# Patient Record
Sex: Female | Born: 1935 | Race: Black or African American | Hispanic: No | Smoking: Former smoker
Health system: Southern US, Community
[De-identification: ages and names within clinical notes are randomized; demographics above are authoritative.]

## PROBLEM LIST (undated history)

## (undated) DIAGNOSIS — I509 Heart failure, unspecified: Secondary | ICD-10-CM

## (undated) DIAGNOSIS — Z7409 Other reduced mobility: Secondary | ICD-10-CM

## (undated) DIAGNOSIS — I4891 Unspecified atrial fibrillation: Secondary | ICD-10-CM

## (undated) DIAGNOSIS — K56609 Unspecified intestinal obstruction, unspecified as to partial versus complete obstruction: Secondary | ICD-10-CM

## (undated) DIAGNOSIS — I350 Nonrheumatic aortic (valve) stenosis: Secondary | ICD-10-CM

## (undated) DIAGNOSIS — D649 Anemia, unspecified: Secondary | ICD-10-CM

## (undated) DIAGNOSIS — I10 Essential (primary) hypertension: Secondary | ICD-10-CM

## (undated) DIAGNOSIS — Z973 Presence of spectacles and contact lenses: Secondary | ICD-10-CM

## (undated) DIAGNOSIS — I2510 Atherosclerotic heart disease of native coronary artery without angina pectoris: Secondary | ICD-10-CM

## (undated) DIAGNOSIS — E119 Type 2 diabetes mellitus without complications: Secondary | ICD-10-CM

## (undated) DIAGNOSIS — I209 Angina pectoris, unspecified: Secondary | ICD-10-CM

## (undated) DIAGNOSIS — Z8719 Personal history of other diseases of the digestive system: Secondary | ICD-10-CM

## (undated) DIAGNOSIS — I639 Cerebral infarction, unspecified: Secondary | ICD-10-CM

## (undated) DIAGNOSIS — Z972 Presence of dental prosthetic device (complete) (partial): Secondary | ICD-10-CM

## (undated) DIAGNOSIS — I6529 Occlusion and stenosis of unspecified carotid artery: Secondary | ICD-10-CM

## (undated) DIAGNOSIS — N189 Chronic kidney disease, unspecified: Secondary | ICD-10-CM

## (undated) DIAGNOSIS — K08109 Complete loss of teeth, unspecified cause, unspecified class: Secondary | ICD-10-CM

## (undated) HISTORY — PX: CHOLECYSTECTOMY: SHX55

## (undated) HISTORY — DX: Occlusion and stenosis of unspecified carotid artery: I65.29

## (undated) HISTORY — PX: TUBAL LIGATION: SHX77

## (undated) HISTORY — PX: BREAST EXCISIONAL BIOPSY: SUR124

## (undated) HISTORY — DX: Anemia, unspecified: D64.9

## (undated) HISTORY — DX: Essential (primary) hypertension: I10

## (undated) HISTORY — PX: ABDOMINAL HYSTERECTOMY: SHX81

## (undated) HISTORY — PX: EYE SURGERY: SHX253

## (undated) HISTORY — PX: CATARACT EXTRACTION: SUR2

## (undated) HISTORY — PX: DILATION AND CURETTAGE OF UTERUS: SHX78

## (undated) HISTORY — PX: APPENDECTOMY: SHX54

## (undated) HISTORY — PX: OTHER SURGICAL HISTORY: SHX169

## (undated) HISTORY — DX: Heart failure, unspecified: I50.9

## (undated) HISTORY — PX: BREAST SURGERY: SHX581

## (undated) HISTORY — DX: Atherosclerotic heart disease of native coronary artery without angina pectoris: I25.10

## (undated) HISTORY — DX: Unspecified atrial fibrillation: I48.91

## (undated) HISTORY — DX: Type 2 diabetes mellitus without complications: E11.9

## (undated) HISTORY — DX: Cerebral infarction, unspecified: I63.9

## (undated) NOTE — *Deleted (*Deleted)
Neurology Consultation Reason for Consult: Dizziness  Requesting Physician: Cherlynn Perches  CC: Dizziness   History is obtained from: Patient and chart review  HPI: Katherine [name] is a 4 y.o. female with a past medical history significant for atrial fibrillation not on anticoagulation, type 2 diabetes, hypertension, hyperlipidemia, obesity, prior stroke with residual right-sided weakness, coronary artery disease, congestive heart failure, aortic stenosis, chronic kidney disease, former tobacco use  She had a recent admission for biopsy of a bladder lesion seen on cystoscopy with fulguration of the abnormal bladder mucosa on 01/24/2020  LKW: *** tPA given?: No, or if yes, time given *** IA performed?: No, or if yes, groin puncture time: *** Premorbid modified rankin scale: ***     0 - No symptoms.     1 - No significant disability. Able to carry out all usual activities, despite some symptoms.     2 - Slight disability. Able to look after own affairs without assistance, but unable to carry out all previous activities.     3 - Moderate disability. Requires some help, but able to walk unassisted.     4 - Moderately severe disability. Unable to attend to own bodily needs without assistance, and unable to walk unassisted.     5 - Severe disability. Requires constant nursing care and attention, bedridden, incontinent.     6 - Dead. ICH Score: ***  Time performed: *** GCS: {Blank single:19197::"3-4 is 2 points","5-12 is 1 point","13-15 is 0 points"} Infratentorial: {yes no:314532}. If yes, 1 point Volume: {Blank single:19197::">30cc is 1 point","<30cc is 0 points"}  Age: 89 y.o.. >80 is 1 point Intraventricular extension is 1 point  Score:***  A Score of {Blank single:19197::"0 points has a 30 day mortality of 0%","1 points has a 30 day mortality of 13%","2 points has a 30 day mortality of 26%","3 points has a 30 day mortality of 72%","4 points has a 30 day mortality of 97%","5-6 points has a 30  day mortality of 100%"}. Stroke. 2001 Apr;32(4):891-7.    ROS: A 14 point ROS was performed and is negative except as noted in the HPI. *** Unable to obtain due to altered mental status.   Past Medical History:  Diagnosis Date  . Anemia    mild  . Anginal pain (HCC)   . Aortic stenosis   . Atrial fibrillation (HCC)   . Bowel obstruction (HCC) 2015   PSBO  . Bowel obstruction (HCC) 10/2017  . CAD (coronary artery disease)   . Carotid artery stenosis   . CHF (congestive heart failure) (HCC)   . Chronic kidney disease    renal insufficiency - DR Lowell Guitar yearly  . Diabetes mellitus without complication (HCC)    type 2  . Full dentures   . History of hiatal hernia   . Hypertension   . Mobility impaired    uses walker for ambulation  . Stroke [facility])    right sided weakness,walker  . Wears glasses    Past Surgical History:  Procedure Laterality Date  . ABDOMINAL HYSTERECTOMY    . angiodysplasia    . APPENDECTOMY    . BREAST DUCTAL SYSTEM EXCISION  03/02/2012   Procedure: EXCISION DUCTAL SYSTEM BREAST;  Surgeon: Ernestene Mention, MD;  Location: [facility];  Service: General;  Laterality: Left;  . BREAST EXCISIONAL BIOPSY    . BREAST SURGERY     bx rt and lt   . CATARACT EXTRACTION     left  . CHOLECYSTECTOMY    .  COLONOSCOPY N/A 10/15/2012   Procedure: COLONOSCOPY;  Surgeon: Theda Belfast, MD;  Location: [facility];  Service: Endoscopy;  Laterality: N/A;  . CYSTOSCOPY WITH BIOPSY N/A 01/24/2020   Procedure: CYSTOSCOPY WITH BLADDER BIOPSY AND FULGERATION;  Surgeon: Noel Christmas, MD;  Location: [facility];  Service: Urology;  Laterality: N/A;  . DILATION AND CURETTAGE OF UTERUS    . DVT    . ENTEROSCOPY N/A 11/17/2014   Procedure: ENTEROSCOPY;  Surgeon: Jeani Hawking, MD;  Location: [facility];  Service: Endoscopy;  Laterality: N/A;  . ESOPHAGOGASTRODUODENOSCOPY N/A 09/10/2012   Procedure: ESOPHAGOGASTRODUODENOSCOPY (EGD);  Surgeon: Theda Belfast, MD;   Location: [facility];  Service: Endoscopy;  Laterality: N/A;  . EYE SURGERY     right-glaucoma  . HOT HEMOSTASIS N/A 09/10/2012   Procedure: HOT HEMOSTASIS (ARGON PLASMA COAGULATION/BICAP);  Surgeon: Theda Belfast, MD;  Location: [facility];  Service: Endoscopy;  Laterality: N/A;  . HOT HEMOSTASIS N/A 11/17/2014   Procedure: HOT HEMOSTASIS (ARGON PLASMA COAGULATION/BICAP);  Surgeon: Jeani Hawking, MD;  Location: [facility];  Service: Endoscopy;  Laterality: N/A;  . TUBAL LIGATION     Current Outpatient Medications  Medication Instructions  . Accu-Chek FastClix Lancets MISC Daily  . ACCU-CHEK GUIDE test strip 1 strip, Daily  . amLODipine-valsartan (EXFORGE) 5-320 MG tablet 1 tablet, Oral, Daily  . atorvastatin (LIPITOR) 40 mg, Oral, Daily  . bimatoprost (LUMIGAN) 0.01 % SOLN 1 drop, Both Eyes, Daily at bedtime  . Blood Glucose Monitoring Suppl (ACCU-CHEK GUIDE) w/Device KIT Daily  . brimonidine (ALPHAGAN P) 0.1 % SOLN 1 drop, Both Eyes, 3 times daily  . cephALEXin (KEFLEX) 250 mg, Oral, Daily at bedtime  . clopidogrel (PLAVIX) 75 mg, Oral, Daily  . dicyclomine (BENTYL) 10 mg, Oral, 3 times daily  . dorzolamide (TRUSOPT) 2 % ophthalmic solution 1 drop, Both Eyes, 3 times daily  . ergocalciferol (VITAMIN D2) 50,000 Units, Oral, Every Sat  . fentaNYL (DURAGESIC) 12 MCG/HR 1 patch, Transdermal, every 72 hours  . ferrous gluconate (FERGON) 324 mg, Oral, Daily  . insulin aspart protamine- aspart (NOVOLOG MIX 70/30) (70-30) 100 UNIT/ML injection 20 Units, Subcutaneous, 2 times daily with meals, Inject 20 units subcutaneously every morning and 10 units at night  . nitroGLYCERIN (NITRODUR - DOSED IN MG/24 HR) 0.4 mg, Transdermal, Daily  . nitroGLYCERIN (NITROSTAT) 0.4 mg, Sublingual, Every 5 min PRN  . omeprazole (PRILOSEC) 40 mg, Oral, Daily  . potassium chloride SA (K-DUR,KLOR-CON) 20 MEQ tablet 10 mEq, Oral, Daily  . torsemide (DEMADEX) 20 mg, Oral, Daily     Family History  Problem  Relation Age of Onset  . Heart disease Sister   . Heart disease Brother   . Heart disease Mother   . Heart disease Father   . Heart disease Daughter    ***  Social History:  reports that she quit smoking about 17 years ago. She has never used smokeless tobacco. She reports that she does not drink alcohol and does not use drugs. ***  Exam: Current vital signs: BP (!) 153/56 (BP Location: Left Arm)   Pulse 62   Temp 98.4 F (36.9 C) (Oral)   Resp 14   SpO2 96%  Vital signs in last 24 hours: Temp:  [98.3 F (36.8 C)-98.4 F (36.9 C)] 98.4 F (36.9 C) (12/01 1903) Pulse Rate:  [57-63] 62 (12/01 2042) Resp:  [14-20] 14 (12/01 2042) BP: (124-161)/(55-79) 153/56 (12/01 2042) SpO2:  [96 %-100 %] 96 % (12/01 2042)   Physical Exam  Constitutional: Appears well-developed and well-nourished.  Psych: Affect appropriate to situation Eyes: No scleral injection HENT: No OP obstrucion MSK: no joint deformities.  Cardiovascular: Normal rate and regular rhythm.  Respiratory: Effort normal, non-labored breathing GI: Soft.  No distension. There is no tenderness.  Skin: WDI  Neuro: Mental Status: Patient is awake, alert, oriented to person, place, month, year, and situation.*** Patient is able to give a clear and coherent history.*** No signs of aphasia or neglect*** Cranial Nerves: II: Visual Fields are full. Pupils are equal, round, and reactive to light.  *** III,IV, VI: EOMI without ptosis or diploplia.  V: Facial sensation is symmetric to temperature VII: Facial movement is symmetric.  VIII: hearing is intact to voice X: Uvula elevates symmetrically XI: Shoulder shrug is symmetric. XII: tongue is midline without atrophy or fasciculations.  Motor: Tone is normal. Bulk is normal. 5/5 strength was present in all four extremities. *** Sensory: Sensation is symmetric to light touch and temperature in the arms and legs.*** Deep Tendon Reflexes: 2+ and symmetric in the biceps and  patellae. *** Plantars: Toes are downgoing bilaterally. *** Cerebellar: FNF and HKS are intact bilaterally***  NIHSS total *** Score breakdown: ***    I have reviewed labs in epic and the results pertinent to this consultation are: ***  I have reviewed the images obtained:***   Impression: ***  Recommendations: - ***   Brooke Dare MD-PhD [facility] [phone]

---

## 1997-06-21 ENCOUNTER — Other Ambulatory Visit: Admission: RE | Admit: 1997-06-21 | Discharge: 1997-06-21 | Payer: Self-pay | Admitting: Cardiology

## 1997-07-04 ENCOUNTER — Inpatient Hospital Stay (HOSPITAL_COMMUNITY): Admission: AD | Admit: 1997-07-04 | Discharge: 1997-07-07 | Payer: Self-pay | Admitting: Urology

## 1997-08-22 ENCOUNTER — Ambulatory Visit (HOSPITAL_COMMUNITY): Admission: RE | Admit: 1997-08-22 | Discharge: 1997-08-22 | Payer: Self-pay | Admitting: Gastroenterology

## 1997-08-24 ENCOUNTER — Other Ambulatory Visit: Admission: RE | Admit: 1997-08-24 | Discharge: 1997-08-24 | Payer: Self-pay | Admitting: Cardiology

## 1997-08-27 ENCOUNTER — Inpatient Hospital Stay (HOSPITAL_COMMUNITY): Admission: EM | Admit: 1997-08-27 | Discharge: 1997-08-29 | Payer: Self-pay | Admitting: Emergency Medicine

## 1997-09-04 ENCOUNTER — Other Ambulatory Visit: Admission: RE | Admit: 1997-09-04 | Discharge: 1997-09-04 | Payer: Self-pay | Admitting: Cardiology

## 1997-10-22 ENCOUNTER — Inpatient Hospital Stay (HOSPITAL_COMMUNITY): Admission: EM | Admit: 1997-10-22 | Discharge: 1997-10-26 | Payer: Self-pay | Admitting: Emergency Medicine

## 1997-10-22 ENCOUNTER — Encounter: Payer: Self-pay | Admitting: Emergency Medicine

## 1997-11-13 ENCOUNTER — Other Ambulatory Visit: Admission: RE | Admit: 1997-11-13 | Discharge: 1997-11-13 | Payer: Self-pay | Admitting: Obstetrics

## 1997-11-28 ENCOUNTER — Encounter: Payer: Self-pay | Admitting: Urology

## 1997-11-30 ENCOUNTER — Ambulatory Visit (HOSPITAL_COMMUNITY): Admission: RE | Admit: 1997-11-30 | Discharge: 1997-11-30 | Payer: Self-pay | Admitting: Urology

## 1998-02-15 ENCOUNTER — Ambulatory Visit (HOSPITAL_COMMUNITY): Admission: RE | Admit: 1998-02-15 | Discharge: 1998-02-15 | Payer: Self-pay | Admitting: Cardiology

## 1998-02-15 ENCOUNTER — Encounter: Payer: Self-pay | Admitting: Cardiology

## 1998-02-28 ENCOUNTER — Encounter: Payer: Self-pay | Admitting: Emergency Medicine

## 1998-03-01 ENCOUNTER — Encounter: Payer: Self-pay | Admitting: Emergency Medicine

## 1998-03-01 ENCOUNTER — Inpatient Hospital Stay (HOSPITAL_COMMUNITY): Admission: EM | Admit: 1998-03-01 | Discharge: 1998-03-09 | Payer: Self-pay | Admitting: Emergency Medicine

## 1998-03-02 ENCOUNTER — Encounter: Payer: Self-pay | Admitting: Cardiology

## 1998-03-02 ENCOUNTER — Encounter: Payer: Self-pay | Admitting: *Deleted

## 1998-03-07 ENCOUNTER — Encounter: Payer: Self-pay | Admitting: Emergency Medicine

## 1998-04-19 ENCOUNTER — Ambulatory Visit (HOSPITAL_COMMUNITY): Admission: RE | Admit: 1998-04-19 | Discharge: 1998-04-19 | Payer: Self-pay | Admitting: Urology

## 1998-06-02 ENCOUNTER — Encounter: Payer: Self-pay | Admitting: Emergency Medicine

## 1998-06-02 ENCOUNTER — Inpatient Hospital Stay (HOSPITAL_COMMUNITY): Admission: EM | Admit: 1998-06-02 | Discharge: 1998-06-06 | Payer: Self-pay | Admitting: Emergency Medicine

## 1998-06-03 ENCOUNTER — Encounter (HOSPITAL_BASED_OUTPATIENT_CLINIC_OR_DEPARTMENT_OTHER): Payer: Self-pay | Admitting: General Surgery

## 1998-06-06 ENCOUNTER — Encounter (HOSPITAL_BASED_OUTPATIENT_CLINIC_OR_DEPARTMENT_OTHER): Payer: Self-pay | Admitting: General Surgery

## 1998-07-11 ENCOUNTER — Ambulatory Visit (HOSPITAL_COMMUNITY): Admission: RE | Admit: 1998-07-11 | Discharge: 1998-07-11 | Payer: Self-pay | Admitting: *Deleted

## 1998-07-12 ENCOUNTER — Ambulatory Visit (HOSPITAL_COMMUNITY): Admission: RE | Admit: 1998-07-12 | Discharge: 1998-07-12 | Payer: Self-pay | Admitting: *Deleted

## 1998-08-02 ENCOUNTER — Ambulatory Visit (HOSPITAL_COMMUNITY): Admission: RE | Admit: 1998-08-02 | Discharge: 1998-08-02 | Payer: Self-pay | Admitting: Urology

## 1998-09-25 ENCOUNTER — Inpatient Hospital Stay (HOSPITAL_COMMUNITY): Admission: EM | Admit: 1998-09-25 | Discharge: 1998-09-30 | Payer: Self-pay | Admitting: Emergency Medicine

## 1998-09-25 ENCOUNTER — Encounter: Payer: Self-pay | Admitting: Emergency Medicine

## 1998-09-25 ENCOUNTER — Encounter (HOSPITAL_COMMUNITY): Admission: RE | Admit: 1998-09-25 | Discharge: 1998-10-01 | Payer: Self-pay | Admitting: Cardiology

## 1998-09-26 ENCOUNTER — Encounter: Payer: Self-pay | Admitting: Emergency Medicine

## 1998-09-26 ENCOUNTER — Encounter: Payer: Self-pay | Admitting: Cardiology

## 1998-10-15 ENCOUNTER — Encounter (HOSPITAL_COMMUNITY): Admission: RE | Admit: 1998-10-15 | Discharge: 1999-01-13 | Payer: Self-pay | Admitting: Cardiology

## 1998-12-07 ENCOUNTER — Encounter: Payer: Self-pay | Admitting: Emergency Medicine

## 1998-12-07 ENCOUNTER — Inpatient Hospital Stay (HOSPITAL_COMMUNITY): Admission: EM | Admit: 1998-12-07 | Discharge: 1998-12-10 | Payer: Self-pay | Admitting: Emergency Medicine

## 1998-12-09 ENCOUNTER — Encounter: Payer: Self-pay | Admitting: Cardiology

## 1999-01-16 ENCOUNTER — Encounter (HOSPITAL_COMMUNITY): Admission: RE | Admit: 1999-01-16 | Discharge: 1999-04-16 | Payer: Self-pay | Admitting: Cardiology

## 1999-01-16 ENCOUNTER — Other Ambulatory Visit: Admission: RE | Admit: 1999-01-16 | Discharge: 1999-01-16 | Payer: Self-pay | Admitting: Obstetrics

## 1999-01-18 ENCOUNTER — Encounter: Payer: Self-pay | Admitting: Obstetrics

## 1999-01-18 ENCOUNTER — Ambulatory Visit (HOSPITAL_COMMUNITY): Admission: RE | Admit: 1999-01-18 | Discharge: 1999-01-18 | Payer: Self-pay | Admitting: Obstetrics

## 1999-01-31 ENCOUNTER — Ambulatory Visit (HOSPITAL_COMMUNITY): Admission: RE | Admit: 1999-01-31 | Discharge: 1999-01-31 | Payer: Self-pay | Admitting: Urology

## 1999-02-12 ENCOUNTER — Ambulatory Visit (HOSPITAL_COMMUNITY): Admission: RE | Admit: 1999-02-12 | Discharge: 1999-02-12 | Payer: Self-pay | Admitting: Cardiology

## 1999-02-12 ENCOUNTER — Encounter: Payer: Self-pay | Admitting: Cardiology

## 1999-03-26 ENCOUNTER — Emergency Department (HOSPITAL_COMMUNITY): Admission: EM | Admit: 1999-03-26 | Discharge: 1999-03-26 | Payer: Self-pay | Admitting: Emergency Medicine

## 1999-05-23 ENCOUNTER — Encounter (HOSPITAL_COMMUNITY): Admission: RE | Admit: 1999-05-23 | Discharge: 1999-08-21 | Payer: Self-pay | Admitting: Cardiology

## 1999-05-29 ENCOUNTER — Ambulatory Visit (HOSPITAL_COMMUNITY): Admission: RE | Admit: 1999-05-29 | Discharge: 1999-05-29 | Payer: Self-pay | Admitting: Urology

## 1999-06-17 ENCOUNTER — Encounter: Payer: Self-pay | Admitting: Cardiology

## 1999-06-17 ENCOUNTER — Ambulatory Visit (HOSPITAL_COMMUNITY): Admission: RE | Admit: 1999-06-17 | Discharge: 1999-06-17 | Payer: Self-pay | Admitting: Cardiology

## 1999-07-13 ENCOUNTER — Ambulatory Visit (HOSPITAL_COMMUNITY): Admission: RE | Admit: 1999-07-13 | Discharge: 1999-07-13 | Payer: Self-pay | Admitting: Neurology

## 1999-07-13 ENCOUNTER — Encounter: Payer: Self-pay | Admitting: Neurology

## 1999-07-16 ENCOUNTER — Ambulatory Visit (HOSPITAL_COMMUNITY): Admission: RE | Admit: 1999-07-16 | Discharge: 1999-07-16 | Payer: Self-pay | Admitting: Neurology

## 1999-08-22 ENCOUNTER — Encounter (HOSPITAL_COMMUNITY): Admission: RE | Admit: 1999-08-22 | Discharge: 1999-11-20 | Payer: Self-pay | Admitting: Neurology

## 1999-10-11 ENCOUNTER — Encounter: Payer: Self-pay | Admitting: Urology

## 1999-10-16 ENCOUNTER — Ambulatory Visit (HOSPITAL_COMMUNITY): Admission: RE | Admit: 1999-10-16 | Discharge: 1999-10-16 | Payer: Self-pay | Admitting: Urology

## 1999-11-05 ENCOUNTER — Emergency Department (HOSPITAL_COMMUNITY): Admission: EM | Admit: 1999-11-05 | Discharge: 1999-11-05 | Payer: Self-pay | Admitting: *Deleted

## 1999-12-08 ENCOUNTER — Encounter: Payer: Self-pay | Admitting: Emergency Medicine

## 1999-12-08 ENCOUNTER — Emergency Department (HOSPITAL_COMMUNITY): Admission: EM | Admit: 1999-12-08 | Discharge: 1999-12-08 | Payer: Self-pay | Admitting: Emergency Medicine

## 2000-01-27 ENCOUNTER — Encounter (HOSPITAL_COMMUNITY): Admission: RE | Admit: 2000-01-27 | Discharge: 2000-04-26 | Payer: Self-pay | Admitting: Neurology

## 2000-02-17 ENCOUNTER — Ambulatory Visit (HOSPITAL_COMMUNITY): Admission: RE | Admit: 2000-02-17 | Discharge: 2000-02-17 | Payer: Self-pay | Admitting: Cardiology

## 2000-02-17 ENCOUNTER — Encounter: Payer: Self-pay | Admitting: Cardiology

## 2000-05-19 ENCOUNTER — Encounter (HOSPITAL_COMMUNITY): Admission: RE | Admit: 2000-05-19 | Discharge: 2000-08-17 | Payer: Self-pay | Admitting: Neurology

## 2000-06-02 ENCOUNTER — Ambulatory Visit (HOSPITAL_COMMUNITY): Admission: RE | Admit: 2000-06-02 | Discharge: 2000-06-02 | Payer: Self-pay | Admitting: Cardiology

## 2000-06-02 ENCOUNTER — Encounter: Payer: Self-pay | Admitting: Cardiology

## 2000-06-11 ENCOUNTER — Inpatient Hospital Stay (HOSPITAL_COMMUNITY): Admission: EM | Admit: 2000-06-11 | Discharge: 2000-06-13 | Payer: Self-pay | Admitting: Emergency Medicine

## 2000-06-11 ENCOUNTER — Encounter: Payer: Self-pay | Admitting: Emergency Medicine

## 2000-06-12 ENCOUNTER — Encounter: Payer: Self-pay | Admitting: Cardiology

## 2000-06-19 ENCOUNTER — Ambulatory Visit (HOSPITAL_COMMUNITY): Admission: RE | Admit: 2000-06-19 | Discharge: 2000-06-19 | Payer: Self-pay | Admitting: Urology

## 2000-07-27 ENCOUNTER — Encounter: Payer: Self-pay | Admitting: Emergency Medicine

## 2000-07-28 ENCOUNTER — Inpatient Hospital Stay (HOSPITAL_COMMUNITY): Admission: EM | Admit: 2000-07-28 | Discharge: 2000-08-01 | Payer: Self-pay | Admitting: Emergency Medicine

## 2000-08-28 ENCOUNTER — Encounter: Admission: RE | Admit: 2000-08-28 | Discharge: 2000-11-26 | Payer: Self-pay | Admitting: Neurology

## 2000-08-29 ENCOUNTER — Emergency Department (HOSPITAL_COMMUNITY): Admission: EM | Admit: 2000-08-29 | Discharge: 2000-08-29 | Payer: Self-pay | Admitting: Emergency Medicine

## 2000-08-29 ENCOUNTER — Encounter: Payer: Self-pay | Admitting: Emergency Medicine

## 2000-11-13 ENCOUNTER — Encounter: Payer: Self-pay | Admitting: Cardiology

## 2000-11-13 ENCOUNTER — Ambulatory Visit (HOSPITAL_COMMUNITY): Admission: RE | Admit: 2000-11-13 | Discharge: 2000-11-13 | Payer: Self-pay | Admitting: Cardiology

## 2000-12-22 ENCOUNTER — Encounter (HOSPITAL_COMMUNITY): Admission: RE | Admit: 2000-12-22 | Discharge: 2001-03-22 | Payer: Self-pay | Admitting: Cardiology

## 2001-02-19 ENCOUNTER — Encounter: Payer: Self-pay | Admitting: Cardiology

## 2001-02-19 ENCOUNTER — Ambulatory Visit (HOSPITAL_COMMUNITY): Admission: RE | Admit: 2001-02-19 | Discharge: 2001-02-19 | Payer: Self-pay | Admitting: Cardiology

## 2001-03-07 ENCOUNTER — Inpatient Hospital Stay (HOSPITAL_COMMUNITY): Admission: EM | Admit: 2001-03-07 | Discharge: 2001-03-10 | Payer: Self-pay | Admitting: Emergency Medicine

## 2001-03-07 ENCOUNTER — Encounter: Payer: Self-pay | Admitting: Emergency Medicine

## 2001-03-07 ENCOUNTER — Encounter: Payer: Self-pay | Admitting: Cardiovascular Disease

## 2001-03-09 ENCOUNTER — Encounter: Payer: Self-pay | Admitting: Cardiovascular Disease

## 2001-05-13 ENCOUNTER — Ambulatory Visit (HOSPITAL_COMMUNITY): Admission: RE | Admit: 2001-05-13 | Discharge: 2001-05-13 | Payer: Self-pay | Admitting: Urology

## 2001-09-15 ENCOUNTER — Emergency Department (HOSPITAL_COMMUNITY): Admission: EM | Admit: 2001-09-15 | Discharge: 2001-09-15 | Payer: Self-pay | Admitting: *Deleted

## 2001-09-15 ENCOUNTER — Encounter: Payer: Self-pay | Admitting: *Deleted

## 2001-10-11 ENCOUNTER — Emergency Department (HOSPITAL_COMMUNITY): Admission: EM | Admit: 2001-10-11 | Discharge: 2001-10-11 | Payer: Self-pay

## 2001-11-02 ENCOUNTER — Ambulatory Visit (HOSPITAL_BASED_OUTPATIENT_CLINIC_OR_DEPARTMENT_OTHER): Admission: RE | Admit: 2001-11-02 | Discharge: 2001-11-02 | Payer: Self-pay | Admitting: Urology

## 2001-12-04 ENCOUNTER — Encounter: Payer: Self-pay | Admitting: Emergency Medicine

## 2001-12-04 ENCOUNTER — Inpatient Hospital Stay (HOSPITAL_COMMUNITY): Admission: EM | Admit: 2001-12-04 | Discharge: 2001-12-14 | Payer: Self-pay | Admitting: Emergency Medicine

## 2001-12-04 ENCOUNTER — Encounter: Payer: Self-pay | Admitting: Cardiology

## 2001-12-05 ENCOUNTER — Encounter: Payer: Self-pay | Admitting: Cardiology

## 2001-12-06 ENCOUNTER — Encounter (INDEPENDENT_AMBULATORY_CARE_PROVIDER_SITE_OTHER): Payer: Self-pay | Admitting: Cardiology

## 2001-12-06 ENCOUNTER — Encounter: Payer: Self-pay | Admitting: Cardiology

## 2001-12-07 ENCOUNTER — Encounter: Payer: Self-pay | Admitting: Cardiology

## 2002-01-13 ENCOUNTER — Encounter: Payer: Self-pay | Admitting: Internal Medicine

## 2002-01-13 ENCOUNTER — Ambulatory Visit (HOSPITAL_COMMUNITY): Admission: RE | Admit: 2002-01-13 | Discharge: 2002-01-13 | Payer: Self-pay | Admitting: Internal Medicine

## 2002-02-03 ENCOUNTER — Encounter: Payer: Self-pay | Admitting: Vascular Surgery

## 2002-02-07 ENCOUNTER — Inpatient Hospital Stay (HOSPITAL_COMMUNITY): Admission: RE | Admit: 2002-02-07 | Discharge: 2002-02-11 | Payer: Self-pay | Admitting: Vascular Surgery

## 2002-02-07 ENCOUNTER — Encounter (INDEPENDENT_AMBULATORY_CARE_PROVIDER_SITE_OTHER): Payer: Self-pay | Admitting: Specialist

## 2002-02-07 ENCOUNTER — Encounter: Payer: Self-pay | Admitting: Vascular Surgery

## 2002-03-22 ENCOUNTER — Ambulatory Visit (HOSPITAL_COMMUNITY): Admission: RE | Admit: 2002-03-22 | Discharge: 2002-03-22 | Payer: Self-pay | Admitting: Cardiology

## 2002-03-22 ENCOUNTER — Encounter: Payer: Self-pay | Admitting: Cardiology

## 2002-03-28 ENCOUNTER — Emergency Department (HOSPITAL_COMMUNITY): Admission: EM | Admit: 2002-03-28 | Discharge: 2002-03-28 | Payer: Self-pay | Admitting: Emergency Medicine

## 2002-03-28 ENCOUNTER — Encounter: Payer: Self-pay | Admitting: Emergency Medicine

## 2002-05-17 ENCOUNTER — Ambulatory Visit (HOSPITAL_COMMUNITY): Admission: RE | Admit: 2002-05-17 | Discharge: 2002-05-17 | Payer: Self-pay | Admitting: Urology

## 2002-07-05 ENCOUNTER — Inpatient Hospital Stay (HOSPITAL_COMMUNITY): Admission: EM | Admit: 2002-07-05 | Discharge: 2002-07-12 | Payer: Self-pay | Admitting: Emergency Medicine

## 2002-07-05 ENCOUNTER — Encounter: Payer: Self-pay | Admitting: Emergency Medicine

## 2002-07-06 ENCOUNTER — Encounter: Payer: Self-pay | Admitting: Cardiology

## 2002-07-07 ENCOUNTER — Encounter: Payer: Self-pay | Admitting: Cardiology

## 2002-07-08 ENCOUNTER — Encounter: Payer: Self-pay | Admitting: Cardiology

## 2002-07-29 ENCOUNTER — Encounter: Payer: Self-pay | Admitting: *Deleted

## 2002-07-29 ENCOUNTER — Emergency Department (HOSPITAL_COMMUNITY): Admission: EM | Admit: 2002-07-29 | Discharge: 2002-07-29 | Payer: Self-pay

## 2002-08-07 ENCOUNTER — Encounter: Payer: Self-pay | Admitting: Emergency Medicine

## 2002-08-08 ENCOUNTER — Inpatient Hospital Stay (HOSPITAL_COMMUNITY): Admission: EM | Admit: 2002-08-08 | Discharge: 2002-08-12 | Payer: Self-pay | Admitting: Emergency Medicine

## 2002-08-08 ENCOUNTER — Encounter: Payer: Self-pay | Admitting: Cardiology

## 2002-10-26 ENCOUNTER — Ambulatory Visit (HOSPITAL_COMMUNITY): Admission: RE | Admit: 2002-10-26 | Discharge: 2002-10-26 | Payer: Self-pay | Admitting: Cardiology

## 2003-05-12 ENCOUNTER — Ambulatory Visit (HOSPITAL_COMMUNITY): Admission: RE | Admit: 2003-05-12 | Discharge: 2003-05-12 | Payer: Self-pay | Admitting: Urology

## 2003-11-03 ENCOUNTER — Emergency Department (HOSPITAL_COMMUNITY): Admission: EM | Admit: 2003-11-03 | Discharge: 2003-11-03 | Payer: Self-pay

## 2003-12-07 ENCOUNTER — Ambulatory Visit (HOSPITAL_COMMUNITY): Admission: RE | Admit: 2003-12-07 | Discharge: 2003-12-08 | Payer: Self-pay | Admitting: Ophthalmology

## 2004-02-05 ENCOUNTER — Ambulatory Visit (HOSPITAL_COMMUNITY): Admission: RE | Admit: 2004-02-05 | Discharge: 2004-02-05 | Payer: Self-pay | Admitting: Urology

## 2004-05-06 ENCOUNTER — Encounter: Admission: RE | Admit: 2004-05-06 | Discharge: 2004-05-06 | Payer: Self-pay | Admitting: Cardiology

## 2004-08-07 ENCOUNTER — Ambulatory Visit (HOSPITAL_COMMUNITY): Admission: RE | Admit: 2004-08-07 | Discharge: 2004-08-07 | Payer: Self-pay | Admitting: Cardiology

## 2004-09-19 ENCOUNTER — Ambulatory Visit (HOSPITAL_COMMUNITY): Admission: RE | Admit: 2004-09-19 | Discharge: 2004-09-19 | Payer: Self-pay | Admitting: Cardiology

## 2004-10-29 ENCOUNTER — Ambulatory Visit (HOSPITAL_COMMUNITY): Admission: RE | Admit: 2004-10-29 | Discharge: 2004-10-29 | Payer: Self-pay | Admitting: Cardiology

## 2004-11-23 ENCOUNTER — Emergency Department (HOSPITAL_COMMUNITY): Admission: EM | Admit: 2004-11-23 | Discharge: 2004-11-23 | Payer: Self-pay | Admitting: Emergency Medicine

## 2005-03-11 ENCOUNTER — Inpatient Hospital Stay (HOSPITAL_COMMUNITY): Admission: EM | Admit: 2005-03-11 | Discharge: 2005-03-30 | Payer: Self-pay | Admitting: Emergency Medicine

## 2005-03-12 ENCOUNTER — Ambulatory Visit: Payer: Self-pay | Admitting: Gastroenterology

## 2005-06-26 ENCOUNTER — Ambulatory Visit (HOSPITAL_COMMUNITY): Admission: RE | Admit: 2005-06-26 | Discharge: 2005-06-26 | Payer: Self-pay | Admitting: Cardiology

## 2005-07-14 ENCOUNTER — Encounter: Admission: RE | Admit: 2005-07-14 | Discharge: 2005-07-14 | Payer: Self-pay | Admitting: Cardiology

## 2005-10-09 ENCOUNTER — Emergency Department (HOSPITAL_COMMUNITY): Admission: EM | Admit: 2005-10-09 | Discharge: 2005-10-09 | Payer: Self-pay | Admitting: Emergency Medicine

## 2005-10-24 ENCOUNTER — Ambulatory Visit (HOSPITAL_COMMUNITY): Admission: RE | Admit: 2005-10-24 | Discharge: 2005-10-25 | Payer: Self-pay | Admitting: Urology

## 2006-03-06 ENCOUNTER — Ambulatory Visit: Payer: Self-pay | Admitting: Gastroenterology

## 2006-03-12 ENCOUNTER — Ambulatory Visit (HOSPITAL_COMMUNITY): Admission: RE | Admit: 2006-03-12 | Discharge: 2006-03-12 | Payer: Self-pay | Admitting: Gastroenterology

## 2006-03-18 ENCOUNTER — Ambulatory Visit: Payer: Self-pay | Admitting: Gastroenterology

## 2006-04-08 ENCOUNTER — Ambulatory Visit: Payer: Self-pay | Admitting: Gastroenterology

## 2006-04-08 LAB — CONVERTED CEMR LAB
ALT: 13 units/L (ref 0–40)
AST: 21 units/L (ref 0–37)
Basophils Absolute: 0.1 10*3/uL (ref 0.0–0.1)
Bilirubin, Direct: 0.1 mg/dL (ref 0.0–0.3)
Chloride: 106 meq/L (ref 96–112)
Hemoglobin: 10.2 g/dL — ABNORMAL LOW (ref 12.0–15.0)
Lymphocytes Relative: 45.5 % (ref 12.0–46.0)
MCHC: 33.9 g/dL (ref 30.0–36.0)
MCV: 84.2 fL (ref 78.0–100.0)
Monocytes Absolute: 0.4 10*3/uL (ref 0.2–0.7)
Monocytes Relative: 7.5 % (ref 3.0–11.0)
Neutro Abs: 2.2 10*3/uL (ref 1.4–7.7)
Neutrophils Relative %: 43.3 % (ref 43.0–77.0)
Total Bilirubin: 0.4 mg/dL (ref 0.3–1.2)

## 2006-05-11 ENCOUNTER — Encounter: Admission: RE | Admit: 2006-05-11 | Discharge: 2006-05-11 | Payer: Self-pay | Admitting: Cardiology

## 2006-06-29 ENCOUNTER — Encounter: Admission: RE | Admit: 2006-06-29 | Discharge: 2006-06-29 | Payer: Self-pay | Admitting: Cardiology

## 2006-10-01 ENCOUNTER — Ambulatory Visit (HOSPITAL_COMMUNITY): Admission: RE | Admit: 2006-10-01 | Discharge: 2006-10-01 | Payer: Self-pay | Admitting: Cardiology

## 2006-11-30 ENCOUNTER — Ambulatory Visit: Payer: Self-pay | Admitting: Gastroenterology

## 2007-06-21 ENCOUNTER — Emergency Department (HOSPITAL_COMMUNITY): Admission: EM | Admit: 2007-06-21 | Discharge: 2007-06-21 | Payer: Self-pay | Admitting: Emergency Medicine

## 2007-07-01 ENCOUNTER — Encounter: Admission: RE | Admit: 2007-07-01 | Discharge: 2007-07-01 | Payer: Self-pay | Admitting: Cardiology

## 2007-07-12 ENCOUNTER — Encounter: Admission: RE | Admit: 2007-07-12 | Discharge: 2007-07-12 | Payer: Self-pay | Admitting: Cardiology

## 2007-09-13 ENCOUNTER — Inpatient Hospital Stay (HOSPITAL_COMMUNITY): Admission: EM | Admit: 2007-09-13 | Discharge: 2007-09-24 | Payer: Self-pay | Admitting: Emergency Medicine

## 2007-09-15 ENCOUNTER — Encounter (INDEPENDENT_AMBULATORY_CARE_PROVIDER_SITE_OTHER): Payer: Self-pay | Admitting: Cardiology

## 2007-09-15 ENCOUNTER — Ambulatory Visit: Payer: Self-pay | Admitting: Vascular Surgery

## 2007-09-21 ENCOUNTER — Encounter (INDEPENDENT_AMBULATORY_CARE_PROVIDER_SITE_OTHER): Payer: Self-pay | Admitting: Cardiology

## 2007-09-22 ENCOUNTER — Encounter (INDEPENDENT_AMBULATORY_CARE_PROVIDER_SITE_OTHER): Payer: Self-pay | Admitting: Cardiology

## 2007-11-04 ENCOUNTER — Ambulatory Visit (HOSPITAL_COMMUNITY): Admission: RE | Admit: 2007-11-04 | Discharge: 2007-11-04 | Payer: Self-pay | Admitting: Cardiology

## 2007-12-16 ENCOUNTER — Ambulatory Visit (HOSPITAL_COMMUNITY): Admission: RE | Admit: 2007-12-16 | Discharge: 2007-12-16 | Payer: Self-pay | Admitting: Cardiology

## 2007-12-16 ENCOUNTER — Encounter (INDEPENDENT_AMBULATORY_CARE_PROVIDER_SITE_OTHER): Payer: Self-pay | Admitting: Cardiology

## 2007-12-16 ENCOUNTER — Ambulatory Visit: Payer: Self-pay | Admitting: Surgery

## 2008-05-15 ENCOUNTER — Emergency Department (HOSPITAL_COMMUNITY): Admission: EM | Admit: 2008-05-15 | Discharge: 2008-05-15 | Payer: Self-pay | Admitting: Emergency Medicine

## 2008-07-12 ENCOUNTER — Encounter: Admission: RE | Admit: 2008-07-12 | Discharge: 2008-07-12 | Payer: Self-pay | Admitting: Cardiology

## 2009-01-22 ENCOUNTER — Encounter: Admission: RE | Admit: 2009-01-22 | Discharge: 2009-01-22 | Payer: Self-pay | Admitting: Cardiology

## 2009-02-08 ENCOUNTER — Ambulatory Visit (HOSPITAL_COMMUNITY): Admission: RE | Admit: 2009-02-08 | Discharge: 2009-02-08 | Payer: Self-pay | Admitting: Cardiology

## 2009-06-22 ENCOUNTER — Encounter: Admission: RE | Admit: 2009-06-22 | Discharge: 2009-06-22 | Payer: Self-pay | Admitting: Cardiology

## 2009-07-13 ENCOUNTER — Encounter: Admission: RE | Admit: 2009-07-13 | Discharge: 2009-07-13 | Payer: Self-pay | Admitting: Cardiology

## 2009-11-23 ENCOUNTER — Emergency Department (HOSPITAL_COMMUNITY): Admission: EM | Admit: 2009-11-23 | Discharge: 2009-11-23 | Payer: Self-pay | Admitting: Emergency Medicine

## 2009-11-23 ENCOUNTER — Encounter (INDEPENDENT_AMBULATORY_CARE_PROVIDER_SITE_OTHER): Payer: Self-pay | Admitting: Emergency Medicine

## 2009-11-23 ENCOUNTER — Ambulatory Visit: Payer: Self-pay | Admitting: Vascular Surgery

## 2009-12-13 ENCOUNTER — Encounter: Admission: RE | Admit: 2009-12-13 | Discharge: 2009-12-13 | Payer: Self-pay | Admitting: Cardiology

## 2010-03-24 ENCOUNTER — Encounter: Payer: Self-pay | Admitting: Cardiology

## 2010-05-16 LAB — DIFFERENTIAL
Basophils Absolute: 0 10*3/uL (ref 0.0–0.1)
Eosinophils Absolute: 0.1 10*3/uL (ref 0.0–0.7)
Lymphocytes Relative: 30 % (ref 12–46)
Lymphs Abs: 1.7 10*3/uL (ref 0.7–4.0)
Monocytes Relative: 14 % — ABNORMAL HIGH (ref 3–12)
Neutro Abs: 2.9 10*3/uL (ref 1.7–7.7)

## 2010-05-16 LAB — POCT I-STAT, CHEM 8
Calcium, Ion: 1.26 mmol/L (ref 1.12–1.32)
Chloride: 107 mEq/L (ref 96–112)
Glucose, Bld: 90 mg/dL (ref 70–99)
HCT: 33 % — ABNORMAL LOW (ref 36.0–46.0)
Hemoglobin: 11.2 g/dL — ABNORMAL LOW (ref 12.0–15.0)

## 2010-05-16 LAB — CBC
MCHC: 32 g/dL (ref 30.0–36.0)
MCV: 87.8 fL (ref 78.0–100.0)
Platelets: 269 10*3/uL (ref 150–400)
RDW: 14.9 % (ref 11.5–15.5)
WBC: 5.5 10*3/uL (ref 4.0–10.5)

## 2010-05-28 ENCOUNTER — Inpatient Hospital Stay (HOSPITAL_COMMUNITY)
Admission: EM | Admit: 2010-05-28 | Discharge: 2010-06-05 | DRG: 195 | Disposition: A | Discharge status: Home / Self Care | Payer: Medicare Other | Attending: Cardiology | Admitting: Cardiology

## 2010-05-28 ENCOUNTER — Emergency Department (HOSPITAL_COMMUNITY): Payer: Medicare Other

## 2010-05-28 DIAGNOSIS — I10 Essential (primary) hypertension: Secondary | ICD-10-CM | POA: Diagnosis present

## 2010-05-28 DIAGNOSIS — J189 Pneumonia, unspecified organism: Principal | ICD-10-CM | POA: Diagnosis present

## 2010-05-28 DIAGNOSIS — M109 Gout, unspecified: Secondary | ICD-10-CM | POA: Diagnosis not present

## 2010-05-28 DIAGNOSIS — I2510 Atherosclerotic heart disease of native coronary artery without angina pectoris: Secondary | ICD-10-CM | POA: Diagnosis present

## 2010-05-28 DIAGNOSIS — Z880 Allergy status to penicillin: Secondary | ICD-10-CM

## 2010-05-28 DIAGNOSIS — IMO0001 Reserved for inherently not codable concepts without codable children: Secondary | ICD-10-CM | POA: Diagnosis present

## 2010-05-28 DIAGNOSIS — R195 Other fecal abnormalities: Secondary | ICD-10-CM | POA: Diagnosis not present

## 2010-05-28 DIAGNOSIS — Z86718 Personal history of other venous thrombosis and embolism: Secondary | ICD-10-CM

## 2010-05-28 DIAGNOSIS — D649 Anemia, unspecified: Secondary | ICD-10-CM | POA: Diagnosis present

## 2010-05-28 DIAGNOSIS — M7989 Other specified soft tissue disorders: Secondary | ICD-10-CM

## 2010-05-28 DIAGNOSIS — K5520 Angiodysplasia of colon without hemorrhage: Secondary | ICD-10-CM | POA: Diagnosis present

## 2010-05-28 LAB — CK TOTAL AND CKMB (NOT AT ARMC)
CK, MB: 2.1 ng/mL (ref 0.3–4.0)
Relative Index: 1.8 (ref 0.0–2.5)
Total CK: 119 U/L (ref 7–177)

## 2010-05-28 LAB — DIFFERENTIAL
Basophils Absolute: 0 10*3/uL (ref 0.0–0.1)
Basophils Relative: 1 % (ref 0–1)
Eosinophils Absolute: 0.2 10*3/uL (ref 0.0–0.7)
Eosinophils Relative: 4 % (ref 0–5)
Monocytes Absolute: 0.4 10*3/uL (ref 0.1–1.0)

## 2010-05-28 LAB — POCT CARDIAC MARKERS
CKMB, poc: 1.8 ng/mL (ref 1.0–8.0)
Myoglobin, poc: 114 ng/mL (ref 12–200)
Troponin i, poc: <0.05 ng/mL (ref 0.00–0.09)

## 2010-05-28 LAB — POCT I-STAT, CHEM 8
BUN: 39 mg/dL — ABNORMAL HIGH (ref 6–23)
Calcium, Ion: 1.1 mmol/L — ABNORMAL LOW (ref 1.12–1.32)
HCT: 31 % — ABNORMAL LOW (ref 36.0–46.0)
Hemoglobin: 10.5 g/dL — ABNORMAL LOW (ref 12.0–15.0)
Sodium: 138 mEq/L (ref 135–145)
TCO2: 19 mmol/L (ref 0–100)

## 2010-05-28 LAB — CBC
HCT: 29.7 % — ABNORMAL LOW (ref 36.0–46.0)
MCHC: 32 g/dL (ref 30.0–36.0)
Platelets: 291 10*3/uL (ref 150–400)
RDW: 14.1 % (ref 11.5–15.5)
WBC: 4.7 10*3/uL (ref 4.0–10.5)

## 2010-05-29 ENCOUNTER — Other Ambulatory Visit (HOSPITAL_COMMUNITY): Payer: Medicare Other

## 2010-05-29 ENCOUNTER — Inpatient Hospital Stay (HOSPITAL_COMMUNITY): Payer: Medicare Other

## 2010-05-29 DIAGNOSIS — M79609 Pain in unspecified limb: Secondary | ICD-10-CM

## 2010-05-29 LAB — BRAIN NATRIURETIC PEPTIDE: Pro B Natriuretic peptide (BNP): <30 pg/mL (ref 0.0–100.0)

## 2010-05-29 LAB — HEMOGLOBIN A1C
Hgb A1c MFr Bld: 6.4 % — ABNORMAL HIGH (ref <5.7)
Mean Plasma Glucose: 137 mg/dL — ABNORMAL HIGH (ref <117)

## 2010-05-29 LAB — LIPID PANEL
LDL Cholesterol: 210 mg/dL — ABNORMAL HIGH (ref 0–99)
VLDL: 20 mg/dL (ref 0–40)

## 2010-05-29 LAB — IRON AND TIBC
Saturation Ratios: 21 % (ref 20–55)
TIBC: 277 ug/dL (ref 250–470)
UIBC: 219 ug/dL

## 2010-05-29 LAB — CARDIAC PANEL(CRET KIN+CKTOT+MB+TROPI)
CK, MB: 2.5 ng/mL (ref 0.3–4.0)
Relative Index: 1.9 (ref 0.0–2.5)
Relative Index: 1.9 (ref 0.0–2.5)
Total CK: 131 U/L (ref 7–177)

## 2010-05-29 LAB — GLUCOSE, CAPILLARY
Glucose-Capillary: 111 mg/dL — ABNORMAL HIGH (ref 70–99)
Glucose-Capillary: 147 mg/dL — ABNORMAL HIGH (ref 70–99)

## 2010-05-29 MED ORDER — XENON XE 133 GAS
10.0000 | GAS_FOR_INHALATION | Freq: Once | RESPIRATORY_TRACT | Status: AC | PRN
  Administered 2010-05-29: 10 via RESPIRATORY_TRACT

## 2010-05-29 MED ORDER — TECHNETIUM TO 99M ALBUMIN AGGREGATED
6.0000 | Freq: Once | INTRAVENOUS | Status: AC | PRN
  Administered 2010-05-29: 6 via INTRAVENOUS

## 2010-05-30 ENCOUNTER — Inpatient Hospital Stay (HOSPITAL_COMMUNITY): Payer: Medicare Other

## 2010-05-30 LAB — CBC
MCH: 28.3 pg (ref 26.0–34.0)
MCHC: 32.3 g/dL (ref 30.0–36.0)
Platelets: 283 10*3/uL (ref 150–400)
RBC: 3.07 MIL/uL — ABNORMAL LOW (ref 3.87–5.11)

## 2010-05-30 LAB — URIC ACID: Uric Acid, Serum: 11.7 mg/dL — ABNORMAL HIGH (ref 2.4–7.0)

## 2010-05-30 LAB — GLUCOSE, CAPILLARY: Glucose-Capillary: 112 mg/dL — ABNORMAL HIGH (ref 70–99)

## 2010-05-30 LAB — SEDIMENTATION RATE: Sed Rate: 54 mm/hr — ABNORMAL HIGH (ref 0–22)

## 2010-05-31 ENCOUNTER — Inpatient Hospital Stay (HOSPITAL_COMMUNITY): Payer: Medicare Other

## 2010-05-31 LAB — CBC
HCT: 28.3 % — ABNORMAL LOW (ref 36.0–46.0)
MCH: 28 pg (ref 26.0–34.0)
MCHC: 32.2 g/dL (ref 30.0–36.0)
MCV: 87.1 fL (ref 78.0–100.0)
RDW: 13.9 % (ref 11.5–15.5)
WBC: 4.6 10*3/uL (ref 4.0–10.5)

## 2010-05-31 LAB — GLUCOSE, CAPILLARY: Glucose-Capillary: 183 mg/dL — ABNORMAL HIGH (ref 70–99)

## 2010-06-01 LAB — CBC
Hemoglobin: 9.1 g/dL — ABNORMAL LOW (ref 12.0–15.0)
MCH: 28 pg (ref 26.0–34.0)
RBC: 3.25 MIL/uL — ABNORMAL LOW (ref 3.87–5.11)

## 2010-06-01 LAB — GLUCOSE, CAPILLARY
Glucose-Capillary: 158 mg/dL — ABNORMAL HIGH (ref 70–99)
Glucose-Capillary: 245 mg/dL — ABNORMAL HIGH (ref 70–99)

## 2010-06-01 LAB — BASIC METABOLIC PANEL
CO2: 26 mEq/L (ref 19–32)
Chloride: 109 mEq/L (ref 96–112)
Creatinine, Ser: 0.95 mg/dL (ref 0.4–1.2)
GFR calc Af Amer: >60 mL/min (ref >60)

## 2010-06-02 LAB — COMPREHENSIVE METABOLIC PANEL
AST: 31 U/L (ref 0–37)
Albumin: 2.9 g/dL — ABNORMAL LOW (ref 3.5–5.2)
Alkaline Phosphatase: 40 U/L (ref 39–117)
Chloride: 107 mEq/L (ref 96–112)
GFR calc Af Amer: >60 mL/min (ref >60)
Potassium: 3.6 mEq/L (ref 3.5–5.1)
Sodium: 140 mEq/L (ref 135–145)
Total Bilirubin: <0.1 mg/dL — ABNORMAL LOW (ref 0.3–1.2)

## 2010-06-02 LAB — CBC
Platelets: 262 10*3/uL (ref 150–400)
RBC: 3.04 MIL/uL — ABNORMAL LOW (ref 3.87–5.11)
WBC: 5.8 10*3/uL (ref 4.0–10.5)

## 2010-06-02 LAB — GLUCOSE, CAPILLARY: Glucose-Capillary: 145 mg/dL — ABNORMAL HIGH (ref 70–99)

## 2010-06-03 LAB — HEMOCCULT GUIAC POC 1CARD (OFFICE): Fecal Occult Bld: POSITIVE

## 2010-06-03 LAB — GLUCOSE, CAPILLARY
Glucose-Capillary: 98 mg/dL (ref 70–99)
Glucose-Capillary: 162 mg/dL — ABNORMAL HIGH (ref 70–99)
Glucose-Capillary: 145 mg/dL — ABNORMAL HIGH (ref 70–99)

## 2010-06-04 LAB — GLUCOSE, CAPILLARY
Glucose-Capillary: 109 mg/dL — ABNORMAL HIGH (ref 70–99)
Glucose-Capillary: 129 mg/dL — ABNORMAL HIGH (ref 70–99)
Glucose-Capillary: 108 mg/dL — ABNORMAL HIGH (ref 70–99)
Glucose-Capillary: 196 mg/dL — ABNORMAL HIGH (ref 70–99)

## 2010-06-04 LAB — BASIC METABOLIC PANEL
CO2: 27 mEq/L (ref 19–32)
Chloride: 106 mEq/L (ref 96–112)
Creatinine, Ser: 1.03 mg/dL (ref 0.4–1.2)
GFR calc Af Amer: >60 mL/min (ref >60)
Potassium: 3.7 mEq/L (ref 3.5–5.1)

## 2010-06-04 LAB — CBC
Hemoglobin: 9.3 g/dL — ABNORMAL LOW (ref 12.0–15.0)
MCH: 27.5 pg (ref 26.0–34.0)
RBC: 3.38 MIL/uL — ABNORMAL LOW (ref 3.87–5.11)
WBC: 7.4 10*3/uL (ref 4.0–10.5)

## 2010-06-05 LAB — GLUCOSE, CAPILLARY: Glucose-Capillary: 103 mg/dL — ABNORMAL HIGH (ref 70–99)

## 2010-06-17 NOTE — H&P (Signed)
NAMEASMI, FUGERE                 ACCOUNT NO.:  [id number]  MEDICAL RECORD NO.:  [id number]           PATIENT TYPE:  I  LOCATION:  3733                         FACILITY:  [facility]  PHYSICIAN:  Osvaldo Shipper. Betti Goodenow, M.D.DATE OF BIRTH:  09-03-1930  DATE OF ADMISSION:  05/28/2010 DATE OF DISCHARGE:                             HISTORY & PHYSICAL   CHIEF COMPLAINT:  Chest pain.  HISTORY OF PRESENT ILLNESS:  This 77 year old white woman with a history of hypertension, gastroesophageal reflux disease, dyslipidemia, stroke, insulin-dependent diabetes mellitus, insomnia, previous deep venous thrombosis, as well as carotid artery disease presented to the hospital emergency room with complaints of chest pain.  The patient reports that the chest pain began approximately 3 o'clock this afternoon.  It was located in the substernal area.  The discomfort radiated somewhat to her left jaw.  The patient also complained of some nausea, no vomiting, a sensation of feeling hot.  The pain lasted quite some time and was located substernally, but did not radiate to her arms.  The discomfort lasted until [facility] picked the patient up and the patient reports at the time of exam now that the pain has begun to decrease somewhat.  The patient has had a previous stroke and is a very poor historian, but at the present time denies any severe chest discomfort.  PAST MEDICAL HISTORY:  Significant for; 1. Hypertension. 2. GERD. 3. Dyslipidemia. 4. Stroke. 5. Diabetes. 6. Insomnia. 7. Deep venous thrombosis. 8. Nonocclusive coronary artery disease.  FAMILY HISTORY:  Unremarkable.  SURGICAL HISTORY:  The patient has an adenoidectomy, cholecystectomy, hysterectomy, and multiple abdominal surgeries for obstruction.  SOCIAL HISTORY:  The patient does not smoke, does not consume alcohol and no drug abuse.  ALLERGIES: 1. TYLENOL. 2. ASPIRIN. 3. CODEINE. 4. PENICILLIN. 5. IVP DYE. 6.  IBUPROFEN.  MEDICATIONS: 1. Actos 50 mg daily,. 2. Exforge 5/320 mg p.o. daily. 3. Lasix 40 mg p.o. daily. 4. Plavix 75 mg p.o. daily. 5. Ranitidine 150 mg p.o. b.i.d. 6. Ultram 50 mg p.o. every 4-6 hours for pain. 7. Ambien 5 mg p.o. at bedtime p.r.n. for insomnia. 8. Tekturna HCT 300/12.5 mg p.o. daily. 9. Levemir FlexPen; the current dose of insulin is not clear.  REVIEW OF SYSTEMS:  CONSTITUTIONAL:  The patient denies any current fever or chills, but she has complained of feeling hot.  CARDIOVASCULAR: Significant for chest pain.  RESPIRATORY:  Has complaints of shortness of breath.  Denies any coughing or wheezing.  GASTROINTESTINAL: Positive for nausea.  No vomiting, no diarrhea, no abdominal pain. GENITOURINARY:  No dysuria, but does complain of frequency. MUSCULOSKELETAL:  Swelling in the right lower extremity.  No complaints of back pain.  HEMATOLOGIC:  No evidence of any anemia.  PSYCHOLOGICAL: History of mild anxiety with depression per patient.  PHYSICAL EXAMINATION:  VITAL SIGNS:  Temperature 98.7, heart rate 74, blood pressure 123/56, respirations 18. GENERAL:  Well-developed, obese white woman examined in mild-to-moderate distress. CRANIUM AND SCALP:  Normocephalic.  No evidence of any trauma. EYES, EARS, NOSE, AND THROAT:  Unremarkable. NECK:  The patient does have a left neck scar.  THORAX:  No chest wall tenderness elicited posteriorly.  She does have some anterior chest wall tenderness that is minimal. LUNGS:  Clear to auscultation and percussion. CARDIOVASCULAR:  S1, S2 normal.  No S3.  No rubs, lifts, or thrills. ABDOMEN:  Soft with surgical scar present in the midline.  No tenderness elicited.  No hepatomegaly. EXTERNAL GENITALIA:  Appeared normal. EXTREMITIES:  No cyanosis or clubbing.  The patient does have edema of the right lower extremity at about 1+.  The left lower extremity has trace edema.  Pulses are unremarkable.  There are no  splinter hemorrhages. NEUROLOGIC:  Cranial nerves II-XII grossly intact.  The patient does have dysarthria when she speaks.  LABORATORY STUDIES:  The patient had chest x-ray which revealed evidence of a possible pneumonia.  This was seen by the radiologist and reports that an opacity was seen in the left lung base.  The patient's sodium was 138, potassium 3.9, chloride 110, BUN 39, creatinine 1.4, glucose 126, hemoglobin 10.5, hematocrit 31.0, myoglobin point-of-care marker 114, CK-MB 1.8, troponin less than 0.05.  IMPRESSION: 1. Left lower lobe pneumonia. 2. Chest pain, rule out myocardial infarction. 3. Previous history of deep venous thrombosis with chest pain.  We     will exclude possible pulmonary emboli. 4. Uncontrolled type 2 diabetes mellitus. 5. History of hypertension. 6. History of carotid artery disease status post left carotid     endarterectomy.  PLAN:  We will admit the patient to the hospital.  Begin IV antibiotic therapy.  We will also exclude the possibility of acute pulmonary emboli, and we will fully anticoagulate the patient at this time for that possibility.     Osvaldo Shipper. Neftaly Inzunza, M.D.     JOS/MEDQ  D:  05/28/2010  T:  05/29/2010  Job:  [id number]  Electronically Signed by Donia Guiles M.D. on 06/17/2010 08:44:59 PM

## 2010-06-23 NOTE — Consult Note (Signed)
Katherine, [name]                 ACCOUNT NO.:  [id number]  MEDICAL RECORD NO.:  [id number]           PATIENT TYPE:  I  LOCATION:  3733                         FACILITY:  [facility]  PHYSICIAN:  Katherine Hawks. Katherine Howard, MD    DATE OF BIRTH:  June 20, 1930  DATE OF CONSULTATION:  06/04/2010 DATE OF DISCHARGE:                                CONSULTATION   PRIMARY CARE PROVIDER:  Osvaldo Shipper. Spruill, MD  REASON FOR CONSULTATION:  Heme-positive stool and anemia.  HISTORY OF PRESENT ILLNESS:  This is a 77 year old black female with a past medical history of hypertension, gastroesophageal reflux disease, hyperlipidemia, stroke, insulin dependent diabetes, carotid artery disease, and history of DVT status post [place] filter placement, and history of iron deficiency anemia with findings of small bowel AVMs who was admitted to the hospital with complaints of chest pain.  During the course of her hospitalization, she was noted to have a mild decrease in her hemoglobin, she dropped down to about 8.7 and  Hemoccult of her stool revealed that was positive.  Subsequently, GI consultation was requested.  In 2009, Dr. Elnoria [name] performed EGD colonoscopy and capsule endoscopy to workup for iron deficiency anemia.  At that time, her hemoglobin was approximately 10.  She was also on Coumadin.  The patient was noted to have some small bowel AVMs, but none were actively bleeding at that time and some of them were felt to be little bit more distal than can be reached with enteroscope.  The recommendation was for her to undergo monitoring of her CBC as long she has been on the Coumadin issue.  During this hospitalization, she has not been on any type of Coumadin.  She has not been on any medication for some time.  However, she was started on Lovenox and as a result of findings of the anemia and heme-positive stools, this medication was stopped.  There is no evidence of any melena or hematochezia.  PAST MEDICAL HISTORY  AND PAST SURGICAL HISTORY:  As stated above.  FAMILY HISTORY:  Noncontributory.  SOCIAL HISTORY:  Negative for alcohol, tobacco, or illicit drug use.  ALLERGIES:  TYLENOL, ASPIRIN, CODEINE, PENICILLIN, IV DYE, and IBUPROFEN.  MEDICATIONS:  __________ Lumigan, Alphagan, Plavix, colchicine, famotidine, Uloric, Lasix, Benicar, Actos, prednisone, alprazolam, sliding scale insulin, loperamide, milk of magnesium, Zofran, tramadol, and Ambien.  PHYSICAL EXAMINATION:  VITAL SIGNS:  Blood pressure is 100/55, heart rate 74, respirations 18, temperature is 98.2. GENERAL:  The patient is in no acute distress, alert and oriented. HEENT:  Normocephalic, atraumatic.  Extraocular muscles intact. NECK:  Supple.  No lymphadenopathy. LUNGS:  Clear to auscultation bilaterally. CARDIOVASCULAR:  Regular rate and rhythm. ABDOMEN:  Obese, soft, nontender, nondistended. EXTREMITIES:  No clubbing, cyanosis, or edema.  LABORATORY VALUES:  White blood cell count is 7.4, hemoglobin is 9.3, MCV is 85.8, platelets at 280.  Sodium 140, potassium 3.7, chloride 106,CO2 of 27, glucose 122, BUN 21, creatinine 1.0.  IMPRESSION: 1. Heme-positive stool. 2. History of small bowel arteriovenous malformations. 3. Other multiple medical problems.  The patient is currently hemodynamically stable and during the 2009, evaluation did  not identify any actively bleeding AVMs.  I think the best way to follow the patient is to follow her serial hemoglobins and if there is a dramatic change then repeat endoscopy and/or enteroscopy can be performed.  Currently, her hemoglobin is similar to her prior baseline around in 2009.  PLAN:  I will see the patient back in the office in approximately 2 weeks and follow her with serial CBCs.     Katherine Hawks Katherine Howard, MD     PDH/MEDQ  D:  06/04/2010  T:  06/05/2010  Job:  [id number]  Electronically Signed by Jeani Hawking MD on 06/23/2010 08:53:46 PM

## 2010-07-03 NOTE — Discharge Summary (Signed)
Katherine [name], Katherine [name]                 ACCOUNT NO.:  [id number]  MEDICAL RECORD NO.:  [id number]           PATIENT TYPE:  I  LOCATION:  3733                         FACILITY:  [facility]  PHYSICIAN:  Osvaldo Shipper. Kinleigh Nault, M.D.DATE OF BIRTH:  [birth year]  DATE OF ADMISSION:  05/28/2010 DATE OF DISCHARGE:  06/05/2010                              DISCHARGE SUMMARY   DISCHARGE DIAGNOSES: 1. Pneumonia. 2. Diabetes mellitus. 3. Acute gout. 4. Hypertension. 5. Arteriovenous malformation. 6. Anemia. 7. Old stroke.  Katherine [name] is a 77 year old patient who has a history of hypertension, gastroesophageal reflux, previous stroke, insulin-dependent diabetes mellitus, insomnia and a previous deep vein thrombosis as well as carotid artery disease who presented to the [facility] with complaint of chest pain.  The patient stated that the pain started on the day of admission.  It was located primarily in the substernal area. The patient complained of some nausea but no actual vomiting.  On examination, the vital signs were mostly stable.  There was no chest wall tenderness posteriorly, but there was some anteriorly.  The lungs were clear to auscultation.  The left lower extremity had some trace edema but was otherwise essentially within normal limits.  Chest x-ray revealed evidence of a possible pneumonia at the left lung base.  The electrolytes were within normal limits.  Sodium was elevated at 39.  The cardiac point of care marker was negative for acute problem.  The patient was subsequently admitted for left lower lobe pneumonia.  She will be evaluated to rule out myocardial infarction or cardiac event and will be evaluated for pulmonary emboli since she had a previous history of deep vein thrombus.  The patient was admitted to the medical service, was placed on IV antibiotic therapy, IV fluids and good pulmonary toilet.  On March 28, the chest pain had significantly improved.  The patient  had a lower extremity vascular examination and no evidence of DVT was noted.  Serial cardiac markers were negative for acute cardiac event.  The patient was seen by pharmacy for Lovenox prophylaxis, also seen by occupational therapy team.  The patient continued to have some pain in the chest and also in the knee.  It was found that the patient had an acute episode of gout.  This was addressed.  On April 2, the patient unfortunately had problem with right great toe pain and a gout treatment was continued.  Evaluation revealed pneumonia resolving.  There was noted some anemia still present and some evidence of GI bleeding.  GI evaluation was considered.  The GI team reviewed the patient's previous records and examined the patient.  The patient has a history of small bowel AVMs.  It was their opinion that at this time the patient and the previous bleeding was stable and that the workup could be completed as an outpatient.  On June 05, 2010, it was the opinion of the attending physician and the consulting physicians that the patient had received maximum benefit from this hospitalization and could be discharged home with remainder of her workup and treatment as an outpatient.  MEDICATIONS AT DISCHARGE: 1.  Colchicine 0.6 mg twice daily. 2. Uloric 40 mg daily. 3. Imodium 2 mg as needed for diarrhea. 4. Actos 15 mg daily with a meal. 5. Prednisone 10 mg twice daily with meals. 6. Ambien 10 mg at bedtime as needed for rest. 7. Betaxolol ophthalmic suspension 0.25% 1 drop both eyes twice daily. 8. Alphagan 0.1% 1 drop both eyes 3 times a day. 9. Exforge 5/160 one daily. 10.Furosemide 40 mg daily. 11.Levemir insulin 20 units subcu twice daily as needed.  The patient is to be seen in the office in 2 weeks sooner if any changes, problems or concerns.  The patient will also be followed by GI for additional evaluation of the small intestinal AVMs and possible GI- related  issue.     Katherine [name], P.A.   ______________________________ Osvaldo Shipper. Leeandra Ellerson, M.D.    HB/MEDQ  D:  06/27/2010  T:  06/28/2010  Job:  [id number]  Electronically Signed by Katherine [name] P.A. on 07/03/2010 09:42:36 AM Electronically Signed by Donia Guiles M.D. on 07/03/2010 12:12:01 PM

## 2010-07-16 NOTE — Discharge Summary (Signed)
Katherine [name], Katherine [name]               ACCOUNT NO.:  [id number]   MEDICAL RECORD NO.:  [id number]          PATIENT TYPE:  INP   LOCATION:  2031                         FACILITY:  [facility]   PHYSICIAN:  Osvaldo Shipper. Spruill, M.D.DATE OF BIRTH:  Mar 02, 1931   DATE OF ADMISSION:  09/13/2007  DATE OF DISCHARGE:  09/24/2007                               DISCHARGE SUMMARY   DISCHARGE DIAGNOSES:  1. Pneumonia.  2. Deep vein thrombus of the lower extremity.  3. Pulmonary embolism.  4. Esophageal reflux.  5. Hyperlipidemia.  6. Non-insulin dependent diabetes mellitus.   The admission history and physical as well as the daily visits were  completed by Dr. Cherlynn Kaiser.  Katherine [name] is a 77 year old patient  who has a history of hypertension, diabetes mellitus, coronary artery  disease, has had a cerebrovascular accident, and carotid artery disease  in the past who presented to the [facility] with complaint of  chest and back pain.  She complains of productive cough, but no  hemoptysis and no syncope.  She has not had vomiting, and she has not  had nausea.  She complains of dyspnea even at rest.  After evaluation in  the emergency department, she was found to have infiltrates on the chest  x-ray and is admitted for her pneumonia at this time.   HOSPITAL COURSE:  The patient was started on IV antibiotics in the  emergency department.  She was admitted to the medical service and was  placed on Coumadin protocol due to history of a cerebrovascular accident  and previous DVTs and coronary artery disease.  The patient was placed  on IV Avelox.  On September 15, 2007, the patient underwent lower extremity  Doppler study.  The right study revealed no obvious evidence of a DVT.  There was superficial thrombosis or a Baker cyst present.  The left,  however, was positive for an acute DVT in the popliteal vein as well as  sluggish flow in the common femoral and greater saphenous vein.  The  patient was  then started on heparin according to the pharmacy protocol.   There was some complications in getting the right leg pain under some  control, but this was gradually accomplished.  Serial evaluations were  also done concerning the pneumonia.   On September 21, 2007, a right lower extremity venous Doppler was again  performed and no obvious evidence of DVT was noted.  A right arterial  duplex was also done, and there was triphasic wave form throughout with  no areas of significant stenosis being present.   The IV antibiotics were gradually discontinued.  After the second set of  studies, the IV heparin was able to be discontinued and on September 24, 2007, it was the opinion that the patient had received maximum benefit  from this hospitalization.  Her INR on her PT was in good therapeutic  range and it was the opinion that she could be discharged home.  The  patient is to be seen in the office in 1 week, sooner if any changes,  problems, or concerns.  MEDICATIONS AT DISCHARGE:  1. Ambien 12.5 as needed.  2. Nexium 40 mg daily.  3. Lipitor 20 mg daily.  4. Coumadin 4 mg daily.  5. Diazepam 5 mg t.i.d.  6. Avandia 4 mg daily.  7. Plavix 75 mg daily.  8. Humulin 70/30 daily.  9. Xalatan And Lumigan as previously ordered.  10.Furosemide 40 mg daily.  11.Tramadol and acetaminophen every 6 hours as needed.      Katherine [name], P.A.      Osvaldo Shipper. Spruill, M.D.  Electronically Signed    HB/MEDQ  D:  10/26/2007  T:  10/27/2007  Job:  [id number]

## 2010-07-16 NOTE — Assessment & Plan Note (Signed)
[facility]                         GASTROENTEROLOGY OFFICE NOTE   NAME:[name], [name]                      MRN:          [id number]  DATE:11/30/2006                            DOB:          1930/04/27    PRIMARY CARE PHYSICIAN:  Osvaldo Shipper. Spruill, M.D.   GASTROINTESTINAL PROBLEM LIST:  1. Heme positive stool, mild anemia with a hemoglobin of 11 on      Coumadin. Status post colonoscopy by Dr. Barnet Pall January 2007,      was normal.  Status post EGD January 2008 by me, was normal.   INTERVAL HISTORY:  I last saw Katherine [name] 8 months ago.  Since then she has  continued to have intermittent abdominal discomfort.  She also tells me  she had some drainage from a lower midline abdominal incision.  The pain  she feels around this incision at times as well as in her right lower  quadrant.  She says they are definitely worse with certain positions  especially standing up and when she does her dishes.  No nausea,  vomiting, no obstructive-sounding incidents.  She moves her bowels 2-3  times a day.   CURRENT MEDICATIONS:  Humulin, Tekturna, Xalatan drops, Nexium,  diazepam, Coumadin, Plavix, Avandia, Lasix, __________.   PHYSICAL EXAMINATION:  VITAL SIGNS:  Weight 219 pounds which is 7 pounds  lower than what she was 8 months ago.  Blood pressure 138/58, pulse 76.  CONSTITUTIONAL:  Obese, otherwise well-appearing.  ABDOMEN:  Soft, nontender, nondistended.  Normal bowel sounds.  Multiple  old abdominal incisions, open cholecystectomy incision from her  umbilicus to her pelvis.  She has a large amount of scar tissue with  obvious old surgeries and dehiscence.   ASSESSMENT:  A 77 year old woman with intermittent abdominal pain.   PLAN:  She has had multiple abdominal surgeries including  cholecystectomy, hysterectomy, lysis of adhesions.  She has had a  dehiscence in the past and has a large amount of scar tissue externally  on her abdomen.  I suspect she  has also a large amount of scar tissue  internally in her abdomen.  I think that is probably what is causing  intermittent abdominal discomfort.  She had recent lab tests showing  only mild anemia but her liver test and comprehensive metabolic profile  were normal.  She has had recent upper and lower endoscopies.  I do not  think either of those need to be repeated at this point.  I think simply  observing her clinically.  She is obviously  at risk for future bowel obstructions but there would be nothing that we  could do to prevent that besides perhaps keeping her bowels moving  fairly well.     Rachael Fee, MD  Electronically Signed    DPJ/MedQ  DD: 11/30/2006  DT: 11/30/2006  Job #: [id number]   cc:   Osvaldo Shipper. Spruill, M.D.

## 2010-07-19 ENCOUNTER — Other Ambulatory Visit: Payer: Self-pay | Admitting: Cardiology

## 2010-07-19 DIAGNOSIS — Z1231 Encounter for screening mammogram for malignant neoplasm of breast: Secondary | ICD-10-CM

## 2010-07-19 NOTE — H&P (Signed)
NAME:  Katherine [name], Katherine [name]                           ACCOUNT NO.:  [id number]   MEDICAL RECORD NO.:  [id number]                   PATIENT TYPE:  INP   LOCATION:  NA                                   FACILITY:  [facility]   PHYSICIAN:  Coral Ceo, P.A.                  DATE OF BIRTH:  [birth year]   DATE OF ADMISSION:  DATE OF DISCHARGE:                                HISTORY & PHYSICAL   DICTATION RESUMED AT THIS POINT   REVIEW OF SYSTEMS:  In addition to what has previously been dictated in the  History of Present Illness, she has diabetes which has been difficult to  control.  She states that her sugars run anywhere from 200 to 400 generally.  She also states that she has a history of an irregular heart beat although I  can find no documentation to confirm this.  She has some chest pain  periodically and this was evaluated during her hospitalization, but no  evidence of acute myocardial infarction was found.  She is also easily  fatigued and becomes short of breath with a lot of exertion.  She has some  lower extremity edema when she is on her feet quite a bit.  She has chronic  abdominal pain which has also been previously noted.  For the past three to  four days, she has had some nasal congestion and mild sore throat.  She  denies weight loss, fever, chills, any recent acute TIA symptoms, heart  palpitations, cough, wheezing, nausea, vomiting, diarrhea, constipation,  heart burn, melena, hematochezia, hematuria, dysuria, nocturia, lower  extremity claudication symptoms or rest pain, anxiety or depression,  intolerance to heat or cold.   FAMILY HISTORY:  Her mother died at the age of 75 and had a host of medical  problems which include diabetes mellitus, renal failure, coronary artery  disease and hypertension.  Her father died at age 73 of aneurysm of the  brain.  It is also unclear whether he had a pulmonary embolus at this time  as well.  He also had diabetes mellitus and coronary  artery disease.  She  has one sister who died at the age of 48 of complications of diabetes.   SOCIAL HISTORY:  She is widowed and has two children.  She currently resides  at the [facility] where she is undergoing post CVA  rehabilitation.  She previously smoked about a half pack of cigarettes per  day and smoked from 1954 to 2003.  She denies alcohol use.   PHYSICAL EXAMINATION:  VITAL SIGNS:  Blood pressure is 134/84, pulse 72 and  regular, respirations 18 and unlabored.  GENERAL:  This is an elderly, black female in no acute distress.  HEENT:  Normocephalic and atraumatic.  Pupils are equally round and reactive  to light and accommodation.  Extraocular movements intact.  She has  cataracts  bilaterally as well as changes related to glaucoma.  The left TM  and canal are clear.  The right canal is occluded with cerumen and the TM is  not visualized.  Nares patent bilaterally.  Oropharynx is clear with moist  mucous membranes.  NECK:  Supple, without lymphadenopathy or thyromegaly.  There is a soft left  carotid bruit.  LUNGS:  Clear to auscultation.  HEART:  Regular rate and rhythm, without murmurs, rubs or gallops.  ABDOMEN:  Soft, obese, nontender, not distended with active bowel sounds in  all quadrants.  There are multiple surgical scars which are well healed.  EXTREMITIES:  No clubbing, cyanosis or edema.  Her feet are slightly cool to  touch with somewhat diminished pulses bilaterally.  NEUROLOGICAL:  She still has a significant aphasia from her CVA.  Cranial  nerves II through XII otherwise grossly intact.  Her gait is somewhat  unsteady and she does use a rolling walker.  She has generalized weakness  but particularly right upper and right lower extremity strength is  diminished compared to the left.  Her grips are somewhat diminished on the  right as well.  She is alert and oriented times three.   ASSESSMENT/PLAN:  This is a 77 year old black female,  status post a recent  acute CVA with severe left internal carotid artery stenosis.  She will be  admitted to [facility] on 02/07/[facility] and undergo left carotid  endarterectomy by Dr. Gretta Began.                                               Coral Ceo, P.A.    GC/MEDQ  D:  02/03/2002  T:  02/03/2002  Job:  [id number]   cc:   Osvaldo Shipper. Spruill, M.D.  [address] [id number]  [place]  [place] [address]  Fax: [phone]

## 2010-07-19 NOTE — Discharge Summary (Signed)
NAMEANICKA, STUCKERT                 ACCOUNT NO.:  [id number]   MEDICAL RECORD NO.:  [id number]          PATIENT TYPE:  INP   LOCATION:  4742                         FACILITY:  [facility]   PHYSICIAN:  Osvaldo Shipper. Spruill, M.D.DATE OF BIRTH:  August 17, 1930   DATE OF ADMISSION:  03/10/2005  DATE OF DISCHARGE:  03/30/2005                                 DISCHARGE SUMMARY   Discharge   Please cancel dictation.      Ivery Quale, P.A.      Osvaldo Shipper. Spruill, M.D.  Electronically Signed    HB/MEDQ  D:  05/13/2005  T:  05/14/2005  Job:  [id number]

## 2010-07-19 NOTE — Discharge Summary (Signed)
[place]  Patient:    Katherine [name], Katherine [name]                        MRN: [id number] Adm. Date:  [id number] Disc. Date: 06/13/00 Attending:  Robynn Pane                           Discharge Summary  ADMISSION DIAGNOSES: 1. Atypical chest pain, rule out myocardial infarction. 2. Hypertension. 3. Non-insulin-dependent diabetes mellitus. 4. History of cerebrovascular accident. 5. Tobacco abuse. 6. Positive family history of coronary artery disease. 7. Cystitis.  FINAL DIAGNOSES: 1. Atypical chest pain, myocardial infarction ruled out.  Negative Persantine    Cardiolite with ejection fraction of 63%. 2. Hypertension. 3. Non-insulin-dependent diabetes mellitus. 4. History of cerebrovascular accident. 5. Tobacco abuse. 6. Cystitis. 7. Family history of coronary artery disease.  DISCHARGE MEDICATIONS: 1. Toprol XL 25 mg 1 tablet daily. 2. Coumadin 5 mg 1 tablet daily. 3. Protonix 40 mg 1 tablet daily 1/2 hour before breakfast. 4. Glucotrol XL 5 mg 1 tablet daily in the morning. 5. Xanax 0.25 mg 1 tablet twice daily. 6. Macrobid 100 mg 1 tablet twice daily for three more days. 7. The patient has been advised to continue her eyedrops as before.  ACTIVITY:  As tolerated.  DIET:  Low-salt/low-cholesterol 1800 calorie ADA diet.  The patient has been advised to monitor her blood sugar daily.  DISCHARGE INSTRUCTIONS:  CBC and pro time in one week.  FOLLOW-UP:  Follow up with Dr. ______ in one week.  CONDITION ON DISCHARGE:  Stable.  HISTORY OF PRESENT ILLNESS:  Ms. Katherine [name] is a 77 year old black female with past medical history significant for multiple medical problems and multiple surgeries, hypertension, non-insulin-dependent diabetes mellitus, mild coronary artery disease, questionable history of congestive heart failure, history of CVA, tobacco abuse, complaints of retrosternal and precordial chest pain off and on for the last four days  associated with nausea and diaphoresis.  The pain radiated to the left shoulder and relieved in a few minutes on its own.  Also complains of exertional dyspnea but activity is very limited.  She states she started to feel dizzy, weak, and had near-syncopal episodes.  Denies any palpitation, lightheadedness, or actual syncopal episodes.  Denies any PND, orthopnea, leg swelling.  Also complains of lower abdominal pain associated with dysuria.  She saw Dr. Brunilda Payor a few days ago and was treated with Macrobid with improvement in her symptoms.  Denies any fever or chills or any urinary complaints at this time.  She denies any cough or sore throat.  PAST MEDICAL HISTORY:  As above.  PAST SURGICAL HISTORY:  She had a hysterectomy 30+ years ago.  She had ovarian cyst resection many years ago.  Status post hemorrhoidectomy many years ago. She had multiple urethral dilatations for urethral stricture.  Status post incisional hernia repair.  Status post cholecystectomy 4-5 years ago.  SOCIAL HISTORY:  She is divorced and lives alone.  Smoked 1/2 pack per day for 30+ years.  No history of alcohol abuse.  She worked as a Agricultural engineer, on disability.  FAMILY HISTORY:  Father died of pulmonary embolism at the age of 75.  He was diabetic.  He had DVT prior to PE.  Mother died of questionable heart problems, she had kidney failure, she was diabetic, at the age of 16.  One brother died of MI in his 30s.  One sister died of end-stage renal disease at the age of 65.  HOME MEDICATIONS:  1. Macrobid 100 mg p.o. b.i.d.  2. Tranxene 50 mg p.o. t.i.d.  3. Glucotrol XL 5 mg p.o. q.d.  4. All heart medications.  5. Coumadin 5 mg p.o. q.d.  6. Premarin 0.625 mg p.o. q.d.  7. Lasix 20 mg p.o. q.d.  8. Colchicine 0.6 mg p.o. q.d.  9. Xanax 0.5 mg p.o. t.i.d. 10. Talwin NX 5/50 p.o. q.4-6h. p.r.n.  PHYSICAL EXAMINATION:  GENERAL:  She was alert, awake, oriented in no acute distress.  Drowsy.  VITAL  SIGNS:  Blood pressure 114/64, pulse 56, sinus bradycardia on monitor.  HEENT:  Conjunctiva was pink.  NECK:  Supple.  No JVD, no bruits.  LUNGS:  Clear to auscultation without rhonchi or rales.  CARDIOVASCULAR:  S1, S2 normal.  There was no S3 gallop.  There was a soft systolic murmur at the apex.  ABDOMEN:  Soft.  Bowel sounds were present.  There was mild suprapubic tenderness.  There was no guarding.  EXTREMITIES:  There was no clubbing, cyanosis, or edema.  LABORATORY DATA:  EKG showed sinus bradycardia with nonspecific T wave changes.  Her cholesterol was 204, triglycerides 205, HDL 44, LDL 119.  Two sets of CPK-MB:  CPK was 85 with MB of 1.2 and second set CPK at 71 and MB 0.8.  Two sets of troponin-I were 0.01.  Her sodium was 139, potassium 3.1, chloride 99. Blood sugar was 206.  BUN 9, creatinine 0.6.  Today, her potassium is 3.6, BUN 13, creatinine 0.6.  Pro time is 18.4, INR of 1.9.  Hemoglobin 10.7, hematocrit 31.5, white count 4.1.  HOSPITAL COURSE:  The patient was admitted to the telemetry unit.  MI was ruled out by serial enzymes and EKG.  The patient underwent Persantine Cardiolite on April 12 that showed no acute ischemic changes with an ejection fraction of 63%.  The patient did not have any anginal chest pain during the hospital stay.  Hemoglobin yesterday was 11.8.  This morning, hemoglobin is 10.7 due to hydration.  There is no evidence of active bleeding.  The patient will be discharged home on the above medications and will be followed up closely in one week.  We will recheck her hemoglobin in one week.  The patient has been advised if she notices any blood in the stool or black tarry stool or feels weak she should report to Dr. ______. DD:  06/13/00 TD:  06/13/00 Job: [id number]

## 2010-07-19 NOTE — Discharge Summary (Signed)
Katherine [name], Katherine [name]                 ACCOUNT NO.:  [id number]   MEDICAL RECORD NO.:  [id number]          PATIENT TYPE:  INP   LOCATION:  4742                         FACILITY:  [facility]   PHYSICIAN:  Osvaldo Shipper. Spruill, M.D.DATE OF BIRTH:  05/22/1930   DATE OF ADMISSION:  03/10/2005  DATE OF DISCHARGE:                                 DISCHARGE SUMMARY   AGE:  77.   ADMITTING PHYSICIAN:  Osvaldo Shipper. Spruill, M.D.   ADMITTING AND PROVISIONAL DIAGNOSES:  1.  Possible gastroenteritis.  2.  Possible diabetic gastroparesis.  3.  Rule out small bowel obstruction.  4.  Diabetes.  5.  Hypertension.  6.  Carotid stenosis.  7.  Anxiety.   DISCHARGE DIAGNOSES:  1.  Gastroenteritis.  2.  Anemia secondary to gastrointestinal blood loss.  3.  Coronary artery disease.  4.  Hypertension.  5.  Type 2 diabetes mellitus.  6.  Anemia.  7.  Anxiety with depression.  8.  Deconditioning.  9.  Obesity.   BRIEF HISTORY AND REASON FOR ADMISSION:  This is one of multiple admissions  for this 77 year old black woman with a history of hypertension and  diabetes, coronary disease, previous stroke, carotid artery stenosis who was  admitted to the hospital complaining of nausea and vomiting.  The patient  also complained of frequent bouts of diarrhea with 4-5 bowel movements  daily.  She denied any obvious or frank blood in her stools nor denied any  presence of any black stools.  She states that the diarrhea was watery at  the time and had very frequent movements.  The patient has no previous  history of antibiotic usage prior to the onset of this diarrhea.  She also  states the pain medications and other medications prescribed at home were  ineffective at controlling her problem.  She subsequently came in to the  hospital for further evaluation.   LABORATORY STUDIES:  After the patient was admitted to the hospital, her  troponin and myoglobin were obtained.  Myoglobin was 114, CK was 1.4,  troponin less  than 0.5.  The patient had a stool for ova and parasites.  No  ova and parasites seen.  She also had a stool for Giardia and no evidence of  Giardia was seen.  She had a stool for C. difficile toxin which was also  negative and further revealed no evidence of Salmonella, Shigella,  Campylobacter or Yersinia noted.   Stool for white blood cells was obtained and none were seen.  A urine  culture was also obtained, revealed no growth.  The patient had a urinalysis  on March 13, 2005, which revealed specific gravity of 1.005, pH 5.5,  glucose negative, hemoglobin was a large amount, bilirubin was negative,  ketones also negative.  The patient had glycosylated hemoglobin results of  which are still currently pending, but on March 11, 2005, the hemoglobin  A1c was 8.3.   Lipid profile revealed a total cholesterol of 182, an LDL cholesterol of  127, HDL cholesterol of 41.  The patient's serum sodium was 140, potassium  3.3  on admission, glucose 166, BUN 5, AST was 40, ALT was 16, ALP was 661,  total bilirubin was 1.3 and direct bilirubin 0.5.  The PT was 17.4 on  admission and had increased to 22.9 __________ the time of transfer.   Stool for occult blood on January 14, March 17, 2005, was negative.  On  January 21 and March 24, 2005, stool for occult  blood was positive.  White blood cell count on initial presentation 6.7, hemoglobin 14.0,  hematocrit 42.3.  On March 27, 2005, the hemoglobin was 11, hematocrit  33.0.  At the time of discharge, hemoglobin 11, hematocrit 33.   RADIOLOGIC STUDIES:  The patient had abdominal x-ray on March 12, 2005.  No obstruction of free air.  There was evidence of atelectasis or pneumonia  at the left base.  Abdomen CT without contrast revealed no acute  abnormality.  On CT scan of the pelvis, urinary bladder was depressed with a  Foley catheter.  The patient had CT scan of the abdomen which was  unremarkable.  Another x-ray on March 15, 2005,  revealed normal bowel gas  pattern.  Upper GI revealed small sliding hiatal hernia, otherwise  unremarkable.   EKG revealed normal sinus rhythm, right atrial enlargement.   Consultations with endoscopy and colonoscopy revealed a normal exam from  cecum and rectum on the patient's colon; this was performed on March 18, 2005.  The patient also had capsule endoscopy which was also unremarkable.  Upper gastroendoscopy revealed evidence of gastritis.   HOSPITAL COURSE:  The patient was admitted to the hospital for evaluation of  severe abdominal pain, nausea, vomiting and diarrhea.  She has had multiple  admissions in the past and she has had previous surgeries on her abdomen for  adhesions.  It is thought that the patient has problems with functional  bowel problems and possibly irritable bowel as well.  She continued to  complain of severe abdominal pain, nausea, vomiting and diarrhea.  A  consultation was sent to Gastroenterology to evaluate the patient with both  upper and lower endoscopy as well as capsule endoscopy.   Other than the gastroenteritis, no other severe disease was found.  She  continued to complain of vomiting and she was treated symptomatically for  this particular problem.   Patient gradually began to eat very slowly but complained that food did,  indeed, hurt her stomach and so this delayed her nutritional status for  quite some time.   She eventually developed blood in her stool.  She was re-evaluated by  Gastroenterology to determine a source, but the likely source was probably  the gastritis.  She responded very poorly to the proton pump inhibitor  therapy but eventually began to improve.   After she was initially admitted to the hospital she was continued on  Coumadin for a history of chronic atrial fibrillation, was given Plavix, Avapro, Avandamet, Nexium, Zofran and also given Dilaudid on initial  presentation as well as Ambien.   Her Dilaudid was  eventually discontinued.  She was offered p.o. pain  medication but refused.   Additional diagnostic studies included hemoglobin A1c, lipid profile and  acute hepatitis profile.   Stool for C. difficile was obtained and was unremarkable.  This was done  twice and both times results were unremarkable.   She was eventually placed on a full liquid diet which she tolerated fairly  well and began to eat after 1 or 2 weeks of being hospitalized.  The  patient's overall status continued to be fair.  She complained of moderate  to severe abdominal pain and continued bouts of diarrhea; some of this could  be documented, some could not.  Eventually on March 24, 2005, her Flagyl  was discontinued and Cipro to make sure again that we did not induce C.  difficile; again, this was not found.   The patient's overall status remained quite stable.  She continued to have  abdominal pain.  Her blood sugars and other objective measures were  unremarkable and it was felt that the patient had reached maximum hospital  benefit.  She was referred to [facility] for continued  followup for functional improvement.   At discharge, patient will be released on warfarin 5 mg p.o. daily, Plavix  75 mg p.o. daily, Avapro 300 mg p.o. daily, Avandia 4 mg p.o. daily,  Protonix 80 mg p.o. daily, sliding scale insulin coverage.  She will also be  given __________ opthalmic drops 0.005%, one drop in both eyes q.h.s.,  Lyrica 75 mg p.o. b.i.d., Norvasc 10 mg p.o. daily, atorvastatin 20 mg p.o.  daily, glycosamine sulfate 0.375 mg p.o. every 12 hours, fentanyl Duragesic  patch 50 mcg/hr every 72 hours, Glucophage 500 mg p.o. b.i.d., Coumadin 5 mg  p.o. daily, Anusol HC 25 mg suppository b.i.d. and her diet will be  moderately reduced carbohydrate diet with a sodium restriction as well.  She  will be transferred to the skilled nursing facility for continued followup  and nutritional assessment as well as  physical therapy.  She will be  followed by the physician at the skilled nursing facility.      Osvaldo Shipper. Spruill, M.D.  Electronically Signed     JOS/MEDQ  D:  03/28/2005  T:  03/28/2005  Job:  [id number]

## 2010-07-19 NOTE — Discharge Summary (Signed)
Katherine [name], Katherine [name]                 ACCOUNT NO.:  [id number]   MEDICAL RECORD NO.:  [id number]          PATIENT TYPE:  OIB   LOCATION:  5731                         FACILITY:  [facility]   PHYSICIAN:  Salley Scarlet., M.D.DATE OF BIRTH:  [birth year]   DATE OF ADMISSION:  12/07/2003  DATE OF DISCHARGE:                                 DISCHARGE SUMMARY   HISTORY OF PRESENT ILLNESS:  This is a 77 year old diabetic lady who was  admitted for trabeculectomy, right eye, with cataract extraction,  intraocular lens implantation.  She has a history of glaucoma which has been  poorly controlled on maximum medically tolerated medical therapy.  Glaucoma  surgery was recommended, and she was admitted for that purpose.  She also  has an immature cataract, and we recommended that the cataract be removed to  avoid additional surgery.  Therefore, she was brought to the operating room  where trabeculectomy with mitomycin C and immature cataract extraction with  intraocular lens implantation was done without difficulty.  She was admitted  for overnight observation to make sure the pain was adequately controlled  and to make sure that her myriad of other medical problems were adequately  dealt with.  During the night, she had several complaints of pain which were  controlled with Demerol.  This morning, she also complained of the pain, but  the eye status is good.  The cornea was cleared, anterior chamber was deep.  Intraocular lens centered, and the wound is secure.  She is going to be  discharged with instructions to use Forte four times a day along with  Vigamox and Acular four times a day.  She is to continue her glaucoma  medications to the left eye.  She will be given a prescription for Talwin,  which she takes on a routine basis.  She was instructed to see me in the  office on Monday for further evaluation.   DISCHARGE DIAGNOSES:  1.  Glaucoma, poorly controlled, right eye.  2.  Immature cataract,  right eye.       TB/MEDQ  D:  12/08/2003  T:  12/08/2003  Job:  [id number]

## 2010-07-19 NOTE — H&P (Signed)
[name], Katherine [name]                             ACCOUNT NO.:  [id number]   MEDICAL RECORD NO.:  [id number]                   PATIENT TYPE:   LOCATION:                                       FACILITY:   PHYSICIAN:  Larina Earthly, M.D.                 DATE OF BIRTH:  1930-11-12   DATE OF ADMISSION:  02/07/2002  DATE OF DISCHARGE:                                HISTORY & PHYSICAL   CHIEF COMPLAINT:  Carotid artery blockage.   HISTORY OF PRESENT ILLNESS:  The patient  is a 77 year old black female who  in 10/03 was hospitalized with acute stroke.  While undergoing evaluation,  she had Doppler studies performed which showed severe left internal carotid  artery stenosis with no significant stenosis on the right.  She was  stabilized and later transferred to  [facility] where she  currently resides for further rehabilitation. She is making progress  although she continues to have some date difficulties and significant  aphasia.  She has been seen in consultation by Dr. Tawanna Cooler Early with regards  to her left carotid stenosis and it is felt to be in her best interest to  proceed with carotid endarterectomy at this time.  Neurologically, otherwise  she has some memory loss as well as some left eye floaters and generalized  muscle weakness.  She has had no numbness, tingling, syncope or presyncope,  dysphagia or other visual changes such as amaurosis fugax.   PAST MEDICAL HISTORY:  1. Cerebrovascular accident, 10/03.  2. History of prior cerebrovascular accident several years ago.  3. Hypertension.  4. Type 2 noninsulin dependent diabetes mellitus.  5. Coronary artery disease.  6. Glaucoma.  7. Cataracts.  8. Chronic abdominal  pain secondary to adhesions.   PAST SURGICAL HISTORY:  1. Appendectomy.  2. Hysterectomy.  3. Multiple abdominal  surgeries for adhesions and cyst removals.  4. Hemorrhoidectomy.  5. Bilateral breast cysts removed.  6. Cholecystectomy.   CURRENT  MEDICATIONS:  1. Protonix 40 mg q.d.  2. Enteric coated aspirin 325 mg q.d.  3. Vitamin B1 100 mg q.d.  4. Glucotrol XL 5 mg q.d.  5. Alphagan drops one drop O.S. t.i.d.  6. Lotemax 0.5% one drop O.U. q.i.d.  7. Timolol 0.5% one drop O.S. b.i.d.  8. Trusopt 2% one drop O.U. b.i.d.  9. Coumadin 7 mg q.d.  Last taken 02/03/02.  10.      Lantus 10 units q.d.  11.      MiraLax 17 g q.d.  12.      Sliding scale insulin p.r.n.   ALLERGIES:  1. IODINE CAUSES RASH AND HIVES.  2. TYLENOL CAUSES ABDOMINAL  PAIN.  3. PENICILLIN AND SULFA BOTH CAUSE HIVES.   REVIEW OF SYSTEMS:  See history of present illness for pertinent positives.  She is diabetic and her blood sugars   Dictation ended  at this point.      Coral Ceo, P.A.                        Larina Earthly, M.D.    GC/MEDQ  D:  02/03/2002  T:  02/03/2002  Job:  [id number]

## 2010-07-19 NOTE — Discharge Summary (Signed)
[place]  Patient:    Katherine [name], Katherine [name]                        MRN: [id number] Adm. Date:  [id number] Disc. Date: [id number] Attending:  Pola Corn CC:         Osvaldo Shipper. Spruill, M.D.   Discharge Summary  ADMITTING DIAGNOSES:  1. Atypical chest pain, rule out myocardial infarction.  2. Abdominal pain with tractile bleeding, rule out polyp.  3. _________ mass.  4. Hypertension.  5. Insulin dependent diabetes mellitus.  6. Cerebrovascular accident.  7. Tobacco abuse.  8. Multiple surgeries in the past.  DISCHARGE DIAGNOSES:  1. Atypical chest pain, myocardial infarction ruled out.  2. Status post rectal bleeding, probably due to minor colitis, status post     upper endoscopy and colonoscopy.  3. Hypertension.  4. Insulin-requiring diabetes mellitus.  5. Cerebrovascular accident.  6. Tobacco abuse.  7. Multiple surgeries in the past.  8. Hypercholesterolemia.  DISCHARGE MEDICATIONS:  Patient is advised to take her home medications except Coumadin.  Her home medications are:  1. Cardizem one capsule daily.  She does not remember the dose.  2. Glucotrol XL 5 mg daily.  3. Talwin NX one tablet q.8h. as needed.  4. Nitrostat sublingual as needed.  5. Premarin 0.625 mg one tablet daily.  6. Tranxene one tablet twice daily as needed.  7. Patient has been advised to stop Coumadin.  8. Plavix 75 mg one tablet daily with food.  9. Protonix 40 mg one tablet half hour before breakfast daily. 10. Lipitor 10 mg one tablet daily.  ACTIVITY:  As tolerated.  DIET:  Low salt, low cholesterol.  FOLLOW-UP:  Follow up with Dr. Shana Chute and Dr. Tad Moore in two weeks.  CONDITION ON DISCHARGE:  Stable.  BRIEF HISTORY/HOSPITAL COURSE:  Ms. Katherine [name] is a 77 year old black female with a past medical history significant for hypertension and insulin-requiring diabetes mellitus, coronary artery disease, CVA, tobacco abuse, who came to the ER via EMS  complaining of vague, generalized abdominal pain associated with nausea and bleeding per rectum, bright red, very small amount off and on since Sunday, and while on the way the ER, developed left-sided chest pain radiating to the left shoulder without associated symptoms, relieved with sublingual nitroglycerin.  The patient denies palpitation, light-headedness, and syncope, nausea, PND, orthopnea, _________ Persantine Cardiolite in April of 2002 which was negative for ischemia.  The patient denies any urinary complaints, no fever, chills, no vomiting, diarrhea, or constipation.  PAST MEDICAL HISTORY:  As above.  PAST SURGICAL HISTORY:  The patient has had multiple surgeries in the past, i.e., hysterectomy 30+ years ago.  She has ovarian cyst resection many years ago.  She had multiple laparotomies for resection of adhesions, status post hemorrhoidectomy.  She had multiple ureter dilatations.  She has had incisional hernia repairs many years ago.  She had cholecystectomy approximately four years ago.  SOCIAL HISTORY:  She is divorced, on disability, worked as Agricultural engineer at [employer].  She smoked half a pack a day for 30 years.  No alcohol abuse.  FAMILY HISTORY:  Father died of BE here.  He was diabetic.  Mother died of kidney failure.  She was diabetic.  She had an MI at the age of 65.  One sister died of end-stage renal disease.  One brother died of MI at the age of 73.  PHYSICAL EXAMINATION:  General:  She was alert, awake, oriented x 3.  Vital Signs:  Blood pressure was 120/78, pulse was 76.  HEENT:  _________ was pink. Neck supple, no JVD, no bruit.  Lungs clear to auscultation without rhonchi or rales.  Cardiovascular Exam:  S1, S2 was normal.  There was a soft systolic murmur.  Abdomen:  Soft, bowel sounds were present.  Mild generalized tenderness was present.  There was no guarding.  Extremities:  There was no clubbing, cyanosis, or edema.  LABORATORY:   EKG showed normal sinus rhythm.  No ischemic changes were noted. Nonspecific T-wave abnormalities were noted.  Cholesterol was 252, triglycerides 69, LDL 170.  Sodium 139, potassium 4.0, chloride 108, bicarb 29, glucose 138, BUN 9, creatinine 0.6.  Three sets of cardiac enzymes were negative.  Two sets of troponin I were negative. Hemoglobin was 12.7, hematocrit 37.2, white count 4.7.  Repeat hemoglobin on May 28 was 12.3, hematocrit 35.9, white count 6.1, which remained stable.  Her amylase was normal, lipase was high normal range, 52.  BRIEF HOSPITAL COURSE:  The patient was admitted to telemetry unit.  MI was ruled out by serial enzymes and EKG.  The patient did not have any episodes of further GI bleeding.  The patient underwent upper endoscopy and colonoscopy on May 30.  The colonoscopy showed miscellaneous colitis and multiple irritations around the anus.  There were no polyps, masses, or AVMs.  Her endoscopy was also essentially negative.  The patient had not had any episodes of rectal bleeding or chest pain during the hospital stay.  The patient continued to have vague, mild abdominal discomfort without any associated symptoms.  The patient had episodes of bradycardia.  Her beta blocker was slowly tapered off. Her heart rate today has stayed in the 60s.  The patient will be discharge home on the above medications and will be followed up by Dr. Shana Chute and Dr. Tad Moore in two weeks. DD:  08/01/00 TD:  08/01/00 Job: [id number]

## 2010-07-19 NOTE — Discharge Summary (Signed)
NAME:  Katherine [name], Katherine [name]                           ACCOUNT NO.:  [id number]   MEDICAL RECORD NO.:  [id number]                   PATIENT TYPE:  INP   LOCATION:  4741                                 FACILITY:  [facility]   PHYSICIAN:  Osvaldo Shipper. Spruill, M.D.             DATE OF BIRTH:  Apr 27, 1930   DATE OF ADMISSION:  08/08/2002  DATE OF DISCHARGE:                                 DISCHARGE SUMMARY   ADMISSION DIAGNOSES:  1. Recurrent cerebrovascular accident.  2. Coronary artery disease.  3. Type 2 diabetes mellitus.  4. Hypertension.  5. Peripheral vascular disease.  6. Obesity.  7. Anxiety with mild depression.   DISCHARGE DIAGNOSES:  1. Recurrent cerebrovascular accident affecting the right lower extremity.  2. Diabetes.  3. Hypertension.  4. Peripheral vascular disease.  5. Status post left carotid endarterectomy.  6. Obesity.  7. Anxiety with mild depression.   BRIEF HISTORY:  This is one of several admissions for this 77 year old black  woman who had undergone a left carotid endarterectomy in May 2003.  Was  doing fairly well at home until she developed some weakness and difficulty  with speech.  The patient states she was unable to ambulate and reports that  she has been taking her medicines fairly consistently.  The patient was  brought to the emergency room and subsequently was admitted to the hospital  for further evaluation and treatment.  She denies any syncopal episodes, but  does give a history of a prior stroke in October 2003.  The patient has  spent some time at [facility] prior to and shortly after her stroke.  She  had a carotid endarterectomy as a salvage procedure.   MEDICATIONS ON ADMISSION:  1. Benicar 40 mg daily.  2. Potassium chloride 10 mg daily.  3. Tranxene 15 mg t.i.d.  4. Glucotrol 5 mg daily.  5. Coumadin 5 one-half mg tablets daily.  6. Flurazepam 30 mg p.o. h.s. p.r.n. for insomnia.  7. Lasix 20 mg p.o. b.i.d.  8. Colchicine 0.6 mg daily.  9. Nexium 40 mg p.o. daily.  10.      Reglan 10 mg one hour before meals and at bedtime.   LABORATORIES:  The patient was admitted on June 7 and has the following  laboratory data.  White blood cell count on August 06 5298, hemoglobin 12.2,  hematocrit 36.5.  PT 20.5, INR 2.0.  Serum sodium 142, potassium 3.9,  chloride 109, CO2 content 26, glucose 149, BUN 12, creatinine 0.9.  AST,  ALT, ALP all unremarkable.  The patient's urinalysis failed to reveal any  evidence of sugar in her urine.  She had a moderate amount of leukocyte  esterase, few bacteria also noted.  Cardiac enzymes were normal.  The  comprehensive metabolic panel was given previously.  The patient also had  repeat INR on June 9, revealed INR 1.4.  Radiologic studies:  The patient  had MRI of the brain with and without contrast revealing an old left middle  cerebral artery territory stroke without evidence of acute or subacute  progression.  CT scan of the head:  Old left inferior infarct without  evidence of acute intracranial abnormality.  The patient further had a  repeat of her carotid Doppler study which revealed mild heterogeneous plaque  at the bifurcation extending into the posterior wall of the RCA and ECA  origins, a left tortuous ICA.  There appears to be a small area of  homogeneous plaque in the mid ICA.  No evidence of any significant ICA  stenosis.  Vertebral artery flow is antegrade.   HOSPITAL COURSE:  The patient was admitted to the hospital in my absence by  Minerva Areola L. August Saucer, M.D.  She gave signs and symptoms consistent with a stroke,  possibly an embolic type.  The patient was unable to ambulate and was not  able to move her right leg throughout entire admission.   This did not progress as she stayed on anticoagulation.   On June 7 physical therapy and speech therapy were both obtained from stroke  consult and the evaluations were ongoing and included plan to mobilize the  patient at some point.   Carotid  duplex scan and MRI and other studies performed failed to reveal any  evidence of new lesion.  However, the patient does indeed complain of right  leg paralysis.   There is no evidence of coronary event.  No evidence of progression of her  carotid disease.  It is felt the patient has reached maximal hospital  benefit.  The patient will be released to the skilled nursing facility for  additional follow-up and physical therapy at this time.   DISCHARGE MEDICATIONS:  Will be essentially unchanged, but will include:  1. Coumadin 2.5 mg daily.  2. Lasix 20 mg daily.  3. Protonix 80 mg daily.  4. Reglan 5 mg one-half hour a.c. and h.s.  5. Avapro 300 mg p.o. daily.  6. The patient is on currently a sliding scale insulin coverage, but will     probably be placed back on her regular dose upon discharge.  7. She also will be supplied Restoril and Talwin that she takes for chronic     pain.   SPECIAL INSTRUCTIONS AT DISCHARGE:  The patient will be transferred to a  skilled nursing facility and will need intensive occupational and physical  therapy as well as speech therapy due to her problem with continued  dysphagia.   The patient is stabilized and will be seen by the resident physician at the  skilled nursing facility that she is discharged to.                                               Osvaldo Shipper. Spruill, M.D.    JOS/MEDQ  D:  08/10/2002  T:  08/11/2002  Job:  [id number]

## 2010-07-19 NOTE — Op Note (Signed)
NAME:  Katherine [name], Katherine [name]                           ACCOUNT NO.:  [id number]   MEDICAL RECORD NO.:  [id number]                   PATIENT TYPE:  INP   LOCATION:  2899                                 FACILITY:  [facility]   PHYSICIAN:  Larina Earthly, M.D.                 DATE OF BIRTH:  10/08/1930   DATE OF PROCEDURE:  02/07/2002  DATE OF DISCHARGE:                                 OPERATIVE REPORT   PREOPERATIVE DIAGNOSIS:  Severe symptomatic left internal carotid artery  stenosis.   POSTOPERATIVE DIAGNOSIS:  Severe symptomatic left internal carotid artery  stenosis.   OPERATION:  Left carotid endarterectomy, Dacron patch angioplasty.   SURGEON:  Larina Earthly, M.D.   ASSISTANT:  Quita Skye. Hart Rochester, M.D. and Lissa Merlin, PA-C.   ANESTHESIA:  General endotracheal.   COMPLICATIONS:  None.   DISPOSITION:  To recovery room when stable.   DESCRIPTION OF PROCEDURE:  The patient was taken to the operating room and  placed in the supine position where the area of the left neck was prepped  and draped in the usual sterile fashion.  An incision was made in the  anterior sternocleidomastoid and carried down through the platysma with  electrocautery.  The sternocleidomastoid was reflected posteriorly and the  carotid sheath was opened.  The facial vein was ligated with 2-0 silk ties  and divided.  The vagus nerve was identified and preserved.  The common  carotid artery was encircled with an umbilical tape and Rumel tourniquet.  Dissection was carried down to the bifurcation, and the superior thyroid  artery was encircled with a 2-0 silk Potts tie.  The external carotid was  encircled with a blue vessel loop and the internal carotid with encircled  with an umbilical tape and Rumel tourniquet.  The hypoglossal nerve was  identified and preserved.  The patient was given 8000 units of intravenous  heparin.  After adequate circulation time, the internal and external common  carotid arteries were  occluded.  The common carotid artery was opened with  an 11 blade and extended longitudinally with Potts scissors through the  plaque onto the internal carotid artery with Potts scissors.  Endarterectomy was done on the common carotid artery and plaque was divided  proximally with Potts scissors.  The endarterectomy was extended onto the  bifurcation.  The external carotid was endarterectomized with the eversion  technique and the internal carotid was endarterectomized in an open fashion.  The remaining atheromatous debris was removed from the endarterectomy plain.  A Finesse Hemashield Dacron patch was brought onto the field and was sewn as  patch angioplasty with a running 6-0 Prolene suture.  Prior to completion of  the anastomosis, the usual flushing maneuvers were undertaken, the shunt was  removed.  The anastomosis was completed.  The external followed by the  common and finally the internal carotid artery occlusion clamp was removed.  Excellent flow characteristics were noted with hand held Doppler in the  internal and external carotid arteries.  The patient was given 50 mg of  Protamine to reverse the heparin.  The wounds were irrigated with saline.  Hemostasis was obtained with electrocautery.  The wounds were closed with  several 3-0 Vicryl sutures,  reapproximated sternocleidomastoid with the carotid sheath.  Next, the  platysma was closed with running 3-0 Vicryl suture, and finally the skin was  closed with 4-0 subcuticular Vicryl stitch.  Benzoin and Steri-Strips were  applied and the patient was taken to the recovery room neurologically  intact.                                               Larina Earthly, M.D.    TFE/MEDQ  D:  02/07/2002  T:  02/07/2002  Job:  [id number]   cc:   Osvaldo Shipper. Spruill, M.D.  [address] [id number]  [place]  [place] [address]  Fax: [phone]

## 2010-07-19 NOTE — Assessment & Plan Note (Signed)
[facility]                         GASTROENTEROLOGY OFFICE NOTE   NAME:Katherine [name], Katherine [name]                      MRN:          [id number]  DATE:03/06/2006                            DOB:          1930-11-28    REASON FOR REFERRAL:  Dr. Shana Chute asked me to evaluate Katherine [name] in  consultation regarding heme-positive stool.   HISTORY OF PRESENT ILLNESS:  Katherine [name] is a pleasant 77 year old woman  who does not seem to be a very good historian since she suffered a  stroke in 2003.  She tells me she is quite forgetful.  She does,  however, say that she has had relatively black-colored stools for the  past month or so.  She has seen no overt bright red blood per rectum.  Her primary care physician hemocculted her stool and it was positive on  2/3 of the tests.  She had a full colonoscopy with Dr. Melvia Heaps  January 2007 for abdominal pain and bloating and this was completely  normal.  She does not take ibuprofen, Advil, Motrin or Aleve, but she is  on Coumadin and Plavix.   REVIEW OF SYSTEMS:  Notable for 30-pound weight gain in the past year.  The rest of her review of systems is essentially normal and is available  on her nursing intake sheet.   PAST MEDICAL HISTORY:  1. Hypertension.  2. Diabetes.  3. Elevated cholesterol.  4. Stroke.  5. Depression.  6. Sleep apnea.  7. Hysterectomy.  8. Breast surgery.   CURRENT MEDICINES:  Humulin, Tekturna, Timolol, Xalatan, Nexium,  Lipitor, Diovan, diazepam, Coumadin, Plavix.  She has a prescription in  her medicine bag for ciprofloxacin for 10 pills one pill b.i.d. dated  October 2007 which she did not fill.  She is not clear why she was put  on that.  She actually seems quite forgetful about it.   ALLERGIES:  1. MOTRIN.  2. SULFA.  3. TYLENOL.  4. ASPIRIN.  5. IV CONTRAST.  6. SHELLFISH.   SOCIAL HISTORY:  Widow, lives by herself.  Nonsmoker, nondrinker.   FAMILY HISTORY:  No colon cancer  but colon polyps do run in her family.   PHYSICAL EXAMINATION:  VITAL SIGNS:  5 feet 7 inches, 223 pounds, blood  pressure 110/68, pulse 80.  CONSTITUTIONAL:  Seems mildly confused.  She admits that she gets quite  forgetful since she had her stroke.  NEUROLOGIC:  Alert and oriented x3.  EYES:  Extraocular movements intact.  MOUTH:  Oropharynx moist, no lesions.  NECK:  Supple, no lymphadenopathy.  CARDIOVASCULAR:  Heart regular rate and rhythm.  LUNGS:  Clear to auscultation bilaterally.  ABDOMEN:  Soft, nontender, nondistended, normal bowel sounds.  EXTREMITIES:  No lower extremity edema.   ASSESSMENT AND PLAN:  A 77 year old woman with recent black stools, heme  positive, colonoscopy in 2007 by Dr. Melvia Heaps was normal.   She seems somewhat confused about her history.  She does, however,  believe she has had some dark black-like stools in the past month or so.  She did have a CBC within this time showing  a hemoglobin of 11.  Her INR  was 2.1, normal platelets.  Complete metabolic profile was essentially  normal.  She does not take NSAIDs but she is on Coumadin and Plavix.  It  is not clear why she is on the Coumadin or the Plavix but I suspect that  it is because of the stroke that she suffered in 2003.  She has had a  recent colonoscopy just about a year ago now and I do not think that  needs to be repeated.  In terms of colorectal cancer screening, she  probably does not need any screening of any type for at least until  2017.  Since she has had some black stool and is mildly anemic, I will  proceed with upper endoscopy at her soonest convenience.   What concerns me most is her forgetfulness.  She had an unfilled  prescription in her bag.  She lives alone and I am not sure that that is  really safe for her at this point.  She is not clear of much of her  medical history.  I will defer this to her primary care physician, Dr.  Shana Chute.  Perhaps she may need to be evaluated  for assisted living  conditions.     Rachael Fee, MD  Electronically Signed    DPJ/MedQ  DD: 03/06/2006  DT: 03/06/2006  Job #: [id number]   cc:   Katherine [name], M.D.

## 2010-07-19 NOTE — Discharge Summary (Signed)
NAME:  Katherine [name], Katherine [name]                           ACCOUNT NO.:  [id number]   MEDICAL RECORD NO.:  [id number]                   PATIENT TYPE:  INP   LOCATION:  3022                                 FACILITY:  [facility]   PHYSICIAN:  Osvaldo Shipper. Spruill, M.D.             DATE OF BIRTH:  01/28/1932   DATE OF ADMISSION:  12/04/2001  DATE OF DISCHARGE:  12/13/2001                                 DISCHARGE SUMMARY   ADMISSION/PROVISIONAL DIAGNOSES:  1. Acute cerebrovascular accident.  2. Hypertension.  3. Diabetes, non-insulin dependent.  4. Previous cerebrovascular accident.  5. Altered mental status.  6. Tobacco abuse.  7. Multiple abdominal surgeries.  8. Meatal stenosis.  9. Chronic pain in the abdomen, secondary to adhesions.  10.      Nonocclusive coronary artery disease.   DISCHARGE DIAGNOSES:  1. Acute cerebrovascular accident involving the frontal parietal area on the     left.  2. Uncontrolled diabetes mellitus.  3. Uncontrolled type 2 hypertension.  4. Previous cerebrovascular accident.  5. Tobacco abuse.  6. Anxiety.  7. Abdominal pain secondary to adhesions.  8. Left internal carotid artery stenosis.   BRIEF HISTORY/REASON FOR ADMISSION:  This is a 76 year old black female with  a history of diabetes, hypertension, previous stroke, nonocclusive coronary  artery disease, presented to the hospital in a semi obtunded state.  The  patient was admitted to the hospital in my absence by Dr. Rinaldo Cloud,  when it was noted that the patient had abnormal speech and was found at home  on the floor. The patient apparently was getting more and more confused at  home, and she was brought to the hospital for further evaluation as this was  quite new for her. The patient had a previous left parietal stroke in the  past and it was suspected by her family members that she may have had  another stroke.  She was reportedly not responding very much to verbal  command, and was brought for  additional evaluation. The patient currently  lives at home.  She is disabled and divorced.  She also agrees with history  of smoking 1/2 to one pack of cigarettes a day for more than 4 years.   ADMISSION MEDICATIONS:  1. Cardizem CD 120 mg by mouth daily.  2. Lasix 20 mg by mouth twice a day.  3. Potassium 10 mEq by mouth daily.  4. Talwin NX 0.5 mg q.4-6h.  5. Coumadin 5 mg p.o. daily.  6. Benecol 40 mg p.o. daily.  7. Colchicine 0.6 mg p.o. daily.  8. Transene 50 mg p.o. t.i.d.  9. Glucotrol XL 5 mg p.o. daily.   HOSPITAL COURSE:  After the patient was admitted to the hospital, it was  suspected that she had sustained an acute cerebrovascular accident.  She had  additional diagnostic testing which included a CAT scan of the brain, which  did indeed reveal evidence  of possible stroke in the left frontal parietal  area.   Other baseline laboratory studies were obtained on the patient's admission  and included a urine drug study that was unremarkable.  The patient  apparently appeared quite sluggish and there was a concern for this problem  but did not pan out.   The patient's cardiac enzymes were unremarkable as the patient's  electrocardiogram did not reveal any evidence of acute myocardial  infarction.   The patient was placed on a sliding-scale insulin coverage on initial  presentation, and appeared quite lethargic and had difficulty swallowing as  well.  An MRI of the patient's brain did indeed conclude that she had a  stroke. A neurologic consultation was obtained and the suggestion was made  for the patient to go on heparin as well as Coumadin to prevent additional  stroke. After discussion with the patient's family, it was decided that the  patient requested No Code Blue status early on in her presentation. Over the  next few days, however, the patient awakened, began to talk, and became  quite alert.  Her speech was somewhat difficult to understand but the  patient had  regained some function of her voice and was able to carry out a  conversation.   A carotid Doppler scan was ordered and revealed evidence of 80% area  stenosis in the right internal carotid artery. After this was obtained, it  was felt best that the patient undergo possible surgery in the near future  for this carotid artery stenosis in order to avoid additional stroke.  The  patient had been on Coumadin at home.  However, she was not therapeutic and  apparently had a stroke despite being on Coumadin.  On December 08, 2001, a  consult was sent for rehabilitation and the patient was evaluated and it was  felt that the patient would be probably a good candidate for rehabilitation  services.   After cardiovascular thoracic surgery team evaluated the patient for  possible right carotid endarterectomy, it was recommended that the patient  probably undergo this procedure after a four week waiting period.   Just prior to possible release, the patient developed some substernal chest  discomfort and cardiac enzymes and other studies were obtained, did not  reveal any evidence of acute myocardial infarction.  The patient's overall  status appeared to be slowly improving. Her Code Blue status was rescendant  as she had improved.  Overall, the patient appears stable and will likely be  transferred to [facility] while awaiting carotid  artery bypass surgery.   DISCHARGE MEDICATIONS:  1. Aspirin 325 mg.  2. Vitamin B1 100 mg daily.  3. Protonix 40 mg daily.  4. Lotemax 1 drop in the patient's eyes four times a day as requested by her     ophthalmologist.  5. Trusopt 1 drop 2% solution b.i.d. in the eyes.  6. Timoptic 0.5% 1 drop b.i.d. in the left eye.  7. Alphagan 1 drop three times a day in the left eye.  8. She also has a varying dose of Coumadin, 10 mg is the current dose at     this time.  9. The patient will also be on Talwin NX q.4-6h. as needed for pain. 10.       Thicket for her swallowing difficulties.  11.      Bisacodyl 10 mg as needed as a laxative.   SPECIAL INSTRUCTIONS:  The patient may require additional medications for  her diabetes after her appetite and eating habits improve.   The patient will need intensive followup with physical therapy and in  approximately a 3- to 4-week period will likely be readmitted to the  hospital for a possible right carotid endarterectomy surgery.                                                 Osvaldo Shipper. Spruill, M.D.    JOS/MEDQ  D:  12/10/2001  T:  12/13/2001  Job:  [id number]   cc:   Deanna Artis. Sharene Skeans, M.D.  [address]  [place]  [place] [address]  Fax: [phone]   CVTS Office

## 2010-07-19 NOTE — Op Note (Signed)
[facility]  Patient:    Katherine [name], Katherine [name] Visit Number: [id number] MRN: [id number]          Service Type: DSU Location: DAY Attending Physician:  Lindaann Slough Dictated by:   Lindaann Slough, M.D. Proc. Date: 05/13/01 Admit Date:  05/13/2001   CC:         Osvaldo Shipper. Spruill, M.D.   Operative Report  PREOPERATIVE DIAGNOSIS:  Meatal stenosis.  POSTOPERATIVE DIAGNOSIS:  Meatal stenosis.  PROCEDURE:  Cystoscopy and urethral dilation.  SURGEON:  Lindaann Slough, M.D.  ANESTHESIA:  Monitored anesthesia care.  INDICATION:  The patient is a 77 year old female who has been complaining of frequency, hesitancy, and decreased force of the urinary stream. She had had several urethral dilations in the past. She is scheduled today for cystoscopy and ureteral dilation.  Under monitored anesthesia care, the patient was prepped and draped and placed in the dorsal lithotomy position. A #21 Wappler cystoscope was inserted in the bladder. The bladder mucosa is normal. There is no stone or tumor in the bladder. The ureteral orifices are in normal position and shape with clear efflux. There is no evidence of submucosal hemorrhage.  The cystoscope was removed. The urethra was then dilated with #32 [place].  The patient tolerated the procedure well and left the OR in satisfactory condition to postanesthesia care unit. Dictated by:   Lindaann Slough, M.D. Attending Physician:  Lindaann Slough DD:  05/13/01 TD:  05/13/01 Job: [id number]

## 2010-07-19 NOTE — Op Note (Signed)
   Katherine [name], Katherine [name]                          ACCOUNT NO.:  [id number]   MEDICAL RECORD NO.:  [id number]                   PATIENT TYPE:  AMB   LOCATION:  [facility]                                 FACILITY:  [facility]   PHYSICIAN:  Lindaann Slough, M.D.               DATE OF BIRTH:  [birth year]   DATE OF PROCEDURE:  11/02/2001  DATE OF DISCHARGE:                                 OPERATIVE REPORT   PREOPERATIVE DIAGNOSES:  Meatal stenosis.   POSTOPERATIVE DIAGNOSES:  Meatal stenosis.   PROCEDURE:  Cystoscopy and urethral dilation.   SURGEON:  Lindaann Slough, M.D.   ANESTHESIA:  General.   INDICATIONS FOR PROCEDURE:  The patient is a 77 year old female who has had  multiple ureteral dilations in the past. She has been complaining of  frequency, hesitancy and decreased force of the urinary stream for the past  several weeks. She is scheduled today for cystoscopy and ureteral dilation.   DESCRIPTION OF PROCEDURE:  Under general anesthesia, the patient was prepped  and draped and placed in the dorsal lithotomy position. A #22 Wappler  cystoscope was inserted in the bladder. The bladder mucosa is normal. There  is no stone or tumor in the bladder. The ureteral orifices are in normal  position and shape with clear efflux. There is no evidence of submucosal  hemorrhoids.   The cystoscope was removed. The urethra was then dilated with a #32 [place].   The patient tolerated procedure well and left the OR in satisfactory  condition to the post anesthesia care unit.                                               Lindaann Slough, M.D.    MN/MEDQ  D:  11/02/2001  T:  11/02/2001  Job:  [id number]

## 2010-07-19 NOTE — Discharge Summary (Signed)
[place]  Patient:    Katherine [name], Katherine [name] Visit Number: [id number] MRN: [id number]          Service Type: REC Location: [facility] Attending Physician:  Pola Corn Dictated by:   Ricki Rodriguez, M.D. Admit Date:  12/22/2000 Discharge Date: 03/22/2001   CC:         Osvaldo Shipper. Spruill, M.D.   Discharge Summary  REFERRING PHYSICIAN:  Osvaldo Shipper. Spruill, M.D.  PRINCIPAL DIAGNOSES: 1. Angina. 2. Musculoskeletal chest pain. 3. Reflex esophagitis. 4. Hypertension.  DISCHARGE MEDICATIONS:  1. Coumadin 5 mg 1/2 tablet daily.  2. Glucotrol XL 5 mg q.d.  3. Lorazepam 1 mg q.d.  4. Allegra D b.i.d.  5. Cosopt ophthalmic solution 1 drop right eye b.i.d.  6. Cardizem CD 120 mg q.d.  7. ProctoFoam Dalton as needed.  8. Premarin 0.625 mg one q.d.  9. Calcium 600 mg. 10. Vitamin D 1-2 q.d. 11. Lasix 20 mg q.d. 12. Talwin NX 0.5/50 mg q.i.d. p.r.n. 13. ALLHAT (study drugs) one q.d. 14. Colchicine 0.6 mg q.d. 15. Alphagan 0.15% one drop each eye t.i.d. 16. Protonix 40 mg one q.d.  ACTIVITY:  As tolerated.  DIET:  Low-fat, low-salt.  FOLLOW-UP:  Dr. Shana Chute in one month.  HISTORY:  This 77 year old black female complained of left-sided chest pain radiating to the left arm, described as "heaviness off and on" (mostly on the day of admission) along with some shortness of breath and nausea.  She has a past medical history of diabetes, smoking, elevated cholesterol levels, myocardial infarction, premature coronary artery disease in family.  The patient also had multiple operations on her stomach, right breast surgery, left breast surgery, surgery for hemorrhoids, hysterectomy, gallbladder surgery and hernia operations.  PHYSICAL EXAMINATION:  VITAL SIGNS:  Temperature 98, pulse 55, respirations 16, blood pressure 140/80, height 5 feet 7 inches, weight 178 pounds.  GENERAL:  The patient is alert and oriented x2.  HEENT:  Head normocephalic,  atraumatic.  Eyes brown with pupils equal to light and extraocular movement intact.  Ears, nose and throat are pink and moist.  NECK:  No JVD.  LUNGS:  Clear with chest wall tenderness on the left subclavian.  HEART:  Normal S1, S2.  ABDOMEN:  Soft and nontender.  EXTREMITIES:  Left ring finger distal phalanx swelling and tenderness.  This is due to 44-month-old injury from fishing.  Mild clubbing, cyanosis and no edema of lower extremity and without pitting of lower extremity.  CNS:  Unequal grips due to left ring finger swelling and tenderness.  LABORATORY DATA:  Normal hemoglobin, hematocrit, electrolytes, BUN and creatinine.  CK-MB.  Normal urinalysis, except for small protein.  Adenosine Cardiolite stress test showed 63% ejection fraction with no significant abnormality.  CHEST X-RAY:  Questionable left pneumothorax.  Repeat chest x-rays did not show any evidence of pneumothorax.  EKG:  Sinus bradycardia.  HOSPITAL COURSE:  Patient was admitted to telemetry unit.  She had tight chest pain as well as chest wall tenderness.  Was originally admitted to observation unit with left-sided chest pain continuing.  The patient refused to walk on the treadmill, and underwent a nuclear stress test.  This did not show any significant ischemia. The patient was treated medically with the addition of Protonix and continuation of her multiple medications.  She is followed by her primary care physician in one month.  CONDITION ON DISCHARGE:  Stable to improved. Dictated by:   Ricki Rodriguez, M.D. Attending Physician:  Pola Corn DD:  04/16/01 TD:  04/17/01 Job: [id number]

## 2010-07-19 NOTE — Consult Note (Signed)
NAME:  Katherine [name], Katherine [name]                           ACCOUNT NO.:  [id number]   MEDICAL RECORD NO.:  [id number]                   PATIENT TYPE:  INP   LOCATION:  3022                                 FACILITY:  [facility]   PHYSICIAN:  Casimiro Needle L. Thad Ranger, M.D.           DATE OF BIRTH:  01/28/1932   DATE OF CONSULTATION:  DATE OF DISCHARGE:                                   CONSULTATION   REASON FOR EVALUATION:  Suspected stroke and altered mental status.   HISTORY OF PRESENT ILLNESS:  This is the initial inpatient consultation and  evaluation of this 77 year old woman with multiple medical problems  including hypertension, diabetes, and reported history of previous stroke.  On the morning prior to admission that evening, she seemed a  little bit not  herself.  She seemed to be having trouble following conversation and  slurring her speech mildly.  The next morning she was found by a neighbor  confused with very slurred speech.  She was brought to [facility] Emergency  Room for further evaluation.  Her family members deny any previous history  of similar symptoms.  She has continued to wax and wane in the hospital with  episodes of decreased responsiveness.  She had a CT on admission on  12/04/2001, which was said to reveal a old left frontal parietal infarct,  which when the CT was repeated this afternoon appeared to have extended and  we are called for further consultation.  The patient said that she is  feeling fine right now, but it is very difficult to get any sort of history  out of her, thus she provides rambling answers to questions in a very  slurred voice.   PAST MEDICAL HISTORY:  Remarkable for hypertension and diabetes, both under  unknown control.  She does not seem to have any heart problems.  She has  undergone multiple surgical procedures and her family members say that she  has frequent doctor appointments for various problems.   SOCIAL HISTORY:  She lives alone, but has a  nurse's aide which help her with  her medications and with her daily activities and family members also check  on her.  She does continue to smoke.   FAMILY HISTORY:  Remarkable for coronary artery disease.   ALLERGIES:  SHE REPORTS ALLERGIES TO TYLENOL, PENICILLIN, SULFA, IBUPROFEN,  AND CONTRAST MEDIA.   MEDICATIONS:  According to the records, her admission medications include  Cardizem, Lasix, potassium, Talwin NX, Coumadin, Benicar, Caldesene,  Transene 50 mg t.i.d., and Glucotrol.  She also takes Insulin according to  her family.   REVIEW OF SYSTEMS:  Cannot be obtained due to the patient's altered state.  Family members do report that she has been coughing a lot recently and they  were concerned that she might have a pneumonia.   PHYSICAL EXAMINATION:  VITAL SIGNS:  Temperature 99.8, T-MAX 100.7 at  midnight, blood pressure  146/75, pulse 61, respirations 20.  GENERAL:  This is an obese, lethargic appearing female lying in bed in no  evident distress.  HEAD AND NECK:  Cranium normocephalic and atraumatic.  Oropharynx is benign.  Neck is a little stiff, but without meningismus.  There are carotid bruits.  HEART:  Regular rate and rhythm without murmurs.  EXTREMITIES:  No edema, 2+ pulses.  NEUROLOGICAL:  Mental status:  The patient is a little bit lethargic.  She  alerts to voice.  She does not really answer orientation questions and gives  rambling answer to questions.  Her speech is quite slurred.  It is a little  bit dysarthric, but seems to be slurred over and above that.  She is able to  follow one, two, and three step commands.  Cranial nerves:  Pupils are equal  and briskly reactive.  She does not follow a finger well, but does  spontaneously look to either side, but does not even look up and down very  well.  She blinks to threats.  Face, tongue, and palate all seem to move  normally and symmetrically.  Motor:  Difficult to assess, but she does seem  to have a mild  right hemiparesis.  Tone is normal.  Sensory:  She seems to  withdraw to pain on the left better than on the right.  Reflexes are  generally diminished throughout.  Toes are downgoing.  She cannot perform  finger-to-nose adequately.   LABORATORY REVIEW:  INR was 1.0 on admission, she since has been placed on  Heparin, which is therapeutic with a PTT of 94.  CBC and BMET from this  morning are unremarkable.  Cardiac enzymes have been negative.  Urine drug  screen was positive for Benzodiazepines as expected.  Serum ammonia was  negative.  CT's of the head from yesterday and today are personally  reviewed.  The scan from yesterday demonstrates a left cortical  frontoparietal relatively small infarct, which is read out as old, although,  I think that it is at a minimum subacute and maybe as recent as a few days  old.  Today's scan shows the same finding, with some new areas of edema and  apparent infarction a little more anteriorly.   IMPRESSION:  1. Left frontoparietal CT hypodensity.  This is probably an evolving stroke     in the left main coronary artery branch, much less likely an infectious     finding.  2. Mild aphasias and right hemiparesis likely due to #1.  3. Lethargy.  The etiology of this is unclear, but I doubt it is stroke.  It     could represent seizure or possibly hypoglycemia, although, it has not     been happening in the hospital, possibly reaction to medications,     possibly an infectious process.  4. Fever.   RECOMMENDATIONS:  1. Proceed with MRI/MRA.  2. Check EEG.  3. 2-D echo.  4. Carotid Doppler.  5. Hold all sedatives and pain medicines.  6. Would suggest infectious workup.  I have ordered a sedimentation rate and     blood cultures.  7. Change IV fluid to normal Saline.  8. Stroke service will follow with you.  Thank you for the consultation.  Michael L. Thad Ranger, M.D.    MLR/MEDQ  D:  12/05/2001   T:  12/08/2001  Job:  [id number]   cc:   Osvaldo Shipper. Spruill, M.D.  [address] [address]  [place]  [place] [address]  Fax: [phone]

## 2010-07-19 NOTE — Op Note (Signed)
   NAME:  Katherine [name], Katherine [name]                           ACCOUNT NO.:  [id number]   MEDICAL RECORD NO.:  [id number]                   PATIENT TYPE:  AMB   LOCATION:  DAY                                  FACILITY:  [facility]   PHYSICIAN:  Lindaann Slough, M.D.               DATE OF BIRTH:  [birth year]   DATE OF PROCEDURE:  05/17/2002  DATE OF DISCHARGE:                                 OPERATIVE REPORT   PREOPERATIVE DIAGNOSIS:  Meatal stenosis.   POSTOPERATIVE DIAGNOSIS:  Meatal stenosis.   PROCEDURE:  Cystoscopy and urethral dilation.   SURGEON:  Lindaann Slough, M.D.   ANESTHESIA:  Monitored anesthesia care.   INDICATIONS:  The patient is a 77 year old female who has been complaining  of frequency, hesitancy, decreased force of the urinary stream.  She has had  several urethral dilations in the past.  She is scheduled today for  cystoscopy and urethral dilation.   DESCRIPTION OF PROCEDURE:  Under monitored anesthesia care, the patient was  prepped and draped and placed in the dorsal lithotomy position.  A #22  Wappler cystoscope was inserted in the bladder.  The bladder mucosa is  normal.  There is no stone or tumor in the bladder.  The ureteral orifices  are in normal position and shape with clear efflux.   The cystoscope was then removed.  The urethra was dilated with #36 [place].   She tolerated the procedure well.                                               Lindaann Slough, M.D.    MN/MEDQ  D:  05/17/2002  T:  05/17/2002  Job:  [id number]   cc:   Osvaldo Shipper. Spruill, M.D.  [address] [address]  [place]  [place] [address]  Fax: [phone]

## 2010-07-19 NOTE — Op Note (Signed)
[place]. [facility]  Patient:    Katherine [name], Katherine [name]                        MRN: [id number] Proc. Date: 10/16/99 Adm. Date:  [id number] Disc. Date: [id number] Attending:  Lindaann Slough CC:         Osvaldo Shipper. Spruill, M.D.   Operative Report  PREOPERATIVE DIAGNOSIS:  Meatal stenosis.  POSTOPERATIVE DIAGNOSIS:  Meatus stenosis.  OPERATION PERFORMED:  Cystoscopy, urethral dilation.  SURGEON:  Lindaann Slough, M.D.  ANESTHESIA:  Monitored anesthesia care.  INDICATIONS FOR PROCEDURE:  Patient is a 77 year old female who has been complaining of frequency, hesitancy, straining on urination.  She has had several urethral dilations in the past and after each dilation, her voiding symptoms inproved.  She was scheduled today for cystoscopy and ureteral dilation.  DESCRIPTION OF PROCEDURE:  Under monitored anesthesia care patient was prepped and draped and placed in dorsal lithotomy position.  A 23 Wappler cystoscope was inserted in the bladder after instillation of 2% Xylocaine jelly in the urethra.  There was no stone or tumor in the bladder.  The ureteral orifices in normal position and shape with clear efflux.  The cystoscope was then removed.  The urethra was dilated with #32 [place].  The bladder was then emptied.  The patient tolerated the procedure well and left the operating room in satisfactory condition to post anesthesia care unit. DD:  10/16/99 TD:  10/16/99 Job: [id number]

## 2010-07-19 NOTE — Op Note (Signed)
[place]. [facility]  Patient:    Katherine [name]                         MRN: [id number] Proc. Date: 01/31/99 Adm. Date:  [id number] Attending:  Pola Corn                           Operative Report  PREOPERATIVE DIAGNOSIS:  Meatal stenosis.  POSTOPERATIVE DIAGNOSIS:  Meatal stenosis.  OPERATION PERFORMED:  Cystoscopy and urethral dilation.  SURGEON:  Lindaann Slough, M.D.  ANESTHESIA:  Monitored anesthesia care.  INDICATIONS FOR PROCEDURE:  Patient is a 77 year old female who has been complaining of frequency, hesitancy, decreased force of the urinary stream and suprapubic discomfort.  She has been complaining of these symptoms on and off for several years and she has had several urethral dilations in the past.  She is scheduled today for cystoscopy and urethral dilation.  DESCRIPTION OF PROCEDURE:  Under monitored anesthesia care the patient was prepped and draped and placed in dorsal lithotomy position.  A #21 Wappler cystoscope was inserted in the bladder.  The bladder mucosa is normal.  There is no stone or tumor in the bladder.  The ureteral orifices are normal in position and shape.  Clear  efflux.  There is no evidence of submucosal hemorrhage.  Cystoscope was then removed.  The urethra was dilated to #32 [place].  The patient tolerated the procedure well and left the operating room in satisfactory condition to post anesthesia care unit. DD:  01/31/99 TD:  01/31/99 Job: [id number]

## 2010-07-19 NOTE — Consult Note (Signed)
[place]. [facility]  Patient:    Katherine [name], Katherine [name]                        MRN: [id number] Proc. Date: 07/28/00 Adm. Date:  [id number] Attending:  Robynn Pane CC:         Eduardo Osier. Sharyn Lull, M.D.   Consultation Report  HISTORY OF PRESENT ILLNESS:  This pleasant 77 year old black female who has been known to me with a history of abdominal pain for a long period of time. The patient has recently come to see me in the office approximately one week ago with moderate abdominal pain and discomfort that was present.  The pain appeared to be generalized, but more emphasis was on the right side of the abdomen at this time.  The pain did not appear to radiate in any position based on the generalizations that was noted.  She states that she has taken various medications and has been on analgesic medications by her primary physician for this problem at this time.  She states that the symptoms have not improved overall at this time.  The patient is known to be a diabetic presently on Insulin therapy and has subsequently not had any symptoms of hypoglycemia or gastroparesis related to this process that is ongoing.  She does admit to having some diarrhea that has been present at this time, but no history of any constipation that was ongoing or any history of any hematemesis, melena, or hematochezia as noted.  The patient was given Levbid and Levsin in the office and was advised that if the medicines did not help over the next week, that a GI workup would be entertained.  During this process, the patient went home and continued with the medications, but stated that she did not get much of a response.  Because her symptoms progressively worsened, she presented to the emergency room where she was seen and evaluated by the physicians and the EDP physicians at that time.  She was also noted at that time to have positive blood in her stools with bright blood that was noted at  the same time.  Because of this and the history of chest pain that had occurred, the patient was admitted into the hospital at this point.  MEDICATIONS:  1. Insulin both N and R.  2. Premarin.  3. Potassium chloride.  4. Colchicine.  5. Cardizem.  6. Talwin.  7. Coumadin.  8. Glucotrol.  9. Nitrostat. 10. Tranxene.  REVIEW OF SYSTEMS:  Positive for hypertension, phlebitis, peripheral vascular disease, and congestive heart failure that is present.  The patient also has a history of urinary stricture as well as arthritis and diabetes for which she is taking her present medications.  PHYSICAL EXAMINATION:  GENERAL:  She is a pleasant female resting in the bed, but still having abdominal discomfort.  VITAL SIGNS:  Stable.  Blood pressure 140/100, pulse rate 65, respiratory rate 20, and temperature 97.  HEENT:  Positive for arcus senilis.  NECK:  Supple.  LUNGS:  Clear to auscultation and percussion.  HEART:  Regular rate and rhythm without heaves, thrills, murmurs, or gallops noted at this time.  ABDOMEN:  Soft.  There was positive tenderness to light palpation that was generalized.  No hepatosplenomegaly noted.  Bowel sounds were decreased.  RECTAL:  Not done.  EXTREMITIES:  No clubbing, cyanosis, or edema.  LABORATORY DATA:  Hemoglobin 14, hematocrit 40, glucose was 226, BUN 10, creatinine  1, potassium 4.  Cardiac enzymes were negative.  Troponin I level was 0.04 and cholesterol was 176.  EKG showed normal sinus rhythm that was present at this time.  IMPRESSION:  Persistent abdominal pain with associated lower GI bleeding that was present.  Etiology presently unknown at the moment.  Possible differential to consider is diverticulosis that is present.  Peptic ulcer disease (association with her vomiting that is ongoing at this time).  Diabetic gastroparesis can be a contributing factor for her discomfor and pain at this time.  Irritable bowel syndrome which may be  most likely with rectal bleeding as an incidental finding possibly secondary to the bowel movements or anal fissures that could occur.  RECOMMENDATIONS:  Based on her nonresponsiveness and based on her overall condition, I am presently going to start off with a complete GI workup and evaluation of this process to ensure there is no other pathology that could be ongoing and be a contributing factor to her problem.  Depending upon these results will determine the course of therapy. DD:  07/28/00 TD:  07/28/00 Job: [id number]

## 2010-07-19 NOTE — Op Note (Signed)
NAMEDENEICE, WACK                 ACCOUNT NO.:  [id number]   MEDICAL RECORD NO.:  [id number]          PATIENT TYPE:  OIB   LOCATION:  5731                         FACILITY:  [facility]   PHYSICIAN:  Salley Scarlet., M.D.DATE OF BIRTH:  [birth year]   DATE OF PROCEDURE:  12/07/2003  DATE OF DISCHARGE:                                 OPERATIVE REPORT   PREOPERATIVE DIAGNOSES:  1.  Glaucoma, poor control, right eye.  2.  Immature cataract, right eye.   POSTOPERATIVE DIAGNOSES:  1.  Glaucoma, poor control, right eye.  2.  Immature cataract, right eye.   OPERATION:  Trabeculectomy, right eye, using Mitomycin C, with extracapsular  cataract extraction and intraocular lens implantation.   SURGEON:  Nadyne Coombes, M.D.   ANESTHESIA:  Local using Xylocaine 2%, Marcaine 0.75% and Wydase.   JUSTIFICATION FOR PROCEDURE:  This is a 77 year old diabetic lady who is on  maximally tolerated medical therapy for glaucoma.  Her pressure has been  difficult to control for the past several months because of various  illnesses and hospitalizations including a stay at the nursing home.  She  has been un-amenable to glaucoma surgery.  There has been a deterioration in  her visual acuity and an increase in her visual field defect.  She presently  has a visual acuity best corrected to 20/70 on the right and 20/60 on the  left with marked pallor of the optic nerve of the right eye.  She complains  of blurring of vision with difficulty seeing to read.  She was counseled  that because her pressures are difficult to control, even on maximal medical  therapy, that glaucoma surgery is indicated.  We also advised her that since  she has a cataract, in attempts to try to make her vision better, cataract  surgery should be done simultaneously.  She is therefore admitted at this  time for that purpose.   PROCEDURE:  Under influence of IV sedation, a Van Lint akinesia and  retrobulbar anesthesia were given.   The patient was prepped and draped in  the usual manner.  A lid speculum was inserted under the upper and lower  lids of the right eye and a 4-0 silk traction suture was passed through the  belly of the superior rectus muscle for traction.  A limbal-based  conjunctival flap was turned and hemostasis was achieved using cautery.  A  triangular scleral flap was fashioned using the __________.  At this point,  a Mitomycin-soaked Weck-cel pledget was allowed to sit on the sclera beneath  the conjunctiva for approximately 2 minutes.  At the end of this 2-minute  period of time, the pledget was removed and the eye was thoroughly irrigated  with saline.  The triangular scleral flap was then dissected underneath the  cornea.  The Superblade was used to make an incision into the anterior  chamber at approximately the 10:30 o'clock position.  Occucoat was injected  into the eye through this incision.  An anterior capsulotomy was done using  a bent 25-gauge needle.  The eyes was superinflated with Occucoat.  Beneath  the triangular scleral flap, a 3 x 3 section of tissue was removed which  contained the trabeculum.  A peripheral iridectomy was made in the iris.  The corneoscleral wound was then extended first over to the left and to the  right using the corneal scissors.  An 8-0 Vicryl suture was placed across  each arm of the incision as it was made respectively toward the left and  toward the right.  A third one was placed at the apex of the triangular  flap.  The nucleus was then gently, but manually expressed from the eye.  The 2 previously placed sutures were closed and the third one was closed at  the apex of the triangle.  The I/A handpiece was passed into the eye and the  residual cortical material was aspirated.  The posterior capsule was  polished using the olive-tip polisher.  Then a PMMA lens of the EZE-60 type  was passed into the posterior chamber behind the iris without difficulty.  The  anterior chamber was reformed and the pupil was constricted using  Miochol.  The corneoscleral wound was closed by using interrupted sutures of  10-0 nylon.  After ascertaining that the wound was adequately closed, the  conjunctiva was closed over the sclera by using a running suture of 8-0  Vicryl.  The suture was placed in such a fashion as to close the Tenon's  capsule and the conjunctiva in the deep bites.  Celestone 1 mL and 100 mL of  gentamicin were injected subconjunctivally.  Maxitrol ophthalmic ointment  was applied along with a patch and Fox shield.  The patient tolerated the  procedure well and discharged to post-anesthesia recovery room in  satisfactory condition.  Because she has a variety of medical issues  including hypertension and diabetes, and she is post-CVA, she will be  admitted for overnight observation and discharged tomorrow if her condition  is satisfactory.   DISCHARGE DIAGNOSES:  1.  Glaucoma, poor control, right eye.  2.  Immature cataract, right eye.       TB/MEDQ  D:  12/07/2003  T:  12/08/2003  Job:  [id number]

## 2010-07-19 NOTE — Op Note (Signed)
[place]. [facility]  Patient:    [name], [name]                        MRN: [id number] Proc. Date: 06/19/00 Adm. Date:  [id number] Disc. Date: [id number] Attending:  Lindaann Slough CC:         Osvaldo Shipper. Spruill, M.D.   Operative Report  PREOPERATIVE DIAGNOSIS:  Meatal stenosis.  POSTOPERATIVE DIAGNOSIS:  Meatal stenosis.  PROCEDURES:  Cystoscopy and urethral dilation.  SURGEON:  Lindaann Slough, M.D.  ANESTHESIA:  Monitored anesthesia care.  INDICATION:  The patient is a 77 year old female who has had several urethral dilations in the past.  She has recently been again complaining of frequency, hesitancy, straining on urination, and voiding small amount of urine at a time.  She is scheduled today for cystoscopy and urethral dilation.  DESCRIPTION OF PROCEDURE:  Under monitored anesthesia care, the patient was prepped and draped and placed in the dorsal lithotomy position.  A #22 Wappler cystoscope was inserted in the bladder.  The bladder mucosa is normal, and there is no stone or tumor in the bladder.  The ureteral orifices are in normal position and shape with clear efflux.  There was no evidence of submucosal hemorrhage.  The cystoscope was then removed.  The urethra was then dilated with #34 [place].  The patient tolerated the procedure well and left the OR in satisfactory condition to postanesthesia care unit. DD:  06/19/00 TD:  06/20/00 Job: [id number]

## 2010-07-19 NOTE — Op Note (Signed)
[name], Katherine [name]               ACCOUNT NO.:  [id number]   MEDICAL RECORD NO.:  [id number]          PATIENT TYPE:  OIB   LOCATION:  1478                         FACILITY:  [facility]   PHYSICIAN:  Lindaann Slough, M.D.  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   OPERATION PERFORMED:  Cystoscopy and urethral dilation.   SURGEON:  Danae Chen, M.D.   ANESTHESIA:  Monitored anesthesia care.   INDICATIONS:  The patient is a 77 year old female who has been complaining  of frequency, hesitancy, decrease in force of the urinary stream.  She has  history of meatal stenosis and has had several urethral dilations in the  past.  She is scheduled today for cystoscopy and urethral dilation.   DESCRIPTION OF PROCEDURE:  Under general anesthesia, the patient was prepped  and draped and placed in the dorsal lithotomy position.  A #22 Wappler  cystoscope was inserted in the bladder.  The bladder mucosa is normal.  There is no stone or tumor in the bladder.  The ureteral orifices are in  normal position and shape with clear efflux.  The cystoscope was then  removed.  The urethra was dilated up to #32 [place].   The patient tolerated the procedure well and left the operating room in  satisfactory condition to post anesthesia care unit.      Lindaann Slough, M.D.  Electronically Signed     MN/MEDQ  D:  10/24/2005  T:  10/24/2005  Job:  [id number]   cc:   Osvaldo Shipper. Spruill, M.D.  Fax: [phone]

## 2010-07-19 NOTE — Op Note (Signed)
NAMEJANELLI, Katherine [name]                 ACCOUNT NO.:  [id number]   MEDICAL RECORD NO.:  [id number]          PATIENT TYPE:  AMB   LOCATION:  DAY                          FACILITY:  [facility]   PHYSICIAN:  Lindaann Slough, M.D.  DATE OF BIRTH:  12/04/1930   DATE OF PROCEDURE:  02/05/2004  DATE OF DISCHARGE:                                 OPERATIVE REPORT   PREOPERATIVE DIAGNOSIS:  Meatal stenosis.   POSTOPERATIVE DIAGNOSIS:  Meatal stenosis.   PROCEDURE:  Cystoscopy, ureteral dilation.   SURGEON:  Lindaann Slough, M.D.   ANESTHESIA:  General.   INDICATIONS:  Patient is a 77 year old female who has been complaining of  frequency, hesitancy, straining on urination for the past several weeks.  She has had several urethral dilations in the past.  The last one was in  March, 2005.  She is scheduled today for cystoscopy and urethral dilation.   Under general anesthesia, the patient was prepped and draped and placed in  the dorsal lithotomy position.  A #22 Wappler cystoscope was inserted in the  bladder.  The bladder mucosa is normal.  There is no stone or tumor in the  bladder.  The ureteral orifices are in normal position and shape with clear  efflux.  There is no evidence of submucosal hemorrhage.  The cystoscope was  then removed.  The ureter was dilated with a #36 [place].   She tolerated the procedure well and left the OR in satisfactory condition  to the post anesthesia care unit.      MN/MEDQ  D:  02/05/2004  T:  02/05/2004  Job:  [id number]   cc:   Osvaldo Shipper. Spruill, M.D.  [address] [address]  [place]  [place] [address]  Fax: [phone]

## 2010-07-19 NOTE — Assessment & Plan Note (Signed)
HEALTHCARE                         GASTROENTEROLOGY OFFICE NOTE   NAME:Katherine [name], Hechavarria                        MRN:          [id number]  DATE:04/08/2006                            DOB:          [birth year]    PRIMARY CARE PHYSICIAN:  Dr. Shana Chute.   GASTROINTESTINAL PROBLEM LIST:  1. Heme positive stool, mild anemia with a hemoglobin of 11 on      Coumadin. Status post colonoscopy by Dr. Barnet Pall January 2007,      was normal.  Status post EGD January 2008 by me, was normal.   INTERVAL HISTORY:  I last saw Katherine [name] at the time of her upper endoscopy.  That was normal.  I thought that since her hemoglobin was 11 and she was  on Coumadin when she did have some heme positive stool that I thought it  was reasonable for her to just be observed clinically.  For colorectal  cancer screening she does not need another colonoscopy until 2017.  She  had a very small amount of bright red blood per rectum yesterday, and  none since, and none prior to then.  She is here mainly because she has  some swelling in her lower extremities and she has had some discomforts  in her right upper quadrant.  She is a pretty poor historian.  The  discomforts in her abdomen seem to be somewhat related to position, it  is not clear that they are necessary abdominal or biliary in origin.   CURRENT MEDICATIONS:  Humulin, Tekturna, timolol, __________, Nexium,  Lipitor, Diovan, diazepam, Coumadin, Plavix, Avandia, Lasix, Protonix.   PHYSICAL EXAMINATION:  Weight 226 pounds, which is 3 pounds higher than  when I saw her a month ago.  Blood pressure 112/72, pulse 80.  CONSTITUTIONAL:  Obese, otherwise well appearing.  NEUROLOGIC:  Somewhat confused, poor historian, but overall alert and  oriented x3.  LUNGS:  Clear to auscultation bilaterally.  HEART:  Regular rate and rhythm.  ABDOMEN:  Soft, nontender, nondistended, normal bowel sounds, large  midline scar I believe from previous  complicated hysterectomy.   ASSESSMENT/PLAN:  A 77 year old woman with limited rectal bleeding in  the setting of Coumadin, right upper quadrant discomforts.   First, her limited rectal bleeding is unlikely anything serious since  she had a negative colonoscopy about a year ago and she is on Coumadin.  I think she could just simply be observed for that.  Her right upper  quadrant discomforts are vague and do not seem particularly abdominal  from her history, but she is a pretty poor historian.  I think to be  safe we should get a complete metabolic profile.  Check her liver tests  as well as a right upper quadrant ultrasound.  I will contact her with  those results.  As far as her swelling, she has maybe 1+ pitting edema  now.  She is only up about 3 pounds since her last visit a month ago, but I  recommended she get in touch with Dr. Shana Chute about that.     Rachael Fee, MD  Electronically Signed  DPJ/MedQ  DD: 04/08/2006  DT: 04/08/2006  Job #: [id number]   cc:   Osvaldo Shipper. Spruill, M.D.

## 2010-07-19 NOTE — Discharge Summary (Signed)
NAME:  Katherine [name], Katherine [name]                           ACCOUNT NO.:  [id number]   MEDICAL RECORD NO.:  [id number]                   PATIENT TYPE:  INP   LOCATION:  2035                                 FACILITY:  [facility]   PHYSICIAN:  Osvaldo Shipper. Spruill, M.D.             DATE OF BIRTH:  05-22-1930   DATE OF ADMISSION:  07/05/2002  DATE OF DISCHARGE:  07/12/2002                                 DISCHARGE SUMMARY   DISCHARGE DIAGNOSES:  1. Chest pain.  2. Hypertension.  3. Non-insulin dependent diabetes mellitus.  4. Cerebrovascular disease.   HISTORY:  This is a 77 year old patient who presented initially to the  emergency department with complaint of generally not feeling well,  indigestion like chest pain and inability to swallow water normally. She  stated that this problem gets worse when she attempts to take deep breaths.  She has a decrease in her appetite. It is of note that the patient has had  cerebrovascular accidents in the past with chronic speech changes. There was  also a complaint of left-sided neck pain at the site of previous  endarterectomy. The patient on examination was noted to have epigastric area  tenderness. The pulse oximetry was 98% on room air. The electrocardiogram  revealed some nonspecific ST-T wave changes at 65 beats per minute. The  patient was subsequently admitted for evaluation and to rule out the  possibility of other cardiac related problems.   HOSPITAL COURSE:  The patient was seen by pharmacy for Lovenox, Coumadin  protocol. The patient was scheduled for a Persantine Cardiolite study. There  was no evidence of left ventricular myocardial infarction or ischemia and  normal left ventricular ejection fraction was calculated. The patient had  serial cardiac enzymes. The initial troponin was 0.03. Follow-up was 0.01.  The CK and CK-MB remained well within normal limits throughout the  hospitalization. It is of note that the glycosylated hemoglobin was  elevated  at 11 and the cholesterol was elevated at 265. Triglycerides were elevated  at 196.   The patient was seen by the diabetes coordinator for assistance with  diabetic training.   The patient complained of some abdominal pain and received a KUB and labs.  The KUB was normal. The lipase and amylase was also normal.   Plans were then made for [facility] RN visit and will proceed  into assisted living plans with the family.   DISCHARGE MEDICATIONS:  The patient is to resume her home medications which  include:  1. Colchicine 0.6 mg daily.  2. Metoprolol 10 mg two times daily.  3. Benicar 40 mg daily.  4. Coumadin 5 mg one-half tablet daily.  5. Lasix 20 mg b.i.d.  6. Potassium 10 mEq daily.  7. Nexium 40 mg daily.  8. Glipizide 5 mg each morning.  9. Talwin 1 every 4 hours as needed.  10.      Coumadin  5 mg every other day.  11.      Lorazepam at bedtime.  12.      70/30 insulin 45 units in the morning and 25 units in the evening.   DISCHARGE INSTRUCTIONS:  The patient is to notify the physician immediately  if any changes, problems, or concerns.     Ivery Quale, P.A.                       Osvaldo Shipper. Spruill, M.D.    HB/MEDQ  D:  09/14/2002  T:  09/14/2002  Job:  [id number]

## 2010-07-19 NOTE — Discharge Summary (Signed)
NAME:  Katherine [name], Katherine [name]                           ACCOUNT NO.:  [id number]   MEDICAL RECORD NO.:  [id number]                   PATIENT TYPE:  INP   LOCATION:  2040                                 FACILITY:  [facility]   PHYSICIAN:  Larina Earthly, M.D.                 DATE OF BIRTH:  1930/07/12   DATE OF ADMISSION:  02/07/2002  DATE OF DISCHARGE:  02/11/2002                                 DISCHARGE SUMMARY   PHYSICIANS:  1. Dr. Trina Ao, primary physician.  2. Osvaldo Shipper. Spruill, M.D., cardiology.   ADMISSION DIAGNOSES:  1. Severe left internal carotid artery stenosis with previous left brain     cerebrovascular accident.  2. Hypertension.  3. Noninsulin-dependent diabetes mellitus.  4. Coronary artery disease.  5. Glaucoma.  6. Cataracts.  7. Chronic abdominal pain secondary to adhesions.  8. Chronic Coumadin therapy prior to admission.   PROCEDURE:  Left carotid endarterectomy with Dacron patch angioplasty on  February 07, 2002 by Dr. Kristen Loader. Early.   BRIEF HISTORY:  The patient was a 77 year old female who was hospitalized  October 2003 with a stroke.  While undergoing evaluation, doppler showed a  severe left internal carotid artery stenosis.  There was no significant  stenosis on the right.  She was taken to the extended care center where she  underwent rehabilitation.  She made improvement with regard to her  neurologic deficit.  Dr. Kristen Loader. Early was consulted and carotid  endarterectomy was recommended.   HOSPITAL COURSE:  She came in to the hospital for the elective procedure on  February 07, 2002.  There were no complications.  She was taken to the PACU  in stable condition.  She was somewhat slow to mobilize postoperatively  requiring a few days to get her strength back.  Physical therapy worked with  her using the rolling walker and she improved.   By February 11, 2002, she was improved.  Vital signs were stable.  She was  afebrile.  Neck wound was fine.  There were no new  neurological deficits.  She is discharged in stable condition.   DISCHARGE MEDICATIONS:  1. She was told to resume her home medications.  2. She was given Ultram 50 mg 1-2 q.4-6h. p.r.n. pain.   SPECIAL INSTRUCTIONS:  She was told to avoid driving, working, heavy  lifting, strenuous activity.  She was told she could shower and to clean her  wound gently daily with soap and water.  Call the office if there were any  concerns.   DISPOSITION:  Home.   CONDITION ON DISCHARGE:  Stable.   FOLLOW UP:  Dr. Kristen Loader. Early Wednesday, March 02, 2002 at 10:30 a.m.      Lissa Merlin, P.A.                          Larina Earthly,  M.D.    JL/MEDQ  D:  05/16/2002  T:  05/16/2002  Job:  [id number]

## 2010-07-19 NOTE — Op Note (Signed)
NAME:  Katherine [name], Katherine [name]                           ACCOUNT NO.:  [id number]   MEDICAL RECORD NO.:  [id number]                   PATIENT TYPE:  AMB   LOCATION:  DAY                                  FACILITY:  [facility]   PHYSICIAN:  Lindaann Slough, M.D.               DATE OF BIRTH:  03/03/1931   DATE OF PROCEDURE:  05/12/2003  DATE OF DISCHARGE:                                 OPERATIVE REPORT   PREOPERATIVE DIAGNOSES:  Meatal stenosis.   POSTOPERATIVE DIAGNOSES:  Meatal stenosis.   PROCEDURE:  Cystoscopy and urethral dilation.   SURGEON:  Lindaann Slough, M.D.   ANESTHESIA:  Monitored anesthesia care.   INDICATIONS FOR PROCEDURE:  The patient is a 77 year old female who has been  complaining of frequency, hesitancy, decreased force of the urinary stream  and straining with urination.  She has had multiple urethral dilations in  the past.  She is scheduled for cystoscopy and urethral dilation under  anesthesia.   Under monitored anesthesia care, the patient was prepped and draped and  placed in the dorsal lithotomy position.  A #22 Wappler cystoscope was  inserted in the bladder.  The bladder mucosa is normal. There is no stone or  tumor in the bladder.  The ureteral orifices are in normal position and  shape.  There is no evidence of submucosal hemorrhage.   The cystoscope was then removed. The urethra was then dilated with a #34  [place].   The patient tolerated the procedure well and left the OR in satisfactory  condition to post anesthesia care unit.                                               Lindaann Slough, M.D.    MN/MEDQ  D:  05/12/2003  T:  05/12/2003  Job:  [id number]   cc:   Osvaldo Shipper. Spruill, M.D.  [address] [address]  [place]  [place] [address]  Fax: [phone]

## 2010-07-19 NOTE — Op Note (Signed)
[place]  Patient:    Katherine [name], Katherine [name]                        MRN: [id number] Proc. Date: 05/29/99 Adm. Date:  [id number] Disc. 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  PROCEDURE:  Cystoscopy, urethral dilation.  SURGEON:  Lindaann Slough, M.D.  ANESTHESIA:  Monitored anesthesia care.  INDICATIONS:  The patient is a 77 year old female who has been complaining of frequency, hesitancy, decreased force of the urinary stream, and voiding small amount of urine at a time.  She has had several dilations in the past.  She is scheduled today for cystoscopy and urethral dilation.  DESCRIPTION OF PROCEDURE:  Under monitored anesthesia care, the patient was prepped and draped and placed in the dorsal lithotomy position.  A #17 Wappler cystoscope was inserted in the bladder.  The bladder mucosa is normal.  There is no stone r tumor in the bladder.  The ureteral orifices are in normal position and shape with clear efflux.  The cystoscope was then removed.  The urethra was then dilated with #32 [place].  The patient tolerated the procedure well and left the OR in satisfactory condition to postanesthesia care unit. DD:  05/29/99 TD:  05/30/99 Job: [id number]

## 2010-08-06 ENCOUNTER — Emergency Department (HOSPITAL_COMMUNITY): Payer: Medicare Other

## 2010-08-06 ENCOUNTER — Emergency Department (HOSPITAL_COMMUNITY)
Admission: EM | Admit: 2010-08-06 | Discharge: 2010-08-06 | Disposition: A | Discharge status: Home / Self Care | Payer: Medicare Other | Attending: Emergency Medicine | Admitting: Emergency Medicine

## 2010-08-06 DIAGNOSIS — Z79899 Other long term (current) drug therapy: Secondary | ICD-10-CM | POA: Insufficient documentation

## 2010-08-06 DIAGNOSIS — M79609 Pain in unspecified limb: Secondary | ICD-10-CM

## 2010-08-06 DIAGNOSIS — R609 Edema, unspecified: Secondary | ICD-10-CM | POA: Insufficient documentation

## 2010-08-06 DIAGNOSIS — R072 Precordial pain: Secondary | ICD-10-CM | POA: Insufficient documentation

## 2010-08-06 DIAGNOSIS — K219 Gastro-esophageal reflux disease without esophagitis: Secondary | ICD-10-CM | POA: Insufficient documentation

## 2010-08-06 DIAGNOSIS — I10 Essential (primary) hypertension: Secondary | ICD-10-CM | POA: Insufficient documentation

## 2010-08-06 DIAGNOSIS — M7989 Other specified soft tissue disorders: Secondary | ICD-10-CM | POA: Insufficient documentation

## 2010-08-06 DIAGNOSIS — R0602 Shortness of breath: Secondary | ICD-10-CM | POA: Insufficient documentation

## 2010-08-06 DIAGNOSIS — E119 Type 2 diabetes mellitus without complications: Secondary | ICD-10-CM | POA: Insufficient documentation

## 2010-08-06 DIAGNOSIS — Z8673 Personal history of transient ischemic attack (TIA), and cerebral infarction without residual deficits: Secondary | ICD-10-CM | POA: Insufficient documentation

## 2010-08-06 DIAGNOSIS — R1031 Right lower quadrant pain: Secondary | ICD-10-CM | POA: Insufficient documentation

## 2010-08-06 DIAGNOSIS — E785 Hyperlipidemia, unspecified: Secondary | ICD-10-CM | POA: Insufficient documentation

## 2010-08-06 DIAGNOSIS — R0601 Orthopnea: Secondary | ICD-10-CM | POA: Insufficient documentation

## 2010-08-06 DIAGNOSIS — R1110 Vomiting, unspecified: Secondary | ICD-10-CM | POA: Insufficient documentation

## 2010-08-06 DIAGNOSIS — R197 Diarrhea, unspecified: Secondary | ICD-10-CM | POA: Insufficient documentation

## 2010-08-06 DIAGNOSIS — H409 Unspecified glaucoma: Secondary | ICD-10-CM | POA: Insufficient documentation

## 2010-08-06 LAB — URINE MICROSCOPIC-ADD ON

## 2010-08-06 LAB — CBC
HCT: 35.3 % — ABNORMAL LOW (ref 36.0–46.0)
MCV: 86.5 fL (ref 78.0–100.0)
Platelets: 367 10*3/uL (ref 150–400)
RBC: 4.08 MIL/uL (ref 3.87–5.11)
RDW: 14.7 % (ref 11.5–15.5)
WBC: 6.1 10*3/uL (ref 4.0–10.5)

## 2010-08-06 LAB — DIFFERENTIAL
Basophils Absolute: 0 10*3/uL (ref 0.0–0.1)
Eosinophils Relative: 1 % (ref 0–5)
Lymphocytes Relative: 47 % — ABNORMAL HIGH (ref 12–46)
Lymphs Abs: 2.9 10*3/uL (ref 0.7–4.0)
Neutrophils Relative %: 42 % — ABNORMAL LOW (ref 43–77)

## 2010-08-06 LAB — URINALYSIS, ROUTINE W REFLEX MICROSCOPIC
Bilirubin Urine: NEGATIVE
Glucose, UA: NEGATIVE mg/dL
Hgb urine dipstick: NEGATIVE
Ketones, ur: NEGATIVE mg/dL
Protein, ur: NEGATIVE mg/dL
Urobilinogen, UA: 0.2 mg/dL (ref 0.0–1.0)

## 2010-08-06 LAB — BASIC METABOLIC PANEL
CO2: 30 mEq/L (ref 19–32)
Glucose, Bld: 111 mg/dL — ABNORMAL HIGH (ref 70–99)
Potassium: 3.8 mEq/L (ref 3.5–5.1)
Sodium: 140 mEq/L (ref 135–145)

## 2010-08-07 ENCOUNTER — Ambulatory Visit: Payer: Medicare Other

## 2010-08-07 LAB — URINE CULTURE
Colony Count: 3000
Culture  Setup Time: 201206051949

## 2010-08-12 ENCOUNTER — Ambulatory Visit
Admission: RE | Admit: 2010-08-12 | Discharge: 2010-08-12 | Disposition: A | Discharge status: Home / Self Care | Payer: Medicare Other | Source: Ambulatory Visit | Attending: Cardiology | Admitting: Cardiology

## 2010-08-12 DIAGNOSIS — Z1231 Encounter for screening mammogram for malignant neoplasm of breast: Secondary | ICD-10-CM

## 2010-11-18 ENCOUNTER — Other Ambulatory Visit (HOSPITAL_COMMUNITY): Payer: Self-pay | Admitting: Cardiology

## 2010-11-25 ENCOUNTER — Ambulatory Visit (HOSPITAL_COMMUNITY)
Admission: RE | Admit: 2010-11-25 | Discharge: 2010-11-25 | Payer: Medicare Other | Source: Ambulatory Visit | Attending: Cardiology | Admitting: Cardiology

## 2010-11-25 ENCOUNTER — Encounter (HOSPITAL_COMMUNITY)
Admission: RE | Admit: 2010-11-25 | Discharge: 2010-11-25 | Disposition: A | Discharge status: Home / Self Care | Payer: Medicare Other | Source: Ambulatory Visit | Attending: Cardiology | Admitting: Cardiology

## 2010-11-25 ENCOUNTER — Ambulatory Visit (HOSPITAL_COMMUNITY): Admission: RE | Admit: 2010-11-25 | Payer: Medicare Other | Source: Ambulatory Visit

## 2010-11-25 DIAGNOSIS — R079 Chest pain, unspecified: Secondary | ICD-10-CM | POA: Insufficient documentation

## 2010-11-25 MED ORDER — TECHNETIUM TC 99M TETROFOSMIN IV KIT
10.0000 | PACK | Freq: Once | INTRAVENOUS | Status: AC | PRN
  Administered 2010-11-25: 10 via INTRAVENOUS

## 2010-11-25 MED ORDER — TECHNETIUM TC 99M TETROFOSMIN IV KIT
30.0000 | PACK | Freq: Once | INTRAVENOUS | Status: AC | PRN
  Administered 2010-11-25: 30 via INTRAVENOUS

## 2010-11-28 LAB — HEMOGLOBIN A1C: Hgb A1c MFr Bld: 7.4 — ABNORMAL HIGH

## 2010-11-28 LAB — PROTIME-INR
INR: 1.6 — ABNORMAL HIGH
Prothrombin Time: 20.2 — ABNORMAL HIGH

## 2010-11-28 LAB — DIFFERENTIAL
Basophils Relative: 1
Eosinophils Absolute: 0.1
Eosinophils Relative: 3
Monocytes Relative: 8
Neutrophils Relative %: 47

## 2010-11-28 LAB — BASIC METABOLIC PANEL
BUN: 18
CO2: 24
Chloride: 102
Creatinine, Ser: 0.78

## 2010-11-28 LAB — CBC
MCHC: 33.3
MCV: 86.7
Platelets: 328
RBC: 4.13

## 2010-11-28 LAB — POCT CARDIAC MARKERS: Operator id: 151321

## 2010-11-29 LAB — PROTIME-INR
INR: 1.7 — ABNORMAL HIGH
INR: 1.7 — ABNORMAL HIGH
INR: 1.9 — ABNORMAL HIGH
INR: 1.9 — ABNORMAL HIGH
INR: 2.1 — ABNORMAL HIGH
INR: 2.3 — ABNORMAL HIGH
INR: 2.5 — ABNORMAL HIGH
INR: 2.6 — ABNORMAL HIGH
Prothrombin Time: 20.8 — ABNORMAL HIGH
Prothrombin Time: 22.5 — ABNORMAL HIGH
Prothrombin Time: 22.8 — ABNORMAL HIGH
Prothrombin Time: 24.7 — ABNORMAL HIGH
Prothrombin Time: 26.9 — ABNORMAL HIGH
Prothrombin Time: 29 — ABNORMAL HIGH
Prothrombin Time: 29.7 — ABNORMAL HIGH

## 2010-11-29 LAB — CBC
HCT: 29.5 — ABNORMAL LOW
HCT: 30.4 — ABNORMAL LOW
HCT: 30.8 — ABNORMAL LOW
HCT: 31.1 — ABNORMAL LOW
HCT: 31.5 — ABNORMAL LOW
HCT: 32.1 — ABNORMAL LOW
HCT: 32.5 — ABNORMAL LOW
HCT: 33.9 — ABNORMAL LOW
Hemoglobin: 10.2 — ABNORMAL LOW
Hemoglobin: 10.3 — ABNORMAL LOW
Hemoglobin: 10.9 — ABNORMAL LOW
Hemoglobin: 9.8 — ABNORMAL LOW
MCHC: 32.3
MCHC: 32.8
MCHC: 33.1
MCHC: 33.2
MCHC: 33.7
MCV: 86.2
MCV: 86.4
MCV: 86.5
MCV: 86.6
MCV: 87.4
MCV: 87.5
Platelets: 277
Platelets: 277
Platelets: 278
Platelets: 291
Platelets: 301
Platelets: 301
Platelets: 308
Platelets: 312
Platelets: 320
RBC: 3.41 — ABNORMAL LOW
RBC: 3.56 — ABNORMAL LOW
RBC: 3.87
RDW: 14.2
RDW: 14.5
RDW: 14.6
RDW: 14.6
RDW: 14.7
RDW: 14.8
RDW: 14.9
RDW: 14.9
RDW: 15
WBC: 4.2
WBC: 4.5
WBC: 5
WBC: 5.4
WBC: 5.5
WBC: 5.6
WBC: 6

## 2010-11-29 LAB — BASIC METABOLIC PANEL
BUN: 11
BUN: 13
CO2: 25
Calcium: 9.1
Calcium: 9.3
Chloride: 107
Creatinine, Ser: 0.8
Creatinine, Ser: 0.95
GFR calc non Af Amer: 58 — ABNORMAL LOW
Glucose, Bld: 117 — ABNORMAL HIGH
Potassium: 4.2

## 2010-11-29 LAB — HEPARIN LEVEL (UNFRACTIONATED)
Heparin Unfractionated: 0.41
Heparin Unfractionated: 0.46
Heparin Unfractionated: 0.54
Heparin Unfractionated: 0.57
Heparin Unfractionated: 0.66
Heparin Unfractionated: 0.81 — ABNORMAL HIGH
Heparin Unfractionated: 0.81 — ABNORMAL HIGH

## 2010-12-04 LAB — GLUCOSE, CAPILLARY
Glucose-Capillary: 152 — ABNORMAL HIGH
Glucose-Capillary: 227 — ABNORMAL HIGH

## 2010-12-04 LAB — PROTIME-INR
INR: 1
Prothrombin Time: 13.2

## 2010-12-04 LAB — CBC
MCHC: 32.9
MCV: 86.5
Platelets: 377
WBC: 4.9

## 2010-12-04 LAB — BASIC METABOLIC PANEL
BUN: 18
Chloride: 102
Creatinine, Ser: 0.71

## 2011-06-15 ENCOUNTER — Emergency Department (HOSPITAL_COMMUNITY)
Admission: EM | Admit: 2011-06-15 | Discharge: 2011-06-15 | Disposition: A | Discharge status: Home Health | Payer: Medicare Other | Attending: Emergency Medicine | Admitting: Emergency Medicine

## 2011-06-15 DIAGNOSIS — L0390 Cellulitis, unspecified: Secondary | ICD-10-CM

## 2011-06-15 DIAGNOSIS — M7989 Other specified soft tissue disorders: Secondary | ICD-10-CM | POA: Insufficient documentation

## 2011-06-15 DIAGNOSIS — M79609 Pain in unspecified limb: Secondary | ICD-10-CM

## 2011-06-15 DIAGNOSIS — Z79899 Other long term (current) drug therapy: Secondary | ICD-10-CM | POA: Insufficient documentation

## 2011-06-15 DIAGNOSIS — L02419 Cutaneous abscess of limb, unspecified: Secondary | ICD-10-CM | POA: Insufficient documentation

## 2011-06-15 DIAGNOSIS — L03119 Cellulitis of unspecified part of limb: Secondary | ICD-10-CM | POA: Insufficient documentation

## 2011-06-15 MED ORDER — TRAMADOL HCL 50 MG PO TABS: 50.0000 mg | ORAL_TABLET | Freq: Four times a day (QID) | ORAL | Status: AC | PRN

## 2011-06-15 MED ORDER — CEPHALEXIN 500 MG PO CAPS: 500.0000 mg | ORAL_CAPSULE | Freq: Four times a day (QID) | ORAL | Status: AC

## 2011-06-15 MED ORDER — CEPHALEXIN 250 MG PO CAPS
1000.0000 mg | ORAL_CAPSULE | Freq: Once | ORAL | Status: AC
  Administered 2011-06-15: 1000 mg via ORAL
  Filled 2011-06-15: qty 4

## 2011-06-15 MED ORDER — TRAMADOL HCL 50 MG PO TABS
50.0000 mg | ORAL_TABLET | Freq: Once | ORAL | Status: AC
  Administered 2011-06-15: 50 mg via ORAL
  Filled 2011-06-15: qty 1

## 2011-06-15 MED ORDER — HYDROCODONE-ACETAMINOPHEN 5-325 MG PO TABS
2.0000 | ORAL_TABLET | Freq: Once | ORAL | Status: DC
  Filled 2011-06-15: qty 2

## 2011-06-15 NOTE — ED Provider Notes (Signed)
History     CSN: [id number]  Arrival date & time 06/15/11  1630   First MD Initiated Contact with Patient 06/15/11 1646      Chief Complaint  Patient presents with  . Leg Swelling    (Consider location/radiation/quality/duration/timing/severity/associated sxs/prior treatment) The history is provided by the patient.  pt c/o right lower leg pain and swelling for past 3 months. States bilateral legs had been very swollen for months/years. States the swelling went down as of January, but that then she developed painful, dry, cracked, erythematous skin to right lower leg anteriorly in January. States since then the redness, scaliness, pain and swelling has never gone away, and that it past week it is worse. States pcp gave rx for abx but she didn't fill. No fever or chills. Does not feel ill. No nv. No weakness/faintness. Denies injury to leg. No numbness/weakness. States today there was small amount bleeding for this area, so she came to get checked. Notes remote hx dvt left leg approximately 3-4 years ago. No coumadin use. Denies any cp or sob.   No past medical history on file.  No past surgical history on file.  No family history on file.  History  Substance Use Topics  . Smoking status: Not on file  . Smokeless tobacco: Not on file  . Alcohol Use: Not on file    OB History    No data available      Review of Systems  Constitutional: Negative for fever and chills.  HENT: Negative for neck pain.   Eyes: Negative for redness.  Respiratory: Negative for shortness of breath.   Cardiovascular: Negative for chest pain.  Gastrointestinal: Negative for abdominal pain.  Genitourinary: Negative for flank pain.  Musculoskeletal: Negative for back pain.  Skin: Negative for rash.  Neurological: Negative for headaches.  Hematological: Does not bruise/bleed easily.  Psychiatric/Behavioral: Negative for confusion.    Allergies  Iohexol  Home Medications   Current Outpatient Rx    Name Route Sig Dispense Refill  . AMLODIPINE BESYLATE-VALSARTAN 5-160 MG PO TABS Oral Take 1 tablet by mouth daily.    . ATORVASTATIN CALCIUM 40 MG PO TABS Oral Take 40 mg by mouth daily.    Marland Kitchen BIMATOPROST 0.01 % OP SOLN Both Eyes Place 1 drop into both eyes at bedtime.    Marland Kitchen BRIMONIDINE TARTRATE 0.1 % OP SOLN Both Eyes Place 1 drop into both eyes every 8 (eight) hours.     . COLCHICINE 0.6 MG PO TABS Oral Take 0.6 mg by mouth daily.    . DORZOLAMIDE HCL 2 % OP SOLN Both Eyes Place 1 drop into both eyes 2 (two) times daily.    . FEBUXOSTAT 40 MG PO TABS Oral Take 40 mg by mouth 2 (two) times daily.    . INSULIN DETEMIR 100 UNIT/ML [place] SOLN Subcutaneous Inject 100 Units into the skin at bedtime.    Marland Kitchen LOPERAMIDE HCL 2 MG PO CAPS Oral Take 2 mg by mouth 4 (four) times daily as needed. For diarrhea    . LORAZEPAM 0.5 MG PO TABS Oral Take 0.5 mg by mouth every 8 (eight) hours.    . OMEPRAZOLE 40 MG PO CPDR Oral Take 40 mg by mouth 2 (two) times daily.    Marland Kitchen PILOCARPINE HCL 4 % OP SOLN Both Eyes Place 1 drop into both eyes 2 (two) times daily.    Marland Kitchen PIOGLITAZONE HCL 15 MG PO TABS Oral Take 15 mg by mouth daily.    Marland Kitchen  POTASSIUM CHLORIDE CRYS ER 20 MEQ PO TBCR Oral Take 20 mEq by mouth 2 (two) times daily.    Marland Kitchen PREDNISONE 5 MG PO TABS Oral Take 5 mg by mouth daily.    . TORSEMIDE 20 MG PO TABS Oral Take 20 mg by mouth daily.    . TRAMADOL HCL 50 MG PO TABS Oral Take 50 mg by mouth every 6 (six) hours as needed. For pain    . TRIAMCINOLONE ACETONIDE 0.1 % EX CREA Topical Apply 1 application topically 2 (two) times daily.    Marland Kitchen ZOLPIDEM TARTRATE 5 MG PO TABS Oral Take 5 mg by mouth at bedtime as needed.      BP 126/53  Pulse 72  Temp(Src) 99.3 F (37.4 C) (Oral)  Resp 16  SpO2 100%  Physical Exam  Nursing note and vitals reviewed. Constitutional: She is oriented to person, place, and time. She appears well-developed and well-nourished. No distress.  Eyes: Conjunctivae are normal. No scleral icterus.   Neck: Neck supple. No tracheal deviation present.  Cardiovascular: Normal rate, regular rhythm, normal heart sounds and intact distal pulses.   Pulmonary/Chest: Effort normal and breath sounds normal. No respiratory distress.  Abdominal: Soft. Normal appearance and bowel sounds are normal. She exhibits no distension. There is no tenderness.  Musculoskeletal:       Pt with mild swelling right lower leg. Skin of anterior right lower leg is erythematous, tender, indurated w several dry/cracked areas. No active bleeding. Distal pulses dopplerable, strong signal. No calf tenderness.  Neurological: She is alert and oriented to person, place, and time.  Skin: Skin is warm and dry. No rash noted.  Psychiatric: She has a normal mood and affect.    ED Course  Procedures (including critical care time)    MDM  Vascular doppler.   Vascular tech reports that u/s negative for dvt.   Keflex po. Ultram po.   Discussed vascular doppler neg w pt. Discussed plan for abx and close pcp follow up.   Recheck pt comfortable. No distress.       Suzi Roots, MD 06/15/11 2008

## 2011-06-15 NOTE — ED Notes (Signed)
Discharge instructions reviewed with pt; verbalizes understanding.  No questions asked; no further c/o's voiced.  Pt to lobby to await ride home via wheelchair.

## 2011-06-15 NOTE — Discharge Instructions (Signed)
Elevate leg. Keep skin very clean. May use non scented, mild moisturizer such as eucerin or dove. Take antibiotic (keflex) as prescribed. Take ultram as need for pain -  No driving when taking.  Follow up with primary care doctor in the next 1-2 days for recheck.  Return to ER if worse, spreading redness, fevers, other concern.     Cellulitis Cellulitis is an infection of the skin and the tissue beneath it. The area is typically red and tender. It is caused by germs (bacteria) (usually staph or strep) that enter the body through cuts or sores. Cellulitis most commonly occurs in the arms or lower legs.  HOME CARE INSTRUCTIONS   If you are given a prescription for medications which kill germs (antibiotics), take as directed until finished.   If the infection is on the arm or leg, keep the limb elevated as able.   Use a warm cloth several times per day to relieve pain and encourage healing.   See your caregiver for recheck of the infected site as directed if problems arise.   Only take over-the-counter or prescription medicines for pain, discomfort, or fever as directed by your caregiver.  SEEK MEDICAL CARE IF:   The area of redness (inflammation) is spreading, there are red streaks coming from the infected site, or if a part of the infection begins to turn dark in color.   The joint or bone underneath the infected skin becomes painful after the skin has healed.   The infection returns in the same or another area after it seems to have gone away.   A boil or bump swells up. This may be an abscess.   New, unexplained problems such as pain or fever develop.  SEEK IMMEDIATE MEDICAL CARE IF:   You have a fever.   You or your child feels drowsy or lethargic.   There is vomiting, diarrhea, or lasting discomfort or feeling ill (malaise) with muscle aches and pains.  MAKE SURE YOU:   Understand these instructions.   Will watch your condition.   Will get help right away if you are not  doing well or get worse.  Document Released: 11/27/2004 Document Revised: 02/06/2011 Document Reviewed: 10/06/2007 [facility] Patient Information 2012 [place], [place].

## 2011-06-15 NOTE — ED Notes (Signed)
EMS-pt with right lower leg swelling x several days, pt was seen by PCP and given PCP for antibiotics, pt has not started taking yet. Pt states that today while drying off skin cracked and she noted bleeding to right lower extremity.

## 2011-06-16 ENCOUNTER — Other Ambulatory Visit: Payer: Self-pay | Admitting: Family Medicine

## 2011-06-16 DIAGNOSIS — R609 Edema, unspecified: Secondary | ICD-10-CM

## 2011-06-17 ENCOUNTER — Inpatient Hospital Stay: Admission: RE | Admit: 2011-06-17 | Payer: Medicare Other | Source: Ambulatory Visit

## 2011-07-08 ENCOUNTER — Ambulatory Visit (HOSPITAL_COMMUNITY)
Admission: RE | Admit: 2011-07-08 | Discharge: 2011-07-08 | Disposition: A | Discharge status: Home / Self Care | Payer: Medicare Other | Source: Ambulatory Visit | Attending: Cardiology | Admitting: Cardiology

## 2011-07-08 DIAGNOSIS — M7989 Other specified soft tissue disorders: Secondary | ICD-10-CM

## 2011-07-08 DIAGNOSIS — R21 Rash and other nonspecific skin eruption: Secondary | ICD-10-CM | POA: Insufficient documentation

## 2011-07-08 NOTE — Progress Notes (Addendum)
*  PRELIMINARY RESULTS* Vascular Ultrasound Right lower extremity venous duplex has been completed.  Preliminary findings: Right= No evidence of DVT or baker's cyst.  Left results with Dr. Sharyn Lull as Dr. Shana Chute is out of the office.  Farrel Demark, RDMS 07/08/2011, 11:48 AM

## 2011-07-10 ENCOUNTER — Ambulatory Visit (HOSPITAL_COMMUNITY)
Admission: RE | Admit: 2011-07-10 | Discharge: 2011-07-10 | Disposition: A | Discharge status: Home / Self Care | Payer: Medicare Other | Source: Ambulatory Visit | Attending: Cardiology | Admitting: Cardiology

## 2011-07-10 DIAGNOSIS — M79609 Pain in unspecified limb: Secondary | ICD-10-CM | POA: Insufficient documentation

## 2011-07-10 DIAGNOSIS — I739 Peripheral vascular disease, unspecified: Secondary | ICD-10-CM

## 2011-07-10 NOTE — Progress Notes (Signed)
VASCULAR LAB PRELIMINARY  PRELIMINARY  PRELIMINARY  PRELIMINARY  Bilateral lower extremity arterial duplex completed.    Preliminary report:  VASCULAR LAB PRELIMINARY  ARTERIAL  ABI completed:    RIGHT    LEFT    PRESSURE WAVEFORM  PRESSURE WAVEFORM  BRACHIAL 131 Triphasic BRACHIAL 119 Triphasic  DP 137 Biphasic DP 131 Triphasic  PT 104 Biphasic PT 149 Biphasic                         RIGHT LEFT  ABI 1.05 1.14   Duplex scan of the right lower extremity revealed normal triphasic wave forms throughout the thigh and  in the popliteal. Pedal Doppler waveforms appeared biphasic. Pressures were difficult to obtain in the right posterior tibial due to swelling. Results not consistent with claudication,  Mili Piltz D, 07/10/2011, 3:05 PM   Lunette Tapp D, 07/10/2011, 3:04 PM

## 2011-07-16 ENCOUNTER — Other Ambulatory Visit: Payer: Self-pay | Admitting: Cardiology

## 2011-07-16 DIAGNOSIS — Z1231 Encounter for screening mammogram for malignant neoplasm of breast: Secondary | ICD-10-CM

## 2011-08-15 ENCOUNTER — Ambulatory Visit
Admission: RE | Admit: 2011-08-15 | Discharge: 2011-08-15 | Disposition: A | Discharge status: Home / Self Care | Payer: Medicare Other | Source: Ambulatory Visit | Attending: Cardiology | Admitting: Cardiology

## 2011-08-15 DIAGNOSIS — Z1231 Encounter for screening mammogram for malignant neoplasm of breast: Secondary | ICD-10-CM

## 2011-08-26 ENCOUNTER — Other Ambulatory Visit: Payer: Self-pay | Admitting: Dermatology

## 2011-09-26 ENCOUNTER — Other Ambulatory Visit (HOSPITAL_COMMUNITY): Payer: Self-pay | Admitting: Cardiology

## 2011-09-26 DIAGNOSIS — R079 Chest pain, unspecified: Secondary | ICD-10-CM

## 2011-10-01 ENCOUNTER — Encounter (HOSPITAL_COMMUNITY)
Admission: RE | Admit: 2011-10-01 | Discharge: 2011-10-01 | Disposition: A | Discharge status: Home / Self Care | Payer: Medicare Other | Source: Ambulatory Visit | Attending: Cardiology | Admitting: Cardiology

## 2011-10-01 ENCOUNTER — Other Ambulatory Visit: Payer: Self-pay

## 2011-10-01 DIAGNOSIS — R079 Chest pain, unspecified: Secondary | ICD-10-CM

## 2011-10-01 MED ORDER — TECHNETIUM TC 99M TETROFOSMIN IV KIT
30.0000 | PACK | Freq: Once | INTRAVENOUS | Status: AC | PRN
  Administered 2011-10-01: 30 via INTRAVENOUS

## 2011-10-01 MED ORDER — TECHNETIUM TC 99M TETROFOSMIN IV KIT
10.0000 | PACK | Freq: Once | INTRAVENOUS | Status: AC | PRN
  Administered 2011-10-01: 10 via INTRAVENOUS

## 2011-10-01 MED ORDER — REGADENOSON 0.4 MG/5ML IV SOLN
0.4000 mg | Freq: Once | INTRAVENOUS | Status: AC
  Administered 2011-10-01: 0.4 mg via INTRAVENOUS

## 2011-10-01 MED ORDER — REGADENOSON 0.4 MG/5ML IV SOLN
INTRAVENOUS | Status: AC
  Administered 2011-10-01: 0.4 mg via INTRAVENOUS
  Filled 2011-10-01: qty 5

## 2012-01-06 ENCOUNTER — Encounter (INDEPENDENT_AMBULATORY_CARE_PROVIDER_SITE_OTHER): Payer: Self-pay | Admitting: General Surgery

## 2012-01-07 ENCOUNTER — Ambulatory Visit (INDEPENDENT_AMBULATORY_CARE_PROVIDER_SITE_OTHER): Payer: Medicare Other | Admitting: General Surgery

## 2012-01-07 ENCOUNTER — Other Ambulatory Visit (INDEPENDENT_AMBULATORY_CARE_PROVIDER_SITE_OTHER): Payer: Self-pay | Admitting: General Surgery

## 2012-01-07 ENCOUNTER — Encounter (INDEPENDENT_AMBULATORY_CARE_PROVIDER_SITE_OTHER): Payer: Self-pay | Admitting: General Surgery

## 2012-01-07 VITALS — BP 158/80 | HR 74 | Temp 98.1°F | Resp 18 | Ht 66.0 in | Wt 190.2 lb

## 2012-01-07 DIAGNOSIS — N6459 Other signs and symptoms in breast: Secondary | ICD-10-CM

## 2012-01-07 DIAGNOSIS — N6452 Nipple discharge: Secondary | ICD-10-CM

## 2012-01-07 NOTE — Patient Instructions (Signed)
The left nipple discharge that you have is abnormal. It probably is not cancer, but we need to do further studies and test to be sure because there is a small chance it could be a small cancer.  You will be scheduled for an ultrasound of the left breast, and if they see something that might biopsy.  Return to see Dr. Derrell Lolling in 2 weeks for further discussion about how this will be taking care of.

## 2012-01-07 NOTE — Progress Notes (Signed)
Patient ID: Katherine [name], female   DOB: 09-30-1930, 78 y.o.   MRN: [id number]  Chief Complaint  Patient presents with  . New Evaluation    Blood nipple discharge    HPI Katherine [name] is a 78 y.o. female.  She is referred by Dr. Donia Guiles for evaluation of a left breast nipple discharge.  This patient states that she has a 10-12 day history of left nipple discharge. She says it was bloody at first but now is more clear of previous continued on a daily basis and is spontaneous. She had some transient drainage of a lot right side, she says, but that has not persisted.  She had mammograms at the [facility] in June of this year which were routine screening mammograms and there was no focal abnormality, category 1.  She states she has had 2 breast biopsies of the right breast and 2 breast biopsies the left breast in the past, all of which were benign.  She has significant comorbidities including 2 strokes,  left carotid endarterectomy, diabetes mellitus, hypertension, anemia, chronic kidney disease, numerous abdominal operations. She is on antihypertensives, medications for glaucoma, insulin, proton pump inhibitors, and prednisone. HPI  Past Medical History  Diagnosis Date  . Bloody discharge from nipple   . Hypertension   . Stroke   . Anemia     mild  . Diabetes mellitus without complication     type 2  . Carotid artery stenosis     Past Surgical History  Procedure Date  . Eye surgery     right  . Cholecystectomy     History reviewed. No pertinent family history.  Social History History  Substance Use Topics  . Smoking status: Never Smoker   . Smokeless tobacco: Never Used  . Alcohol Use: No    Allergies  Allergen Reactions  . Aspirin Hives  . Blue Dyes (Parenteral) Hives  . Ibuprofen Hives  . Iohexol   . Shellfish-Derived Products Hives    Current Outpatient Prescriptions  Medication Sig Dispense Refill  . amLODipine-valsartan (EXFORGE) 5-160 MG per tablet Take 1  tablet by mouth daily.      Marland Kitchen atorvastatin (LIPITOR) 40 MG tablet Take 40 mg by mouth daily.      . bimatoprost (LUMIGAN) 0.01 % SOLN Place 1 drop into both eyes at bedtime.      . brimonidine (ALPHAGAN P) 0.1 % SOLN Place 1 drop into both eyes every 8 (eight) hours.       . clopidogrel (PLAVIX) 75 MG tablet Take 75 mg by mouth daily.      . colchicine 0.6 MG tablet Take 0.6 mg by mouth daily.      . dorzolamide (TRUSOPT) 2 % ophthalmic solution Place 1 drop into both eyes 2 (two) times daily.      . febuxostat (ULORIC) 40 MG tablet Take 40 mg by mouth 2 (two) times daily.      . insulin detemir (LEVEMIR) 100 UNIT/ML injection Inject 30 Units into the skin 3 (three) times daily.       Marland Kitchen loperamide (IMODIUM) 2 MG capsule Take 2 mg by mouth 4 (four) times daily as needed. For diarrhea      . LORazepam (ATIVAN) 0.5 MG tablet Take 0.5 mg by mouth every 8 (eight) hours.      Marland Kitchen omeprazole (PRILOSEC) 40 MG capsule Take 40 mg by mouth 2 (two) times daily.      . pilocarpine (PILOCAR) 4 % ophthalmic solution Place 1  drop into both eyes 2 (two) times daily.      . pioglitazone (ACTOS) 15 MG tablet Take 15 mg by mouth daily.      . potassium chloride SA (KLOR-CON M20) 20 MEQ tablet Take 20 mEq by mouth 2 (two) times daily.      . predniSONE (DELTASONE) 5 MG tablet Take 5 mg by mouth daily.      . ranitidine (ZANTAC) 150 MG tablet Take 150 mg by mouth 2 (two) times daily.      Marland Kitchen torsemide (DEMADEX) 20 MG tablet Take 20 mg by mouth daily.      . traMADol (ULTRAM) 50 MG tablet Take 50 mg by mouth every 6 (six) hours as needed. For pain      . triamcinolone cream (KENALOG) 0.1 % Apply 1 application topically 2 (two) times daily.      Marland Kitchen zolpidem (AMBIEN) 5 MG tablet Take 5 mg by mouth at bedtime as needed.        Review of Systems Review of Systems  Constitutional: Negative for fever, chills and unexpected weight change.  HENT: Negative for hearing loss, congestion, sore throat, trouble swallowing and  voice change.   Eyes: Positive for visual disturbance.  Respiratory: Negative for cough and wheezing.   Cardiovascular: Negative for chest pain, palpitations and leg swelling.  Gastrointestinal: Negative for nausea, vomiting, abdominal pain, diarrhea, constipation, blood in stool, abdominal distention and anal bleeding.  Genitourinary: Negative for hematuria, vaginal bleeding and difficulty urinating.  Musculoskeletal: Negative for arthralgias.  Skin: Negative for rash and wound.  Neurological: Positive for weakness. Negative for seizures, syncope and headaches.  Hematological: Negative for adenopathy. Does not bruise/bleed easily.  Psychiatric/Behavioral: Positive for decreased concentration. Negative for confusion.    Blood pressure 158/80, pulse 74, temperature 98.1 F (36.7 C), temperature source Temporal, resp. rate 18, height 5\' 6"  (1.676 m), weight 190 lb 4 oz (86.297 kg).  Physical Exam Physical Exam  Constitutional: She is oriented to person, place, and time. She appears well-developed and well-nourished. No distress.  HENT:  Head: Normocephalic and atraumatic.  Nose: Nose normal.  Mouth/Throat: No oropharyngeal exudate.       dentures  Eyes: Conjunctivae normal and EOM are normal. Pupils are equal, round, and reactive to light. Left eye exhibits no discharge. No scleral icterus.  Neck: Neck supple. No JVD present. No tracheal deviation present. No thyromegaly present.       Scar left neck, from carotid endarterectomy.  Cardiovascular: Normal rate, regular rhythm, normal heart sounds and intact distal pulses.   No murmur heard. Pulmonary/Chest: Effort normal and breath sounds normal. No respiratory distress. She has no wheezes. She has no rales. She exhibits no tenderness.         1 scar in the right breast, upper outer quadrant. 2 scars in the left breast laterally. No palpable mass in either breast. No axillary adenopathy on either side. There is a clear discharge from the  left nipple, centrally located within the nipple. This is reproducible. It is not appear bloody. There is no discharge from the right nipple.  Abdominal: Soft. Bowel sounds are normal. She exhibits no distension and no mass. There is no tenderness. There is no rebound and no guarding.       Multiple scars  Musculoskeletal: She exhibits edema. She exhibits no tenderness.  Lymphadenopathy:    She has no cervical adenopathy.  Neurological: She is alert and oriented to person, place, and time. She exhibits normal muscle tone. Coordination  normal.  Skin: Skin is warm. No rash noted. She is not diaphoretic. No erythema. No pallor.  Psychiatric: She has a normal mood and affect. Her behavior is normal. Judgment and thought content normal.    Data Reviewed Dr. Magda Kiel handwritten office notes. Recent mammograms.  Assessment    Spontaneous discharge left nipple, and initially reported to be bloody, now clear. Further evaluation required to rule out occult malignancy  History of multiple breast biopsies  History cerebrovascular accident   History left carotid endarterectomy  Insulin dependent diabetes mellitus  Hypertension  Chronic kidney disease  Anemia  History multiple Abdominal operations    Plan    Begin evaluation with left breast ultrasound. She will be referred.  image guided biopsy as indicated  Return to see me in 2 weeks for further decision making. She is aware that this may require an open biopsy.       Angelia Mould. Derrell Lolling, M.D., [facility] General and Minimally invasive Surgery Breast and Colorectal Surgery Office:   [phone] Pager:   [phone]  01/07/2012, 3:19 PM

## 2012-01-08 ENCOUNTER — Other Ambulatory Visit (INDEPENDENT_AMBULATORY_CARE_PROVIDER_SITE_OTHER): Payer: Self-pay | Admitting: General Surgery

## 2012-01-08 ENCOUNTER — Ambulatory Visit
Admission: RE | Admit: 2012-01-08 | Discharge: 2012-01-08 | Disposition: A | Discharge status: Home / Self Care | Payer: Medicare Other | Source: Ambulatory Visit | Attending: General Surgery | Admitting: General Surgery

## 2012-01-08 DIAGNOSIS — N6459 Other signs and symptoms in breast: Secondary | ICD-10-CM

## 2012-01-15 ENCOUNTER — Ambulatory Visit (INDEPENDENT_AMBULATORY_CARE_PROVIDER_SITE_OTHER): Payer: Medicare Other | Admitting: General Surgery

## 2012-01-15 ENCOUNTER — Encounter (INDEPENDENT_AMBULATORY_CARE_PROVIDER_SITE_OTHER): Payer: Self-pay | Admitting: General Surgery

## 2012-01-15 VITALS — BP 142/86 | HR 72 | Temp 97.4°F | Resp 16 | Ht 68.0 in | Wt 187.4 lb

## 2012-01-15 DIAGNOSIS — N6452 Nipple discharge: Secondary | ICD-10-CM

## 2012-01-15 DIAGNOSIS — N6459 Other signs and symptoms in breast: Secondary | ICD-10-CM

## 2012-01-15 NOTE — Progress Notes (Signed)
Patient ID: Katherine [name], female   DOB: [birth year], 78 y.o.   MRN: [id number]  Chief Complaint  Patient presents with  . Follow-up    breat    HPI Katherine [name] is a 78 y.o. female.  She returns for further evaluation and management plans of her bloody left nipple discharge.  Imaging studies including mammogram and ultrasound show a masslike filling defect focally in the left breast at the 2:00 position, 2 cm from the nipple measuring 1.3 cm in greatest dimension. Radiology suggested the differential diagnosis includes papilloma, papillary cancer, DCIS, or debris. It was too superficial for percutaneous biopsy.  The patient returns for further decisions and scheduling of left breast biopsy.  Comorbidities include severe vascular accident x 2, on Plavix, left carotid endarterectomy, insulin-dependent diabetes mellitus, hypertension, chronic anemia, CK D., multiple double operations. HPI  Past Medical History  Diagnosis Date  . Bloody discharge from nipple   . Hypertension   . Stroke   . Anemia     mild  . Diabetes mellitus without complication     type 2  . Carotid artery stenosis     Past Surgical History  Procedure Date  . Eye surgery     right  . Cholecystectomy     No family history on file.  Social History History  Substance Use Topics  . Smoking status: Never Smoker   . Smokeless tobacco: Never Used  . Alcohol Use: No    Allergies  Allergen Reactions  . Aspirin Hives  . Blue Dyes (Parenteral) Hives  . Ibuprofen Hives  . Iohexol   . Shellfish-Derived Products Hives    Current Outpatient Prescriptions  Medication Sig Dispense Refill  . amLODipine-valsartan (EXFORGE) 5-160 MG per tablet Take 1 tablet by mouth daily.      Marland Kitchen atorvastatin (LIPITOR) 40 MG tablet Take 40 mg by mouth daily.      . bimatoprost (LUMIGAN) 0.01 % SOLN Place 1 drop into both eyes at bedtime.      . brimonidine (ALPHAGAN P) 0.1 % SOLN Place 1 drop into both eyes every 8 (eight)  hours.       . clopidogrel (PLAVIX) 75 MG tablet Take 75 mg by mouth daily.      . colchicine 0.6 MG tablet Take 0.6 mg by mouth daily.      . dorzolamide (TRUSOPT) 2 % ophthalmic solution Place 1 drop into both eyes 2 (two) times daily.      . febuxostat (ULORIC) 40 MG tablet Take 40 mg by mouth 2 (two) times daily.      . insulin detemir (LEVEMIR) 100 UNIT/ML injection Inject 30 Units into the skin 3 (three) times daily.       Marland Kitchen loperamide (IMODIUM) 2 MG capsule Take 2 mg by mouth 4 (four) times daily as needed. For diarrhea      . LORazepam (ATIVAN) 0.5 MG tablet Take 0.5 mg by mouth every 8 (eight) hours.      Marland Kitchen omeprazole (PRILOSEC) 40 MG capsule Take 40 mg by mouth 2 (two) times daily.      . pilocarpine (PILOCAR) 4 % ophthalmic solution Place 1 drop into both eyes 2 (two) times daily.      . pioglitazone (ACTOS) 15 MG tablet Take 15 mg by mouth daily.      . potassium chloride SA (KLOR-CON M20) 20 MEQ tablet Take 20 mEq by mouth 2 (two) times daily.      . predniSONE (DELTASONE) 5 MG tablet  Take 5 mg by mouth daily.      . ranitidine (ZANTAC) 150 MG tablet Take 150 mg by mouth 2 (two) times daily.      Marland Kitchen torsemide (DEMADEX) 20 MG tablet Take 20 mg by mouth daily.      . traMADol (ULTRAM) 50 MG tablet Take 50 mg by mouth every 6 (six) hours as needed. For pain      . triamcinolone cream (KENALOG) 0.1 % Apply 1 application topically 2 (two) times daily.      Marland Kitchen zolpidem (AMBIEN) 5 MG tablet Take 5 mg by mouth at bedtime as needed.        Review of Systems Review of Systems  Constitutional: Negative for fever, chills and unexpected weight change.  HENT: Negative for hearing loss, congestion, sore throat, trouble swallowing and voice change.   Eyes: Negative for visual disturbance.  Respiratory: Negative for cough and wheezing.   Cardiovascular: Negative for chest pain, palpitations and leg swelling.  Gastrointestinal: Negative for nausea, vomiting, abdominal pain, diarrhea,  constipation, blood in stool, abdominal distention and anal bleeding.  Genitourinary: Negative for hematuria, vaginal bleeding and difficulty urinating.  Musculoskeletal: Negative for arthralgias.  Skin: Negative for rash and wound.  Neurological: Negative for seizures, syncope and headaches.  Hematological: Negative for adenopathy. Does not bruise/bleed easily.  Psychiatric/Behavioral: Negative for confusion.    Blood pressure 142/86, pulse 72, temperature 97.4 F (36.3 C), resp. rate 16, height 5\' 8"  (1.727 m), weight 187 lb 6.4 oz (85.004 kg).  Physical Exam Physical Exam  Constitutional: She is oriented to person, place, and time. She appears well-developed and well-nourished. No distress.  HENT:  Head: Normocephalic and atraumatic.  Nose: Nose normal.  Mouth/Throat: No oropharyngeal exudate.       dentures  Eyes: Conjunctivae normal and EOM are normal. Pupils are equal, round, and reactive to light. Left eye exhibits no discharge. No scleral icterus.  Neck: Neck supple. No JVD present. No tracheal deviation present. No thyromegaly present.       Scar left neck from carotid endarterectomy  Cardiovascular: Normal rate, regular rhythm, normal heart sounds and intact distal pulses.   No murmur heard. Pulmonary/Chest: Effort normal and breath sounds normal. No respiratory distress. She has no wheezes. She has no rales. She exhibits no tenderness.       Bilateral breast scars from prior biopsy. No adenopathy. No mass. Clear discharge from left nipple, central duct.  Abdominal: Soft. Bowel sounds are normal. She exhibits no distension and no mass. There is no tenderness. There is no rebound and no guarding.       Multiple scars  Musculoskeletal: She exhibits no edema and no tenderness.  Lymphadenopathy:    She has no cervical adenopathy.  Neurological: She is alert and oriented to person, place, and time. She exhibits normal muscle tone. Coordination normal.  Skin: Skin is warm. No  rash noted. She is not diaphoretic. No erythema. No pallor.  Psychiatric: She has a normal mood and affect. Her behavior is normal. Judgment and thought content normal.    Data Reviewed Mammogram and ultrasound  Assessment    Spontaneous discharge left nipple, initially reported to be bloody, now clear. Persistent. Imaging studies suggest ductal filling defect. Differential diagnosis includes papilloma, papillary cancer, DCIS, or debris within the duct  History of multiple breast biopsies  History cerebrovascular accident  History left carotid endarterectomy on Plavix  Insulin-dependent diabetes mellitus  Hypertension  Chronic kidney disease  Anemia  History multiple bowel operations  Plan    Scheduled for excision left breast ductal system, upper outer quadrant,Circumareolar incision  I discussed the indications, details, techniques and numerous risks of the surgery. She understands that if this is cancer she may need a second operation, but hopefully not. Her questions were answered. She understands these issues. She agrees with this plan.       Angelia Mould. Derrell Lolling, M.D., [facility] General and Minimally invasive Surgery Breast and Colorectal Surgery Office:   [phone] Pager:   [phone]  01/15/2012, 11:52 AM

## 2012-01-15 NOTE — Patient Instructions (Signed)
Your x-rays suggest a mass in the left breast in the upper outer quadrant near the nipple. The radiology doctors state that this is too close to the skin for them to do a biopsy.  There is a small chance that this is a cancer, but most likely it is benign.  You will be scheduled for surgery to remove this area of your breast to stop the drainage and to clarify the diagnosis.  You will need to stop the Plavix 5 days prior to the surgery. We will call Dr. Shana Chute to be sure that this is okay.    Lumpectomy, Breast Conserving Surgery A lumpectomy is breast surgery that removes only part of the breast. Another name used may be partial mastectomy. The amount removed varies. Make sure you understand how much of your breast will be removed. Reasons for a lumpectomy:  Any solid breast mass.  Grouped significant nodularity that may be confused with a solitary breast mass. Lumpectomy is the most common form of breast cancer surgery today. The surgeon removes the portion of your breast which contains the tumor (cancer). This is the lump. Some normal tissue around the lump is also removed to be sure that all the tumor has been removed.  If cancer cells are found in the margins where the breast tissue was removed, your surgeon will do more surgery to remove the remaining cancer tissue. This is called re-excision surgery. Radiation and/or chemotherapy treatments are often given following a lumpectomy to kill any cancer cells that could possibly remain.  REASONS YOU MAY NOT BE ABLE TO HAVE BREAST CONSERVING SURGERY:  The tumor is located in more than one place.  Your breast is small and the tumor is large so the breast would be disfigured.  The entire tumor removal is not successful with a lumpectomy.  You cannot commit to a full course of chemotherapy, radiation therapy or are pregnant and cannot have radiation.  You have previously had radiation to the breast to treat cancer. HOW A LUMPECTOMY IS  PERFORMED If overnight nursing is not required following a biopsy, a lumpectomy can be performed as a same-day surgery. This can be done in a hospital, clinic, or surgical center. The anesthesia used will depend on your surgeon. They will discuss this with you. A general anesthetic keeps you sleeping through the procedure. LET YOUR CAREGIVERS KNOW ABOUT THE FOLLOWING:  Allergies  Medications taken including herbs, eye drops, over the counter medications, and creams.  Use of steroids (by mouth or creams)  Previous problems with anesthetics or Novocaine.  Possibility of pregnancy, if this applies  History of blood clots (thrombophlebitis)  History of bleeding or blood problems.  Previous surgery  Other health problems BEFORE THE PROCEDURE You should be present one hour prior to your procedure unless directed otherwise.  AFTER THE PROCEDURE  After surgery, you will be taken to the recovery area where a nurse will watch and check your progress. Once you're awake, stable, and taking fluids well, barring other problems you will be allowed to go home.  Ice packs applied to your operative site may help with discomfort and keep the swelling down.  A small rubber drain may be placed in the breast for a couple of days to prevent a hematoma from developing in the breast.  A pressure dressing may be applied for 24 to 48 hours to prevent bleeding.  Keep the wound dry.  You may resume a normal diet and activities as directed. Avoid strenuous activities affecting  the arm on the side of the biopsy site such as tennis, swimming, heavy lifting (more than 10 pounds) or pulling.  Bruising in the breast is normal following this procedure.  Wearing a bra - even to bed - may be more comfortable and also help keep the dressing on.  Change dressings as directed.  Only take over-the-counter or prescription medicines for pain, discomfort, or fever as directed by your caregiver. Call for your results  as instructed by your surgeon. Remember it is your responsibility to get the results of your lumpectomy if your surgeon asked you to follow-up. Do not assume everything is fine if you have not heard from your caregiver. SEEK MEDICAL CARE IF:   There is increased bleeding (more than a small spot) from the wound.  You notice redness, swelling, or increasing pain in the wound.  Pus is coming from wound.  An unexplained oral temperature above 102 F (38.9 C) develops.  You notice a foul smell coming from the wound or dressing. SEEK IMMEDIATE MEDICAL CARE IF:   You develop a rash.  You have difficulty breathing.  You have any allergic problems. Document Released: 03/31/2006 Document Revised: 05/12/2011 Document Reviewed: 07/02/2006 [facility] Patient Information 2013 [place], [place].

## 2012-01-19 ENCOUNTER — Telehealth (INDEPENDENT_AMBULATORY_CARE_PROVIDER_SITE_OTHER): Payer: Self-pay | Admitting: General Surgery

## 2012-01-19 NOTE — Telephone Encounter (Signed)
Called Dr. Magda Kiel office and requested he contact us regarding approval for this patient stopping Plavix 5 days prior to surgery that has been scheduled for 03/02/12. Advised of contact number and fax for response. Advised Dr. Shana Chute can staff message reply if he wishes.

## 2012-02-16 ENCOUNTER — Encounter (INDEPENDENT_AMBULATORY_CARE_PROVIDER_SITE_OTHER): Payer: Self-pay

## 2012-02-26 ENCOUNTER — Encounter (HOSPITAL_BASED_OUTPATIENT_CLINIC_OR_DEPARTMENT_OTHER): Payer: Self-pay | Admitting: *Deleted

## 2012-02-26 NOTE — H&P (Signed)
Katherine [name]     MRN: [id number]   Description: 78 year old female  Provider: Ernestene Mention, MD  Department: [facility]       Diagnoses     Nipple discharge, bloody   - Primary    611.79        Vitals   BP Pulse Temp Resp Ht Wt    142/86 72 97.4 F (36.3 C) 16 5\' 8"  (1.727 m) 187 lb 6.4 oz (85.004 kg)   BMI - 28.49 kg/m2                 History and Phjysical     Ernestene Mention, MD   Patient ID: Katherine [name], female   DOB: 18-Jun-1930, 78 y.o.   MRN: [id number]             HPI Katherine [name] is a 78 y.o. female.  She returns for further evaluation and management plans of her bloody left nipple discharge.   Imaging studies including mammogram and ultrasound show a masslike filling defect focally in the left breast at the 2:00 position, 2 cm from the nipple measuring 1.3 cm in greatest dimension. Radiology suggested the differential diagnosis includes papilloma, papillary cancer, DCIS, or debris. It was too superficial for percutaneous biopsy.   The patient returns for further decisions and scheduling of left breast biopsy.   Comorbidities include severe vascular accident x 2, on Plavix, left carotid endarterectomy, insulin-dependent diabetes mellitus, hypertension, chronic anemia, CK D., multiple double operations.      Past Medical History   Diagnosis  Date   .  Bloody discharge from nipple     .  Hypertension     .  Stroke     .  Anemia         mild   .  Diabetes mellitus without complication         type 2   .  Carotid artery stenosis         Past Surgical History   Procedure  Date   .  Eye surgery         right   .  Cholecystectomy        No family history on file.   Social History History   Substance Use Topics   .  Smoking status:  Never Smoker    .  Smokeless tobacco:  Never Used   .  Alcohol Use:  No       Allergies   Allergen  Reactions   .  Aspirin  Hives   .  Blue Dyes (Parenteral)  Hives   .  Ibuprofen  Hives    .  Iohexol     .  Shellfish-Derived Products  Hives       Current Outpatient Prescriptions   Medication  Sig  Dispense  Refill   .  amLODipine-valsartan (EXFORGE) 5-160 MG per tablet  Take 1 tablet by mouth daily.         Marland Kitchen  atorvastatin (LIPITOR) 40 MG tablet  Take 40 mg by mouth daily.         .  bimatoprost (LUMIGAN) 0.01 % SOLN  Place 1 drop into both eyes at bedtime.         .  brimonidine (ALPHAGAN P) 0.1 % SOLN  Place 1 drop into both eyes every 8 (eight) hours.          .  clopidogrel (PLAVIX) 75 MG tablet  Take 75 mg by mouth daily.         .  colchicine 0.6 MG tablet  Take 0.6 mg by mouth daily.         .  dorzolamide (TRUSOPT) 2 % ophthalmic solution  Place 1 drop into both eyes 2 (two) times daily.         .  febuxostat (ULORIC) 40 MG tablet  Take 40 mg by mouth 2 (two) times daily.         .  insulin detemir (LEVEMIR) 100 UNIT/ML injection  Inject 30 Units into the skin 3 (three) times daily.          Marland Kitchen  loperamide (IMODIUM) 2 MG capsule  Take 2 mg by mouth 4 (four) times daily as needed. For diarrhea         .  LORazepam (ATIVAN) 0.5 MG tablet  Take 0.5 mg by mouth every 8 (eight) hours.         Marland Kitchen  omeprazole (PRILOSEC) 40 MG capsule  Take 40 mg by mouth 2 (two) times daily.         .  pilocarpine (PILOCAR) 4 % ophthalmic solution  Place 1 drop into both eyes 2 (two) times daily.         .  pioglitazone (ACTOS) 15 MG tablet  Take 15 mg by mouth daily.         .  potassium chloride SA (KLOR-CON M20) 20 MEQ tablet  Take 20 mEq by mouth 2 (two) times daily.         .  predniSONE (DELTASONE) 5 MG tablet  Take 5 mg by mouth daily.         .  ranitidine (ZANTAC) 150 MG tablet  Take 150 mg by mouth 2 (two) times daily.         Marland Kitchen  torsemide (DEMADEX) 20 MG tablet  Take 20 mg by mouth daily.         .  traMADol (ULTRAM) 50 MG tablet  Take 50 mg by mouth every 6 (six) hours as needed. For pain         .  triamcinolone cream (KENALOG) 0.1 %  Apply 1 application topically 2 (two) times  daily.         Marland Kitchen  zolpidem (AMBIEN) 5 MG tablet  Take 5 mg by mouth at bedtime as needed.            Review of Systems   Constitutional: Negative for fever, chills and unexpected weight change.  HENT: Negative for hearing loss, congestion, sore throat, trouble swallowing and voice change.   Eyes: Negative for visual disturbance.  Respiratory: Negative for cough and wheezing.   Cardiovascular: Negative for chest pain, palpitations and leg swelling.  Gastrointestinal: Negative for nausea, vomiting, abdominal pain, diarrhea, constipation, blood in stool, abdominal distention and anal bleeding.  Genitourinary: Negative for hematuria, vaginal bleeding and difficulty urinating.  Musculoskeletal: Negative for arthralgias.  Skin: Negative for rash and wound.  Neurological: Negative for seizures, syncope and headaches.  Hematological: Negative for adenopathy. Does not bruise/bleed easily.  Psychiatric/Behavioral: Negative for confusion.    Blood pressure 142/86, pulse 72, temperature 97.4 F (36.3 C), resp. rate 16, height 5\' 8"  (1.727 m), weight 187 lb 6.4 oz (85.004 kg).   Physical Exam   Constitutional: She is oriented to person, place, and time. She appears well-developed and well-nourished. No distress.  HENT:   Head: Normocephalic and atraumatic.   Nose: Nose normal.  Mouth/Throat: No oropharyngeal exudate.       dentures  Eyes: Conjunctivae normal and EOM are normal. Pupils are equal, round, and reactive to light. Left eye exhibits no discharge. No scleral icterus.  Neck: Neck supple. No JVD present. No tracheal deviation present. No thyromegaly present.       Scar left neck from carotid endarterectomy  Cardiovascular: Normal rate, regular rhythm, normal heart sounds and intact distal pulses.    No murmur heard. Pulmonary/Chest: Effort normal and breath sounds normal. No respiratory distress. She has no wheezes. She has no rales. She exhibits no tenderness.       Bilateral  breast scars from prior biopsy. No adenopathy. No mass. Clear discharge from left nipple, central duct.  Abdominal: Soft. Bowel sounds are normal. She exhibits no distension and no mass. There is no tenderness. There is no rebound and no guarding.       Multiple scars  Musculoskeletal: She exhibits no edema and no tenderness.  Lymphadenopathy:    She has no cervical adenopathy.  Neurological: She is alert and oriented to person, place, and time. She exhibits normal muscle tone. Coordination normal.  Skin: Skin is warm. No rash noted. She is not diaphoretic. No erythema. No pallor.  Psychiatric: She has a normal mood and affect. Her behavior is normal. Judgment and thought content normal.    Data Reviewed Mammogram and ultrasound   Assessment Spontaneous discharge left nipple, initially reported to be bloody, now clear. Persistent. Imaging studies suggest ductal filling defect. Differential diagnosis includes papilloma, papillary cancer, DCIS, or debris within the duct   History of multiple breast biopsies   History cerebrovascular accident   History left carotid endarterectomy on Plavix   Insulin-dependent diabetes mellitus   Hypertension   Chronic kidney disease   Anemia   History multiple bowel operations   Plan Scheduled for excision left breast ductal system, upper outer quadrant,Circumareolar incision   I discussed the indications, details, techniques and numerous risks of the surgery. She understands that if this is cancer she may need a second operation, but hopefully not. Her questions were answered. She understands these issues. She agrees with this plan.       Angelia Mould. Derrell Lolling, M.D., [facility] General and Minimally invasive Surgery Breast and Colorectal Surgery Office:   [phone] Pager:   [phone]

## 2012-02-26 NOTE — Progress Notes (Signed)
Talked with dr Sharyn Lull office to see if we can do her labwork 12/30-she is seeing him for surgery clearance. Office to let me know asap

## 2012-02-26 NOTE — Progress Notes (Signed)
Pt to see dr Sharyn Lull 12/30-will ck on labwork Had stress test 7/13-nl-ekg done-has stopped plavix per dr Derrell Lolling

## 2012-03-01 NOTE — Anesthesia Preprocedure Evaluation (Addendum)
Anesthesia Evaluation  Patient identified by MRN, date of birth, ID band Patient awake    Reviewed: Allergy & Precautions, H&P , NPO status , Patient's Chart, lab work & pertinent test results  Airway Mallampati: I TM Distance: >3 FB Neck ROM: Full    Dental   Pulmonary  breath sounds clear to auscultation        Cardiovascular hypertension, + Peripheral Vascular Disease Rhythm:Regular Rate:Normal     Neuro/Psych CVA    GI/Hepatic   Endo/Other  diabetes  Renal/GU      Musculoskeletal   Abdominal   Peds  Hematology   Anesthesia Other Findings   Reproductive/Obstetrics                          Anesthesia Physical Anesthesia Plan  ASA: III  Anesthesia Plan: General   Post-op Pain Management:    Induction: Intravenous  Airway Management Planned: LMA  Additional Equipment:   Intra-op Plan:   Post-operative Plan: Extubation in OR  Informed Consent: I have reviewed the patients History and Physical, chart, labs and discussed the procedure including the risks, benefits and alternatives for the proposed anesthesia with the patient or authorized representative who has indicated his/her understanding and acceptance.     Plan Discussed with: CRNA and Surgeon  Anesthesia Plan Comments:         Anesthesia Quick Evaluation

## 2012-03-02 ENCOUNTER — Ambulatory Visit (HOSPITAL_BASED_OUTPATIENT_CLINIC_OR_DEPARTMENT_OTHER)
Admission: RE | Admit: 2012-03-02 | Discharge: 2012-03-02 | Disposition: A | Discharge status: Home / Self Care | Payer: Medicare Other | Source: Ambulatory Visit | Attending: General Surgery | Admitting: General Surgery

## 2012-03-02 ENCOUNTER — Encounter (HOSPITAL_BASED_OUTPATIENT_CLINIC_OR_DEPARTMENT_OTHER): Payer: Self-pay

## 2012-03-02 ENCOUNTER — Encounter (HOSPITAL_BASED_OUTPATIENT_CLINIC_OR_DEPARTMENT_OTHER)
Admission: RE | Disposition: A | Discharge status: Home / Self Care | Payer: Self-pay | Source: Ambulatory Visit | Attending: General Surgery

## 2012-03-02 ENCOUNTER — Ambulatory Visit (HOSPITAL_BASED_OUTPATIENT_CLINIC_OR_DEPARTMENT_OTHER): Payer: Medicare Other | Admitting: Anesthesiology

## 2012-03-02 ENCOUNTER — Encounter (HOSPITAL_BASED_OUTPATIENT_CLINIC_OR_DEPARTMENT_OTHER): Payer: Self-pay | Admitting: Anesthesiology

## 2012-03-02 DIAGNOSIS — Z888 Allergy status to other drugs, medicaments and biological substances status: Secondary | ICD-10-CM | POA: Insufficient documentation

## 2012-03-02 DIAGNOSIS — D649 Anemia, unspecified: Secondary | ICD-10-CM | POA: Insufficient documentation

## 2012-03-02 DIAGNOSIS — Z8673 Personal history of transient ischemic attack (TIA), and cerebral infarction without residual deficits: Secondary | ICD-10-CM | POA: Insufficient documentation

## 2012-03-02 DIAGNOSIS — Z794 Long term (current) use of insulin: Secondary | ICD-10-CM | POA: Insufficient documentation

## 2012-03-02 DIAGNOSIS — Z7902 Long term (current) use of antithrombotics/antiplatelets: Secondary | ICD-10-CM | POA: Insufficient documentation

## 2012-03-02 DIAGNOSIS — N6452 Nipple discharge: Secondary | ICD-10-CM

## 2012-03-02 DIAGNOSIS — N189 Chronic kidney disease, unspecified: Secondary | ICD-10-CM | POA: Insufficient documentation

## 2012-03-02 DIAGNOSIS — I129 Hypertensive chronic kidney disease with stage 1 through stage 4 chronic kidney disease, or unspecified chronic kidney disease: Secondary | ICD-10-CM | POA: Insufficient documentation

## 2012-03-02 DIAGNOSIS — D249 Benign neoplasm of unspecified breast: Secondary | ICD-10-CM

## 2012-03-02 DIAGNOSIS — E119 Type 2 diabetes mellitus without complications: Secondary | ICD-10-CM | POA: Insufficient documentation

## 2012-03-02 DIAGNOSIS — Z886 Allergy status to analgesic agent status: Secondary | ICD-10-CM | POA: Insufficient documentation

## 2012-03-02 DIAGNOSIS — Z9089 Acquired absence of other organs: Secondary | ICD-10-CM | POA: Insufficient documentation

## 2012-03-02 DIAGNOSIS — Z91013 Allergy to seafood: Secondary | ICD-10-CM | POA: Insufficient documentation

## 2012-03-02 HISTORY — DX: Presence of spectacles and contact lenses: Z97.3

## 2012-03-02 HISTORY — DX: Complete loss of teeth, unspecified cause, unspecified class: Z97.2

## 2012-03-02 HISTORY — PX: BREAST DUCTAL SYSTEM EXCISION: SHX5242

## 2012-03-02 HISTORY — DX: Presence of dental prosthetic device (complete) (partial): K08.109

## 2012-03-02 LAB — POCT I-STAT, CHEM 8
BUN: 24 mg/dL — ABNORMAL HIGH (ref 6–23)
Calcium, Ion: 1.29 mmol/L (ref 1.13–1.30)
Chloride: 108 mEq/L (ref 96–112)
Glucose, Bld: 199 mg/dL — ABNORMAL HIGH (ref 70–99)
HCT: 35 % — ABNORMAL LOW (ref 36.0–46.0)
Potassium: 3.7 mEq/L (ref 3.5–5.1)

## 2012-03-02 SURGERY — EXCISION DUCTAL SYSTEM BREAST
Anesthesia: General | Site: Breast | Laterality: Left | Wound class: Clean

## 2012-03-02 MED ORDER — OXYCODONE HCL 5 MG/5ML PO SOLN: 5.0000 mg | Freq: Once | ORAL | Status: DC | PRN

## 2012-03-02 MED ORDER — FENTANYL CITRATE 0.05 MG/ML IJ SOLN
INTRAMUSCULAR | Status: DC | PRN
  Administered 2012-03-02: 50 ug via INTRAVENOUS

## 2012-03-02 MED ORDER — BUPIVACAINE-EPINEPHRINE 0.5% -1:200000 IJ SOLN
INTRAMUSCULAR | Status: DC | PRN
  Administered 2012-03-02: 20 mL

## 2012-03-02 MED ORDER — MIDAZOLAM HCL 2 MG/2ML IJ SOLN: 1.0000 mg | INTRAMUSCULAR | Status: DC | PRN

## 2012-03-02 MED ORDER — SODIUM CHLORIDE 0.9 % IV SOLN: INTRAVENOUS | Status: DC

## 2012-03-02 MED ORDER — SODIUM CHLORIDE 0.9 % IJ SOLN: 3.0000 mL | INTRAMUSCULAR | Status: DC | PRN

## 2012-03-02 MED ORDER — FENTANYL CITRATE 0.05 MG/ML IJ SOLN: 25.0000 ug | INTRAMUSCULAR | Status: DC | PRN

## 2012-03-02 MED ORDER — EPHEDRINE SULFATE 50 MG/ML IJ SOLN
INTRAMUSCULAR | Status: DC | PRN
  Administered 2012-03-02: 10 mg via INTRAVENOUS

## 2012-03-02 MED ORDER — LACTATED RINGERS IV SOLN
INTRAVENOUS | Status: DC
  Administered 2012-03-02 (×2): via INTRAVENOUS

## 2012-03-02 MED ORDER — ACETAMINOPHEN 650 MG RE SUPP: 650.0000 mg | RECTAL | Status: DC | PRN

## 2012-03-02 MED ORDER — PROPOFOL 10 MG/ML IV BOLUS
INTRAVENOUS | Status: DC | PRN
  Administered 2012-03-02: 130 mg via INTRAVENOUS

## 2012-03-02 MED ORDER — CEFAZOLIN SODIUM-DEXTROSE 2-3 GM-% IV SOLR
2.0000 g | INTRAVENOUS | Status: AC
  Administered 2012-03-02: 2 g via INTRAVENOUS

## 2012-03-02 MED ORDER — OXYCODONE HCL 5 MG PO TABS: 5.0000 mg | ORAL_TABLET | ORAL | Status: DC | PRN

## 2012-03-02 MED ORDER — ONDANSETRON HCL 4 MG/2ML IJ SOLN: 4.0000 mg | Freq: Four times a day (QID) | INTRAMUSCULAR | Status: DC | PRN

## 2012-03-02 MED ORDER — ONDANSETRON HCL 4 MG/2ML IJ SOLN
INTRAMUSCULAR | Status: DC | PRN
  Administered 2012-03-02: 4 mg via INTRAVENOUS

## 2012-03-02 MED ORDER — ACETAMINOPHEN 325 MG PO TABS: 650.0000 mg | ORAL_TABLET | ORAL | Status: DC | PRN

## 2012-03-02 MED ORDER — LIDOCAINE HCL (CARDIAC) 20 MG/ML IV SOLN
INTRAVENOUS | Status: DC | PRN
  Administered 2012-03-02: 75 mg via INTRAVENOUS

## 2012-03-02 MED ORDER — HYDROCODONE-ACETAMINOPHEN 5-325 MG PO TABS: 1.0000 | ORAL_TABLET | ORAL | Status: DC | PRN

## 2012-03-02 MED ORDER — CHLORHEXIDINE GLUCONATE 4 % EX LIQD: 1.0000 "application " | Freq: Once | CUTANEOUS | Status: DC

## 2012-03-02 MED ORDER — OXYCODONE HCL 5 MG PO TABS: 5.0000 mg | ORAL_TABLET | Freq: Once | ORAL | Status: DC | PRN

## 2012-03-02 MED ORDER — HEPARIN SODIUM (PORCINE) 5000 UNIT/ML IJ SOLN
5000.0000 [IU] | Freq: Once | INTRAMUSCULAR | Status: AC
  Administered 2012-03-02: 5000 [IU] via SUBCUTANEOUS

## 2012-03-02 MED ORDER — MORPHINE SULFATE 2 MG/ML IJ SOLN: 2.0000 mg | INTRAMUSCULAR | Status: DC | PRN

## 2012-03-02 MED ORDER — SODIUM CHLORIDE 0.9 % IV SOLN: 250.0000 mL | INTRAVENOUS | Status: DC | PRN

## 2012-03-02 MED ORDER — SODIUM CHLORIDE 0.9 % IJ SOLN: 3.0000 mL | Freq: Two times a day (BID) | INTRAMUSCULAR | Status: DC

## 2012-03-02 MED ORDER — FENTANYL CITRATE 0.05 MG/ML IJ SOLN: 50.0000 ug | Freq: Once | INTRAMUSCULAR | Status: DC

## 2012-03-02 MED ORDER — PROMETHAZINE HCL 25 MG/ML IJ SOLN: 6.2500 mg | INTRAMUSCULAR | Status: DC | PRN

## 2012-03-02 SURGICAL SUPPLY — 61 items
ADH SKN CLS APL DERMABOND .7 (GAUZE/BANDAGES/DRESSINGS) ×1
APL SKNCLS STERI-STRIP NONHPOA (GAUZE/BANDAGES/DRESSINGS)
APPLIER CLIP 9.375 MED OPEN (MISCELLANEOUS) ×2
APR CLP MED 9.3 20 MLT OPN (MISCELLANEOUS) ×1
BANDAGE ELASTIC 6 VELCRO ST LF (GAUZE/BANDAGES/DRESSINGS) IMPLANT
BENZOIN TINCTURE PRP APPL 2/3 (GAUZE/BANDAGES/DRESSINGS) IMPLANT
BLADE HEX COATED 2.75 (ELECTRODE) ×2 IMPLANT
BLADE SURG 15 STRL LF DISP TIS (BLADE) ×2 IMPLANT
BLADE SURG 15 STRL SS (BLADE) ×4
CANISTER SUCTION 1200CC (MISCELLANEOUS) ×2 IMPLANT
CHLORAPREP W/TINT 26ML (MISCELLANEOUS) ×2 IMPLANT
CLIP APPLIE 9.375 MED OPEN (MISCELLANEOUS) ×1 IMPLANT
CLOTH BEACON ORANGE TIMEOUT ST (SAFETY) ×2 IMPLANT
COVER MAYO STAND STRL (DRAPES) ×2 IMPLANT
COVER TABLE BACK 60X90 (DRAPES) ×2 IMPLANT
DECANTER SPIKE VIAL GLASS SM (MISCELLANEOUS) IMPLANT
DERMABOND ADVANCED (GAUZE/BANDAGES/DRESSINGS) ×1
DERMABOND ADVANCED .7 DNX12 (GAUZE/BANDAGES/DRESSINGS) IMPLANT
DEVICE DUBIN W/COMP PLATE 8390 (MISCELLANEOUS) IMPLANT
DRAPE LAPAROSCOPIC ABDOMINAL (DRAPES) IMPLANT
DRAPE LAPAROTOMY TRNSV 102X78 (DRAPE) IMPLANT
DRAPE PED LAPAROTOMY (DRAPES) ×2 IMPLANT
DRAPE UTILITY XL STRL (DRAPES) ×2 IMPLANT
ELECT REM PT RETURN 9FT ADLT (ELECTROSURGICAL) ×2
ELECTRODE REM PT RTRN 9FT ADLT (ELECTROSURGICAL) ×1 IMPLANT
GAUZE SPONGE 4X4 12PLY STRL LF (GAUZE/BANDAGES/DRESSINGS) IMPLANT
GAUZE SPONGE 4X4 16PLY XRAY LF (GAUZE/BANDAGES/DRESSINGS) IMPLANT
GLOVE BIO SURGEON STRL SZ7 (GLOVE) ×2 IMPLANT
GLOVE BIOGEL PI IND STRL 7.5 (GLOVE) IMPLANT
GLOVE BIOGEL PI INDICATOR 7.5 (GLOVE) ×2
GLOVE EUDERMIC 7 POWDERFREE (GLOVE) ×2 IMPLANT
GOWN PREVENTION PLUS XLARGE (GOWN DISPOSABLE) ×2 IMPLANT
GOWN PREVENTION PLUS XXLARGE (GOWN DISPOSABLE) ×3 IMPLANT
KIT MARKER MARGIN INK (KITS) IMPLANT
NDL HYPO 25X1 1.5 SAFETY (NEEDLE) ×1 IMPLANT
NEEDLE HYPO 22GX1.5 SAFETY (NEEDLE) IMPLANT
NEEDLE HYPO 25X1 1.5 SAFETY (NEEDLE) ×2 IMPLANT
NS IRRIG 1000ML POUR BTL (IV SOLUTION) ×2 IMPLANT
PACK BASIN DAY SURGERY FS (CUSTOM PROCEDURE TRAY) ×2 IMPLANT
PENCIL BUTTON HOLSTER BLD 10FT (ELECTRODE) ×2 IMPLANT
SLEEVE SCD COMPRESS KNEE MED (MISCELLANEOUS) ×1 IMPLANT
SPONGE LAP 4X18 X RAY DECT (DISPOSABLE) ×3 IMPLANT
STAPLER VISISTAT 35W (STAPLE) IMPLANT
STRIP CLOSURE SKIN 1/2X4 (GAUZE/BANDAGES/DRESSINGS) IMPLANT
SUT ETHILON 4 0 PS 2 18 (SUTURE) IMPLANT
SUT MNCRL AB 4-0 PS2 18 (SUTURE) ×1 IMPLANT
SUT SILK 2 0 SH (SUTURE) ×2 IMPLANT
SUT VIC AB 2-0 SH 27 (SUTURE)
SUT VIC AB 2-0 SH 27XBRD (SUTURE) IMPLANT
SUT VIC AB 3-0 FS2 27 (SUTURE) IMPLANT
SUT VIC AB 4-0 P-3 18XBRD (SUTURE) IMPLANT
SUT VIC AB 4-0 P3 18 (SUTURE)
SUT VICRYL 3-0 CR8 SH (SUTURE) ×2 IMPLANT
SUT VICRYL 4-0 PS2 18IN ABS (SUTURE) IMPLANT
SYR BULB 3OZ (MISCELLANEOUS) ×1 IMPLANT
SYR CONTROL 10ML LL (SYRINGE) ×2 IMPLANT
TAPE HYPAFIX 4 X10 (GAUZE/BANDAGES/DRESSINGS) IMPLANT
TOWEL OR NON WOVEN STRL DISP B (DISPOSABLE) ×2 IMPLANT
TUBE CONNECTING 20X1/4 (TUBING) ×2 IMPLANT
WATER STERILE IRR 1000ML POUR (IV SOLUTION) ×2 IMPLANT
YANKAUER SUCT BULB TIP NO VENT (SUCTIONS) ×2 IMPLANT

## 2012-03-02 NOTE — Addendum Note (Signed)
Addendum  created 03/02/12 1142 by Djuna Frechette D Ezekiah Massie, CRNA   Modules edited:Anesthesia Responsible Staff    

## 2012-03-02 NOTE — Anesthesia Postprocedure Evaluation (Signed)
  Anesthesia Post-op Note  Patient: Katherine [name]  Procedure(s) Performed: Procedure(s) (LRB) with comments: EXCISION DUCTAL SYSTEM BREAST (Left)  Patient Location: PACU  Anesthesia Type:General  Level of Consciousness: awake and alert   Airway and Oxygen Therapy: Patient Spontanous Breathing  Post-op Pain: mild  Post-op Assessment: Post-op Vital signs reviewed, Patient's Cardiovascular Status Stable, Respiratory Function Stable, Patent Airway, No signs of Nausea or vomiting and Pain level controlled  Post-op Vital Signs: stable  Complications: No apparent anesthesia complications

## 2012-03-02 NOTE — Interval H&P Note (Signed)
History and Physical Interval Note:  03/02/2012 7:15 AM  Katherine [name]  has presented today for surgery, with the diagnosis of Papilloma Left breast  The goals and the  various methods of treatment have been discussed with the patient and family. After consideration of risks, benefits and other options for treatment, the patient has consented to  Procedure(s) (LRB) with comments: EXCISION DUCTAL SYSTEM BREAST (Left) as a surgical intervention .  The patient's history has been reviewed, patient examined today, no change in status, stable for surgery.  I have reviewed the patient's chart and labs.  Questions were answered to the patient's satisfaction.     Ernestene Mention

## 2012-03-02 NOTE — Addendum Note (Signed)
Addendum  created 03/02/12 1142 by Jewel Baize Deone Leifheit, CRNA   Modules edited:Anesthesia Responsible Staff

## 2012-03-02 NOTE — Transfer of Care (Signed)
Immediate Anesthesia Transfer of Care Note  Patient: Katherine [name]  Procedure(s) Performed: Procedure(s) (LRB) with comments: EXCISION DUCTAL SYSTEM BREAST (Left)  Patient Location: PACU  Anesthesia Type:General  Level of Consciousness: awake and patient cooperative  Airway & Oxygen Therapy: Patient Spontanous Breathing and Patient connected to face mask oxygen  Post-op Assessment: Report given to PACU RN and Post -op Vital signs reviewed and stable  Post vital signs: Reviewed and stable  Complications: No apparent anesthesia complications

## 2012-03-02 NOTE — Anesthesia Procedure Notes (Signed)
Procedure Name: LMA Insertion Date/Time: 03/02/2012 7:39 AM Performed by: Gar Gibbon Pre-anesthesia Checklist: Patient identified, Emergency Drugs available, Suction available and Patient being monitored Patient Re-evaluated:Patient Re-evaluated prior to inductionOxygen Delivery Method: Circle System Utilized Preoxygenation: Pre-oxygenation with 100% oxygen Intubation Type: IV induction Ventilation: Mask ventilation without difficulty LMA: LMA inserted LMA Size: 4.0 Number of attempts: 1 Airway Equipment and Method: bite block Placement Confirmation: positive ETCO2 Tube secured with: Tape Dental Injury: Teeth and Oropharynx as per pre-operative assessment

## 2012-03-02 NOTE — Op Note (Signed)
Patient Name:           Katherine [name]   Date of Surgery:        03/02/2012  Pre op Diagnosis:      Spontaneous discharge left nipple, suspect intraductal papilloma, rule out cancer  Post op Diagnosis:    Same  Procedure:                 Left partial mastectomy with excision ductal system, upper outer quadrant  Surgeon:                     Angelia Mould. Derrell Lolling, M.D., FACS  Assistant:                      None  Operative Indications:   This is a 78 year old African American female with history of cerebrovascular accident on Plavix, left carotid endarterectomy, insulin-dependent diabetes, hypertension, chronic anemia, chronic kidney disease these she has had multiple breast biopsies in the past but does not have a history of breast cancer. She has recently developed a spontaneous discharge from her left nipple which is intermittently bloody and intermittently clear. This is reproducible in the office. Imaging studies show a masslike filling defect focally in the left breast at the 2:00 position, 2 cm from the  nipple measuring 1.3 cm in greatest dimension. Radiologic differential diagnoses include papilloma, papillary cancer, DCIS, or debris. It was too superficial for percutaneous biopsy. She is brought to the operating room for excision of this area and excision of the ductal system.  Operative Findings:       The nipple duct that was draining fluid was centrally located. I placed a small lacrimal duct probe in this and it was directed posteriorly and toward the 2:30 position. There was a papillomatous tumor that was found in the very superficial subareolar area around the involved duct, consistent with the diagnosis of papilloma.  Procedure in Detail:          Following the induction of general endotracheal anesthesia the patient's left breast was prepped and draped in a sterile fashion. Intravenous antibiotics were given. Surgical time out was performed. 0.5% Marcaine with epinephrine was used as a  local infiltration anesthetic. A curved incision was made at the areolar margin of the left breast from the 12:00 to the 3:00 position. Dissection was carried down into the breast tissue, initially dissecting under the nipple until I encountered the wire and also very superficially encountered a small 1 cm papillomatosis type tumor. Once I identified the involve nipple duct I removed the probe and then dissected around this area removing about a 2 cm or greater in diameter area of tissue in the subareolar and periareolar area. Suture was placed to mark the suspected papilloma and the tissue was sent to the lab with the history attached. Hemostasis was excellent and achieved with  electrocautery. The wound was irrigated with saline. The breast tissue was closed in several layers with interrupted sutures of 3-0 Vicryl and the skin closed with a running subcuticular suture of 4-0 Monocryl and Dermabond. The patient tolerated the procedure well and was taken to recovery room in stable condition. There were no complications. EBL 10 cc or less. Counts correct.     Angelia Mould. Derrell Lolling, M.D., FACS General and Minimally Invasive Surgery Breast and Colorectal Surgery  03/02/2012 8:19 AM

## 2012-03-02 NOTE — Progress Notes (Signed)
Called Dr. Jacinto Halim office and spoke with Britta Mccreedy --- explained to her that pt stated she did not have money for script for pain med ( hydrocodone) until Friday and that she wanted me to call script in to [facility]. Told her I explained to pt and her sister that I did not think they would fill her script and if they did it would be awhile before she got it. Asked pt if someone in her family or a friend could pay for script and she could pay them back on Friday. Britta Mccreedy and I discussed the above and told her pt is on Tramadol at home every 6 hours for pain - did she think Dr. Derrell Lolling would be OK with her taking the Tramadol until she can get the money of Friday? Britta Mccreedy said she would discuss this with Dr. Derrell Lolling. Told pt to use the Tramadol for pain.

## 2012-03-04 ENCOUNTER — Encounter (HOSPITAL_BASED_OUTPATIENT_CLINIC_OR_DEPARTMENT_OTHER): Payer: Self-pay | Admitting: General Surgery

## 2012-03-04 ENCOUNTER — Telehealth (INDEPENDENT_AMBULATORY_CARE_PROVIDER_SITE_OTHER): Payer: Self-pay | Admitting: General Surgery

## 2012-03-04 NOTE — Telephone Encounter (Signed)
Called patient per Dr. Derrell Lolling to advise of the pathology report per his request. "Tell her the intraductal papilloma is not a cancer and that no further surgery needs to be done. I will discuss this further with her in the office." Confirmed with the patient that she will be seen on 03/16/12. Patient pleased with pathology result.

## 2012-03-04 NOTE — Progress Notes (Signed)
Quick Note:  Inform patient of Pathology report,.Tell her that the intraductal papilloma is not a cancer and that no further surgery needs to be done. I will discuss this further with her in the office. ______

## 2012-03-16 ENCOUNTER — Ambulatory Visit (INDEPENDENT_AMBULATORY_CARE_PROVIDER_SITE_OTHER): Payer: Medicare Other | Admitting: General Surgery

## 2012-03-16 ENCOUNTER — Encounter (INDEPENDENT_AMBULATORY_CARE_PROVIDER_SITE_OTHER): Payer: Self-pay | Admitting: General Surgery

## 2012-03-16 VITALS — BP 150/74 | HR 80 | Temp 97.6°F | Resp 12 | Ht 67.5 in | Wt 185.2 lb

## 2012-03-16 DIAGNOSIS — D249 Benign neoplasm of unspecified breast: Secondary | ICD-10-CM

## 2012-03-16 DIAGNOSIS — N6459 Other signs and symptoms in breast: Secondary | ICD-10-CM

## 2012-03-16 DIAGNOSIS — N6452 Nipple discharge: Secondary | ICD-10-CM

## 2012-03-16 NOTE — Progress Notes (Signed)
Patient ID: Katherine [name], female   DOB: March 15, 1930, 79 y.o.   MRN: [id number] History: This patient underwent left partial mastectomy and excision of ductal system on 03/02/2012. She has no complaints about her breast healing. Final pathology shows a benign intraductal papilloma with negative margins.  Exam: Patient looks well. Breast shows that the incision at the areolar margin is healing without complication. No infection. No serosal. No hematoma. 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 Derrell Lolling, M.D., [facility] General and Minimally invasive Surgery Breast and Colorectal Surgery Office:   [phone] Pager:   [phone]

## 2012-03-16 NOTE — Patient Instructions (Signed)
Your left breast is healing without any obvious complication following the recent breast surgery.  The final pathology report shows a benign, noncancerous growth called intraductal papilloma. No further treatment should be necessary.  The drainage from your nipple should not recur.  You are advised to get a mammogram in one year and have your primary care physician perform a breast exam every year.  Return to see Dr. Derrell Lolling if further problems arise.

## 2012-07-05 ENCOUNTER — Other Ambulatory Visit: Payer: Self-pay

## 2012-07-05 DIAGNOSIS — Z1231 Encounter for screening mammogram for malignant neoplasm of breast: Secondary | ICD-10-CM

## 2012-08-16 ENCOUNTER — Other Ambulatory Visit: Payer: Self-pay | Admitting: Gastroenterology

## 2012-08-16 ENCOUNTER — Ambulatory Visit: Payer: Medicare Other

## 2012-08-27 ENCOUNTER — Other Ambulatory Visit (HOSPITAL_COMMUNITY): Payer: Self-pay | Admitting: Cardiology

## 2012-08-27 DIAGNOSIS — R079 Chest pain, unspecified: Secondary | ICD-10-CM

## 2012-09-06 ENCOUNTER — Encounter (HOSPITAL_COMMUNITY)
Admission: RE | Admit: 2012-09-06 | Discharge: 2012-09-06 | Disposition: A | Discharge status: Home / Self Care | Payer: Medicare Other | Source: Ambulatory Visit | Attending: Cardiology | Admitting: Cardiology

## 2012-09-06 ENCOUNTER — Other Ambulatory Visit: Payer: Self-pay

## 2012-09-06 ENCOUNTER — Ambulatory Visit (HOSPITAL_COMMUNITY)
Admission: RE | Admit: 2012-09-06 | Discharge: 2012-09-06 | Disposition: A | Discharge status: Home / Self Care | Payer: Medicare Other | Source: Ambulatory Visit | Attending: Cardiology | Admitting: Cardiology

## 2012-09-06 DIAGNOSIS — R079 Chest pain, unspecified: Secondary | ICD-10-CM

## 2012-09-06 MED ORDER — REGADENOSON 0.4 MG/5ML IV SOLN
INTRAVENOUS | Status: AC
  Filled 2012-09-06: qty 5

## 2012-09-06 MED ORDER — REGADENOSON 0.4 MG/5ML IV SOLN
0.4000 mg | Freq: Once | INTRAVENOUS | Status: AC
  Administered 2012-09-06: 0.4 mg via INTRAVENOUS

## 2012-09-06 MED ORDER — TECHNETIUM TC 99M SESTAMIBI GENERIC - CARDIOLITE
10.0000 | Freq: Once | INTRAVENOUS | Status: AC | PRN
  Administered 2012-09-06: 10 via INTRAVENOUS

## 2012-09-06 MED ORDER — TECHNETIUM TC 99M SESTAMIBI GENERIC - CARDIOLITE
30.0000 | Freq: Once | INTRAVENOUS | Status: AC | PRN
  Administered 2012-09-06: 30 via INTRAVENOUS

## 2012-09-10 ENCOUNTER — Ambulatory Visit (HOSPITAL_COMMUNITY)
Admission: RE | Admit: 2012-09-10 | Discharge: 2012-09-10 | Disposition: A | Discharge status: Home / Self Care | Payer: Medicare Other | Source: Ambulatory Visit | Attending: Gastroenterology | Admitting: Gastroenterology

## 2012-09-10 ENCOUNTER — Encounter (HOSPITAL_COMMUNITY): Payer: Self-pay | Admitting: *Deleted

## 2012-09-10 ENCOUNTER — Encounter (HOSPITAL_COMMUNITY)
Admission: RE | Disposition: A | Discharge status: Home / Self Care | Payer: Self-pay | Source: Ambulatory Visit | Attending: Gastroenterology

## 2012-09-10 DIAGNOSIS — K449 Diaphragmatic hernia without obstruction or gangrene: Secondary | ICD-10-CM | POA: Insufficient documentation

## 2012-09-10 DIAGNOSIS — D509 Iron deficiency anemia, unspecified: Secondary | ICD-10-CM | POA: Insufficient documentation

## 2012-09-10 DIAGNOSIS — K5520 Angiodysplasia of colon without hemorrhage: Secondary | ICD-10-CM | POA: Insufficient documentation

## 2012-09-10 HISTORY — PX: HOT HEMOSTASIS: SHX5433

## 2012-09-10 HISTORY — PX: ESOPHAGOGASTRODUODENOSCOPY: SHX5428

## 2012-09-10 SURGERY — EGD (ESOPHAGOGASTRODUODENOSCOPY)
Anesthesia: Moderate Sedation

## 2012-09-10 MED ORDER — DIPHENHYDRAMINE HCL 50 MG/ML IJ SOLN
INTRAMUSCULAR | Status: AC
  Filled 2012-09-10: qty 1

## 2012-09-10 MED ORDER — FENTANYL CITRATE 0.05 MG/ML IJ SOLN
INTRAMUSCULAR | Status: AC
  Filled 2012-09-10: qty 2

## 2012-09-10 MED ORDER — MIDAZOLAM HCL 10 MG/2ML IJ SOLN
INTRAMUSCULAR | Status: DC | PRN
  Administered 2012-09-10 (×3): 2 mg via INTRAVENOUS
  Administered 2012-09-10: 1 mg via INTRAVENOUS

## 2012-09-10 MED ORDER — SODIUM CHLORIDE 0.9 % IV SOLN
INTRAVENOUS | Status: DC
  Administered 2012-09-10: 500 mL via INTRAVENOUS

## 2012-09-10 MED ORDER — MIDAZOLAM HCL 10 MG/2ML IJ SOLN
INTRAMUSCULAR | Status: AC
  Filled 2012-09-10: qty 2

## 2012-09-10 MED ORDER — FENTANYL CITRATE 0.05 MG/ML IJ SOLN
INTRAMUSCULAR | Status: DC | PRN
  Administered 2012-09-10 (×3): 25 ug via INTRAVENOUS

## 2012-09-10 NOTE — H&P (Signed)
   Felecia Shelling HPI: The patient continues to have a persistent anemia with heme positive stool.  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 Princess Bruins, and [place].  Since she has a persistent anemia, heme positive stool, and a history of small bowel AVMs a repeat EGD with APC was scheduled.  Past Medical History  Diagnosis Date  . Bloody discharge from nipple   . Hypertension   . Stroke   . Anemia     mild  . Diabetes mellitus without complication     type 2  . Carotid artery stenosis   . Full dentures   . Wears glasses     Past Surgical History  Procedure Laterality Date  . Cholecystectomy    . Appendectomy    . Eye surgery      right-glaucoma  . Abdominal hysterectomy    . Cataract extraction      left  . Dilation and curettage of uterus    . Tubal ligation    . Breast surgery      bx rt and lt   . Breast ductal system excision  03/02/2012    Procedure: EXCISION DUCTAL SYSTEM BREAST;  Surgeon: Ernestene Mention, MD;  Location: [facility];  Service: General;  Laterality: Left;  . Angiodysplasia    . Dvt    . Nausea & vomiting    . Rectal bleeding    . Abdominal pain, rlq    . Constipation      History reviewed. No pertinent family history.  Social History:  reports that she quit smoking about 10 years ago. She has never used smokeless tobacco. She reports that she does not drink alcohol or use illicit drugs.  Allergies:  Allergies  Allergen Reactions  . Aspirin Hives  . Blue Dyes (Parenteral) Hives  . Contrast Media (Iodinated Diagnostic Agents)   . Ibuprofen Hives  . Iohexol   . Shellfish-Derived Products Hives    Medications: Scheduled: Continuous:  No results found for this or any previous visit (from the past 24 hour(s)).   No results found.  ROS:  As stated above in the HPI otherwise negative.  Blood pressure 126/72, temperature 98.2 F (36.8 C), temperature source Oral, resp. rate 13, height 5'  6" (1.676 m), weight 184 lb (83.462 kg), SpO2 100.00%.    PE: Gen: NAD, Alert and Oriented HEENT:  [place]/AT, EOMI Neck: Supple, no LAD Lungs: CTA Bilaterally CV: RRR without M/G/R ABM: Soft, NTND, +BS Ext: No C/C/E  Assessment/Plan: 1) Anemia. 2) Heme positive stool. 3) History of AVMs.  Plan: 1) EGD with APC.  Juriel Cid D 09/10/2012, 11:31 AM

## 2012-09-10 NOTE — Op Note (Signed)
[facility] [address] [place], [address]   OPERATIVE PROCEDURE REPORT  PATIENT: Katherine [name], Katherine [name]  MR#: [id number] BIRTHDATE: 17-Jun-1930  GENDER: Female ENDOSCOPIST: Jeani Hawking, MD ASSISTANT:   Ennis Forts, RN and Windell Hummingbird, technician PROCEDURE DATE: 09/10/2012 PROCEDURE:   EGD w/ ablation ASA CLASS:   Class III INDICATIONS:Iron deficiency anemia. MEDICATIONS: Versed 7 mg IV and Fentanyl 75 mcg IV TOPICAL ANESTHETIC:   none  DESCRIPTION OF PROCEDURE:   After the risks benefits and alternatives of the procedure were thoroughly explained, informed consent was obtained.  The endoscope [id number]  endoscope was introduced through the mouth  and advanced to the second portion of the duodenum Without limitations.      The instrument was slowly withdrawn as the mucosa was fully examined.      FINDINGS: A small hiatal hernia was noted in the distal esohpagus. the gastric lumen was normal.  In the proximal and distal portion of D2 two nonbleeding AVMs were identified.  Both were ablated with APC.  Some oozing was noted, but the bleeding ultimately stopped without any further intervention.   Retroflexed views revealed no abnormalities.     The scope was then withdrawn from the patient and the procedure terminated.  COMPLICATIONS: There were no complications. IMPRESSION: 1) Two nonbleeding AVMs in D2 s/p APC.  RECOMMENDATIONS: 1) Follow [facility] in one month.  _______________________________ eSignedJeani Hawking, MD 09/10/2012 12:13 PM

## 2012-09-13 ENCOUNTER — Encounter (HOSPITAL_COMMUNITY): Payer: Self-pay | Admitting: Gastroenterology

## 2012-10-06 ENCOUNTER — Other Ambulatory Visit: Payer: Self-pay | Admitting: Gastroenterology

## 2012-10-15 ENCOUNTER — Encounter (HOSPITAL_COMMUNITY)
Admission: RE | Disposition: A | Discharge status: Home / Self Care | Payer: Self-pay | Source: Ambulatory Visit | Attending: Gastroenterology

## 2012-10-15 ENCOUNTER — Encounter (HOSPITAL_COMMUNITY): Payer: Self-pay | Admitting: *Deleted

## 2012-10-15 ENCOUNTER — Ambulatory Visit (HOSPITAL_COMMUNITY)
Admission: RE | Admit: 2012-10-15 | Discharge: 2012-10-15 | Disposition: A | Discharge status: Home / Self Care | Payer: Medicare Other | Source: Ambulatory Visit | Attending: Gastroenterology | Admitting: Gastroenterology

## 2012-10-15 DIAGNOSIS — I10 Essential (primary) hypertension: Secondary | ICD-10-CM | POA: Insufficient documentation

## 2012-10-15 DIAGNOSIS — K644 Residual hemorrhoidal skin tags: Secondary | ICD-10-CM | POA: Insufficient documentation

## 2012-10-15 DIAGNOSIS — E119 Type 2 diabetes mellitus without complications: Secondary | ICD-10-CM | POA: Insufficient documentation

## 2012-10-15 DIAGNOSIS — Z8673 Personal history of transient ischemic attack (TIA), and cerebral infarction without residual deficits: Secondary | ICD-10-CM | POA: Insufficient documentation

## 2012-10-15 DIAGNOSIS — D126 Benign neoplasm of colon, unspecified: Secondary | ICD-10-CM | POA: Insufficient documentation

## 2012-10-15 DIAGNOSIS — K648 Other hemorrhoids: Secondary | ICD-10-CM | POA: Insufficient documentation

## 2012-10-15 HISTORY — PX: COLONOSCOPY: SHX5424

## 2012-10-15 LAB — GLUCOSE, CAPILLARY: Glucose-Capillary: 85 mg/dL (ref 70–99)

## 2012-10-15 SURGERY — COLONOSCOPY
Anesthesia: Moderate Sedation

## 2012-10-15 MED ORDER — MIDAZOLAM HCL 10 MG/2ML IJ SOLN
INTRAMUSCULAR | Status: AC
  Filled 2012-10-15: qty 2

## 2012-10-15 MED ORDER — MIDAZOLAM HCL 5 MG/5ML IJ SOLN
INTRAMUSCULAR | Status: DC | PRN
  Administered 2012-10-15: 1 mg via INTRAVENOUS
  Administered 2012-10-15: 2 mg via INTRAVENOUS
  Administered 2012-10-15: 1 mg via INTRAVENOUS
  Administered 2012-10-15: 2 mg via INTRAVENOUS
  Administered 2012-10-15: 1 mg via INTRAVENOUS

## 2012-10-15 MED ORDER — FENTANYL CITRATE 0.05 MG/ML IJ SOLN
INTRAMUSCULAR | Status: DC | PRN
  Administered 2012-10-15 (×3): 25 ug via INTRAVENOUS

## 2012-10-15 MED ORDER — SODIUM CHLORIDE 0.9 % IV SOLN
INTRAVENOUS | Status: DC
  Administered 2012-10-15: 10:00:00 via INTRAVENOUS

## 2012-10-15 MED ORDER — FENTANYL CITRATE 0.05 MG/ML IJ SOLN
INTRAMUSCULAR | Status: AC
  Filled 2012-10-15: qty 2

## 2012-10-15 NOTE — Op Note (Signed)
[facility] [address] [place], [address]   OPERATIVE PROCEDURE REPORT  PATIENT: Katherine [name], Katherine [name]  MR#: [id number] BIRTHDATE: [birth year]  GENDER: Female ENDOSCOPIST: Jeani Hawking, MD ASSISTANT:   Percival Spanish, Technician Dwain Sarna, RN CGRN PROCEDURE DATE: 10/15/2012 PROCEDURE:   Colonoscopy with snare polypectomy ASA CLASS:   Class III INDICATIONS:Patient's personal history of colon polyps. MEDICATIONS: Versed 5 mg IV and Fentanyl 75 mcg IV  DESCRIPTION OF PROCEDURE:   After the risks benefits and alternatives of the procedure were thoroughly explained, informed consent was obtained.  A digital rectal exam revealed no abnormalities of the rectum.    The Pentax Colonoscope [id number] endoscope was introduced through the anus  and advanced to the cecum, which was identified by both the appendix and ileocecal valve , No adverse events experienced.    The quality of the prep was good. .  The instrument was then slowly withdrawn as the colon was fully examined.     FINDINGS: A 1 mm sessiel ascending colon polyp was removed with a cold biopsy forcep.  A 3 mm sessile ascending colon polypwas removed with a cold snare.   A significant amount of liquid stool was suctioned during the procedure, but good to excellent views of the mucosa were obtained.  No other abnormalities noted. Retroflexed views revealed internal/external hemorrhoids.     The scope was then withdrawn from the patient and the procedure terminated.  COMPLICATIONS: There were no complications.  IMPRESSION: 1) Polyps. 2) Int/Ext Hemorrhoids.  RECOMMENDATIONS: 1) Await biopsy results.  _______________________________ eSignedJeani Hawking, MD 10/15/2012 10:43 AM

## 2012-10-15 NOTE — H&P (Signed)
Reason for Consult: Personal history of polyps Referring Physician: Donia Guiles, M.D.  Felecia Shelling HPI: the patient was identified to have a descending colon polyp on 10/2007.  The polyp was not able to be recovered during the colonoscopy, but the gross appearance was consistent with an adenoma.  Past Medical History  Diagnosis Date  . Bloody discharge from nipple   . Hypertension   . Stroke   . Anemia     mild  . Diabetes mellitus without complication     type 2  . Carotid artery stenosis   . Full dentures   . Wears glasses     Past Surgical History  Procedure Laterality Date  . Cholecystectomy    . Appendectomy    . Eye surgery      right-glaucoma  . Abdominal hysterectomy    . Cataract extraction      left  . Dilation and curettage of uterus    . Tubal ligation    . Breast surgery      bx rt and lt   . Breast ductal system excision  03/02/2012    Procedure: EXCISION DUCTAL SYSTEM BREAST;  Surgeon: Ernestene Mention, MD;  Location: [facility];  Service: General;  Laterality: Left;  . Angiodysplasia    . Dvt    . Nausea & vomiting    . Rectal bleeding    . Abdominal pain, rlq    . Constipation    . Esophagogastroduodenoscopy N/A 09/10/2012    Procedure: ESOPHAGOGASTRODUODENOSCOPY (EGD);  Surgeon: Theda Belfast, MD;  Location: [facility];  Service: Endoscopy;  Laterality: N/A;  . Hot hemostasis N/A 09/10/2012    Procedure: HOT HEMOSTASIS (ARGON PLASMA COAGULATION/BICAP);  Surgeon: Theda Belfast, MD;  Location: [facility];  Service: Endoscopy;  Laterality: N/A;    History reviewed. No pertinent family history.  Social History:  reports that she quit smoking about 10 years ago. She has never used smokeless tobacco. She reports that she does not drink alcohol or use illicit drugs.  Allergies:  Allergies  Allergen Reactions  . Aspirin Hives  . Blue Dyes (Parenteral) Hives  . Contrast Media [Iodinated Diagnostic Agents]   . Ibuprofen Hives  .  Iohexol   . Shellfish-Derived Products Hives    Medications:  Scheduled:  Continuous: . sodium chloride 20 mL/hr at 10/15/12 1610    Results for orders placed during the hospital encounter of 10/15/12 (from the past 24 hour(s))  GLUCOSE, CAPILLARY     Status: None   Collection Time    10/15/12  9:25 AM      Result Value Range   Glucose-Capillary 85  70 - 99 mg/dL     No results found.  ROS:  As stated above in the HPI otherwise negative.  Blood pressure 134/75, temperature 98.4 F (36.9 C), temperature source Oral, resp. rate 13, height 5\' 7"  (1.702 m), weight 184 lb (83.462 kg), SpO2 99.00%.    PE: Gen: NAD, Alert and Oriented HEENT:  [place]/AT, EOMI Neck: Supple, no LAD Lungs: CTA Bilaterally CV: RRR without M/G/R ABM: Soft, NTND, +BS Ext: No C/C/E  Assessment/Plan: 1) Personal history of polyps.  Plan: 1) Colonoscopy today.  Zhara Gieske D 10/15/2012, 9:50 AM

## 2012-10-18 ENCOUNTER — Encounter (HOSPITAL_COMMUNITY): Payer: Self-pay | Admitting: Gastroenterology

## 2012-11-17 ENCOUNTER — Ambulatory Visit (HOSPITAL_COMMUNITY)
Admission: RE | Admit: 2012-11-17 | Discharge: 2012-11-17 | Disposition: A | Discharge status: Home / Self Care | Payer: Medicare Other | Source: Ambulatory Visit | Attending: Cardiology | Admitting: Cardiology

## 2012-11-17 ENCOUNTER — Other Ambulatory Visit (HOSPITAL_COMMUNITY): Payer: Self-pay | Admitting: Cardiology

## 2012-11-17 DIAGNOSIS — R52 Pain, unspecified: Secondary | ICD-10-CM

## 2012-11-17 DIAGNOSIS — I998 Other disorder of circulatory system: Secondary | ICD-10-CM | POA: Insufficient documentation

## 2012-11-17 DIAGNOSIS — Z9089 Acquired absence of other organs: Secondary | ICD-10-CM | POA: Insufficient documentation

## 2012-11-17 DIAGNOSIS — R109 Unspecified abdominal pain: Secondary | ICD-10-CM | POA: Insufficient documentation

## 2012-11-17 LAB — LIPASE, BLOOD: Lipase: 41 U/L (ref 11–59)

## 2012-11-17 LAB — AMYLASE: Amylase: 98 U/L (ref 0–105)

## 2012-11-23 ENCOUNTER — Encounter (HOSPITAL_COMMUNITY): Payer: Self-pay

## 2012-11-23 ENCOUNTER — Emergency Department (HOSPITAL_COMMUNITY)
Admission: EM | Admit: 2012-11-23 | Discharge: 2012-11-23 | Disposition: A | Discharge status: Home / Self Care | Payer: Medicare Other | Attending: Emergency Medicine | Admitting: Emergency Medicine

## 2012-11-23 ENCOUNTER — Emergency Department (HOSPITAL_COMMUNITY): Payer: Medicare Other

## 2012-11-23 DIAGNOSIS — Y929 Unspecified place or not applicable: Secondary | ICD-10-CM | POA: Insufficient documentation

## 2012-11-23 DIAGNOSIS — D649 Anemia, unspecified: Secondary | ICD-10-CM | POA: Insufficient documentation

## 2012-11-23 DIAGNOSIS — W268XXA Contact with other sharp object(s), not elsewhere classified, initial encounter: Secondary | ICD-10-CM | POA: Insufficient documentation

## 2012-11-23 DIAGNOSIS — Z23 Encounter for immunization: Secondary | ICD-10-CM | POA: Insufficient documentation

## 2012-11-23 DIAGNOSIS — E119 Type 2 diabetes mellitus without complications: Secondary | ICD-10-CM | POA: Insufficient documentation

## 2012-11-23 DIAGNOSIS — Z87891 Personal history of nicotine dependence: Secondary | ICD-10-CM | POA: Insufficient documentation

## 2012-11-23 DIAGNOSIS — Z794 Long term (current) use of insulin: Secondary | ICD-10-CM | POA: Insufficient documentation

## 2012-11-23 DIAGNOSIS — S61219A Laceration without foreign body of unspecified finger without damage to nail, initial encounter: Secondary | ICD-10-CM

## 2012-11-23 DIAGNOSIS — Y9389 Activity, other specified: Secondary | ICD-10-CM | POA: Insufficient documentation

## 2012-11-23 DIAGNOSIS — S61209A Unspecified open wound of unspecified finger without damage to nail, initial encounter: Secondary | ICD-10-CM | POA: Insufficient documentation

## 2012-11-23 DIAGNOSIS — I10 Essential (primary) hypertension: Secondary | ICD-10-CM | POA: Insufficient documentation

## 2012-11-23 DIAGNOSIS — Z8673 Personal history of transient ischemic attack (TIA), and cerebral infarction without residual deficits: Secondary | ICD-10-CM | POA: Insufficient documentation

## 2012-11-23 DIAGNOSIS — Z79899 Other long term (current) drug therapy: Secondary | ICD-10-CM | POA: Insufficient documentation

## 2012-11-23 LAB — CBC
MCHC: 31.1 g/dL (ref 30.0–36.0)
Platelets: 307 10*3/uL (ref 150–400)
RDW: 17.8 % — ABNORMAL HIGH (ref 11.5–15.5)
WBC: 4.7 10*3/uL (ref 4.0–10.5)

## 2012-11-23 MED ORDER — TETANUS-DIPHTH-ACELL PERTUSSIS 5-2.5-18.5 LF-MCG/0.5 IM SUSP
0.5000 mL | Freq: Once | INTRAMUSCULAR | Status: AC
  Administered 2012-11-23: 0.5 mL via INTRAMUSCULAR
  Filled 2012-11-23: qty 0.5

## 2012-11-23 NOTE — ED Provider Notes (Signed)
CSN: [id number]     Arrival date & time 11/23/12  1032 History  This chart was scribed for Antony Madura, PA, working with Toy Baker, MD by Blanchard Kelch, ED Scribe. This patient was seen in room TR10C/TR10C and the patient's care was started at 10:52 AM.    Chief Complaint  Patient presents with  . Extremity Laceration    Patient is a 79 y.o. female presenting with skin laceration. The history is provided by the patient. No language interpreter was used.  Laceration Location:  Finger Finger laceration location:  R index finger Length (cm):  1.5 Time since incident:  1 day Laceration mechanism:  Metal edge Foreign body present:  No foreign bodies Tetanus status:  Unknown   HPI Comments: Katherine [name] is a 79 y.o. female who presents to the Emergency Department due to a right finger laceration that occurred last night when she cut it opening a can of pineapple. Patient applied pressure dressing at home with temporary relief of bleeding which recurred this AM after removing the tape. She complains of constant, nonradiating, throbbing pain with associated numbness to the affected finger. She denies current use of anticoagulants, though admits to past use secondary to stroke hx. Patient endorses weakness to R side at baseline 2/2 to stroke and denies any worsening weakness. She is unable to recall the date of her last tetanus.   Past Medical History  Diagnosis Date  . Bloody discharge from nipple   . Hypertension   . Stroke   . Anemia     mild  . Diabetes mellitus without complication     type 2  . Carotid artery stenosis   . Full dentures   . Wears glasses    Past Surgical History  Procedure Laterality Date  . Cholecystectomy    . Appendectomy    . Eye surgery      right-glaucoma  . Abdominal hysterectomy    . Cataract extraction      left  . Dilation and curettage of uterus    . Tubal ligation    . Breast surgery      bx rt and lt   . Breast ductal system excision   03/02/2012    Procedure: EXCISION DUCTAL SYSTEM BREAST;  Surgeon: Ernestene Mention, MD;  Location: [facility];  Service: General;  Laterality: Left;  . Angiodysplasia    . Dvt    . Nausea & vomiting    . Rectal bleeding    . Abdominal pain, rlq    . Constipation    . Esophagogastroduodenoscopy N/A 09/10/2012    Procedure: ESOPHAGOGASTRODUODENOSCOPY (EGD);  Surgeon: Theda Belfast, MD;  Location: [facility];  Service: Endoscopy;  Laterality: N/A;  . Hot hemostasis N/A 09/10/2012    Procedure: HOT HEMOSTASIS (ARGON PLASMA COAGULATION/BICAP);  Surgeon: Theda Belfast, MD;  Location: [facility];  Service: Endoscopy;  Laterality: N/A;  . Colonoscopy N/A 10/15/2012    Procedure: COLONOSCOPY;  Surgeon: Theda Belfast, MD;  Location: [facility];  Service: Endoscopy;  Laterality: N/A;   No family history on file. History  Substance Use Topics  . Smoking status: Former Smoker    Quit date: 02/25/2002  . Smokeless tobacco: Never Used  . Alcohol Use: No   OB History   Grav Para Term Preterm Abortions TAB SAB Ect Mult Living                 Review of Systems  Musculoskeletal:       +  pain to R index finger  Skin: Positive for wound.  Neurological: Positive for numbness. Negative for weakness.  All other systems reviewed and are negative.    Allergies  Aspirin; Blue dyes (parenteral); Contrast media; Ibuprofen; Iohexol; and Shellfish-derived products  Home Medications   Current Outpatient Rx  Name  Route  Sig  Dispense  Refill  . amLODipine-valsartan (EXFORGE) 5-160 MG per tablet   Oral   Take 1 tablet by mouth daily.         Marland Kitchen atorvastatin (LIPITOR) 40 MG tablet   Oral   Take 40 mg by mouth daily.         . bimatoprost (LUMIGAN) 0.01 % SOLN   Both Eyes   Place 1 drop into both eyes at bedtime.         . brimonidine (ALPHAGAN P) 0.1 % SOLN   Both Eyes   Place 1 drop into both eyes every 8 (eight) hours.          . colchicine 0.6 MG tablet    Oral   Take 0.6 mg by mouth daily.         . dorzolamide (TRUSOPT) 2 % ophthalmic solution   Both Eyes   Place 1 drop into both eyes 2 (two) times daily.         . febuxostat (ULORIC) 40 MG tablet   Oral   Take 40 mg by mouth 2 (two) times daily.         . insulin detemir (LEVEMIR) 100 UNIT/ML injection   Subcutaneous   Inject 30 Units into the skin 3 (three) times daily.          Marland Kitchen loperamide (IMODIUM) 2 MG capsule   Oral   Take 2 mg by mouth 4 (four) times daily as needed. For diarrhea         . LORazepam (ATIVAN) 0.5 MG tablet   Oral   Take 0.5 mg by mouth every 8 (eight) hours.         Marland Kitchen omeprazole (PRILOSEC) 40 MG capsule   Oral   Take 40 mg by mouth 2 (two) times daily.         . pilocarpine (PILOCAR) 4 % ophthalmic solution   Both Eyes   Place 1 drop into both eyes 2 (two) times daily.         . pioglitazone (ACTOS) 15 MG tablet   Oral   Take 15 mg by mouth daily.         Marland Kitchen torsemide (DEMADEX) 20 MG tablet   Oral   Take 20 mg by mouth daily.         . traMADol (ULTRAM) 50 MG tablet   Oral   Take 50 mg by mouth every 6 (six) hours as needed. For pain         . triamcinolone cream (KENALOG) 0.1 %   Topical   Apply 1 application topically 2 (two) times daily.         Marland Kitchen zolpidem (AMBIEN) 5 MG tablet   Oral   Take 5 mg by mouth at bedtime as needed for sleep.           Triage Vitals: BP 130/73  Pulse 62  Temp(Src) 98.5 F (36.9 C)  Resp 16  SpO2 100%  Physical Exam  Nursing note and vitals reviewed. Constitutional: She is oriented to person, place, and time. She appears well-developed and well-nourished. No distress.  HENT:  Head: Normocephalic and atraumatic.  Eyes:  Conjunctivae and EOM are normal. No scleral icterus.  Neck: Normal range of motion.  Cardiovascular: Normal rate, regular rhythm and intact distal pulses.   Capillary refill normal in R index finger and hand. Distal radial pulses 2+ b/l.  Pulmonary/Chest:  Effort normal. No respiratory distress.  Musculoskeletal: Normal range of motion.  5/5 strength against resistance of FDP, FDS and extensors of R index finger. Finger to thumb opposition intact on R hand.  Neurological: She is alert and oriented to person, place, and time.  No gross sensory or motor deficits of R hand.  Skin: Skin is warm and dry. No rash noted. She is not diaphoretic. No erythema.  Laceration on palmar aspect or right index finger approximately 1.5 cm in length.  Psychiatric: She has a normal mood and affect. Her behavior is normal.    ED Course  Procedures (including critical care time)  DIAGNOSTIC STUDIES: Oxygen Saturation is 100% on room air, normal by my interpretation.    COORDINATION OF CARE:  10:58 AM -Will order Boostrix injection, Right Index Finger x-ray and CBC. Patient verbalizes understanding and agrees with treatment plan.   Labs Review Labs Reviewed  CBC - Abnormal; Notable for the following:    RBC 3.83 (*)    Hemoglobin 9.0 (*)    HCT 28.9 (*)    MCV 75.5 (*)    MCH 23.5 (*)    RDW 17.8 (*)    All other components within normal limits   Imaging Review Dg Finger Index Right  11/23/2012   CLINICAL DATA:  Laceration.  EXAM: RIGHT INDEX FINGER 2+V  COMPARISON:  None.  FINDINGS: No fracture or dislocation. No radiopaque foreign body. Soft tissue swelling. Degenerative changes.  IMPRESSION: No fracture or dislocation. No radiopaque foreign body.   Electronically Signed   By: Bridgett Larsson   On: 11/23/2012 11:21   LACERATION REPAIR Performed by: Antony Madura Authorized by: Antony Madura Consent: Verbal consent obtained. Risks and benefits: risks, benefits and alternatives were discussed Consent given by: patient Patient identity confirmed: provided demographic data Prepped and Draped in normal sterile fashion Wound explored  Laceration Location: R index  Laceration Length: 1.5cm  No Foreign Bodies seen or palpated  Anesthesia: local  infiltration and digital block  Local anesthetic: lidocaine 2% without epinephrine  Anesthetic total: 3 ml  Irrigation method: syringe Amount of cleaning: standard  Skin closure: 5-0 prolene  Number of sutures: 2  Technique: simple interrupted  Patient tolerance: Patient tolerated the procedure well with no immediate complications.   MDM   1. Laceration of finger, initial encounter   2. Anemia    11:30 - Uncomplicated laceration to right index finger. No fractures or foreign bodies identified on x-ray. Strength against resistance of FDP, FDS, and extensors intact. No sensory or motor deficits appreciated and patient neurovascularly intact. Bleeding controlled in ED with pressure and sutures applied. Patient tolerated procedure well. CBC pending.  1:15 - Hemoglobin today 9.0. Have spoken with Dr. Shana Chute, the patient's PCP, regarding Hbg levels. He states he has been looking into the patient's anemia recently. Dr. Magda Kiel office researched the patient's file and verbally communicated to be a Hbg level of 9.1 on 11/02/2012. As H/H stable compared to most recent labs, believe patient is appropriate for d/c with hand specialist follow up regarding laceration. Patient advised to return to her primary care provider in 10-12 days for suture removal as well as for followup regarding her anemia. Patient advised to change dressing daily to keep the  area clean and dry. Return precautions discussed and patient agreeable to plan with no unaddressed concerns   I personally performed the services described in this documentation, which was scribed in my presence. The recorded information has been reviewed and is accurate.     Antony Madura, PA-C 11/23/12 1635

## 2012-11-23 NOTE — ED Provider Notes (Signed)
Medical screening examination/treatment/procedure(s) were conducted as a shared visit with non-physician practitioner(s) and myself.  I personally evaluated the patient during the encounter  Patient's tetanus status updated and laceration repaired.  Toy Baker, MD 11/23/12 1137

## 2012-11-23 NOTE — ED Notes (Addendum)
Pt states she cut her finger right index finger on a can of fruit last night.  Pt states it was bleeding and she wrapped her finger.  Pt currently has finger wrapped with tape.  Tape removed by this RN and laceration continues to ooze blood.

## 2012-11-26 NOTE — ED Provider Notes (Signed)
Medical screening examination/treatment/procedure(s) were performed by non-physician practitioner and as supervising physician I was immediately available for consultation/collaboration.  Naela Nodal T Montrae Braithwaite, MD 11/26/12 1756 

## 2012-11-29 ENCOUNTER — Other Ambulatory Visit (HOSPITAL_COMMUNITY): Payer: Self-pay | Admitting: Cardiology

## 2012-11-29 ENCOUNTER — Ambulatory Visit (HOSPITAL_COMMUNITY)
Admission: RE | Admit: 2012-11-29 | Discharge: 2012-11-29 | Disposition: A | Discharge status: Home / Self Care | Payer: Medicare Other | Source: Ambulatory Visit | Attending: Cardiology | Admitting: Cardiology

## 2012-11-29 DIAGNOSIS — M1990 Unspecified osteoarthritis, unspecified site: Secondary | ICD-10-CM

## 2012-11-29 DIAGNOSIS — M1710 Unilateral primary osteoarthritis, unspecified knee: Secondary | ICD-10-CM | POA: Insufficient documentation

## 2012-11-29 DIAGNOSIS — IMO0002 Reserved for concepts with insufficient information to code with codable children: Secondary | ICD-10-CM | POA: Insufficient documentation

## 2012-11-29 LAB — CBC
Hemoglobin: 9 g/dL — ABNORMAL LOW (ref 12.0–15.0)
MCH: 23.5 pg — ABNORMAL LOW (ref 26.0–34.0)
MCHC: 31 g/dL (ref 30.0–36.0)
Platelets: 322 10*3/uL (ref 150–400)
RBC: 3.83 MIL/uL — ABNORMAL LOW (ref 3.87–5.11)

## 2012-11-29 LAB — IRON AND TIBC
Iron: 20 ug/dL — ABNORMAL LOW (ref 42–135)
TIBC: 393 ug/dL (ref 250–470)
UIBC: 373 ug/dL (ref 125–400)

## 2012-11-29 LAB — RETICULOCYTES: Retic Ct Pct: 2.3 % (ref 0.4–3.1)

## 2012-12-15 ENCOUNTER — Other Ambulatory Visit (HOSPITAL_COMMUNITY): Payer: Self-pay | Admitting: Cardiology

## 2012-12-15 DIAGNOSIS — M25561 Pain in right knee: Secondary | ICD-10-CM

## 2012-12-15 DIAGNOSIS — M7989 Other specified soft tissue disorders: Secondary | ICD-10-CM

## 2012-12-17 ENCOUNTER — Ambulatory Visit (HOSPITAL_COMMUNITY)
Admission: RE | Admit: 2012-12-17 | Discharge: 2012-12-17 | Disposition: A | Discharge status: Home / Self Care | Payer: Medicare Other | Source: Ambulatory Visit | Attending: Cardiology | Admitting: Cardiology

## 2012-12-17 DIAGNOSIS — M79609 Pain in unspecified limb: Secondary | ICD-10-CM

## 2012-12-17 DIAGNOSIS — M25561 Pain in right knee: Secondary | ICD-10-CM

## 2012-12-17 DIAGNOSIS — M7989 Other specified soft tissue disorders: Secondary | ICD-10-CM | POA: Insufficient documentation

## 2012-12-17 NOTE — Progress Notes (Signed)
VASCULAR LAB PRELIMINARY  PRELIMINARY  PRELIMINARY  PRELIMINARY  Right lower extremity venous duplex completed.    Preliminary report:  Right:  No evidence of DVT, superficial thrombosis, or Baker's cyst.  Irais Mottram, RVS 12/17/2012, 3:36 PM

## 2013-01-10 ENCOUNTER — Other Ambulatory Visit (HOSPITAL_COMMUNITY): Payer: Self-pay | Admitting: Cardiology

## 2013-01-10 DIAGNOSIS — R109 Unspecified abdominal pain: Secondary | ICD-10-CM

## 2013-01-10 DIAGNOSIS — R1110 Vomiting, unspecified: Secondary | ICD-10-CM

## 2013-01-11 ENCOUNTER — Other Ambulatory Visit (HOSPITAL_COMMUNITY): Payer: Self-pay | Admitting: Cardiology

## 2013-01-11 ENCOUNTER — Ambulatory Visit (HOSPITAL_COMMUNITY)
Admission: RE | Admit: 2013-01-11 | Discharge: 2013-01-11 | Disposition: A | Discharge status: Home / Self Care | Payer: Medicare Other | Source: Ambulatory Visit | Attending: Cardiology | Admitting: Cardiology

## 2013-01-11 DIAGNOSIS — R109 Unspecified abdominal pain: Secondary | ICD-10-CM

## 2013-01-11 DIAGNOSIS — K449 Diaphragmatic hernia without obstruction or gangrene: Secondary | ICD-10-CM | POA: Insufficient documentation

## 2013-01-11 DIAGNOSIS — K5900 Constipation, unspecified: Secondary | ICD-10-CM | POA: Insufficient documentation

## 2013-01-11 DIAGNOSIS — K225 Diverticulum of esophagus, acquired: Secondary | ICD-10-CM | POA: Insufficient documentation

## 2013-01-11 DIAGNOSIS — R1110 Vomiting, unspecified: Secondary | ICD-10-CM

## 2013-01-11 DIAGNOSIS — K224 Dyskinesia of esophagus: Secondary | ICD-10-CM | POA: Insufficient documentation

## 2013-01-11 DIAGNOSIS — R1310 Dysphagia, unspecified: Secondary | ICD-10-CM | POA: Insufficient documentation

## 2013-01-11 LAB — BASIC METABOLIC PANEL
CO2: 23 mEq/L (ref 19–32)
Calcium: 9.3 mg/dL (ref 8.4–10.5)
Chloride: 107 mEq/L (ref 96–112)
Glucose, Bld: 120 mg/dL — ABNORMAL HIGH (ref 70–99)
Sodium: 140 mEq/L (ref 135–145)

## 2013-01-11 LAB — CBC
HCT: 28.3 % — ABNORMAL LOW (ref 36.0–46.0)
MCH: 22 pg — ABNORMAL LOW (ref 26.0–34.0)
MCV: 73.1 fL — ABNORMAL LOW (ref 78.0–100.0)
RBC: 3.87 MIL/uL (ref 3.87–5.11)
WBC: 5.6 10*3/uL (ref 4.0–10.5)

## 2013-01-11 LAB — AMYLASE: Amylase: 109 U/L — ABNORMAL HIGH (ref 0–105)

## 2013-01-12 ENCOUNTER — Other Ambulatory Visit (HOSPITAL_COMMUNITY): Payer: Medicare Other

## 2013-02-25 ENCOUNTER — Other Ambulatory Visit (HOSPITAL_COMMUNITY): Payer: Self-pay | Admitting: Cardiology

## 2013-02-25 DIAGNOSIS — R079 Chest pain, unspecified: Secondary | ICD-10-CM

## 2013-03-03 DIAGNOSIS — K56609 Unspecified intestinal obstruction, unspecified as to partial versus complete obstruction: Secondary | ICD-10-CM

## 2013-03-03 HISTORY — DX: Unspecified intestinal obstruction, unspecified as to partial versus complete obstruction: K56.609

## 2013-03-04 ENCOUNTER — Ambulatory Visit (HOSPITAL_COMMUNITY)
Admission: RE | Admit: 2013-03-04 | Discharge: 2013-03-04 | Disposition: A | Discharge status: Home / Self Care | Payer: Medicare Other | Source: Ambulatory Visit | Attending: Cardiology | Admitting: Cardiology

## 2013-03-04 ENCOUNTER — Encounter (HOSPITAL_COMMUNITY)
Admission: RE | Admit: 2013-03-04 | Discharge: 2013-03-04 | Disposition: A | Discharge status: Home / Self Care | Payer: Medicare Other | Source: Ambulatory Visit | Attending: Cardiology | Admitting: Cardiology

## 2013-03-04 ENCOUNTER — Other Ambulatory Visit: Payer: Self-pay

## 2013-03-04 DIAGNOSIS — R079 Chest pain, unspecified: Secondary | ICD-10-CM

## 2013-03-04 MED ORDER — REGADENOSON 0.4 MG/5ML IV SOLN: 0.4000 mg | Freq: Once | INTRAVENOUS | Status: DC

## 2013-03-04 MED ORDER — TECHNETIUM TC 99M SESTAMIBI - CARDIOLITE
30.0000 | Freq: Once | INTRAVENOUS | Status: AC | PRN
  Administered 2013-03-04: 30 via INTRAVENOUS

## 2013-03-04 MED ORDER — REGADENOSON 0.4 MG/5ML IV SOLN
INTRAVENOUS | Status: AC
  Administered 2013-03-04: 0.4 mg
  Filled 2013-03-04: qty 5

## 2013-03-04 MED ORDER — TECHNETIUM TC 99M SESTAMIBI GENERIC - CARDIOLITE
10.0000 | Freq: Once | INTRAVENOUS | Status: AC | PRN
  Administered 2013-03-04: 10 via INTRAVENOUS

## 2013-03-10 ENCOUNTER — Other Ambulatory Visit (HOSPITAL_COMMUNITY): Payer: Self-pay | Admitting: Pain Medicine

## 2013-03-10 DIAGNOSIS — R109 Unspecified abdominal pain: Secondary | ICD-10-CM

## 2013-03-14 ENCOUNTER — Ambulatory Visit (HOSPITAL_COMMUNITY)
Admission: RE | Admit: 2013-03-14 | Discharge: 2013-03-14 | Disposition: A | Discharge status: Home / Self Care | Payer: Medicare Other | Source: Ambulatory Visit | Attending: Pain Medicine | Admitting: Pain Medicine

## 2013-03-14 ENCOUNTER — Encounter (HOSPITAL_COMMUNITY): Payer: Self-pay

## 2013-03-14 DIAGNOSIS — K449 Diaphragmatic hernia without obstruction or gangrene: Secondary | ICD-10-CM | POA: Insufficient documentation

## 2013-03-14 DIAGNOSIS — R109 Unspecified abdominal pain: Secondary | ICD-10-CM | POA: Insufficient documentation

## 2013-08-31 ENCOUNTER — Other Ambulatory Visit (HOSPITAL_COMMUNITY): Payer: Self-pay | Admitting: Pain Medicine

## 2013-08-31 ENCOUNTER — Ambulatory Visit (HOSPITAL_COMMUNITY)
Admission: RE | Admit: 2013-08-31 | Discharge: 2013-08-31 | Disposition: A | Discharge status: Home / Self Care | Payer: Medicare Other | Source: Ambulatory Visit | Attending: Cardiology | Admitting: Cardiology

## 2013-08-31 ENCOUNTER — Other Ambulatory Visit (HOSPITAL_COMMUNITY): Payer: Self-pay | Admitting: Cardiology

## 2013-08-31 ENCOUNTER — Ambulatory Visit (HOSPITAL_COMMUNITY)
Admission: RE | Admit: 2013-08-31 | Discharge: 2013-08-31 | Disposition: A | Discharge status: Home / Self Care | Payer: Medicare Other | Source: Ambulatory Visit | Attending: Pain Medicine | Admitting: Pain Medicine

## 2013-08-31 DIAGNOSIS — M79609 Pain in unspecified limb: Secondary | ICD-10-CM | POA: Insufficient documentation

## 2013-08-31 DIAGNOSIS — M549 Dorsalgia, unspecified: Secondary | ICD-10-CM

## 2013-08-31 DIAGNOSIS — M545 Low back pain, unspecified: Secondary | ICD-10-CM | POA: Diagnosis present

## 2013-08-31 DIAGNOSIS — M7989 Other specified soft tissue disorders: Secondary | ICD-10-CM | POA: Insufficient documentation

## 2013-08-31 DIAGNOSIS — M25559 Pain in unspecified hip: Secondary | ICD-10-CM | POA: Diagnosis not present

## 2013-08-31 NOTE — Progress Notes (Signed)
VASCULAR LAB PRELIMINARY  PRELIMINARY  PRELIMINARY  PRELIMINARY  Bilateral lower extremity venous duplex completed.    Preliminary report:  Bilateral:  No evidence of DVT, superficial thrombosis, or Baker's Cyst.   Arie Powell, RVS 08/31/2013, 6:01 PM

## 2013-09-10 ENCOUNTER — Observation Stay (HOSPITAL_COMMUNITY)
Admission: EM | Admit: 2013-09-10 | Discharge: 2013-09-12 | Disposition: A | Discharge status: Home / Self Care | Payer: Medicare Other | Attending: Cardiovascular Disease | Admitting: Cardiovascular Disease

## 2013-09-10 ENCOUNTER — Encounter (HOSPITAL_COMMUNITY): Payer: Self-pay | Admitting: Emergency Medicine

## 2013-09-10 DIAGNOSIS — R1310 Dysphagia, unspecified: Secondary | ICD-10-CM | POA: Insufficient documentation

## 2013-09-10 DIAGNOSIS — Z794 Long term (current) use of insulin: Secondary | ICD-10-CM | POA: Insufficient documentation

## 2013-09-10 DIAGNOSIS — E119 Type 2 diabetes mellitus without complications: Secondary | ICD-10-CM | POA: Diagnosis not present

## 2013-09-10 DIAGNOSIS — T781XXA Other adverse food reactions, not elsewhere classified, initial encounter: Principal | ICD-10-CM | POA: Insufficient documentation

## 2013-09-10 DIAGNOSIS — I872 Venous insufficiency (chronic) (peripheral): Secondary | ICD-10-CM | POA: Insufficient documentation

## 2013-09-10 DIAGNOSIS — Z7982 Long term (current) use of aspirin: Secondary | ICD-10-CM | POA: Insufficient documentation

## 2013-09-10 DIAGNOSIS — R221 Localized swelling, mass and lump, neck: Secondary | ICD-10-CM

## 2013-09-10 DIAGNOSIS — E7800 Pure hypercholesterolemia, unspecified: Secondary | ICD-10-CM | POA: Insufficient documentation

## 2013-09-10 DIAGNOSIS — E669 Obesity, unspecified: Secondary | ICD-10-CM

## 2013-09-10 DIAGNOSIS — R4789 Other speech disturbances: Secondary | ICD-10-CM | POA: Diagnosis not present

## 2013-09-10 DIAGNOSIS — Z91013 Allergy to seafood: Secondary | ICD-10-CM | POA: Diagnosis not present

## 2013-09-10 DIAGNOSIS — D649 Anemia, unspecified: Secondary | ICD-10-CM | POA: Diagnosis not present

## 2013-09-10 DIAGNOSIS — T783XXA Angioneurotic edema, initial encounter: Secondary | ICD-10-CM

## 2013-09-10 DIAGNOSIS — Z79899 Other long term (current) drug therapy: Secondary | ICD-10-CM | POA: Diagnosis not present

## 2013-09-10 DIAGNOSIS — I6529 Occlusion and stenosis of unspecified carotid artery: Secondary | ICD-10-CM | POA: Diagnosis not present

## 2013-09-10 DIAGNOSIS — Z87891 Personal history of nicotine dependence: Secondary | ICD-10-CM | POA: Diagnosis not present

## 2013-09-10 DIAGNOSIS — R220 Localized swelling, mass and lump, head: Secondary | ICD-10-CM | POA: Insufficient documentation

## 2013-09-10 DIAGNOSIS — I10 Essential (primary) hypertension: Secondary | ICD-10-CM | POA: Insufficient documentation

## 2013-09-10 DIAGNOSIS — M1990 Unspecified osteoarthritis, unspecified site: Secondary | ICD-10-CM | POA: Insufficient documentation

## 2013-09-10 DIAGNOSIS — Z8673 Personal history of transient ischemic attack (TIA), and cerebral infarction without residual deficits: Secondary | ICD-10-CM | POA: Insufficient documentation

## 2013-09-10 NOTE — ED Provider Notes (Addendum)
CSN: [id number]     Arrival date & time 09/10/13  2312 History   First MD Initiated Contact with Patient 09/10/13 2321     Chief Complaint  Patient presents with  . 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 She ate three shrimp around 4pm and then noticed diffuse itching and urticaria. She took 50mg  PO Benadryl. This improved her itching. But, she then developed a sensation of tightening in her throat and tongue swelling. She says this is still present and has neither improved nor worsened. The patient received 50 mg of IV Benadryl on route EMS.  Patient says she feels a tightness in her throat when she attempts to swallow. She is not having any shortness of breath. The patient notes that she is dysarthric at baseline from a remote stroke. However, she states that she is more dysarthric than her baseline. Unfortunately, she has not accompanied by family or friends who can help me interpret acute from chronic.  Past Medical History  Diagnosis Date  . Bloody discharge from nipple   . Hypertension   . Stroke   . Anemia     mild  . Diabetes mellitus without complication     type 2  . Carotid artery stenosis   . Full dentures   . Wears glasses    Past Surgical History  Procedure Laterality Date  . Cholecystectomy    . Appendectomy    . Eye surgery      right-glaucoma  . Abdominal hysterectomy    . Cataract extraction      left  . Dilation and curettage of uterus    . Tubal ligation    . Breast surgery      bx rt and lt   . Breast ductal system excision  03/02/2012    Procedure: EXCISION DUCTAL SYSTEM BREAST;  Surgeon: Adin Hector, MD;  Location: [place];  Service: General;  Laterality: Left;  . Angiodysplasia    . Dvt    . Nausea & vomiting    . Rectal bleeding    . Abdominal pain, rlq    . Constipation    .  Esophagogastroduodenoscopy N/A 09/10/2012    Procedure: ESOPHAGOGASTRODUODENOSCOPY (EGD);  Surgeon: Beryle Beams, MD;  Location: [facility];  Service: Endoscopy;  Laterality: N/A;  . Hot hemostasis N/A 09/10/2012    Procedure: HOT HEMOSTASIS (ARGON PLASMA COAGULATION/BICAP);  Surgeon: Beryle Beams, MD;  Location: [facility];  Service: Endoscopy;  Laterality: N/A;  . Colonoscopy N/A 10/15/2012    Procedure: COLONOSCOPY;  Surgeon: Beryle Beams, MD;  Location: [facility];  Service: Endoscopy;  Laterality: N/A;   No family history on file. History  Substance Use Topics  . Smoking status: Former Smoker    Quit date: 02/25/2002  . Smokeless tobacco: Never Used  . Alcohol Use: No   OB History   Grav Para Term Preterm Abortions TAB SAB Ect Mult Living                 Review of Systems  Ten point review of symptoms performed and is negative with the exception of symptoms noted above.   Allergies  Aspirin; Blue dyes (parenteral); Contrast media; Ibuprofen; Iohexol; and Shellfish-derived products  Home Medications   Prior to Admission medications   Medication Sig Start Date End Date Taking? Authorizing Provider  amLODipine-valsartan (EXFORGE) 5-160 MG per tablet Take 1 tablet by  mouth daily.    Historical Provider, MD  atorvastatin (LIPITOR) 40 MG tablet Take 40 mg by mouth daily.    Historical Provider, MD  bimatoprost (LUMIGAN) 0.01 % SOLN Place 1 drop into both eyes at bedtime.    Historical Provider, MD  brimonidine (ALPHAGAN P) 0.1 % SOLN Place 1 drop into both eyes every 8 (eight) hours.     Historical Provider, MD  colchicine 0.6 MG tablet Take 0.6 mg by mouth daily.    Historical Provider, MD  dorzolamide (TRUSOPT) 2 % ophthalmic solution Place 1 drop into both eyes 2 (two) times daily.    Historical Provider, MD  febuxostat (ULORIC) 40 MG tablet Take 40 mg by mouth 2 (two) times daily.    Historical Provider, MD  insulin detemir (LEVEMIR) 100 UNIT/ML injection Inject 30  Units into the skin 3 (three) times daily.     Historical Provider, MD  loperamide (IMODIUM) 2 MG capsule Take 2 mg by mouth 4 (four) times daily as needed. For diarrhea    Historical Provider, MD  LORazepam (ATIVAN) 0.5 MG tablet Take 0.5 mg by mouth every 8 (eight) hours.    Historical Provider, MD  omeprazole (PRILOSEC) 40 MG capsule Take 40 mg by mouth 2 (two) times daily.    Historical Provider, MD  pilocarpine (PILOCAR) 4 % ophthalmic solution Place 1 drop into both eyes 2 (two) times daily.    Historical Provider, MD  pioglitazone (ACTOS) 15 MG tablet Take 15 mg by mouth daily.    Historical Provider, MD  torsemide (DEMADEX) 20 MG tablet Take 20 mg by mouth daily.    Historical Provider, MD  traMADol (ULTRAM) 50 MG tablet Take 50 mg by mouth every 6 (six) hours as needed. For pain    Historical Provider, MD  triamcinolone cream (KENALOG) 0.1 % Apply 1 application topically 2 (two) times daily.    Historical Provider, MD  zolpidem (AMBIEN) 5 MG tablet Take 5 mg by mouth at bedtime as needed for sleep.     Historical Provider, MD   There were no vitals taken for this visit. Physical Exam Gen: well developed and well nourished appearing Head: NCAT Eyes: PERL, EOMI Nose: no epistaixis or rhinorrhea Mouth/throat: mucosa is moist and pink, tongue is mildly and symmetrically edematous, however, i am unable to visualize the soft palate or uvula Neck: supple, no stridor Lungs: CTA B, no wheezing, rhonchi or rales CV: RRR, no murmur, extremities appear well perfused.  Abd: soft, notender, nondistended Back: no ttp, no cva ttp Skin: warm and dry Ext: normal to inspection, no dependent edema Neuro: CN ii-xii grossly intact, no focal deficits Psyche; normal affect,  calm and cooperative.  ED Course  Procedures (including critical care time) Labs Review   MDM   Patient with allergic andioedema. No acute airway compromise or concern for imminent compromise. We will ask the [facility]  to observe.     Elyn Peers, MD 09/11/13 5638  0147: Case discussed with Dr. Doylene Canard who will see and evaluate the patient for observation admission.   Elyn Peers, MD 09/11/13 [phone]

## 2013-09-10 NOTE — ED Notes (Signed)
Patient transported by [facility] from [facility] (assisted living).  Patient states she is allergic to shellfish and ate 3 shrimp around 1600 today.  Patient started to having rash around chest, neck and mouth.  Patient took 50 mg benadryl PO.  Patient called EMS due to not having desired improvement and was given 50 mg Benadryl IV.

## 2013-09-11 DIAGNOSIS — T783XXA Angioneurotic edema, initial encounter: Secondary | ICD-10-CM

## 2013-09-11 DIAGNOSIS — T781XXA Other adverse food reactions, not elsewhere classified, initial encounter: Secondary | ICD-10-CM | POA: Diagnosis not present

## 2013-09-11 LAB — GLUCOSE, CAPILLARY
GLUCOSE-CAPILLARY: 230 mg/dL — AB (ref 70–99)
Glucose-Capillary: 366 mg/dL — ABNORMAL HIGH (ref 70–99)
Glucose-Capillary: 258 mg/dL — ABNORMAL HIGH (ref 70–99)
Glucose-Capillary: 260 mg/dL — ABNORMAL HIGH (ref 70–99)
Glucose-Capillary: 230 mg/dL — ABNORMAL HIGH (ref 70–99)

## 2013-09-11 LAB — BASIC METABOLIC PANEL
ANION GAP: 16 — AB (ref 5–15)
BUN: 18 mg/dL (ref 6–23)
CALCIUM: 9.3 mg/dL (ref 8.4–10.5)
CHLORIDE: 101 meq/L (ref 96–112)
CO2: 20 mEq/L (ref 19–32)
Creatinine, Ser: 0.71 mg/dL (ref 0.50–1.10)
GFR calc non Af Amer: 79 mL/min — ABNORMAL LOW (ref >90)
Glucose, Bld: 270 mg/dL — ABNORMAL HIGH (ref 70–99)
Potassium: 4.6 mEq/L (ref 3.7–5.3)
Sodium: 137 mEq/L (ref 137–147)

## 2013-09-11 LAB — CBC
HCT: 33 % — ABNORMAL LOW (ref 36.0–46.0)
HEMOGLOBIN: 10.2 g/dL — AB (ref 12.0–15.0)
MCH: 23.4 pg — ABNORMAL LOW (ref 26.0–34.0)
MCHC: 30.9 g/dL (ref 30.0–36.0)
MCV: 75.9 fL — ABNORMAL LOW (ref 78.0–100.0)
PLATELETS: 291 10*3/uL (ref 150–400)
RBC: 4.35 MIL/uL (ref 3.87–5.11)
RDW: 18.9 % — ABNORMAL HIGH (ref 11.5–15.5)
WBC: 7.9 10*3/uL (ref 4.0–10.5)

## 2013-09-11 LAB — MRSA PCR SCREENING: MRSA BY PCR: NEGATIVE

## 2013-09-11 LAB — HEMOGLOBIN A1C
HEMOGLOBIN A1C: 11 % — AB (ref <5.7)
MEAN PLASMA GLUCOSE: 269 mg/dL — AB (ref <117)

## 2013-09-11 MED ORDER — PILOCARPINE HCL 4 % OP SOLN
1.0000 [drp] | Freq: Two times a day (BID) | OPHTHALMIC | Status: DC
  Administered 2013-09-11 – 2013-09-12 (×3): 1 [drp] via OPHTHALMIC
  Filled 2013-09-11: qty 15

## 2013-09-11 MED ORDER — BRIMONIDINE TARTRATE 0.2 % OP SOLN
1.0000 [drp] | Freq: Three times a day (TID) | OPHTHALMIC | Status: DC
  Administered 2013-09-11 – 2013-09-12 (×4): 1 [drp] via OPHTHALMIC
  Filled 2013-09-11: qty 5

## 2013-09-11 MED ORDER — INSULIN ASPART 100 UNIT/ML ~~LOC~~ SOLN
0.0000 [IU] | Freq: Three times a day (TID) | SUBCUTANEOUS | Status: DC
  Administered 2013-09-11: 15 [IU] via SUBCUTANEOUS
  Administered 2013-09-11 (×2): 8 [IU] via SUBCUTANEOUS
  Administered 2013-09-12 (×2): 5 [IU] via SUBCUTANEOUS

## 2013-09-11 MED ORDER — DORZOLAMIDE HCL 2 % OP SOLN
1.0000 [drp] | Freq: Two times a day (BID) | OPHTHALMIC | Status: DC
  Administered 2013-09-11 – 2013-09-12 (×3): 1 [drp] via OPHTHALMIC
  Filled 2013-09-11: qty 10

## 2013-09-11 MED ORDER — FENTANYL 12 MCG/HR TD PT72
12.5000 ug | MEDICATED_PATCH | TRANSDERMAL | Status: DC
  Administered 2013-09-11: 12.5 ug via TRANSDERMAL
  Filled 2013-09-11: qty 1

## 2013-09-11 MED ORDER — METHYLPREDNISOLONE SODIUM SUCC 125 MG IJ SOLR
125.0000 mg | Freq: Every day | INTRAMUSCULAR | Status: AC
  Administered 2013-09-11 – 2013-09-12 (×2): 125 mg via INTRAVENOUS
  Filled 2013-09-11 (×2): qty 2

## 2013-09-11 MED ORDER — HEPARIN SODIUM (PORCINE) 5000 UNIT/ML IJ SOLN
5000.0000 [IU] | Freq: Three times a day (TID) | INTRAMUSCULAR | Status: DC
  Administered 2013-09-11 – 2013-09-12 (×4): 5000 [IU] via SUBCUTANEOUS
  Filled 2013-09-11 (×8): qty 1

## 2013-09-11 MED ORDER — SODIUM CHLORIDE 0.9 % IJ SOLN
3.0000 mL | Freq: Two times a day (BID) | INTRAMUSCULAR | Status: DC
  Administered 2013-09-11 – 2013-09-12 (×4): 3 mL via INTRAVENOUS

## 2013-09-11 MED ORDER — METHYLPREDNISOLONE SODIUM SUCC 125 MG IJ SOLR
125.0000 mg | Freq: Once | INTRAMUSCULAR | Status: AC
  Administered 2013-09-11: 125 mg via INTRAVENOUS
  Filled 2013-09-11: qty 2

## 2013-09-11 MED ORDER — ACETAMINOPHEN 325 MG PO TABS
650.0000 mg | ORAL_TABLET | Freq: Four times a day (QID) | ORAL | Status: DC | PRN
  Filled 2013-09-11: qty 2

## 2013-09-11 MED ORDER — FAMOTIDINE IN NACL 20-0.9 MG/50ML-% IV SOLN
20.0000 mg | Freq: Two times a day (BID) | INTRAVENOUS | Status: DC
  Administered 2013-09-11 – 2013-09-12 (×3): 20 mg via INTRAVENOUS
  Filled 2013-09-11 (×4): qty 50

## 2013-09-11 MED ORDER — TRAMADOL HCL 50 MG PO TABS
50.0000 mg | ORAL_TABLET | Freq: Two times a day (BID) | ORAL | Status: DC | PRN
  Administered 2013-09-11 – 2013-09-12 (×2): 50 mg via ORAL
  Filled 2013-09-11 (×2): qty 1

## 2013-09-11 MED ORDER — LATANOPROST 0.005 % OP SOLN
1.0000 [drp] | Freq: Every day | OPHTHALMIC | Status: DC
  Administered 2013-09-11: 1 [drp] via OPHTHALMIC
  Filled 2013-09-11: qty 2.5

## 2013-09-11 MED ORDER — FAMOTIDINE IN NACL 20-0.9 MG/50ML-% IV SOLN
20.0000 mg | Freq: Once | INTRAVENOUS | Status: AC
  Administered 2013-09-11: 20 mg via INTRAVENOUS
  Filled 2013-09-11: qty 50

## 2013-09-11 MED ORDER — ALBUTEROL SULFATE (2.5 MG/3ML) 0.083% IN NEBU: 2.5000 mg | INHALATION_SOLUTION | Freq: Four times a day (QID) | RESPIRATORY_TRACT | Status: DC | PRN

## 2013-09-11 MED ORDER — ALBUTEROL SULFATE (2.5 MG/3ML) 0.083% IN NEBU
2.5000 mg | INHALATION_SOLUTION | Freq: Four times a day (QID) | RESPIRATORY_TRACT | Status: DC
  Administered 2013-09-11: 2.5 mg via RESPIRATORY_TRACT
  Filled 2013-09-11: qty 3

## 2013-09-11 MED ORDER — HYDROCODONE-ACETAMINOPHEN 5-325 MG PO TABS
1.0000 | ORAL_TABLET | Freq: Four times a day (QID) | ORAL | Status: DC | PRN
  Administered 2013-09-11: 1 via ORAL
  Filled 2013-09-11: qty 1

## 2013-09-11 NOTE — Progress Notes (Signed)
On call physician notified that pt is wearing fentanyl patch from home - no order for fentanyl patch.

## 2013-09-11 NOTE — H&P (Signed)
Referring Physician: Wallene Huh  Katherine [name] is an 80 y.o. female.                       Chief Complaint: Difficulty swallowing post shrimp intake, a known allergic food  HPI: This patient is an elderly woman with a shrimp allergy. She ate three shrimps around 4pm and then noticed diffuse itching and urticaria. She took 50mg  PO Benadryl. This improved her itching. But, she then developed a sensation of tightening in her throat and tongue swelling. She says this is still present and has neither improved nor worsened. The patient received 50 mg of IV Benadryl on route by EMS.   Past Medical History  Diagnosis Date  . Bloody discharge from nipple   . Hypertension   . Stroke   . Anemia     mild  . Diabetes mellitus without complication     type 2  . Carotid artery stenosis   . Full dentures   . Wears glasses       Past Surgical History  Procedure Laterality Date  . Cholecystectomy    . Appendectomy    . Eye surgery      right-glaucoma  . Abdominal hysterectomy    . Cataract extraction      left  . Dilation and curettage of uterus    . Tubal ligation    . Breast surgery      bx rt and lt   . Breast ductal system excision  03/02/2012    Procedure: EXCISION DUCTAL SYSTEM BREAST;  Surgeon: Adin Hector, MD;  Location: [facility];  Service: General;  Laterality: Left;  . Angiodysplasia    . Dvt    . Nausea & vomiting    . Rectal bleeding    . Abdominal pain, rlq    . Constipation    . Esophagogastroduodenoscopy N/A 09/10/2012    Procedure: ESOPHAGOGASTRODUODENOSCOPY (EGD);  Surgeon: Beryle Beams, MD;  Location: [facility];  Service: Endoscopy;  Laterality: N/A;  . Hot hemostasis N/A 09/10/2012    Procedure: HOT HEMOSTASIS (ARGON PLASMA COAGULATION/BICAP);  Surgeon: Beryle Beams, MD;  Location: [facility];  Service: Endoscopy;  Laterality: N/A;  . Colonoscopy N/A 10/15/2012    Procedure: COLONOSCOPY;  Surgeon: Beryle Beams, MD;  Location: [facility];  Service: Endoscopy;  Laterality: N/A;    No family history on file. Social History:  reports that she quit smoking about 11 years ago. She has never used smokeless tobacco. She reports that she does not drink alcohol or use illicit drugs.  Allergies:  Allergies  Allergen Reactions  . Aspirin Hives  . Blue Dyes (Parenteral) Hives  . Contrast Media [Iodinated Diagnostic Agents] Other (See Comments)    unknown  . Ibuprofen Hives  . Iohexol Other (See Comments)    unknown  . Shellfish-Derived Products Hives     (Not in a hospital admission)  No results found for this or any previous visit (from the past 48 hour(s)). No results found.  Review Of Systems Constitutional: Negative for fever, chills and unexpected weight change.  HENT: Negative for hearing loss, now has congestion, sore throat, trouble swallowing and voice change.  Eyes: Negative for visual disturbance.  Respiratory: Negative for cough and wheezing.  Cardiovascular: Negative for chest pain, palpitations and leg swelling.  Gastrointestinal: Negative for nausea, vomiting, abdominal pain, diarrhea, constipation, blood in stool, abdominal distention and anal bleeding.  Genitourinary: Negative for hematuria, vaginal bleeding  and difficulty urinating.  Musculoskeletal: Negative for arthralgias.  Skin: Negative for rash and wound.  Neurological: Negative for seizures, syncope and headaches. +ve stroke Hematological: Negative for adenopathy. Does not bruise/bleed easily.  Psychiatric/Behavioral: Negative for confusion.    Blood pressure 113/83, pulse 60, temperature 98.9 F (37.2 C), temperature source Oral, resp. rate 18, height 5\' 7"  (1.702 m), weight 83.915 kg (185 lb), SpO2 95.00%.  HENT: Head: Normocephalic and atraumatic.  Nose: Nose normal.  Mouth/Throat: No oropharyngeal exudate. Edentulous. Tongue enlarged.  Eyes: Conjunctivae normal and EOM are normal. Pupils are equal, round, and reactive to light.  No scleral icterus.  Neck: Neck supple. No JVD present. No tracheal deviation present. No thyromegaly present.  Scar left neck from carotid endarterectomy  Cardiovascular: Normal rate, regular rhythm, normal heart sounds and intact distal pulses. II/VI systolic murmur heard.  Pulmonary/Chest: Effort normal and breath sounds normal. No respiratory distress. She has no wheezes. She has no rales. She exhibits no tenderness.  Abdominal: Soft. Bowel sounds are normal. She exhibits no distension and no mass. There is no tenderness. Multiple scars.  Musculoskeletal: She exhibits no edema and no tenderness.  Lymphadenopathy: She has no cervical adenopathy.  Neurological: She is alert and oriented to person, place, and time. She exhibits normal muscle tone. Coordination normal. Thick speech. Skin: Skin is warm. No rash noted. She is not diaphoretic. No erythema. No pallor.  Psychiatric: She has a normal mood and affect. Her behavior is normal.   Assessment/Plan Allergic angioedema.  Breathing treatments. Clear liquids.  PO home medications post ease of swallowing  Pal Shell S, MD  09/11/2013, 2:25 AM

## 2013-09-11 NOTE — Progress Notes (Addendum)
Notified Dr. Doylene Canard that pt states she has a terrible headache. Pt will not take the tylenol ordered because she says she has stomach issues and unable to take tylenol. MD ordered Ultram 50mg  po bidprn for pain. Will continue to monitor pt. Hassan Buckler

## 2013-09-11 NOTE — Progress Notes (Signed)
Pt arrive to unit in no s/s of distress. Pt A&Ox4. Pt oriented to unit and room. Whiteboard updated. Callbell within reach. VS stable. Tele applied. Skin c/d/i - bruising BLE. Report received from ED nurse prior to pt's arrival. Will continue to monitor.

## 2013-09-11 NOTE — Progress Notes (Signed)
Utilization Review Completed.Katherine [name] T7/01/2014  

## 2013-09-12 DIAGNOSIS — T781XXA Other adverse food reactions, not elsewhere classified, initial encounter: Secondary | ICD-10-CM | POA: Diagnosis not present

## 2013-09-12 LAB — GLUCOSE, CAPILLARY
GLUCOSE-CAPILLARY: 217 mg/dL — AB (ref 70–99)
Glucose-Capillary: 237 mg/dL — ABNORMAL HIGH (ref 70–99)

## 2013-09-12 NOTE — Progress Notes (Signed)
Patient was discharged home by MD order; discharged instructions  review and give to patient with care notes ; IV DIC; skin intact; patient will be transported to her house via EMS.

## 2013-09-12 NOTE — Discharge Summary (Signed)
Katherine [name], Katherine [name]                 ACCOUNT NO.:  [id number]  MEDICAL RECORD NO.:  [id number]  LOCATION:  5W35C                        FACILITY:  [facility]  PHYSICIAN:  Leobardo Granlund N. Terrence Dupont, M.D. DATE OF BIRTH:  28-Apr-1930  DATE OF ADMISSION:  09/10/2013 DATE OF DISCHARGE:  09/12/2013                              DISCHARGE SUMMARY   ADMITTING DIAGNOSES:  Angioedema secondary to shrimps, hypertension, coronary artery disease, hypercholesteremia, diabetes mellitus, history of cerebrovascular accident.  FINAL DIAGNOSES: 1. Status post angioedema secondary to allergy to shrimps, shellfish,     seafood, etc. 2. Coronary artery disease, stable. 3. Hypertension. 4. Non-insulin-dependent diabetes mellitus. 5. Hypercholesterolemia. 6. History of cerebrovascular accident. 7. Chronic venous insufficiency. 8. Anemia. 9. Osteoarthritis.  DISCHARGE HOME MEDICATIONS:  Atorvastatin 40 mg daily; ferrous sulfate 325 mg 1 tablet 3 times daily; Humalog insulin 75/25, 15 units in the morning and 30 units at night as before; lorazepam 0.5 mg every 8 hours as needed; omeprazole 40 mg twice daily as before; furosemide 20 mg daily; KCl 20 mEq daily; Uloric 80 mg daily; Diovan 320 mg daily; Ambien 5 mg daily as needed; fentanyl patch 12.5 mcg 1 patch every 72 hours; eyedrops as before.  DIET:  Low salt, low cholesterol, 1800 calories ADA diet.  The patient has been advised to monitor blood pressure and blood sugar daily.  The patient has been advised to stay away from seafoods, fish, shrimps as before.  FOLLOWUP:  Follow up with me and Dr. Montez Morita as scheduled.  CONDITION AT DISCHARGE:  Stable.  BRIEF HISTORY AND HOSPITAL COURSE:  Katherine [name] is an 80 year old female with past medical history significant for multiple medical problems, i.e., coronary artery disease, hypertension, diabetes mellitus, hypercholesteremia, history of CVA, chronic venous insufficiency, history of anemia, osteoarthritis, chronic  leg swelling, secondary to calcium channel blockers and Actos in the past, she was admitted yesterday by Dr. Doylene Canard because of diffuse itching, urticaria followed by tightness in the throat and tongue swelling after eating shrimp.  The patient states she has been eating shrimp but used to take Benadryl before eating shrimp, yesterday did not take any Benadryl, ate shrimp and had developed tongue swelling and itching.  The patient came to the ER by EMS, received Benadryl with improvement in her symptoms, but continued to have tongue swelling and some slurred speech.  The patient denies any weakness in the arms or legs.  Denies any shortness of breath.  Denies any chest pain, tightness, heaviness.  The patient was admitted over 24 hours, has remained hemodynamically stable.  Her tongue swelling has gradually improved.  PHYSICAL EXAMINATION:  GENERAL:  She was alert, awake, oriented x3, in no acute distress. VITAL SIGNS:  Blood pressure was 113/83, pulse was 60, she was afebrile. HEENT:  Mouth, no oropharyngeal exudate. Tongue was enlarged. Conjunctivae was pink. NECK:  Supple.  No JVD.  No bruit. LUNGS:  Clear to auscultation without rhonchi or rales. CARDIOVASCULAR:  S1, S2 was normal.  There was 2/6 systolic murmur. ABDOMEN:  Soft.  Bowel sounds were present.  Nontender. EXTREMITIES:  There is no clubbing, cyanosis.  There was no pitting edema.  LABORATORY DATA:  Sodium was 137, potassium  was 4.6, BUN was 18, creatinine 0.71, glucose was 269.  Hemoglobin was 10.2, hematocrit 33, white count of 7.9.  Hemoglobin A1c was 11.  BRIEF HOSPITAL COURSE:  The patient was admitted to telemetry unit and was monitored for 24 hours.  The patient did not had any problem breathing, her tongue swelling has gradually improved.  The patient's itching and rash has been completely resolved.  The patient will be discharged home on above medications and will be followed up in my office in 1 week and  Dr. Montez Morita as scheduled.  The patient has been advised to monitor blood sugar twice daily and chart.  We will adjust the dose of insulin as outpatient.  The patient also is advised to stay away from seafoods, not to take any seafoods despite using it in the past taking Benadryl.  The patient has been advised to refrain from eating seafood completely.  To which she agrees.  The patient was discharged home in stable condition.     Allegra Lai. Terrence Dupont, M.D.     MNH/MEDQ  D:  09/12/2013  T:  09/12/2013  Job:  [id number]

## 2013-09-12 NOTE — Discharge Instructions (Signed)
Angioedema  Angioedema is a sudden swelling of tissues, often of the skin. It can occur on the face or genitals or in the abdomen or other body parts. The swelling usually develops over a short period and gets better in 24 to 48 hours. It often begins during the night and is found when the person wakes up. The person may also get red, itchy patches of skin (hives). Angioedema can be dangerous if it involves swelling of the air passages.   Depending on the cause, episodes of angioedema may only happen once, come back in unpredictable patterns, or repeat for several years and then gradually fade away.   CAUSES   Angioedema can be caused by an allergic reaction to various triggers. It can also result from nonallergic causes, including reactions to drugs, immune system disorders, viral infections, or an abnormal gene that is passed to you from your parents (hereditary). For some people with angioedema, the cause is unknown.   Some things that can trigger angioedema include:   · Foods.    · Medicines, such as ACE inhibitors, ARBs, nonsteroidal anti-inflammatory agents, or estrogen.    · Latex.    · Animal saliva.    · Insect stings.    · Dyes used in X-rays.    · Mild injury.    · Dental work.  · Surgery.  · Stress.    · Sudden changes in temperature.    · Exercise.  SIGNS AND SYMPTOMS   · Swelling of the skin.  · Hives. If these are present, there is also intense itching.  · Redness in the affected area.    · Pain in the affected area.  · Swollen lips or tongue.  · Breathing problems. This may happen if the air passages swell.  · Wheezing.  If internal organs are involved, there may be:   · Nausea.    · Abdominal pain.    · Vomiting.    · Difficulty swallowing.    · Difficulty passing urine.  DIAGNOSIS   · Your health care provider will examine the affected area and take a medical and family history.  · Various tests may be done to help determine the cause. Tests may include:  ¨ Allergy skin tests to see if the problem  is an allergic reaction.    ¨ Blood tests to check for hereditary angioedema.    ¨ Tests to check for underlying diseases that could cause the condition.    · A review of your medicines, including over the counter medicines, may be done.  TREATMENT   Treatment will depend on the cause of the angioedema. Possible treatments include:   · Removal of anything that triggered the condition (such as stopping certain medicines).    · Medicines to treat symptoms or prevent attacks. Medicines given may include:    ¨ Antihistamines.    ¨ Epinephrine injection.    ¨ Steroids.    · Hospitalization may be required for severe attacks. If the air passages are affected, it can be an emergency. Tubes may need to be placed to keep the airway open.  HOME CARE INSTRUCTIONS   · Only take over-the-counter or prescription medicines as directed by your health care provider.  · If you were given medicines for emergency allergy treatment, always carry them with you.  · Wear a medical bracelet as directed by your health care provider.    · Avoid known triggers.  SEEK MEDICAL CARE IF:   · You have repeat attacks of angioedema.    · Your attacks are more frequent or more severe despite preventive measures.    · You have   hereditary angioedema and are considering having children. It is important to discuss the risks of passing the condition on to your children with your health care provider.  SEEK IMMEDIATE MEDICAL CARE IF:   · You have severe swelling of the mouth, tongue, or lips.  · You have difficulty breathing.    · You have difficulty swallowing.    · You faint.  MAKE SURE YOU:  · Understand these instructions.  · Will watch your condition.  · Will get help right away if you are not doing well or get worse.  Document Released: 04/28/2001 Document Revised: 12/08/2012 Document Reviewed: 10/11/2012  Exit[employer]® Patient Information ©2015 [employer]. This information is not intended to replace advice given to you by your health care provider.  Make sure you discuss any questions you have with your health care provider.

## 2013-09-12 NOTE — Care Management Note (Signed)
    Page 1 of 1   09/12/2013     11:12:57 AM CARE MANAGEMENT NOTE 09/12/2013  Patient:  Katherine [name], Katherine [name]   Account Number:  [id number]  Date Initiated:  09/12/2013  Documentation initiated by:  Tomi Bamberger  Subjective/Objective Assessment:   dx angioedema  admit as observation- from [facility]. ALF     Action/Plan:   Anticipated DC Date:  09/12/2013   Anticipated DC Plan:  ASSISTED LIVING / REST HOME  In-house referral  Clinical Social Worker      DC Planning Services  CM consult      Choice offered to / List presented to:             Status of service:  Completed, signed off Medicare Important Message given?   (If response is "NO", the following Medicare IM given date fields will be blank) Date Medicare IM given:   Medicare IM given by:   Date Additional Medicare IM given:   Additional Medicare IM given by:    Discharge Disposition:  ASSISTED LIVING  Per UR Regulation:  Reviewed for med. necessity/level of care/duration of stay  If discussed at [place] of Stay Meetings, dates discussed:    Comments:  09/12/13 [place], BSN [phone] patient for dc back to ALF today will need ambulance transport, [place] notified.

## 2013-09-12 NOTE — Clinical Social Work Note (Signed)
Patient requested EMS transport home. CSW requested transport and placed dc packet on chart. CSW signing off at this time.   Liz Beach MSW, [place], [place], [phone]

## 2013-09-12 NOTE — Discharge Summary (Signed)
  Discharge summary dictated on 09/12/2013 dictation number is [phone]

## 2013-10-15 ENCOUNTER — Encounter (HOSPITAL_COMMUNITY): Payer: Self-pay | Admitting: Emergency Medicine

## 2013-10-15 ENCOUNTER — Emergency Department (HOSPITAL_COMMUNITY): Payer: Medicare Other

## 2013-10-15 ENCOUNTER — Inpatient Hospital Stay (HOSPITAL_COMMUNITY)
Admission: EM | Admit: 2013-10-15 | Discharge: 2013-10-22 | DRG: 390 | Disposition: A | Discharge status: Home / Self Care | Payer: Medicare Other | Attending: Cardiovascular Disease | Admitting: Cardiovascular Disease

## 2013-10-15 DIAGNOSIS — D649 Anemia, unspecified: Secondary | ICD-10-CM | POA: Diagnosis present

## 2013-10-15 DIAGNOSIS — I2510 Atherosclerotic heart disease of native coronary artery without angina pectoris: Secondary | ICD-10-CM | POA: Diagnosis present

## 2013-10-15 DIAGNOSIS — Z6832 Body mass index (BMI) 32.0-32.9, adult: Secondary | ICD-10-CM | POA: Diagnosis not present

## 2013-10-15 DIAGNOSIS — Z86718 Personal history of other venous thrombosis and embolism: Secondary | ICD-10-CM

## 2013-10-15 DIAGNOSIS — R0602 Shortness of breath: Secondary | ICD-10-CM | POA: Diagnosis present

## 2013-10-15 DIAGNOSIS — Z87891 Personal history of nicotine dependence: Secondary | ICD-10-CM

## 2013-10-15 DIAGNOSIS — Z91041 Radiographic dye allergy status: Secondary | ICD-10-CM | POA: Diagnosis not present

## 2013-10-15 DIAGNOSIS — K56609 Unspecified intestinal obstruction, unspecified as to partial versus complete obstruction: Secondary | ICD-10-CM

## 2013-10-15 DIAGNOSIS — Z794 Long term (current) use of insulin: Secondary | ICD-10-CM | POA: Diagnosis not present

## 2013-10-15 DIAGNOSIS — M1990 Unspecified osteoarthritis, unspecified site: Secondary | ICD-10-CM | POA: Diagnosis present

## 2013-10-15 DIAGNOSIS — I10 Essential (primary) hypertension: Secondary | ICD-10-CM | POA: Diagnosis present

## 2013-10-15 DIAGNOSIS — K560 Paralytic ileus: Principal | ICD-10-CM | POA: Diagnosis present

## 2013-10-15 DIAGNOSIS — Z8673 Personal history of transient ischemic attack (TIA), and cerebral infarction without residual deficits: Secondary | ICD-10-CM

## 2013-10-15 DIAGNOSIS — E119 Type 2 diabetes mellitus without complications: Secondary | ICD-10-CM | POA: Diagnosis present

## 2013-10-15 DIAGNOSIS — E669 Obesity, unspecified: Secondary | ICD-10-CM | POA: Diagnosis present

## 2013-10-15 LAB — COMPREHENSIVE METABOLIC PANEL
ALBUMIN: 3.8 g/dL (ref 3.5–5.2)
ALK PHOS: 86 U/L (ref 39–117)
ALT: 16 U/L (ref 0–35)
ANION GAP: 14 (ref 5–15)
AST: 26 U/L (ref 0–37)
BUN: 11 mg/dL (ref 6–23)
CALCIUM: 9.9 mg/dL (ref 8.4–10.5)
CO2: 24 mEq/L (ref 19–32)
Chloride: 106 mEq/L (ref 96–112)
Creatinine, Ser: 0.76 mg/dL (ref 0.50–1.10)
GFR calc Af Amer: 90 mL/min — ABNORMAL LOW (ref >90)
GFR calc non Af Amer: 78 mL/min — ABNORMAL LOW (ref >90)
Glucose, Bld: 134 mg/dL — ABNORMAL HIGH (ref 70–99)
POTASSIUM: 3.6 meq/L — AB (ref 3.7–5.3)
SODIUM: 144 meq/L (ref 137–147)
Total Bilirubin: 0.4 mg/dL (ref 0.3–1.2)
Total Protein: 7.9 g/dL (ref 6.0–8.3)

## 2013-10-15 LAB — URINE MICROSCOPIC-ADD ON

## 2013-10-15 LAB — URINALYSIS, ROUTINE W REFLEX MICROSCOPIC
Bilirubin Urine: NEGATIVE
Glucose, UA: NEGATIVE mg/dL
Ketones, ur: NEGATIVE mg/dL
LEUKOCYTES UA: NEGATIVE
NITRITE: NEGATIVE
PH: 5 (ref 5.0–8.0)
Specific Gravity, Urine: 1.021 (ref 1.005–1.030)
Urobilinogen, UA: 1 mg/dL (ref 0.0–1.0)

## 2013-10-15 LAB — I-STAT TROPONIN, ED: TROPONIN I, POC: 0 ng/mL (ref 0.00–0.08)

## 2013-10-15 LAB — CBC WITH DIFFERENTIAL/PLATELET
BASOS PCT: 0 % (ref 0–1)
Basophils Absolute: 0 10*3/uL (ref 0.0–0.1)
EOS ABS: 0 10*3/uL (ref 0.0–0.7)
EOS PCT: 0 % (ref 0–5)
HCT: 36.4 % (ref 36.0–46.0)
Hemoglobin: 11.5 g/dL — ABNORMAL LOW (ref 12.0–15.0)
Lymphocytes Relative: 12 % (ref 12–46)
Lymphs Abs: 1 10*3/uL (ref 0.7–4.0)
MCH: 24.5 pg — AB (ref 26.0–34.0)
MCHC: 31.6 g/dL (ref 30.0–36.0)
MCV: 77.6 fL — AB (ref 78.0–100.0)
Monocytes Absolute: 0.4 10*3/uL (ref 0.1–1.0)
Monocytes Relative: 4 % (ref 3–12)
NEUTROS PCT: 84 % — AB (ref 43–77)
Neutro Abs: 7.6 10*3/uL (ref 1.7–7.7)
PLATELETS: 289 10*3/uL (ref 150–400)
RBC: 4.69 MIL/uL (ref 3.87–5.11)
RDW: 19.1 % — ABNORMAL HIGH (ref 11.5–15.5)
WBC: 9 10*3/uL (ref 4.0–10.5)

## 2013-10-15 LAB — GLUCOSE, CAPILLARY
Glucose-Capillary: 166 mg/dL — ABNORMAL HIGH (ref 70–99)
Glucose-Capillary: 207 mg/dL — ABNORMAL HIGH (ref 70–99)

## 2013-10-15 LAB — HEMOGLOBIN A1C
Hgb A1c MFr Bld: 9.9 % — ABNORMAL HIGH (ref <5.7)
Mean Plasma Glucose: 237 mg/dL — ABNORMAL HIGH (ref <117)

## 2013-10-15 LAB — LIPASE, BLOOD: Lipase: 22 U/L (ref 11–59)

## 2013-10-15 LAB — MRSA PCR SCREENING: MRSA by PCR: NEGATIVE

## 2013-10-15 LAB — I-STAT CG4 LACTIC ACID, ED: Lactic Acid, Venous: 1.93 mmol/L (ref 0.5–2.2)

## 2013-10-15 MED ORDER — PILOCARPINE HCL 4 % OP SOLN
1.0000 [drp] | Freq: Two times a day (BID) | OPHTHALMIC | Status: DC
  Administered 2013-10-15 – 2013-10-22 (×14): 1 [drp] via OPHTHALMIC
  Filled 2013-10-15: qty 15

## 2013-10-15 MED ORDER — HYDROMORPHONE HCL PF 1 MG/ML IJ SOLN
1.0000 mg | Freq: Four times a day (QID) | INTRAMUSCULAR | Status: DC | PRN
  Administered 2013-10-15 – 2013-10-16 (×3): 1 mg via INTRAVENOUS
  Filled 2013-10-15 (×3): qty 1

## 2013-10-15 MED ORDER — INSULIN ASPART 100 UNIT/ML ~~LOC~~ SOLN
0.0000 [IU] | Freq: Three times a day (TID) | SUBCUTANEOUS | Status: DC
  Administered 2013-10-16: 7 [IU] via SUBCUTANEOUS
  Administered 2013-10-16 (×2): 4 [IU] via SUBCUTANEOUS
  Administered 2013-10-17: 3 [IU] via SUBCUTANEOUS
  Administered 2013-10-17 (×2): 4 [IU] via SUBCUTANEOUS
  Administered 2013-10-18: 3 [IU] via SUBCUTANEOUS
  Administered 2013-10-18 (×2): 4 [IU] via SUBCUTANEOUS
  Administered 2013-10-19: 7 [IU] via SUBCUTANEOUS
  Administered 2013-10-19 (×2): 3 [IU] via SUBCUTANEOUS
  Administered 2013-10-20: 4 [IU] via SUBCUTANEOUS
  Administered 2013-10-21: 5 [IU] via SUBCUTANEOUS
  Administered 2013-10-21 (×2): 3 [IU] via SUBCUTANEOUS
  Administered 2013-10-22 (×2): 4 [IU] via SUBCUTANEOUS

## 2013-10-15 MED ORDER — DORZOLAMIDE HCL 2 % OP SOLN
1.0000 [drp] | Freq: Two times a day (BID) | OPHTHALMIC | Status: DC
  Administered 2013-10-15 – 2013-10-22 (×14): 1 [drp] via OPHTHALMIC
  Filled 2013-10-15: qty 10

## 2013-10-15 MED ORDER — ONDANSETRON HCL 4 MG/2ML IJ SOLN
4.0000 mg | Freq: Once | INTRAMUSCULAR | Status: AC
  Administered 2013-10-15: 4 mg via INTRAVENOUS
  Filled 2013-10-15: qty 2

## 2013-10-15 MED ORDER — ONDANSETRON HCL 4 MG/2ML IJ SOLN
4.0000 mg | Freq: Four times a day (QID) | INTRAMUSCULAR | Status: DC | PRN
  Administered 2013-10-15 – 2013-10-16 (×2): 4 mg via INTRAVENOUS
  Filled 2013-10-15 (×3): qty 2

## 2013-10-15 MED ORDER — HYDROMORPHONE HCL PF 1 MG/ML IJ SOLN
1.0000 mg | Freq: Once | INTRAMUSCULAR | Status: AC
  Administered 2013-10-15: 1 mg via INTRAVENOUS
  Filled 2013-10-15: qty 1

## 2013-10-15 MED ORDER — BRIMONIDINE TARTRATE 0.2 % OP SOLN
1.0000 [drp] | Freq: Three times a day (TID) | OPHTHALMIC | Status: DC
  Administered 2013-10-15 – 2013-10-22 (×21): 1 [drp] via OPHTHALMIC
  Filled 2013-10-15: qty 5

## 2013-10-15 MED ORDER — INSULIN ASPART 100 UNIT/ML ~~LOC~~ SOLN: 0.0000 [IU] | Freq: Three times a day (TID) | SUBCUTANEOUS | Status: DC

## 2013-10-15 MED ORDER — DEXTROSE-NACL 5-0.45 % IV SOLN
INTRAVENOUS | Status: DC
  Administered 2013-10-15 – 2013-10-16 (×2): via INTRAVENOUS

## 2013-10-15 MED ORDER — SODIUM CHLORIDE 0.9 % IJ SOLN
3.0000 mL | Freq: Two times a day (BID) | INTRAMUSCULAR | Status: DC
  Administered 2013-10-19 – 2013-10-22 (×4): 3 mL via INTRAVENOUS

## 2013-10-15 MED ORDER — MORPHINE SULFATE 4 MG/ML IJ SOLN
4.0000 mg | Freq: Once | INTRAMUSCULAR | Status: AC
  Administered 2013-10-15: 4 mg via INTRAVENOUS
  Filled 2013-10-15: qty 1

## 2013-10-15 MED ORDER — FENTANYL 12 MCG/HR TD PT72
12.5000 ug | MEDICATED_PATCH | TRANSDERMAL | Status: DC
  Administered 2013-10-15: 12.5 ug via TRANSDERMAL
  Filled 2013-10-15: qty 1

## 2013-10-15 MED ORDER — LATANOPROST 0.005 % OP SOLN
1.0000 [drp] | Freq: Every day | OPHTHALMIC | Status: DC
  Administered 2013-10-15 – 2013-10-20 (×5): 1 [drp] via OPHTHALMIC
  Filled 2013-10-15 (×2): qty 2.5

## 2013-10-15 NOTE — Consult Note (Signed)
Reason for Consult: Partial small bowel obstruction  Referring Physician: Dina Rich, MD  Katherine [name] is an 80 y.o. female.  HPI:  80 yo female with extensive PMH and multiple abdominal surgeries presents with abdominal distention, abdominal pain, nausea, vomiting.  She had a bowel movement earlier today.  She was evaluated in the ED and is noted to have a partial SBO.  She is being admitted by [facility] and we are consulted.    Past Medical History  Diagnosis Date  . Bloody discharge from nipple   . Hypertension   . Stroke   . Anemia     mild  . Diabetes mellitus without complication     type 2  . Carotid artery stenosis   . Full dentures   . Wears glasses     Past Surgical History  Procedure Laterality Date  . Cholecystectomy    . Appendectomy    . Eye surgery      right-glaucoma  . Abdominal hysterectomy    . Cataract extraction      left  . Dilation and curettage of uterus    . Tubal ligation    . Breast surgery      bx rt and lt   . Breast ductal system excision  03/02/2012    Procedure: EXCISION DUCTAL SYSTEM BREAST;  Surgeon: Adin Hector, MD;  Location: [place];  Service: General;  Laterality: Left;  . Angiodysplasia    . Dvt    . Nausea & vomiting    . Rectal bleeding    . Abdominal pain, rlq    . Constipation    . Esophagogastroduodenoscopy N/A 09/10/2012    Procedure: ESOPHAGOGASTRODUODENOSCOPY (EGD);  Surgeon: Beryle Beams, MD;  Location: [facility];  Service: Endoscopy;  Laterality: N/A;  . Hot hemostasis N/A 09/10/2012    Procedure: HOT HEMOSTASIS (ARGON PLASMA COAGULATION/BICAP);  Surgeon: Beryle Beams, MD;  Location: [facility];  Service: Endoscopy;  Laterality: N/A;  . Colonoscopy N/A 10/15/2012    Procedure: COLONOSCOPY;  Surgeon: Beryle Beams, MD;  Location: [facility];  Service: Endoscopy;  Laterality: N/A;    No family history on file.  Social History:  reports that she quit smoking about 11 years ago. She has never used  smokeless tobacco. She reports that she does not drink alcohol or use illicit drugs.  Allergies:  Allergies  Allergen Reactions  . Aspirin Hives  . Blue Dyes (Parenteral) Hives  . Contrast Media [Iodinated Diagnostic Agents] Other (See Comments)    unknown  . Ibuprofen Hives  . Iohexol Other (See Comments)    unknown  . Shellfish-Derived Products Hives    Medications:   Prior to Admission medications   Medication Sig Start Date End Date Taking? Authorizing Provider  atorvastatin (LIPITOR) 40 MG tablet Take 40 mg by mouth daily.   Yes Historical Provider, MD  PRESCRIPTION MEDICATION Take 35-55 Units by mouth 2 (two) times daily. 55units in the morning and 35units in the evening   Yes Historical Provider, MD  bimatoprost (LUMIGAN) 0.01 % SOLN Place 1 drop into both eyes at bedtime.    Historical Provider, MD  brimonidine (ALPHAGAN P) 0.1 % SOLN Place 1 drop into both eyes every 8 (eight) hours.     Historical Provider, MD  dorzolamide (TRUSOPT) 2 % ophthalmic solution Place 1 drop into both eyes 2 (two) times daily.    Historical Provider, MD  Febuxostat (ULORIC) 80 MG TABS Take 80 mg by mouth daily.  Historical Provider, MD  fentaNYL (DURAGESIC - DOSED MCG/HR) 12 MCG/HR Place 12.5 mcg onto the skin every 3 (three) days.    Historical Provider, MD  ferrous sulfate 325 (65 FE) MG tablet Take 325 mg by mouth 3 (three) times daily with meals.    Historical Provider, MD  insulin lispro protamine-lispro (HUMALOG 75/25 MIX) (75-25) 100 UNIT/ML SUSP injection Inject 30-50 Units into the skin 2 (two) times daily. 50 units in the morning and 30 units at night    Historical Provider, MD  LORazepam (ATIVAN) 0.5 MG tablet Take 0.5 mg by mouth every 8 (eight) hours.    Historical Provider, MD  omeprazole (PRILOSEC) 40 MG capsule Take 40 mg by mouth 2 (two) times daily.    Historical Provider, MD  pilocarpine (PILOCAR) 4 % ophthalmic solution Place 1 drop into both eyes 2 (two) times daily.     Historical Provider, MD  torsemide (DEMADEX) 20 MG tablet Take 20 mg by mouth daily.    Historical Provider, MD  triamcinolone cream (KENALOG) 0.1 % Apply 1 application topically 2 (two) times daily.    Historical Provider, MD  valsartan (DIOVAN) 320 MG tablet Take 320 mg by mouth daily.    Historical Provider, MD  zolpidem (AMBIEN) 5 MG tablet Take 5 mg by mouth at bedtime as needed for sleep.     Historical Provider, MD     Results for orders placed during the hospital encounter of 10/15/13 (from the past 48 hour(s))  CBC WITH DIFFERENTIAL     Status: Abnormal   Collection Time    10/15/13  9:02 AM      Result Value Ref Range   WBC 9.0  4.0 - 10.5 K/uL   RBC 4.69  3.87 - 5.11 MIL/uL   Hemoglobin 11.5 (*) 12.0 - 15.0 g/dL   HCT 36.4  36.0 - 46.0 %   MCV 77.6 (*) 78.0 - 100.0 fL   MCH 24.5 (*) 26.0 - 34.0 pg   MCHC 31.6  30.0 - 36.0 g/dL   RDW 19.1 (*) 11.5 - 15.5 %   Platelets 289  150 - 400 K/uL   Neutrophils Relative % 84 (*) 43 - 77 %   Neutro Abs 7.6  1.7 - 7.7 K/uL   Lymphocytes Relative 12  12 - 46 %   Lymphs Abs 1.0  0.7 - 4.0 K/uL   Monocytes Relative 4  3 - 12 %   Monocytes Absolute 0.4  0.1 - 1.0 K/uL   Eosinophils Relative 0  0 - 5 %   Eosinophils Absolute 0.0  0.0 - 0.7 K/uL   Basophils Relative 0  0 - 1 %   Basophils Absolute 0.0  0.0 - 0.1 K/uL  COMPREHENSIVE METABOLIC PANEL     Status: Abnormal   Collection Time    10/15/13  9:02 AM      Result Value Ref Range   Sodium 144  137 - 147 mEq/L   Potassium 3.6 (*) 3.7 - 5.3 mEq/L   Chloride 106  96 - 112 mEq/L   CO2 24  19 - 32 mEq/L   Glucose, Bld 134 (*) 70 - 99 mg/dL   BUN 11  6 - 23 mg/dL   Creatinine, Ser 0.76  0.50 - 1.10 mg/dL   Calcium 9.9  8.4 - 10.5 mg/dL   Total Protein 7.9  6.0 - 8.3 g/dL   Albumin 3.8  3.5 - 5.2 g/dL   AST 26  0 - 37 U/L  ALT 16  0 - 35 U/L   Alkaline Phosphatase 86  39 - 117 U/L   Total Bilirubin 0.4  0.3 - 1.2 mg/dL   GFR calc non Af Amer 78 (*) >90 mL/min   GFR calc Af  Amer 90 (*) >90 mL/min   Comment: (NOTE)     The eGFR has been calculated using the CKD EPI equation.     This calculation has not been validated in all clinical situations.     eGFR's persistently <90 mL/min signify possible Chronic Kidney     Disease.   Anion gap 14  5 - 15  LIPASE, BLOOD     Status: None   Collection Time    10/15/13  9:02 AM      Result Value Ref Range   Lipase 22  11 - 59 U/L  I-STAT TROPOININ, ED     Status: None   Collection Time    10/15/13  9:19 AM      Result Value Ref Range   Troponin i, poc 0.00  0.00 - 0.08 ng/mL   Comment 3            Comment: Due to the release kinetics of cTnI,     a negative result within the first hours     of the onset of symptoms does not rule out     myocardial infarction with certainty.     If myocardial infarction is still suspected,     repeat the test at appropriate intervals.  I-STAT CG4 LACTIC ACID, ED     Status: None   Collection Time    10/15/13  9:29 AM      Result Value Ref Range   Lactic Acid, Venous 1.93  0.5 - 2.2 mmol/L  URINALYSIS, ROUTINE W REFLEX MICROSCOPIC     Status: Abnormal   Collection Time    10/15/13 11:04 AM      Result Value Ref Range   Color, Urine YELLOW  YELLOW   APPearance CLEAR  CLEAR   Specific Gravity, Urine 1.021  1.005 - 1.030   pH 5.0  5.0 - 8.0   Glucose, UA NEGATIVE  NEGATIVE mg/dL   Hgb urine dipstick TRACE (*) NEGATIVE   Bilirubin Urine NEGATIVE  NEGATIVE   Ketones, ur NEGATIVE  NEGATIVE mg/dL   Protein, ur >300 (*) NEGATIVE mg/dL   Urobilinogen, UA 1.0  0.0 - 1.0 mg/dL   Nitrite NEGATIVE  NEGATIVE   Leukocytes, UA NEGATIVE  NEGATIVE  URINE MICROSCOPIC-ADD ON     Status: Abnormal   Collection Time    10/15/13 11:04 AM      Result Value Ref Range   Squamous Epithelial / LPF FEW (*) RARE   WBC, UA 0-2  <3 WBC/hpf   RBC / HPF 0-2  <3 RBC/hpf   Bacteria, UA FEW (*) RARE   Casts GRANULAR CAST (*) NEGATIVE   Urine-Other MUCOUS PRESENT      Ct Abdomen Pelvis Wo  Contrast  10/15/2013   CLINICAL DATA:  Nausea and vomiting and epigastric pain, low back pain  EXAM: CT ABDOMEN AND PELVIS WITHOUT CONTRAST  TECHNIQUE: Multidetector CT imaging of the abdomen and pelvis was performed following the standard protocol without IV contrast.  COMPARISON:  CT 03/14/2013  FINDINGS: There is linear atelectasis the lung bases. No pleural fluid. No pericardial fluid.  Non IV contrast images demonstrate no focal hepatic lesion. Small amount of fluid along the hepatic margin. Postcholecystectomy. The pancreas, spleen, adrenal glands,  and kidneys are normal.  The stomach, duodenum are normal. There is minimal progression of the oral contrast in the small bowel. There is fluid-filled loops of small bowel which are at the upper limits of normal in caliber measuring 2.53 cm. The most distal small bowel is small in caliber measuring 5 x 15 mm. The colon is predominantly collapsed. These findings are suggestive of partial small bowel obstruction. Transition . is not readily identified. There is no ventral hernia or inguinal hernia.  Abdominal aorta is normal caliber. There is a IVC filter noted. No free fluid in the pelvis. Post hysterectomy anatomy. No pelvic lymphadenopathy. No aggressive osseous lesion.  IMPRESSION: 1. Fluid filled loops of proximal small bowel with decompressed loops of distal small bowel and colon. Little progression of the oral contrast. Findings suggestive of a partial mechanical obstruction. No transition point is identified. 2. Small amount of free fluid along the margin of the liver.   Electronically Signed   By: Suzy Bouchard M.D.   On: 10/15/2013 13:05   Dg Abd Acute W/chest  10/15/2013   CLINICAL DATA:  Abdominal pain.  EXAM: ACUTE ABDOMEN SERIES (ABDOMEN 2 VIEW & CHEST 1 VIEW)  COMPARISON:  Chest radiograph, 08/06/2010  FINDINGS: Normal bowel gas pattern. No evidence of obstruction or generalized adynamic ileus. No free air.  There has been a cholecystectomy.  Multiple calcifications project on each side of the pelvis consistent with injection granuloma.  Lungs are clear. Cardiac silhouette is normal in size. Aorta is uncoiled.  IMPRESSION: No acute findings in the abdomen or pelvis. No obstruction or free air.  No acute cardiopulmonary disease.   Electronically Signed   By: Lajean Manes M.D.   On: 10/15/2013 10:26    Review of Systems  Constitutional: Negative for weight loss.  HENT: Negative for ear discharge, ear pain, hearing loss and tinnitus.   Eyes: Negative for blurred vision, double vision, photophobia and pain.  Respiratory: Negative for cough, sputum production and shortness of breath.   Cardiovascular: Negative for chest pain.  Gastrointestinal: Negative for nausea, vomiting and abdominal pain.  Genitourinary: Negative for dysuria, urgency, frequency and flank pain.  Musculoskeletal: Negative for back pain, falls, joint pain, myalgias and neck pain.  Neurological: Negative for dizziness, tingling, sensory change, focal weakness, loss of consciousness and headaches.  Endo/Heme/Allergies: Does not bruise/bleed easily.  Psychiatric/Behavioral: Negative for depression, memory loss and substance abuse. The patient is not nervous/anxious.    Blood pressure 173/79, pulse 78, temperature 97.7 F (36.5 C), temperature source Oral, resp. rate 19, SpO2 98.00%. Physical Exam Elderly WDWN in NAD HEENT:  EOMI, sclera anicteric:  NG tube in place with moderate output Neck:  No masses, no thyromegaly Lungs:  CTA bilaterally; normal respiratory effort CV:  Regular rate and rhythm; no murmurs Abd:  Distended, moderately tender; no peritoneal signs Ext:  Well-perfused; no edema Skin:  Warm, dry; no sign of jaundice  Assessment/Plan: Partial small bowel obstruction  NG tube IV hydration Bowel rest  Serial abdominal films    Adalea Handler K. 10/15/2013, 3:12 PM

## 2013-10-15 NOTE — H&P (Signed)
Referring Physician: Wallene Huh, MD  Katherine [name] is an 80 y.o. female.                       Chief Complaint: Abdominal pain and vomiting  HPI: 80 year old female with known history of recurrent bowel obstruction has significant cramping type epigastric pain this AM. Ct scan in ER showed small bowel obstruction with small amount of free fluid along the margin of the liver. No fever or rectal bleed.  Past Medical History  Diagnosis Date  . Bloody discharge from nipple   . Hypertension   . Stroke   . Anemia     mild  . Diabetes mellitus without complication     type 2  . Carotid artery stenosis   . Full dentures   . Wears glasses       Past Surgical History  Procedure Laterality Date  . Cholecystectomy    . Appendectomy    . Eye surgery      right-glaucoma  . Abdominal hysterectomy    . Cataract extraction      left  . Dilation and curettage of uterus    . Tubal ligation    . Breast surgery      bx rt and lt   . Breast ductal system excision  03/02/2012    Procedure: EXCISION DUCTAL SYSTEM BREAST;  Surgeon: Adin Hector, MD;  Location: [place];  Service: General;  Laterality: Left;  . Angiodysplasia    . Dvt    . Nausea & vomiting    . Rectal bleeding    . Abdominal pain, rlq    . Constipation    . Esophagogastroduodenoscopy N/A 09/10/2012    Procedure: ESOPHAGOGASTRODUODENOSCOPY (EGD);  Surgeon: Beryle Beams, MD;  Location: [facility];  Service: Endoscopy;  Laterality: N/A;  . Hot hemostasis N/A 09/10/2012    Procedure: HOT HEMOSTASIS (ARGON PLASMA COAGULATION/BICAP);  Surgeon: Beryle Beams, MD;  Location: [facility];  Service: Endoscopy;  Laterality: N/A;  . Colonoscopy N/A 10/15/2012    Procedure: COLONOSCOPY;  Surgeon: Beryle Beams, MD;  Location: [facility];  Service: Endoscopy;  Laterality: N/A;    No family history on file. Social History:  reports that she quit smoking about 11 years ago. She has never used smokeless  tobacco. She reports that she does not drink alcohol or use illicit drugs.  Allergies:  Allergies  Allergen Reactions  . Aspirin Hives  . Blue Dyes (Parenteral) Hives  . Contrast Media [Iodinated Diagnostic Agents] Other (See Comments)    unknown  . Ibuprofen Hives  . Iohexol Other (See Comments)    unknown  . Shellfish-Derived Products Hives     (Not in a hospital admission)  Results for orders placed during the hospital encounter of 10/15/13 (from the past 48 hour(s))  CBC WITH DIFFERENTIAL     Status: Abnormal   Collection Time    10/15/13  9:02 AM      Result Value Ref Range   WBC 9.0  4.0 - 10.5 K/uL   RBC 4.69  3.87 - 5.11 MIL/uL   Hemoglobin 11.5 (*) 12.0 - 15.0 g/dL   HCT 36.4  36.0 - 46.0 %   MCV 77.6 (*) 78.0 - 100.0 fL   MCH 24.5 (*) 26.0 - 34.0 pg   MCHC 31.6  30.0 - 36.0 g/dL   RDW 19.1 (*) 11.5 - 15.5 %   Platelets 289  150 - 400  K/uL   Neutrophils Relative % 84 (*) 43 - 77 %   Neutro Abs 7.6  1.7 - 7.7 K/uL   Lymphocytes Relative 12  12 - 46 %   Lymphs Abs 1.0  0.7 - 4.0 K/uL   Monocytes Relative 4  3 - 12 %   Monocytes Absolute 0.4  0.1 - 1.0 K/uL   Eosinophils Relative 0  0 - 5 %   Eosinophils Absolute 0.0  0.0 - 0.7 K/uL   Basophils Relative 0  0 - 1 %   Basophils Absolute 0.0  0.0 - 0.1 K/uL  COMPREHENSIVE METABOLIC PANEL     Status: Abnormal   Collection Time    10/15/13  9:02 AM      Result Value Ref Range   Sodium 144  137 - 147 mEq/L   Potassium 3.6 (*) 3.7 - 5.3 mEq/L   Chloride 106  96 - 112 mEq/L   CO2 24  19 - 32 mEq/L   Glucose, Bld 134 (*) 70 - 99 mg/dL   BUN 11  6 - 23 mg/dL   Creatinine, Ser 0.76  0.50 - 1.10 mg/dL   Calcium 9.9  8.4 - 10.5 mg/dL   Total Protein 7.9  6.0 - 8.3 g/dL   Albumin 3.8  3.5 - 5.2 g/dL   AST 26  0 - 37 U/L   ALT 16  0 - 35 U/L   Alkaline Phosphatase 86  39 - 117 U/L   Total Bilirubin 0.4  0.3 - 1.2 mg/dL   GFR calc non Af Amer 78 (*) >90 mL/min   GFR calc Af Amer 90 (*) >90 mL/min   Comment: (NOTE)      The eGFR has been calculated using the CKD EPI equation.     This calculation has not been validated in all clinical situations.     eGFR's persistently <90 mL/min signify possible Chronic Kidney     Disease.   Anion gap 14  5 - 15  LIPASE, BLOOD     Status: None   Collection Time    10/15/13  9:02 AM      Result Value Ref Range   Lipase 22  11 - 59 U/L  I-STAT TROPOININ, ED     Status: None   Collection Time    10/15/13  9:19 AM      Result Value Ref Range   Troponin i, poc 0.00  0.00 - 0.08 ng/mL   Comment 3            Comment: Due to the release kinetics of cTnI,     a negative result within the first hours     of the onset of symptoms does not rule out     myocardial infarction with certainty.     If myocardial infarction is still suspected,     repeat the test at appropriate intervals.  I-STAT CG4 LACTIC ACID, ED     Status: None   Collection Time    10/15/13  9:29 AM      Result Value Ref Range   Lactic Acid, Venous 1.93  0.5 - 2.2 mmol/L  URINALYSIS, ROUTINE W REFLEX MICROSCOPIC     Status: Abnormal   Collection Time    10/15/13 11:04 AM      Result Value Ref Range   Color, Urine YELLOW  YELLOW   APPearance CLEAR  CLEAR   Specific Gravity, Urine 1.021  1.005 - 1.030   pH 5.0  5.0 -  8.0   Glucose, UA NEGATIVE  NEGATIVE mg/dL   Hgb urine dipstick TRACE (*) NEGATIVE   Bilirubin Urine NEGATIVE  NEGATIVE   Ketones, ur NEGATIVE  NEGATIVE mg/dL   Protein, ur >300 (*) NEGATIVE mg/dL   Urobilinogen, UA 1.0  0.0 - 1.0 mg/dL   Nitrite NEGATIVE  NEGATIVE   Leukocytes, UA NEGATIVE  NEGATIVE  URINE MICROSCOPIC-ADD ON     Status: Abnormal   Collection Time    10/15/13 11:04 AM      Result Value Ref Range   Squamous Epithelial / LPF FEW (*) RARE   WBC, UA 0-2  <3 WBC/hpf   RBC / HPF 0-2  <3 RBC/hpf   Bacteria, UA FEW (*) RARE   Casts GRANULAR CAST (*) NEGATIVE   Urine-Other MUCOUS PRESENT     Ct Abdomen Pelvis Wo Contrast  10/15/2013   CLINICAL DATA:  Nausea and  vomiting and epigastric pain, low back pain  EXAM: CT ABDOMEN AND PELVIS WITHOUT CONTRAST  TECHNIQUE: Multidetector CT imaging of the abdomen and pelvis was performed following the standard protocol without IV contrast.  COMPARISON:  CT 03/14/2013  FINDINGS: There is linear atelectasis the lung bases. No pleural fluid. No pericardial fluid.  Non IV contrast images demonstrate no focal hepatic lesion. Small amount of fluid along the hepatic margin. Postcholecystectomy. The pancreas, spleen, adrenal glands, and kidneys are normal.  The stomach, duodenum are normal. There is minimal progression of the oral contrast in the small bowel. There is fluid-filled loops of small bowel which are at the upper limits of normal in caliber measuring 2.53 cm. The most distal small bowel is small in caliber measuring 5 x 15 mm. The colon is predominantly collapsed. These findings are suggestive of partial small bowel obstruction. Transition . is not readily identified. There is no ventral hernia or inguinal hernia.  Abdominal aorta is normal caliber. There is a IVC filter noted. No free fluid in the pelvis. Post hysterectomy anatomy. No pelvic lymphadenopathy. No aggressive osseous lesion.  IMPRESSION: 1. Fluid filled loops of proximal small bowel with decompressed loops of distal small bowel and colon. Little progression of the oral contrast. Findings suggestive of a partial mechanical obstruction. No transition point is identified. 2. Small amount of free fluid along the margin of the liver.   Electronically Signed   By: Suzy Bouchard M.D.   On: 10/15/2013 13:05   Dg Abd Acute W/chest  10/15/2013   CLINICAL DATA:  Abdominal pain.  EXAM: ACUTE ABDOMEN SERIES (ABDOMEN 2 VIEW & CHEST 1 VIEW)  COMPARISON:  Chest radiograph, 08/06/2010  FINDINGS: Normal bowel gas pattern. No evidence of obstruction or generalized adynamic ileus. No free air.  There has been a cholecystectomy. Multiple calcifications project on each side of the  pelvis consistent with injection granuloma.  Lungs are clear. Cardiac silhouette is normal in size. Aorta is uncoiled.  IMPRESSION: No acute findings in the abdomen or pelvis. No obstruction or free air.  No acute cardiopulmonary disease.   Electronically Signed   By: Lajean Manes M.D.   On: 10/15/2013 10:26    Review Of Systems Constitutional: Negative for weight loss.  HENT: Negative for ear discharge, ear pain, hearing loss and tinnitus.  Eyes: Negative for blurred vision, double vision, photophobia and pain.  Respiratory: Negative for cough, sputum production and shortness of breath.  Cardiovascular: Negative for chest pain.  Gastrointestinal: Positive for nausea, vomiting and abdominal pain.  Genitourinary: Negative for dysuria, urgency, frequency and flank pain.  Musculoskeletal: Negative for back pain, falls, joint pain, myalgias and neck pain.  Neurological: Negative for dizziness, tingling, sensory change, focal weakness, loss of consciousness and headaches. + stroke Endo/Heme/Allergies: Does not bruise/bleed easily. + DM, II  Psychiatric/Behavioral: Negative for depression, memory loss and substance abuse. The patient is not nervous/anxious.    Blood pressure 173/79, pulse 78, temperature 97.7 F (36.5 C), temperature source Oral, resp. rate 19, SpO2 98.00%. General: Elderly WDWN in NAD  HEENT: EOMI, sclera anicteric: NG tube in place with moderate output  Neck: No masses, no thyromegaly  Lungs: CTA bilaterally; normal respiratory effort  CV: Regular rate and rhythm; II/VI systolic murmurs  Abd: Distended, moderately tender;  Ext: Well-perfused; no edema  Skin: Warm, dry; no sign of jaundice  Assessment/Plan Acute small bowel obstruction Hypertension DM, II Obesity H/O stroke H/O anemia Osteoarthritis CAD  NPO/IV fluids Surgical consult-Dr. Donnie Mesa.  Birdie Riddle, MD  10/15/2013, 3:24 PM

## 2013-10-15 NOTE — ED Notes (Signed)
Per EMS- Pt comes from home where she pressed her life alert for epigastric pain since 0200 this morning. N/v, pain. HR 60, BP 176/70, 137 CBG, 98% RA. Given 4 mg zofran. Pt moaning in pain at current. Is a x 4.

## 2013-10-15 NOTE — ED Notes (Signed)
Admitting MD made aware of BP and reported pain. Reports okay to go to floor. 5W RN made aware.

## 2013-10-15 NOTE — ED Provider Notes (Signed)
CSN: [id number]     Arrival date & time 10/15/13  7829 History   First MD Initiated Contact with Patient 10/15/13 0901     Chief Complaint  Patient presents with  . Abdominal Pain     (Consider location/radiation/quality/duration/timing/severity/associated sxs/prior Treatment) HPI  This is an 80 year old female with a history of hypertension, stroke, anemia, diabetes, coronary artery disease, multiple abdominal surgeries including appendectomy, cholecystectomy, hysterectomy who presents with abdominal pain and vomiting. Patient reports epigastric abdominal pain that radiates to her back. It started yesterday evening. It is sharp in nature. She reports associated nonbilious, nonbloody emesis. Last normal bowel movement was today. She denies any blood in her stools. This current pain is 10/10.  She is moaning in pain.  Past Medical History  Diagnosis Date  . Bloody discharge from nipple   . Hypertension   . Stroke   . Anemia     mild  . Diabetes mellitus without complication     type 2  . Carotid artery stenosis   . Full dentures   . Wears glasses    Past Surgical History  Procedure Laterality Date  . Cholecystectomy    . Appendectomy    . Eye surgery      right-glaucoma  . Abdominal hysterectomy    . Cataract extraction      left  . Dilation and curettage of uterus    . Tubal ligation    . Breast surgery      bx rt and lt   . Breast ductal system excision  03/02/2012    Procedure: EXCISION DUCTAL SYSTEM BREAST;  Surgeon: Adin Hector, MD;  Location: ;  Service: General;  Laterality: Left;  . Angiodysplasia    . Dvt    . Nausea & vomiting    . Rectal bleeding    . Abdominal pain, rlq    . Constipation    . Esophagogastroduodenoscopy N/A 09/10/2012    Procedure: ESOPHAGOGASTRODUODENOSCOPY (EGD);  Surgeon: Beryle Beams, MD;  Location: [facility];  Service: Endoscopy;  Laterality: N/A;  . Hot hemostasis N/A 09/10/2012    Procedure: HOT  HEMOSTASIS (ARGON PLASMA COAGULATION/BICAP);  Surgeon: Beryle Beams, MD;  Location: [facility];  Service: Endoscopy;  Laterality: N/A;  . Colonoscopy N/A 10/15/2012    Procedure: COLONOSCOPY;  Surgeon: Beryle Beams, MD;  Location: [facility];  Service: Endoscopy;  Laterality: N/A;   No family history on file. History  Substance Use Topics  . Smoking status: Former Smoker    Quit date: 02/25/2002  . Smokeless tobacco: Never Used  . Alcohol Use: No   OB History   Grav Para Term Preterm Abortions TAB SAB Ect Mult Living                 Review of Systems  Constitutional: Negative for fever.  Respiratory: Negative for chest tightness and shortness of breath.   Cardiovascular: Negative for chest pain.  Gastrointestinal: Positive for nausea, vomiting and abdominal pain. Negative for diarrhea, constipation and blood in stool.  Genitourinary: Negative for dysuria.  Neurological: Negative for headaches.  Psychiatric/Behavioral: Negative for confusion.  All other systems reviewed and are negative.     Allergies  Aspirin; Blue dyes (parenteral); Contrast media; Ibuprofen; Iohexol; and Shellfish-derived products  Home Medications   Prior to Admission medications   Medication Sig Start Date End Date Taking? Authorizing Provider  atorvastatin (LIPITOR) 40 MG tablet Take 40 mg by mouth daily.   Yes Historical Provider, MD  PRESCRIPTION MEDICATION Take 35-55 Units by mouth 2 (two) times daily. 55units in the morning and 35units in the evening   Yes Historical Provider, MD  bimatoprost (LUMIGAN) 0.01 % SOLN Place 1 drop into both eyes at bedtime.    Historical Provider, MD  brimonidine (ALPHAGAN P) 0.1 % SOLN Place 1 drop into both eyes every 8 (eight) hours.     Historical Provider, MD  dorzolamide (TRUSOPT) 2 % ophthalmic solution Place 1 drop into both eyes 2 (two) times daily.    Historical Provider, MD  Febuxostat (ULORIC) 80 MG TABS Take 80 mg by mouth daily.    Historical  Provider, MD  fentaNYL (DURAGESIC - DOSED MCG/HR) 12 MCG/HR Place 12.5 mcg onto the skin every 3 (three) days.    Historical Provider, MD  ferrous sulfate 325 (65 FE) MG tablet Take 325 mg by mouth 3 (three) times daily with meals.    Historical Provider, MD  insulin lispro protamine-lispro (HUMALOG 75/25 MIX) (75-25) 100 UNIT/ML SUSP injection Inject 30-50 Units into the skin 2 (two) times daily. 50 units in the morning and 30 units at night    Historical Provider, MD  LORazepam (ATIVAN) 0.5 MG tablet Take 0.5 mg by mouth every 8 (eight) hours.    Historical Provider, MD  omeprazole (PRILOSEC) 40 MG capsule Take 40 mg by mouth 2 (two) times daily.    Historical Provider, MD  pilocarpine (PILOCAR) 4 % ophthalmic solution Place 1 drop into both eyes 2 (two) times daily.    Historical Provider, MD  torsemide (DEMADEX) 20 MG tablet Take 20 mg by mouth daily.    Historical Provider, MD  triamcinolone cream (KENALOG) 0.1 % Apply 1 application topically 2 (two) times daily.    Historical Provider, MD  valsartan (DIOVAN) 320 MG tablet Take 320 mg by mouth daily.    Historical Provider, MD  zolpidem (AMBIEN) 5 MG tablet Take 5 mg by mouth at bedtime as needed for sleep.     Historical Provider, MD   BP 169/81  Pulse 73  Temp(Src) 97.7 F (36.5 C) (Oral)  Resp 20  SpO2 99% Physical Exam  Nursing note and vitals reviewed. Constitutional: She is oriented to person, place, and time.  Awake, alert, elderly, moaning in pain  HENT:  Head: Normocephalic and atraumatic.  Cardiovascular: Normal rate, regular rhythm and normal heart sounds.   No murmur heard. Pulmonary/Chest: Effort normal and breath sounds normal. No respiratory distress. She has no wheezes.  Abdominal: Soft. Bowel sounds are normal. There is tenderness. There is no rebound and no guarding.  Musculoskeletal: She exhibits edema.  2+ bilateral lower extremity edema to the midcalf  Neurological: She is alert and oriented to person, place,  and time.  Skin: Skin is warm and dry.  Psychiatric: She has a normal mood and affect.    ED Course  Procedures (including critical care time) Labs Review Labs Reviewed  CBC WITH DIFFERENTIAL - Abnormal; Notable for the following:    Hemoglobin 11.5 (*)    MCV 77.6 (*)    MCH 24.5 (*)    RDW 19.1 (*)    Neutrophils Relative % 84 (*)    All other components within normal limits  COMPREHENSIVE METABOLIC PANEL - Abnormal; Notable for the following:    Potassium 3.6 (*)    Glucose, Bld 134 (*)    GFR calc non Af Amer 78 (*)    GFR calc Af Amer 90 (*)    All other components within normal  limits  URINALYSIS, ROUTINE W REFLEX MICROSCOPIC - Abnormal; Notable for the following:    Hgb urine dipstick TRACE (*)    Protein, ur >300 (*)    All other components within normal limits  URINE MICROSCOPIC-ADD ON - Abnormal; Notable for the following:    Squamous Epithelial / LPF FEW (*)    Bacteria, UA FEW (*)    Casts GRANULAR CAST (*)    All other components within normal limits  HEMOGLOBIN A1C - Abnormal; Notable for the following:    Hemoglobin A1C 9.9 (*)    Mean Plasma Glucose 237 (*)    All other components within normal limits  CBC - Abnormal; Notable for the following:    Hemoglobin 10.8 (*)    HCT 35.3 (*)    MCH 24.2 (*)    RDW 19.3 (*)    All other components within normal limits  GLUCOSE, CAPILLARY - Abnormal; Notable for the following:    Glucose-Capillary 166 (*)    All other components within normal limits  GLUCOSE, CAPILLARY - Abnormal; Notable for the following:    Glucose-Capillary 207 (*)    All other components within normal limits  MRSA PCR SCREENING  LIPASE, BLOOD  BASIC METABOLIC PANEL  I-STAT TROPOININ, ED  I-STAT CG4 LACTIC ACID, ED    Imaging Review Ct Abdomen Pelvis Wo Contrast  10/15/2013   CLINICAL DATA:  Nausea and vomiting and epigastric pain, low back pain  EXAM: CT ABDOMEN AND PELVIS WITHOUT CONTRAST  TECHNIQUE: Multidetector CT imaging of the  abdomen and pelvis was performed following the standard protocol without IV contrast.  COMPARISON:  CT 03/14/2013  FINDINGS: There is linear atelectasis the lung bases. No pleural fluid. No pericardial fluid.  Non IV contrast images demonstrate no focal hepatic lesion. Small amount of fluid along the hepatic margin. Postcholecystectomy. The pancreas, spleen, adrenal glands, and kidneys are normal.  The stomach, duodenum are normal. There is minimal progression of the oral contrast in the small bowel. There is fluid-filled loops of small bowel which are at the upper limits of normal in caliber measuring 2.53 cm. The most distal small bowel is small in caliber measuring 5 x 15 mm. The colon is predominantly collapsed. These findings are suggestive of partial small bowel obstruction. Transition . is not readily identified. There is no ventral hernia or inguinal hernia.  Abdominal aorta is normal caliber. There is a IVC filter noted. No free fluid in the pelvis. Post hysterectomy anatomy. No pelvic lymphadenopathy. No aggressive osseous lesion.  IMPRESSION: 1. Fluid filled loops of proximal small bowel with decompressed loops of distal small bowel and colon. Little progression of the oral contrast. Findings suggestive of a partial mechanical obstruction. No transition point is identified. 2. Small amount of free fluid along the margin of the liver.   Electronically Signed   By: Suzy Bouchard M.D.   On: 10/15/2013 13:05   Dg Abd Acute W/chest  10/15/2013   CLINICAL DATA:  Abdominal pain.  EXAM: ACUTE ABDOMEN SERIES (ABDOMEN 2 VIEW & CHEST 1 VIEW)  COMPARISON:  Chest radiograph, 08/06/2010  FINDINGS: Normal bowel gas pattern. No evidence of obstruction or generalized adynamic ileus. No free air.  There has been a cholecystectomy. Multiple calcifications project on each side of the pelvis consistent with injection granuloma.  Lungs are clear. Cardiac silhouette is normal in size. Aorta is uncoiled.  IMPRESSION: No  acute findings in the abdomen or pelvis. No obstruction or free air.  No acute cardiopulmonary disease.  Electronically Signed   By: Lajean Manes M.D.   On: 10/15/2013 10:26     EKG Interpretation   Date/Time:  Saturday October 15 2013 09:00:44 EDT Ventricular Rate:  61 PR Interval:  159 QRS Duration: 83 QT Interval:  494 QTC Calculation: 498 R Axis:     Text Interpretation:  Sinus rhythm Low voltage, precordial leads LVH with  secondary repolarization abnormality Borderline prolonged QT interval  Confirmed by HORTON  MD, COURTNEY ([id number]) on 10/15/2013 9:58:20 AM      MDM   Final diagnoses:  SBO (small bowel obstruction)    Patient presents with abdominal pain and vomiting. She is moaning on exam but her vital signs are within normal limits. No evidence of peritonitis. Patient was given pain and nausea medication. Initial screening lab work and plain films are reassuring. However, given patient's extensive abdominal surgeries, will obtain CT scan. Patient has a contrast allergy and will only be able to get oral contrast. CT scan shows mechanical obstruction. Patient has had one episode of volume this emesis while in the emergency department. Given this we'll place an NG tube. Surgery was consulted. Will admit to medicine.    Merryl Hacker, MD 10/16/13 [phone]

## 2013-10-15 NOTE — Progress Notes (Signed)
Katherine [name] [id number] Code Status: FULL Admission Data: 11/14/2013 5:23 PM Attending Provider:  Doylene Canard  BZJ:IRCVELF,YBOFBP O, MD Consults/ Treatment Team: Treatment Team:  Md Edison Pace, MD  Katherine [name] is a 80 y.o. female patient admitted from ED awake, alert - oriented  X 3 - no acute distress noted.  VSS - Blood pressure 171/93, pulse 78, temperature 99.3 F (37.4 C), temperature source Oral, resp. rate 20, height 5\' 7"  (1.702 m), weight 92.851 kg (204 lb 11.2 oz), SpO2 97.00%.  MD aware of elevated blood pressure.  IV in place, occlusive dsg intact without redness.  Orientation to room, and floor completed with information packet given to patient/family.  Patient declined safety video at this time.  Admission INP armband ID verified with patient/family, and in place.   SR up x 2, fall assessment complete, with patient and family able to verbalize understanding of risk associated with falls, and verbalized understanding to call nsg before up out of bed.  Call light within reach, patient able to voice, and demonstrate understanding.  Skin, clean-dry- intact without evidence of bruising, or skin tears.   No evidence of skin break down noted on exam.  [name], Niece, Informed RN that patient's brother passed away 2013-11-14 and pt currently unaware. Family waiting to consult family before telling patient.      Will cont to eval and treat per MD orders.  Delman Cheadle, RN November 14, 2013 5:23 PM

## 2013-10-15 NOTE — ED Notes (Signed)
Patient transported to CT 

## 2013-10-15 NOTE — Progress Notes (Signed)
Report received for admission to [phone]

## 2013-10-16 LAB — CBC
HEMATOCRIT: 35.3 % — AB (ref 36.0–46.0)
Hemoglobin: 10.8 g/dL — ABNORMAL LOW (ref 12.0–15.0)
MCH: 24.2 pg — AB (ref 26.0–34.0)
MCHC: 30.6 g/dL (ref 30.0–36.0)
MCV: 79.1 fL (ref 78.0–100.0)
Platelets: 272 10*3/uL (ref 150–400)
RBC: 4.46 MIL/uL (ref 3.87–5.11)
RDW: 19.3 % — AB (ref 11.5–15.5)
WBC: 4.5 10*3/uL (ref 4.0–10.5)

## 2013-10-16 LAB — BASIC METABOLIC PANEL
Anion gap: 12 (ref 5–15)
BUN: 15 mg/dL (ref 6–23)
CALCIUM: 8.9 mg/dL (ref 8.4–10.5)
CO2: 23 meq/L (ref 19–32)
CREATININE: 0.74 mg/dL (ref 0.50–1.10)
Chloride: 105 mEq/L (ref 96–112)
GFR calc Af Amer: >90 mL/min (ref >90)
GFR calc non Af Amer: 78 mL/min — ABNORMAL LOW (ref >90)
Glucose, Bld: 224 mg/dL — ABNORMAL HIGH (ref 70–99)
Potassium: 3.6 mEq/L — ABNORMAL LOW (ref 3.7–5.3)
Sodium: 140 mEq/L (ref 137–147)

## 2013-10-16 LAB — GLUCOSE, CAPILLARY
GLUCOSE-CAPILLARY: 187 mg/dL — AB (ref 70–99)
Glucose-Capillary: 208 mg/dL — ABNORMAL HIGH (ref 70–99)
Glucose-Capillary: 172 mg/dL — ABNORMAL HIGH (ref 70–99)
Glucose-Capillary: 160 mg/dL — ABNORMAL HIGH (ref 70–99)

## 2013-10-16 MED ORDER — CHLORHEXIDINE GLUCONATE 0.12 % MT SOLN
15.0000 mL | Freq: Two times a day (BID) | OROMUCOSAL | Status: DC
  Administered 2013-10-16 – 2013-10-19 (×6): 15 mL via OROMUCOSAL
  Filled 2013-10-16 (×11): qty 15

## 2013-10-16 MED ORDER — KCL IN DEXTROSE-NACL 10-5-0.45 MEQ/L-%-% IV SOLN
INTRAVENOUS | Status: DC
  Administered 2013-10-16 – 2013-10-17 (×3): via INTRAVENOUS
  Administered 2013-10-17: 100 mL/h via INTRAVENOUS
  Administered 2013-10-18: 01:00:00 via INTRAVENOUS
  Administered 2013-10-18: 100 mL/h via INTRAVENOUS
  Administered 2013-10-19 – 2013-10-20 (×2): via INTRAVENOUS
  Filled 2013-10-16 (×18): qty 1000

## 2013-10-16 MED ORDER — HEPARIN SODIUM (PORCINE) 5000 UNIT/ML IJ SOLN
5000.0000 [IU] | Freq: Three times a day (TID) | INTRAMUSCULAR | Status: DC
  Administered 2013-10-16 – 2013-10-22 (×17): 5000 [IU] via SUBCUTANEOUS
  Filled 2013-10-16 (×20): qty 1

## 2013-10-16 MED ORDER — HYDROMORPHONE HCL PF 1 MG/ML IJ SOLN
1.0000 mg | INTRAMUSCULAR | Status: DC | PRN
  Administered 2013-10-16 – 2013-10-18 (×8): 1 mg via INTRAVENOUS
  Filled 2013-10-16 (×9): qty 1

## 2013-10-16 MED ORDER — CETYLPYRIDINIUM CHLORIDE 0.05 % MT LIQD
7.0000 mL | Freq: Two times a day (BID) | OROMUCOSAL | Status: DC
  Administered 2013-10-16 – 2013-10-19 (×6): 7 mL via OROMUCOSAL

## 2013-10-16 NOTE — Progress Notes (Signed)
General Surgery Note  LOS: 1 day  POD -    PCP - J. Spruill  Assessment/Plan: 1.  SBO (she has had multiple abdominal operations)  Has NGT.  Still nauseated.  No BM.  Will check KUB and labs tomorrow AM.    2.  History of stoke 3.  DM 4.  IVC filter 5.  Chronic abdominal pain   She was on a fentanyl patch prior to admission.  The patient cannot remember who has her on the patch. 6.  Just hospitalized from 7/12 - 09/12/2013 for allergic angioedema 7.  DVT prophylaxis - will start SQ Heparin   Active Problems:   Bowel obstruction  Subjective:  Still feels distended and with upper abdominal discomfort.  She is a poor historian. Objective:   Filed Vitals:   10/16/13 0429  BP: 136/71  Pulse: 71  Temp: 98.8 F (37.1 C)  Resp: 20     Intake/Output from previous day:  08/15 0701 - 08/16 0700 In: -  Out: 1155 [Urine:250; Emesis/NG output:705]  Intake/Output this shift:      Physical Exam:   General: WN older AA F who is alert    HEENT: Normal. Pupils equal.  Has NGT.  705 cc recorded last 24 hours. .   Lungs: Clear.   Abdomen: Distended with few BS.  Has multiple abdominal scars.  No peritoneal signs.   Lab Results:    Recent Labs  10/15/13 0902 10/16/13 0604  WBC 9.0 4.5  HGB 11.5* 10.8*  HCT 36.4 35.3*  PLT 289 272    BMET   Recent Labs  10/15/13 0902 10/16/13 0604  NA 144 140  K 3.6* 3.6*  CL 106 105  CO2 24 23  GLUCOSE 134* 224*  BUN 11 15  CREATININE 0.76 0.74  CALCIUM 9.9 8.9    PT/INR  No results found for this basename: LABPROT, INR,  in the last 72 hours  ABG  No results found for this basename: PHART, PCO2, PO2, HCO3,  in the last 72 hours   Studies/Results:  Ct Abdomen Pelvis Wo Contrast  10/15/2013   CLINICAL DATA:  Nausea and vomiting and epigastric pain, low back pain  EXAM: CT ABDOMEN AND PELVIS WITHOUT CONTRAST  TECHNIQUE: Multidetector CT imaging of the abdomen and pelvis was performed following the standard protocol without IV  contrast.  COMPARISON:  CT 03/14/2013  FINDINGS: There is linear atelectasis the lung bases. No pleural fluid. No pericardial fluid.  Non IV contrast images demonstrate no focal hepatic lesion. Small amount of fluid along the hepatic margin. Postcholecystectomy. The pancreas, spleen, adrenal glands, and kidneys are normal.  The stomach, duodenum are normal. There is minimal progression of the oral contrast in the small bowel. There is fluid-filled loops of small bowel which are at the upper limits of normal in caliber measuring 2.53 cm. The most distal small bowel is small in caliber measuring 5 x 15 mm. The colon is predominantly collapsed. These findings are suggestive of partial small bowel obstruction. Transition . is not readily identified. There is no ventral hernia or inguinal hernia.  Abdominal aorta is normal caliber. There is a IVC filter noted. No free fluid in the pelvis. Post hysterectomy anatomy. No pelvic lymphadenopathy. No aggressive osseous lesion.  IMPRESSION: 1. Fluid filled loops of proximal small bowel with decompressed loops of distal small bowel and colon. Little progression of the oral contrast. Findings suggestive of a partial mechanical obstruction. No transition point is identified. 2. Small amount of  free fluid along the margin of the liver.   Electronically Signed   By: Suzy Bouchard M.D.   On: 10/15/2013 13:05   Dg Abd Acute W/chest  10/15/2013   CLINICAL DATA:  Abdominal pain.  EXAM: ACUTE ABDOMEN SERIES (ABDOMEN 2 VIEW & CHEST 1 VIEW)  COMPARISON:  Chest radiograph, 08/06/2010  FINDINGS: Normal bowel gas pattern. No evidence of obstruction or generalized adynamic ileus. No free air.  There has been a cholecystectomy. Multiple calcifications project on each side of the pelvis consistent with injection granuloma.  Lungs are clear. Cardiac silhouette is normal in size. Aorta is uncoiled.  IMPRESSION: No acute findings in the abdomen or pelvis. No obstruction or free air.  No acute  cardiopulmonary disease.   Electronically Signed   By: Lajean Manes M.D.   On: 10/15/2013 10:26     Anti-infectives:   Anti-infectives   None      Alphonsa Overall, MD, FACS Pager: [phone] Surgery Office: [phone] 10/16/2013

## 2013-10-17 ENCOUNTER — Inpatient Hospital Stay (HOSPITAL_COMMUNITY): Payer: Medicare Other

## 2013-10-17 LAB — BASIC METABOLIC PANEL
Anion gap: 10 (ref 5–15)
BUN: 12 mg/dL (ref 6–23)
CHLORIDE: 106 meq/L (ref 96–112)
CO2: 23 meq/L (ref 19–32)
Calcium: 8.2 mg/dL — ABNORMAL LOW (ref 8.4–10.5)
Creatinine, Ser: 0.72 mg/dL (ref 0.50–1.10)
GFR calc Af Amer: >90 mL/min (ref >90)
GFR, EST NON AFRICAN AMERICAN: 79 mL/min — AB (ref >90)
GLUCOSE: 179 mg/dL — AB (ref 70–99)
Potassium: 3.4 mEq/L — ABNORMAL LOW (ref 3.7–5.3)
SODIUM: 139 meq/L (ref 137–147)

## 2013-10-17 LAB — CBC WITH DIFFERENTIAL/PLATELET
Basophils Absolute: 0 10*3/uL (ref 0.0–0.1)
Basophils Relative: 1 % (ref 0–1)
Eosinophils Absolute: 0.1 10*3/uL (ref 0.0–0.7)
Eosinophils Relative: 2 % (ref 0–5)
HEMATOCRIT: 31.6 % — AB (ref 36.0–46.0)
Hemoglobin: 9.7 g/dL — ABNORMAL LOW (ref 12.0–15.0)
Lymphocytes Relative: 50 % — ABNORMAL HIGH (ref 12–46)
Lymphs Abs: 2 10*3/uL (ref 0.7–4.0)
MCH: 23.8 pg — ABNORMAL LOW (ref 26.0–34.0)
MCHC: 30.7 g/dL (ref 30.0–36.0)
MCV: 77.6 fL — ABNORMAL LOW (ref 78.0–100.0)
Monocytes Absolute: 0.4 10*3/uL (ref 0.1–1.0)
Monocytes Relative: 10 % (ref 3–12)
Neutro Abs: 1.4 10*3/uL — ABNORMAL LOW (ref 1.7–7.7)
Neutrophils Relative %: 37 % — ABNORMAL LOW (ref 43–77)
PLATELETS: 238 10*3/uL (ref 150–400)
RBC: 4.07 MIL/uL (ref 3.87–5.11)
RDW: 19.2 % — ABNORMAL HIGH (ref 11.5–15.5)
WBC: 3.9 10*3/uL — AB (ref 4.0–10.5)

## 2013-10-17 LAB — GLUCOSE, CAPILLARY
Glucose-Capillary: 192 mg/dL — ABNORMAL HIGH (ref 70–99)
Glucose-Capillary: 156 mg/dL — ABNORMAL HIGH (ref 70–99)
Glucose-Capillary: 138 mg/dL — ABNORMAL HIGH (ref 70–99)
Glucose-Capillary: 127 mg/dL — ABNORMAL HIGH (ref 70–99)

## 2013-10-17 MED ORDER — NITROGLYCERIN 2 % TD OINT
0.5000 [in_us] | TOPICAL_OINTMENT | Freq: Four times a day (QID) | TRANSDERMAL | Status: DC
  Administered 2013-10-17 – 2013-10-18 (×5): 0.5 [in_us] via TOPICAL
  Filled 2013-10-17: qty 30

## 2013-10-17 NOTE — Progress Notes (Signed)
Subjective: She is nauseated, and the Sump on her NG is crimped, I fixed it and let the nurse know.  About 400 from the NG, I have seen film and NG is in good position.   Objective: Vital signs in last 24 hours: Temp:  [97.9 F (36.6 C)-99.1 F (37.3 C)] 98.8 F (37.1 C) (08/17 0435) Pulse Rate:  [72-73] 73 (08/17 0435) Resp:  [20] 20 (08/17 0435) BP: (146-168)/(74-80) 146/74 mmHg (08/17 0435) SpO2:  [93 %-96 %] 93 % (08/17 0435) Weight:  [93.713 kg (206 lb 9.6 oz)] 93.713 kg (206 lb 9.6 oz) (08/17 0435) Last BM Date: 10/16/13 300 from the NG recorded NPO Afebrile, VSS, BP up some K+ 3.4 glucose up WBC is down KUB shows partial decompression of the SB, cholecystectomy and IVC filter. Intake/Output from previous day: 08/16 0701 - 08/17 0700 In: 935.4 [I.V.:885.4; NG/GT:50] Out: 850 [Urine:550; Emesis/NG output:300] Intake/Output this shift: Total I/O In: -  Out: 300 [Urine:300]  General appearance: alert, cooperative and no distress GI: soft, few BS, nauseated, no flatus or BM.  Major scar abdominal wall from prior surgeries.  Lab Results:   Recent Labs  10/16/13 0604 10/17/13 0542  WBC 4.5 3.9*  HGB 10.8* 9.7*  HCT 35.3* 31.6*  PLT 272 238    BMET  Recent Labs  10/16/13 0604 10/17/13 0542  NA 140 139  K 3.6* 3.4*  CL 105 106  CO2 23 23  GLUCOSE 224* 179*  BUN 15 12  CREATININE 0.74 0.72  CALCIUM 8.9 8.2*   PT/INR No results found for this basename: LABPROT, INR,  in the last 72 hours   Recent Labs Lab 10/15/13 0902  AST 26  ALT 16  ALKPHOS 86  BILITOT 0.4  PROT 7.9  ALBUMIN 3.8     Lipase     Component Value Date/Time   LIPASE 22 10/15/2013 0902     Studies/Results: Ct Abdomen Pelvis Wo Contrast  10/15/2013   CLINICAL DATA:  Nausea and vomiting and epigastric pain, low back pain  EXAM: CT ABDOMEN AND PELVIS WITHOUT CONTRAST  TECHNIQUE: Multidetector CT imaging of the abdomen and pelvis was performed following the standard protocol  without IV contrast.  COMPARISON:  CT 03/14/2013  FINDINGS: There is linear atelectasis the lung bases. No pleural fluid. No pericardial fluid.  Non IV contrast images demonstrate no focal hepatic lesion. Small amount of fluid along the hepatic margin. Postcholecystectomy. The pancreas, spleen, adrenal glands, and kidneys are normal.  The stomach, duodenum are normal. There is minimal progression of the oral contrast in the small bowel. There is fluid-filled loops of small bowel which are at the upper limits of normal in caliber measuring 2.53 cm. The most distal small bowel is small in caliber measuring 5 x 15 mm. The colon is predominantly collapsed. These findings are suggestive of partial small bowel obstruction. Transition . is not readily identified. There is no ventral hernia or inguinal hernia.  Abdominal aorta is normal caliber. There is a IVC filter noted. No free fluid in the pelvis. Post hysterectomy anatomy. No pelvic lymphadenopathy. No aggressive osseous lesion.  IMPRESSION: 1. Fluid filled loops of proximal small bowel with decompressed loops of distal small bowel and colon. Little progression of the oral contrast. Findings suggestive of a partial mechanical obstruction. No transition point is identified. 2. Small amount of free fluid along the margin of the liver.   Electronically Signed   By: Suzy Bouchard M.D.   On: 10/15/2013 13:05  Dg Abd 2 Views  10/17/2013   CLINICAL DATA:  Small bowel obstruction.  EXAM: ABDOMEN - 2 VIEW  COMPARISON:  CT 10/15/2013.  FINDINGS: NG tube noted with its tip projected over the stomach. Surgical clips right upper quadrant. IVC filter noted in good anatomic position. Previously identified dilated loops of small bowel are partially decompressed. No free air. Phleboliths. Calcific densities noted over the buttocks most likely injection granulomas. Bibasilar atelectasis. Degenerative changes lumbar spine and both hips.  IMPRESSION: 1. Interim placement of NG  tube. Interim partial decompression of small bowel distention. 2. Cholecystectomy. 3. IVC filter noted in good anatomic position.   Electronically Signed   By: Marcello Moores  Register   On: 10/17/2013 07:39   Dg Abd Acute W/chest  10/15/2013   CLINICAL DATA:  Abdominal pain.  EXAM: ACUTE ABDOMEN SERIES (ABDOMEN 2 VIEW & CHEST 1 VIEW)  COMPARISON:  Chest radiograph, 08/06/2010  FINDINGS: Normal bowel gas pattern. No evidence of obstruction or generalized adynamic ileus. No free air.  There has been a cholecystectomy. Multiple calcifications project on each side of the pelvis consistent with injection granuloma.  Lungs are clear. Cardiac silhouette is normal in size. Aorta is uncoiled.  IMPRESSION: No acute findings in the abdomen or pelvis. No obstruction or free air.  No acute cardiopulmonary disease.   Electronically Signed   By: Lajean Manes M.D.   On: 10/15/2013 10:26    Medications: . antiseptic oral rinse  7 mL Mouth Rinse q12n4p  . brimonidine  1 drop Both Eyes TID  . chlorhexidine  15 mL Mouth Rinse BID  . dorzolamide  1 drop Both Eyes BID  . fentaNYL  12.5 mcg Transdermal Q72H  . heparin subcutaneous  5,000 Units Subcutaneous 3 times per day  . insulin aspart  0-20 Units Subcutaneous TID WC  . latanoprost  1 drop Both Eyes QHS  . pilocarpine  1 drop Both Eyes BID  . sodium chloride  3 mL Intravenous Q12H    Assessment/Plan 1. SBO (she has had multiple abdominal operations)  Has NGT. Still nauseated. No BM.  Will check KUB and labs tomorrow AM.  2. History of stoke  3. DM  4. IVC filter  5. Chronic abdominal pain  She was on a fentanyl patch prior to admission. The patient cannot remember who has her on the patch.  6. Just hospitalized from 7/12 - 09/12/2013 for allergic angioedema  7. DVT prophylaxis - will start SQ Heparin   Plan:  I fixed the NG, leave her on suction.  Get PT to see and help mobilize her.       LOS: 2 days    Katherine [name] 10/17/2013

## 2013-10-17 NOTE — Progress Notes (Signed)
Ref: Patricia Nettle, MD   Subjective:  Abdominal pain continues. No chest pain. T max 99 F  Objective:  Vital Signs in the last 24 hours: Temp:  [97.9 F (36.6 C)-99.1 F (37.3 C)] 98.8 F (37.1 C) (08/17 0435) Pulse Rate:  [72-73] 73 (08/17 0435) Cardiac Rhythm:  [-]  Resp:  [20] 20 (08/17 0435) BP: (146-168)/(74-80) 146/74 mmHg (08/17 0435) SpO2:  [93 %-96 %] 93 % (08/17 0435) Weight:  [93.713 kg (206 lb 9.6 oz)] 93.713 kg (206 lb 9.6 oz) (08/17 0435)  Physical Exam: BP Readings from Last 1 Encounters:  10/17/13 146/74    Wt Readings from Last 1 Encounters:  10/17/13 93.713 kg (206 lb 9.6 oz)    Weight change: 0.862 kg (1 lb 14.4 oz)  HEENT: [place]/AT, Eyes-Brown, PERL, EOMI, Conjunctiva-Pale pink, Sclera-Non-icteric. NG tube with moderate brownish drainage Neck: No JVD, No bruit, Trachea midline. Lungs:  Clear, Bilateral. Cardiac:  Regular rhythm, normal S1 and S2, no S3.  Abdomen:  Distended and tender all over. Extremities:  No edema present. No cyanosis. No clubbing. CNS: AxOx3, Cranial nerves grossly intact, moves all 4 extremities. Right handed. Skin: Warm and dry.   Intake/Output from previous day: 08/16 0701 - 08/17 0700 In: 935.4 [I.V.:885.4; NG/GT:50] Out: 850 [Urine:550; Emesis/NG output:300]    Lab Results: BMET    Component Value Date/Time   NA 139 10/17/2013 0542   NA 140 10/16/2013 0604   NA 144 10/15/2013 0902   K 3.4* 10/17/2013 0542   K 3.6* 10/16/2013 0604   K 3.6* 10/15/2013 0902   CL 106 10/17/2013 0542   CL 105 10/16/2013 0604   CL 106 10/15/2013 0902   CO2 23 10/17/2013 0542   CO2 23 10/16/2013 0604   CO2 24 10/15/2013 0902   GLUCOSE 179* 10/17/2013 0542   GLUCOSE 224* 10/16/2013 0604   GLUCOSE 134* 10/15/2013 0902   BUN 12 10/17/2013 0542   BUN 15 10/16/2013 0604   BUN 11 10/15/2013 0902   CREATININE 0.72 10/17/2013 0542   CREATININE 0.74 10/16/2013 0604   CREATININE 0.76 10/15/2013 0902   CALCIUM 8.2* 10/17/2013 0542   CALCIUM 8.9 10/16/2013 0604    CALCIUM 9.9 10/15/2013 0902   GFRNONAA 79* 10/17/2013 0542   GFRNONAA 78* 10/16/2013 0604   GFRNONAA 78* 10/15/2013 0902   GFRAA >90 10/17/2013 0542   GFRAA >90 10/16/2013 0604   GFRAA 90* 10/15/2013 0902   CBC    Component Value Date/Time   WBC 3.9* 10/17/2013 0542   RBC 4.07 10/17/2013 0542   RBC 3.83* 11/29/2012 1431   HGB 9.7* 10/17/2013 0542   HCT 31.6* 10/17/2013 0542   PLT 238 10/17/2013 0542   MCV 77.6* 10/17/2013 0542   MCH 23.8* 10/17/2013 0542   MCHC 30.7 10/17/2013 0542   RDW 19.2* 10/17/2013 0542   LYMPHSABS 2.0 10/17/2013 0542   MONOABS 0.4 10/17/2013 0542   EOSABS 0.1 10/17/2013 0542   BASOSABS 0.0 10/17/2013 0542   HEPATIC Function Panel  Recent Labs  10/15/13 0902  PROT 7.9   HEMOGLOBIN A1C No components found with this basename: HGA1C,  MPG   CARDIAC ENZYMES Lab Results  Component Value Date   CKTOTAL 112 05/29/2010   CKMB 2.1 05/29/2010   TROPONINI  Value: <0.30        Due to the release kinetics of cTnI, a negative result within the first hours of the onset of symptoms does not rule out myocardial infarction with certainty. If myocardial infarction is still suspected,  repeat the test at appropriate intervals. 08/06/2010   TROPONINI  Value: 0.01        NO INDICATION OF MYOCARDIAL INJURY. 05/29/2010   TROPONINI  Value: 0.02        NO INDICATION OF MYOCARDIAL INJURY. 05/29/2010   BNP No results found for this basename: PROBNP,  in the last 8760 hours TSH No results found for this basename: TSH,  in the last 8760 hours CHOLESTEROL No results found for this basename: CHOL,  in the last 8760 hours  Scheduled Meds: . antiseptic oral rinse  7 mL Mouth Rinse q12n4p  . brimonidine  1 drop Both Eyes TID  . chlorhexidine  15 mL Mouth Rinse BID  . dorzolamide  1 drop Both Eyes BID  . fentaNYL  12.5 mcg Transdermal Q72H  . heparin subcutaneous  5,000 Units Subcutaneous 3 times per day  . insulin aspart  0-20 Units Subcutaneous TID WC  . latanoprost  1 drop Both Eyes QHS  .  pilocarpine  1 drop Both Eyes BID  . sodium chloride  3 mL Intravenous Q12H   Continuous Infusions: . dextrose 5 % and 0.45 % NaCl with KCl 10 mEq/L 100 mL/hr at 10/17/13 0539   PRN Meds:.HYDROmorphone (DILAUDID) injection, ondansetron (ZOFRAN) IV  Assessment/Plan: Acute small bowel obstruction  Hypertension  DM, II  Obesity  H/O stroke  H/O anemia  Osteoarthritis  CAD  Continue IV fluids and NPO. Increase pain medication frequency.  Follow with surgery.   LOS: 1 day    Dixie Dials  MD  10/17/2013, 9:20 AM

## 2013-10-17 NOTE — Evaluation (Signed)
Physical Therapy Evaluation Patient Details Name: Katherine [name] MRN: [id number] DOB: 07-18-1930 Today's Date: 10/17/2013   History of Present Illness  Patient is a 80 y/o female admitted for abdominal pain and vomiting. CT scan in ER showed small bowel obstruction with small amount of free fluid along the margin of the liver. PMH significant for hypertension, stroke, anemia, diabetes, coronary artery disease and multiple abdominal surgeries. NG tube to suction.    Clinical Impression  Patient presents with functional limitations due to deficits listed in PT problem list (see below). Pt with mild balance deficits and pain limiting mobility. Pt lives alone and concerned about safety at discharge. Will assess safety with use of RW for support next session. Pt would benefit from acute PT and follow up HHPT to improve safe mobility and maximize independence so pt can safely return to PLOF.     Follow Up Recommendations Supervision for mobility/OOB;Home health PT    Equipment Recommendations  None recommended by PT    Recommendations for Other Services       Precautions / Restrictions Precautions Precautions: Fall Precaution Comments: NG tube to suction. Restrictions Weight Bearing Restrictions: No      Mobility  Bed Mobility Overal bed mobility: Needs Assistance Bed Mobility: Supine to Sit;Sit to Supine     Supine to sit: Supervision;HOB elevated Sit to supine: Supervision;HOB elevated   General bed mobility comments: Use of rails for support/assist. Mild dizziness reported sitting up however resolved almost immediately.  Transfers Overall transfer level: Needs assistance Equipment used: Straight cane Transfers: Sit to/from Stand Sit to Stand: Min guard         General transfer comment: Stood from EOB with SPC. Min guard for safety. Unsteady.  Ambulation/Gait Ambulation/Gait assistance: Min guard Ambulation Distance (Feet): 60 Feet Assistive device: Straight  cane Gait Pattern/deviations: Step-to pattern;Trunk flexed;Decreased stride length Gait velocity: .68 ft/sec Gait velocity interpretation: <1.8 ft/sec, indicative of risk for recurrent falls General Gait Details: Slow, guarded gait pattern with unsteadiness noted requiring min guard for safety. Complaints of pain during gait. No overt LOB.  Stairs            Wheelchair Mobility    Modified Rankin (Stroke Patients Only)       Balance Overall balance assessment: Needs assistance   Sitting balance-Leahy Scale: Good       Standing balance-Leahy Scale: Poor Standing balance comment: Requires use of SPC for support during dynamic standing activities and gait secondary to balance deficits and pain.                             Pertinent Vitals/Pain Pain Assessment: 0-10 Pain Score: 7  Pain Location: abdominal pain Pain Descriptors / Indicators: Aching;Sore Pain Intervention(s): Limited activity within patient's tolerance;Monitored during session;Repositioned;Patient requesting pain meds-RN notified    Home Living Family/patient expects to be discharged to:: Assisted living               Home Equipment: Walker - 2 wheels;Cane - single point;Bedside commode;Wheelchair - manual      Prior Function Level of Independence: Needs assistance   Gait / Transfers Assistance Needed: Uses SPC vs RW for ambulation. Reports no falls.  ADL's / Homemaking Assistance Needed: Has an aide come in AM and PM for 3 hours to assist with ADLs/IADLs.        Hand Dominance   Dominant Hand: Right    Extremity/Trunk Assessment   Upper Extremity Assessment: Overall  WFL for tasks assessed           Lower Extremity Assessment: Overall WFL for tasks assessed;RLE deficits/detail;LLE deficits/detail         Communication   Communication: No difficulties  Cognition Arousal/Alertness: Awake/alert Behavior During Therapy: WFL for tasks assessed/performed Overall  Cognitive Status: History of cognitive impairments - at baseline                      General Comments General comments (skin integrity, edema, etc.): NG tube clamped prior to PT treatment and hooked up to suction post treatment.    Exercises        Assessment/Plan    PT Assessment Patient needs continued PT services  PT Diagnosis Acute pain;Difficulty walking   PT Problem List Pain;Impaired sensation;Decreased activity tolerance;Decreased safety awareness;Decreased balance;Decreased mobility;Decreased knowledge of precautions  PT Treatment Interventions Balance training;Gait training;Patient/family education;Functional mobility training;Therapeutic activities;Therapeutic exercise;Cognitive remediation;DME instruction   PT Goals (Current goals can be found in the Care Plan section) Acute Rehab PT Goals Patient Stated Goal: to make this pain go away. PT Goal Formulation: With patient Time For Goal Achievement: 10/31/13 Potential to Achieve Goals: Good    Frequency Min 3X/week   Barriers to discharge Decreased caregiver support Pt lives alone.    Co-evaluation               End of Session Equipment Utilized During Treatment: Gait belt Activity Tolerance: Patient limited by pain Patient left: in bed;with call bell/phone within reach;with bed alarm set;with nursing/sitter in room Nurse Communication: Mobility status;Patient requests pain meds         Time: 9741-6384 PT Time Calculation (min): 22 min   Charges:   PT Evaluation $Initial PT Evaluation Tier I: 1 Procedure PT Treatments $Gait Training: 8-22 mins   PT G CodesCandy Sledge A 10/17/2013, 5:19 PM Candy Sledge, [place], DPT [phone]

## 2013-10-17 NOTE — Progress Notes (Signed)
Subjective:  Patient complains of vague abdominal pain. States overall feels little better. Denies any chest pain or shortness of breath. Remains on continuous NG suction . States had BM yesterday  Objective:  Vital Signs in the last 24 hours: Temp:  [97.9 F (36.6 C)-99.1 F (37.3 C)] 98.8 F (37.1 C) (08/17 0435) Pulse Rate:  [72-73] 73 (08/17 0435) Resp:  [20] 20 (08/17 0435) BP: (146-168)/(74-80) 146/74 mmHg (08/17 0435) SpO2:  [93 %-96 %] 93 % (08/17 0435) Weight:  [93.713 kg (206 lb 9.6 oz)] 93.713 kg (206 lb 9.6 oz) (08/17 0435)  Intake/Output from previous day: 08/16 0701 - 08/17 0700 In: 935.4 [I.V.:885.4; NG/GT:50] Out: 850 [Urine:550; Emesis/NG output:300] Intake/Output from this shift: Total I/O In: 0  Out: 300 [Urine:300]  Physical Exam: Neck: no adenopathy, no carotid bruit, no JVD and supple, symmetrical, trachea midline Lungs: Clear anterolaterally Heart: regular rate and rhythm, S1, S2 normal and Soft systolic murmur noted Abdomen: Soft bowel sounds present mild epigastric tenderness no guarding or rebound Extremities: extremities normal, atraumatic, no cyanosis or edema  Lab Results:  Recent Labs  10/16/13 0604 10/17/13 0542  WBC 4.5 3.9*  HGB 10.8* 9.7*  PLT 272 238    Recent Labs  10/16/13 0604 10/17/13 0542  NA 140 139  K 3.6* 3.4*  CL 105 106  CO2 23 23  GLUCOSE 224* 179*  BUN 15 12  CREATININE 0.74 0.72   No results found for this basename: TROPONINI, CK, MB,  in the last 72 hours Hepatic Function Panel  Recent Labs  10/15/13 0902  PROT 7.9  ALBUMIN 3.8  AST 26  ALT 16  ALKPHOS 86  BILITOT 0.4   No results found for this basename: CHOL,  in the last 72 hours No results found for this basename: PROTIME,  in the last 72 hours  Imaging: Imaging results have been reviewed and Ct Abdomen Pelvis Wo Contrast  10/15/2013   CLINICAL DATA:  Nausea and vomiting and epigastric pain, low back pain  EXAM: CT ABDOMEN AND PELVIS WITHOUT  CONTRAST  TECHNIQUE: Multidetector CT imaging of the abdomen and pelvis was performed following the standard protocol without IV contrast.  COMPARISON:  CT 03/14/2013  FINDINGS: There is linear atelectasis the lung bases. No pleural fluid. No pericardial fluid.  Non IV contrast images demonstrate no focal hepatic lesion. Small amount of fluid along the hepatic margin. Postcholecystectomy. The pancreas, spleen, adrenal glands, and kidneys are normal.  The stomach, duodenum are normal. There is minimal progression of the oral contrast in the small bowel. There is fluid-filled loops of small bowel which are at the upper limits of normal in caliber measuring 2.53 cm. The most distal small bowel is small in caliber measuring 5 x 15 mm. The colon is predominantly collapsed. These findings are suggestive of partial small bowel obstruction. Transition . is not readily identified. There is no ventral hernia or inguinal hernia.  Abdominal aorta is normal caliber. There is a IVC filter noted. No free fluid in the pelvis. Post hysterectomy anatomy. No pelvic lymphadenopathy. No aggressive osseous lesion.  IMPRESSION: 1. Fluid filled loops of proximal small bowel with decompressed loops of distal small bowel and colon. Little progression of the oral contrast. Findings suggestive of a partial mechanical obstruction. No transition point is identified. 2. Small amount of free fluid along the margin of the liver.   Electronically Signed   By: Suzy Bouchard M.D.   On: 10/15/2013 13:05   Dg Abd 2 Views  10/17/2013   CLINICAL DATA:  Small bowel obstruction.  EXAM: ABDOMEN - 2 VIEW  COMPARISON:  CT 10/15/2013.  FINDINGS: NG tube noted with its tip projected over the stomach. Surgical clips right upper quadrant. IVC filter noted in good anatomic position. Previously identified dilated loops of small bowel are partially decompressed. No free air. Phleboliths. Calcific densities noted over the buttocks most likely injection  granulomas. Bibasilar atelectasis. Degenerative changes lumbar spine and both hips.  IMPRESSION: 1. Interim placement of NG tube. Interim partial decompression of small bowel distention. 2. Cholecystectomy. 3. IVC filter noted in good anatomic position.   Electronically Signed   By: Marcello Moores  Register   On: 10/17/2013 07:39    Cardiac Studies:  Assessment/Plan:  Resolving small bowel obstruction Hypertension  DM, II  Obesity  H/O stroke  H/O anemia  Osteoarthritis  CAD Plan Continue present conservative management per surgery   LOS: 2 days    Kaelin Holford N 10/17/2013, 10:43 AM

## 2013-10-17 NOTE — Progress Notes (Addendum)
PT with an elevated bp of 175/76. Paged provider on call [id number]. Pt to get nitroglycerin ointment 0.5 inches due at midnight to get placed now

## 2013-10-17 NOTE — Progress Notes (Signed)
Pt BP elevated. Harwani MD paged. Nitro ointment ordered per telephone. Will continue to monitor.

## 2013-10-17 NOTE — Progress Notes (Signed)
INITIAL NUTRITION ASSESSMENT  DOCUMENTATION CODES Per approved criteria  -Obesity Unspecified   INTERVENTION: -Advance diet as tolerated per MD to Carbohydrate Modified -RD will monitor PO intake and add supplements as needed once diet advanced.  -If unable to tolerate POs, consider TPN for alternate nutrition.   NUTRITION DIAGNOSIS: Inadequate oral intake related to inability to eat as evidenced by NPO status  Goal: Patient will meet >/=90% of estimated nutrition needs  Monitor:  Diet advancement, PO intake, weight, labs, I/Os  Reason for Assessment: Malnutrition screening tool  80 y.o. female  Admitting Dx: Bowel obstruction  ASSESSMENT: 80 year old female with known history of recurrent bowel obstruction has significant cramping type epigastric pain this AM. Ct scan in ER showed small bowel obstruction with small amount of free fluid along the margin of the liver.  Patient with history of CVA, she is currently NPO with NGT for suction, 300 ml output.   She has been trying to follow a 1200 calorie, diabetic diet, and reports some weight loss, which is desired. However, she reports that her appetite has been lower than usual. Patient reports that her usual weight is 200-something, but per chart review, weight has been around 184 pounds. No subcutaneous fat or muscle depletion.    Height: Ht Readings from Last 1 Encounters:  10/15/13 5\' 7"  (1.702 m)    Weight: Wt Readings from Last 1 Encounters:  10/17/13 206 lb 9.6 oz (93.713 kg)    Ideal Body Weight: 135 pounds  % Ideal Body Weight: 153%  Wt Readings from Last 10 Encounters:  10/17/13 206 lb 9.6 oz (93.713 kg)  09/12/13 199 lb 8.3 oz (90.5 kg)  10/15/12 184 lb (83.462 kg)  10/15/12 184 lb (83.462 kg)  09/10/12 184 lb (83.462 kg)  09/10/12 184 lb (83.462 kg)  03/16/12 185 lb 3.2 oz (84.006 kg)  03/02/12 184 lb (83.462 kg)  03/02/12 184 lb (83.462 kg)  01/15/12 187 lb 6.4 oz (85.004 kg)    Usual Body  Weight: 184 pounds  % Usual Body Weight: 112%  BMI:  Body mass index is 32.35 kg/(m^2). Patient is obese, class I  Estimated Nutritional Needs: Kcal: 1750-1900 kcal Protein: 100-115 g Fluid: >2.3 L/day  Skin: Intact  Diet Order: NPO  EDUCATION NEEDS: -No education needs identified at this time   Intake/Output Summary (Last 24 hours) at 10/17/13 1512 Last data filed at 10/17/13 1434  Gross per 24 hour  Intake 1515.84 ml  Output   1150 ml  Net 365.84 ml    Last BM: 8/16   Labs:   Recent Labs Lab 10/15/13 0902 10/16/13 0604 10/17/13 0542  NA 144 140 139  K 3.6* 3.6* 3.4*  CL 106 105 106  CO2 24 23 23   BUN 11 15 12   CREATININE 0.76 0.74 0.72  CALCIUM 9.9 8.9 8.2*  GLUCOSE 134* 224* 179*    CBG (last 3)   Recent Labs  10/16/13 2138 10/17/13 0759 10/17/13 1138  GLUCAP 160* 192* 156*    Scheduled Meds: . antiseptic oral rinse  7 mL Mouth Rinse q12n4p  . brimonidine  1 drop Both Eyes TID  . chlorhexidine  15 mL Mouth Rinse BID  . dorzolamide  1 drop Both Eyes BID  . fentaNYL  12.5 mcg Transdermal Q72H  . heparin subcutaneous  5,000 Units Subcutaneous 3 times per day  . insulin aspart  0-20 Units Subcutaneous TID WC  . latanoprost  1 drop Both Eyes QHS  . nitroGLYCERIN  0.5  inch Topical 4 times per day  . pilocarpine  1 drop Both Eyes BID  . sodium chloride  3 mL Intravenous Q12H    Continuous Infusions: . dextrose 5 % and 0.45 % NaCl with KCl 10 mEq/L 100 mL/hr (10/17/13 1506)    Past Medical History  Diagnosis Date  . Bloody discharge from nipple   . Hypertension   . Stroke   . Anemia     mild  . Diabetes mellitus without complication     type 2  . Carotid artery stenosis   . Full dentures   . Wears glasses     Past Surgical History  Procedure Laterality Date  . Cholecystectomy    . Appendectomy    . Eye surgery      right-glaucoma  . Abdominal hysterectomy    . Cataract extraction      left  . Dilation and curettage of uterus     . Tubal ligation    . Breast surgery      bx rt and lt   . Breast ductal system excision  03/02/2012    Procedure: EXCISION DUCTAL SYSTEM BREAST;  Surgeon: Adin Hector, MD;  Location: [facility];  Service: General;  Laterality: Left;  . Angiodysplasia    . Dvt    . Nausea & vomiting    . Rectal bleeding    . Abdominal pain, rlq    . Constipation    . Esophagogastroduodenoscopy N/A 09/10/2012    Procedure: ESOPHAGOGASTRODUODENOSCOPY (EGD);  Surgeon: Beryle Beams, MD;  Location: [facility];  Service: Endoscopy;  Laterality: N/A;  . Hot hemostasis N/A 09/10/2012    Procedure: HOT HEMOSTASIS (ARGON PLASMA COAGULATION/BICAP);  Surgeon: Beryle Beams, MD;  Location: [facility];  Service: Endoscopy;  Laterality: N/A;  . Colonoscopy N/A 10/15/2012    Procedure: COLONOSCOPY;  Surgeon: Beryle Beams, MD;  Location: [facility];  Service: Endoscopy;  Laterality: N/A;    Larey Seat, RD, LDN Pager #: [phone] After-Hours Pager #: [phone]

## 2013-10-17 NOTE — Progress Notes (Signed)
Noticed that pt's NG tube had slipped during shift report. Re-inserted pt's NG tube. Transporter came up to take pt down to x-ray.

## 2013-10-17 NOTE — Progress Notes (Signed)
Patient reports BM yesterday, but none today. Films improved, but still mildly distended.  Will continue NG until tomorrow.  If no improvement, will discuss exploratory laparotomy with lysis of adhesions.  Katherine [name]. [name], MD, [facility]  General/ Trauma Surgery  10/17/2013 4:23 PM

## 2013-10-18 ENCOUNTER — Inpatient Hospital Stay (HOSPITAL_COMMUNITY): Payer: Medicare Other

## 2013-10-18 LAB — GLUCOSE, CAPILLARY
GLUCOSE-CAPILLARY: 149 mg/dL — AB (ref 70–99)
Glucose-Capillary: 190 mg/dL — ABNORMAL HIGH (ref 70–99)
Glucose-Capillary: 177 mg/dL — ABNORMAL HIGH (ref 70–99)
Glucose-Capillary: 140 mg/dL — ABNORMAL HIGH (ref 70–99)

## 2013-10-18 MED ORDER — NITROGLYCERIN 2 % TD OINT
1.0000 [in_us] | TOPICAL_OINTMENT | Freq: Four times a day (QID) | TRANSDERMAL | Status: DC
  Administered 2013-10-18 – 2013-10-22 (×16): 1 [in_us] via TOPICAL

## 2013-10-18 MED ORDER — IOHEXOL 300 MG/ML  SOLN
150.0000 mL | Freq: Once | INTRAMUSCULAR | Status: AC | PRN
  Administered 2013-10-18: 750 mL

## 2013-10-18 MED ORDER — BISACODYL 10 MG RE SUPP
10.0000 mg | Freq: Once | RECTAL | Status: AC
  Administered 2013-10-18: 10 mg via RECTAL
  Filled 2013-10-18: qty 1

## 2013-10-18 NOTE — Progress Notes (Signed)
Pt requesting pain medication at this time. No prn in place. Harwani MD made aware. Per MD hold off on Narcotics. No new orders placed. Will continue to monitor.

## 2013-10-18 NOTE — Progress Notes (Signed)
Still distended with some abdominal tenderness  Films improved.  The patient is on chronic narcotics, so this may be contributing to her ileus/ SBO.  Will obtain water-soluble contrast upper GI with small bowel follow-through.  Katherine [name]. [name], MD, [facility]  General/ Trauma Surgery  10/18/2013 11:27 AM

## 2013-10-18 NOTE — Progress Notes (Signed)
Pt's BP still elevated. Harwani MD paged. Per telephone order increase Nitro ointment to 1 inch. Will continue to monitor.

## 2013-10-18 NOTE — Progress Notes (Signed)
Subjective: Not complaining of nausea, NG cannister has been changed and there is about 200-300 ml in it.  She is just back from Xray, no flatus, no BM and she says she still is tender and hurts.  She has chronic pain.  Ng is poorly secured.  I have ask nurse to fix and put it down about 3 inches more. Dr. Terrence Dupont said she had a BM 2 days ago.    Objective: Vital signs in last 24 hours: Temp:  [98 F (36.7 C)-98.6 F (37 C)] 98.2 F (36.8 C) (08/18 0531) Pulse Rate:  [66-71] 71 (08/18 0531) Resp:  [20] 20 (08/18 0531) BP: (155-192)/(72-90) 178/72 mmHg (08/18 0551) SpO2:  [95 %-99 %] 99 % (08/18 0531) Weight:  [94 kg (207 lb 3.7 oz)] 94 kg (207 lb 3.7 oz) (08/18 0531) Last BM Date: 11-05-2013  425 from the NG recorded 2nd shift yesterday Afebrile, VSS No labs Intake/Output from previous day: 11/06/2022 0701 - 08/18 0700 In: 2313.3 [I.V.:2013.3; NG/GT:300] Out: 1275 [Urine:850; Emesis/NG output:425] Intake/Output this shift:    General appearance: alert, cooperative and no distress GI: she is more tender to palpation this AM, no complaints of nausea this AM.  No distension, no BS, no flatus.  Lab Results:   Recent Labs  10/16/13 0604 05-Nov-2013 0542  WBC 4.5 3.9*  HGB 10.8* 9.7*  HCT 35.3* 31.6*  PLT 272 238    BMET  Recent Labs  10/16/13 0604 Nov 05, 2013 0542  NA 140 139  K 3.6* 3.4*  CL 105 106  CO2 23 23  GLUCOSE 224* 179*  BUN 15 12  CREATININE 0.74 0.72  CALCIUM 8.9 8.2*   PT/INR No results found for this basename: LABPROT, INR,  in the last 72 hours   Recent Labs Lab 10/15/13 0902  AST 26  ALT 16  ALKPHOS 86  BILITOT 0.4  PROT 7.9  ALBUMIN 3.8     Lipase     Component Value Date/Time   LIPASE 22 10/15/2013 0902     Studies/Results: Dg Abd 2 Views  November 05, 2013   CLINICAL DATA:  Small bowel obstruction.  EXAM: ABDOMEN - 2 VIEW  COMPARISON:  CT 10/15/2013.  FINDINGS: NG tube noted with its tip projected over the stomach. Surgical clips right upper  quadrant. IVC filter noted in good anatomic position. Previously identified dilated loops of small bowel are partially decompressed. No free air. Phleboliths. Calcific densities noted over the buttocks most likely injection granulomas. Bibasilar atelectasis. Degenerative changes lumbar spine and both hips.  IMPRESSION: 1. Interim placement of NG tube. Interim partial decompression of small bowel distention. 2. Cholecystectomy. 3. IVC filter noted in good anatomic position.   Electronically Signed   By: Marcello Moores  Register   On: 2013-11-05 07:39    Medications: . antiseptic oral rinse  7 mL Mouth Rinse q12n4p  . brimonidine  1 drop Both Eyes TID  . chlorhexidine  15 mL Mouth Rinse BID  . dorzolamide  1 drop Both Eyes BID  . fentaNYL  12.5 mcg Transdermal Q72H  . heparin subcutaneous  5,000 Units Subcutaneous 3 times per day  . insulin aspart  0-20 Units Subcutaneous TID WC  . latanoprost  1 drop Both Eyes QHS  . nitroGLYCERIN  0.5 inch Topical 4 times per day  . pilocarpine  1 drop Both Eyes BID  . sodium chloride  3 mL Intravenous Q12H    Assessment/Plan 1. SBO (she has had multiple abdominal operations)  Has NGT. Still nauseated. No BM.  Will check KUB and labs tomorrow AM.  2. History of stoke  3. DM HbA1C 9.9 4. IVC filter  5. Chronic abdominal pain  She was on a fentanyl patch prior to admission. The patient cannot remember who has her on the patch.  6. Just hospitalized from 7/12 - 09/12/2013 for allergic angioedema  7. DVT prophylaxis - will start SQ Heparin   Plan:  I will continue NG drainage and NPO x ice chips for now. Film looks better.   If she does not open up soon she may require surgery.  Is she safe for surgery/anesthesia.  She needs to be moving more too.  I have ordered ambulation and PT. Recheck labs in AM.  Can we stop the fentanyl patch? Dulcolax suppository today.   LOS: 3 days    Eddy Termine 10/18/2013

## 2013-10-18 NOTE — Progress Notes (Signed)
Physical Therapy Treatment Patient Details Name: Katherine [name] MRN: [id number] DOB: 03-May-1930 Today's Date: 10/18/2013    History of Present Illness Patient is a 80 y/o female admitted for abdominal pain and vomiting. CT scan in ER showed small bowel obstruction with small amount of free fluid along the margin of the liver. PMH significant for hypertension, stroke, anemia, diabetes, coronary artery disease and multiple abdominal surgeries. NG tube to suction.     PT Comments    Patient with increased pain in abdomen today limiting mobility. Required use of RW for support during gait to minimize pain. Balance improved with use of RW vs SPC. Motivated to participate in therapy despite elevated pain level. RN notified of request for pain medication post session. Will continue to follow and progress as appropriate.   Follow Up Recommendations  Supervision for mobility/OOB;Home health PT     Equipment Recommendations  None recommended by PT    Recommendations for Other Services       Precautions / Restrictions Precautions Precautions: Fall Precaution Comments: NG tube to suction. Restrictions Weight Bearing Restrictions: No    Mobility  Bed Mobility Overal bed mobility: Needs Assistance Bed Mobility: Supine to Sit;Sit to Supine     Supine to sit: Supervision;HOB elevated Sit to supine: Supervision;HOB elevated   General bed mobility comments: Use of rails for support/assist.  Transfers Overall transfer level: Needs assistance Equipment used: None;Rolling walker (2 wheeled) Transfers: Sit to/from [facility] Sit to Stand: Min guard Stand pivot transfers: Min guard       General transfer comment: SPT bed<--> BSC Min guard for safety/lines. Stood from [employer] x2 with RW secondary to increased pain today. Able to perform pericare with min guard for safety.  Ambulation/Gait Ambulation/Gait assistance: Min guard Ambulation Distance (Feet): 75 Feet Assistive  device: Rolling walker (2 wheeled) Gait Pattern/deviations: Trunk flexed;Decreased stride length;Step-through pattern Gait velocity: slow   General Gait Details: Slow, guarded gait pattern secondary to increased pain in abdomen. Increased hip flexion bilaterally as position of comfort. Due to increased pain, pt used RW for support during gait training.   Stairs            Wheelchair Mobility    Modified Rankin (Stroke Patients Only)       Balance     Sitting balance-Leahy Scale: Good       Standing balance-Leahy Scale: Poor Standing balance comment: Requires use of RW for support during gait secondary to pain in abdomen. Able to donn mesh underwear with Min guard assist for safety.                    Cognition Arousal/Alertness: Awake/alert Behavior During Therapy: WFL for tasks assessed/performed Overall Cognitive Status: History of cognitive impairments - at baseline                      Exercises      General Comments General comments (skin integrity, edema, etc.): NG tube clamped prior to PT treatment and hooked up to suction post treatment.      Pertinent Vitals/Pain Pain Assessment: No/denies pain Pain Score: 8  Pain Location: abdominal pain Pain Descriptors / Indicators: Aching;Sharp;Sore Pain Intervention(s): Limited activity within patient's tolerance;Monitored during session;Patient requesting pain meds-RN notified;Repositioned    Home Living                      Prior Function  PT Goals (current goals can now be found in the care plan section) Progress towards PT goals: Progressing toward goals    Frequency  Min 3X/week    PT Plan Current plan remains appropriate    Co-evaluation             End of Session Equipment Utilized During Treatment: Gait belt Activity Tolerance: Patient limited by pain Patient left: in bed;with call bell/phone within reach;with bed alarm set;with nursing/sitter in  room     Time: 1110-1133 PT Time Calculation (min): 23 min  Charges:  $Gait Training: 8-22 mins $Therapeutic Activity: 8-22 mins                    G CodesCandy Sledge A 31-Oct-2013, 12:54 PM Candy Sledge, [place], DPT [phone]

## 2013-10-18 NOTE — Progress Notes (Signed)
Subjective:  Patient denies any chest pain or shortness of breath. Complains of vague abdominal pain off and on. Patient not sure about BM. KUB appears to be improving  Objective:  Vital Signs in the last 24 hours: Temp:  [98 F (36.7 C)-98.6 F (37 C)] 98.2 F (36.8 C) (08/18 0531) Pulse Rate:  [66-71] 71 (08/18 0531) Resp:  [20] 20 (08/18 0531) BP: (155-192)/(72-90) 178/72 mmHg (08/18 0551) SpO2:  [95 %-99 %] 99 % (08/18 0531) Weight:  [94 kg (207 lb 3.7 oz)] 94 kg (207 lb 3.7 oz) (08/18 0531)  Intake/Output from previous day: 08/17 0701 - 08/18 0700 In: 2313.3 [I.V.:2013.3; NG/GT:300] Out: 1275 [Urine:850; Emesis/NG output:425] Intake/Output from this shift:    Physical Exam: Neck: no adenopathy, no carotid bruit, no JVD and supple, symmetrical, trachea midline Lungs: clear to auscultation bilaterally Heart: regular rate and rhythm, S1, S2 normal and Soft systolic murmur noted no S3 gallop Abdomen: Soft faint bowel sounds noted mild generalized tenderness noted no guarding Extremities: extremities normal, atraumatic, no cyanosis or edema  Lab Results:  Recent Labs  10/16/13 0604 10/17/13 0542  WBC 4.5 3.9*  HGB 10.8* 9.7*  PLT 272 238    Recent Labs  10/16/13 0604 10/17/13 0542  NA 140 139  K 3.6* 3.4*  CL 105 106  CO2 23 23  GLUCOSE 224* 179*  BUN 15 12  CREATININE 0.74 0.72   No results found for this basename: TROPONINI, CK, MB,  in the last 72 hours Hepatic Function Panel No results found for this basename: PROT, ALBUMIN, AST, ALT, ALKPHOS, BILITOT, BILIDIR, IBILI,  in the last 72 hours No results found for this basename: CHOL,  in the last 72 hours No results found for this basename: PROTIME,  in the last 72 hours  Imaging: Imaging results have been reviewed and Dg Abd 2 Views  10/18/2013   CLINICAL DATA:  Right lower quadrant abdominal pain, nausea and vomiting, follow-up of partial small bowel obstruction,  EXAM: ABDOMEN - 2 VIEW  COMPARISON:   Abdomen films of 10/17/2013 and CT abdomen pelvis of 10/15/2013  FINDINGS: There has been slight improvement in gaseous distention of small bowel although minimal gaseous distention of small bowel remains. An NG tube is present with the tip overlying the expected midbody of the stomach. No free air is seen on the erect view. There is some colonic bowel gas present. Calcifications overlie the buttocks bilaterally.  IMPRESSION: Slight improvement in degree of partial small bowel obstruction. NG tube tip overlies the mid body of the stomach .   Electronically Signed   By: Ivar Drape M.D.   On: 10/18/2013 08:12   Dg Abd 2 Views  10/17/2013   CLINICAL DATA:  Small bowel obstruction.  EXAM: ABDOMEN - 2 VIEW  COMPARISON:  CT 10/15/2013.  FINDINGS: NG tube noted with its tip projected over the stomach. Surgical clips right upper quadrant. IVC filter noted in good anatomic position. Previously identified dilated loops of small bowel are partially decompressed. No free air. Phleboliths. Calcific densities noted over the buttocks most likely injection granulomas. Bibasilar atelectasis. Degenerative changes lumbar spine and both hips.  IMPRESSION: 1. Interim placement of NG tube. Interim partial decompression of small bowel distention. 2. Cholecystectomy. 3. IVC filter noted in good anatomic position.   Electronically Signed   By: Marcello Moores  Register   On: 10/17/2013 07:39    Cardiac Studies:  Assessment/Plan:  Resolving small bowel obstruction  Hypertension  DM, II  Obesity  H/O  stroke  H/O anemia  Osteoarthritis  CAD stable History of IVC filter Plan DC all narcotics Out of bed to chair Increase ambulation as tolerated Continue present management as per surgery   LOS: 3 days    Jayline Kilburg N 10/18/2013, 11:15 AM

## 2013-10-18 NOTE — Care Management Note (Signed)
    Page 1 of 2   10/22/2013     2:34:54 PM CARE MANAGEMENT NOTE 10/22/2013  Patient:  Katherine [name], Katherine [name]   Account Number:  [id number]  Date Initiated:  10/18/2013  Documentation initiated by:  Tomi Bamberger  Subjective/Objective Assessment:   dx sbo  admit- from [facility].     Action/Plan:   pt eval-HHPT   Anticipated DC Date:  10/22/2013   Anticipated DC Plan:  [place]  CM consult      [facility] Choice  HOME HEALTH   Choice offered to / List presented to:  C-1 Patient        [place] arranged  [place].   Status of service:  Completed, signed off Medicare Important Message given?  YES (If response is "NO", the following Medicare IM given date fields will be blank) Date Medicare IM given:  10/18/2013 Medicare IM given by:  Tomi Bamberger Date Additional Medicare IM given:  10/21/2013 Additional Medicare IM given by:  Tomi Bamberger  Discharge Disposition:  [place]  Per UR Regulation:  Reviewed for med. necessity/level of care/duration of stay  If discussed at [place] of Stay Meetings, dates discussed:   10/20/2013    Comments:  10/22/13 14:30 CM texted add on of aide to [facility] rep, [place] for HHPT/RN/aide.  No other CM needs were communicated. Mariane Masters, BSN, [place] [phone].  10/21/13 [place], BSN [phone] added HHRN to services , AHD notified, patient for dc tomorrow.  10/20/13 [place], BSN [phone] patient conts with loose stools and abd pain per surgery.  10/18/13 [place], BSN [phone] Patient chose [facility] for HHPT, referral  made to [facility], Butch Penny notified.  Soc will begin 24-48 hrs post dc.

## 2013-10-19 LAB — BASIC METABOLIC PANEL
ANION GAP: 11 (ref 5–15)
BUN: 8 mg/dL (ref 6–23)
CHLORIDE: 108 meq/L (ref 96–112)
CO2: 26 meq/L (ref 19–32)
CREATININE: 0.73 mg/dL (ref 0.50–1.10)
Calcium: 9.4 mg/dL (ref 8.4–10.5)
GFR calc Af Amer: >90 mL/min (ref >90)
GFR calc non Af Amer: 79 mL/min — ABNORMAL LOW (ref >90)
Glucose, Bld: 132 mg/dL — ABNORMAL HIGH (ref 70–99)
POTASSIUM: 3.5 meq/L — AB (ref 3.7–5.3)
Sodium: 145 mEq/L (ref 137–147)

## 2013-10-19 LAB — CBC
HEMATOCRIT: 33.7 % — AB (ref 36.0–46.0)
Hemoglobin: 10.5 g/dL — ABNORMAL LOW (ref 12.0–15.0)
MCH: 24.1 pg — ABNORMAL LOW (ref 26.0–34.0)
MCHC: 31.2 g/dL (ref 30.0–36.0)
MCV: 77.3 fL — ABNORMAL LOW (ref 78.0–100.0)
PLATELETS: 292 10*3/uL (ref 150–400)
RBC: 4.36 MIL/uL (ref 3.87–5.11)
RDW: 18.9 % — ABNORMAL HIGH (ref 11.5–15.5)
WBC: 5.3 10*3/uL (ref 4.0–10.5)

## 2013-10-19 LAB — GLUCOSE, CAPILLARY
GLUCOSE-CAPILLARY: 201 mg/dL — AB (ref 70–99)
GLUCOSE-CAPILLARY: 168 mg/dL — AB (ref 70–99)
GLUCOSE-CAPILLARY: 147 mg/dL — AB (ref 70–99)
Glucose-Capillary: 134 mg/dL — ABNORMAL HIGH (ref 70–99)

## 2013-10-19 LAB — PROTIME-INR
INR: 1.04 (ref 0.00–1.49)
Prothrombin Time: 13.6 seconds (ref 11.6–15.2)

## 2013-10-19 LAB — APTT: APTT: 38 s — AB (ref 24–37)

## 2013-10-19 MED ORDER — WHITE PETROLATUM GEL
Status: AC
  Administered 2013-10-19: 0.2
  Filled 2013-10-19: qty 5

## 2013-10-19 MED ORDER — POTASSIUM CHLORIDE CRYS ER 20 MEQ PO TBCR
20.0000 meq | EXTENDED_RELEASE_TABLET | Freq: Two times a day (BID) | ORAL | Status: AC
  Administered 2013-10-19: 20 meq via ORAL
  Filled 2013-10-19: qty 1

## 2013-10-19 MED ORDER — PSYLLIUM 95 % PO PACK
1.0000 | PACK | Freq: Every day | ORAL | Status: DC
  Filled 2013-10-19 (×4): qty 1

## 2013-10-19 NOTE — Progress Notes (Signed)
Normal UGI - SBFT Two loose BM yesterday  No sign of obstruction Possible ileus from chronic narcotics -  No surgical indications.  [name]. [name], MD, [facility]  General/ Trauma Surgery  10/19/2013 10:14 AM

## 2013-10-19 NOTE — Progress Notes (Signed)
Subjective: She is still complaining of pain, but no nausea.  UGI shows no SBO and she has had 2 Bm's yesterday. Objective: Vital signs in last 24 hours: Temp:  [98.7 F (37.1 C)-100 F (37.8 C)] 98.7 F (37.1 C) (08/19 0612) Pulse Rate:  [72-73] 72 (08/19 0612) Resp:  [18-20] 20 (08/19 0612) BP: (157-178)/(66-89) 157/66 mmHg (08/19 0612) SpO2:  [97 %-100 %] 100 % (08/19 0612) Weight:  [88.2 kg (194 lb 7.1 oz)-93.9 kg (207 lb 0.2 oz)] 88.2 kg (194 lb 7.1 oz) (08/19 0655) Last BM Date: 11-09-2013 NPO, 2 BM's recorded.. Tm 100.3 K+ is down some, otherwise the labs are normal UGI WITH SBF:  No abnormality of the stomach or small bowel identified. No evidence of small bowel obstruction.   Intake/Output from previous day: 11-10-22 0701 - 08/19 0700 In: 1190 [I.V.:1190] Out: 1075 [Emesis/NG output:1075] Intake/Output this shift:    General appearance: alert, cooperative, no distress and says she still has pain on and off. GI: soft, non-tender; bowel sounds normal; no masses,  no organomegaly and significant scar lower abdominal wall.  Lab Results:   Recent Labs  10/17/13 0542 10/19/13 0500  WBC 3.9* 5.3  HGB 9.7* 10.5*  HCT 31.6* 33.7*  PLT 238 292    BMET  Recent Labs  10/17/13 0542 10/19/13 0500  NA 139 145  K 3.4* 3.5*  CL 106 108  CO2 23 26  GLUCOSE 179* 132*  BUN 12 8  CREATININE 0.72 0.73  CALCIUM 8.2* 9.4   PT/INR  Recent Labs  10/19/13 0500  LABPROT 13.6  INR 1.04     Recent Labs Lab 10/15/13 0902  AST 26  ALT 16  ALKPHOS 86  BILITOT 0.4  PROT 7.9  ALBUMIN 3.8     Lipase     Component Value Date/Time   LIPASE 22 10/15/2013 0902     Studies/Results: Dg Abd 2 Views  11/09/13   CLINICAL DATA:  Right lower quadrant abdominal pain, nausea and vomiting, follow-up of partial small bowel obstruction,  EXAM: ABDOMEN - 2 VIEW  COMPARISON:  Abdomen films of 10/17/2013 and CT abdomen pelvis of 10/15/2013  FINDINGS: There has been slight  improvement in gaseous distention of small bowel although minimal gaseous distention of small bowel remains. An NG tube is present with the tip overlying the expected midbody of the stomach. No free air is seen on the erect view. There is some colonic bowel gas present. Calcifications overlie the buttocks bilaterally.  IMPRESSION: Slight improvement in degree of partial small bowel obstruction. NG tube tip overlies the mid body of the stomach .   Electronically Signed   By: Ivar Drape M.D.   On: 09-Nov-2013 08:12   Dg Ugi W/small Bowel  09-Nov-2013   CLINICAL DATA:  Chronic abdominal pain. Dilated small bowel loops. Nausea and vomiting. Suspected small bowel obstruction.  EXAM: UPPER GI SERIES WITH SMALL BOWEL FOLLOW-THROUGH  FLUOROSCOPY TIME:  2 min 18 seconds  TECHNIQUE: Single contrast upper GI series was performed using Omnipaque 300 water-soluble contrast administered through the patient's existing nasogastric tube. Subsequently, serial images of the small bowel were obtained including spot views of the terminal ileum.  COMPARISON:  Abdominal radiograph earlier today  FINDINGS: Esophagus: Not evaluated as contrast was administered through the patient's existing nasogastric tube.  Stomach: No hiatal hernia visualized. No evidence of gastric mass or ulcer. Nasogastric tube tip is seen in the duodenal bulb. Right upper quadrant surgical clips also seen from prior cholecystectomy.  Duodenum:  No ulcer or other significant abnormality identified.  Jejunum and Ileum: No evidence of small bowel dilatation or obstruction. Transit time is within normal limits. No evidence of mass, stricture, or wall thickening. Jejunal and ileal fold patterns are within normal limits. Spot views of terminal ileum are also unremarkable.  Other:  None.  IMPRESSION: No abnormality of the stomach or small bowel identified. No evidence of small bowel obstruction.   Electronically Signed   By: Earle Gell M.D.   On: 10/18/2013 16:31     Medications: . antiseptic oral rinse  7 mL Mouth Rinse q12n4p  . brimonidine  1 drop Both Eyes TID  . chlorhexidine  15 mL Mouth Rinse BID  . dorzolamide  1 drop Both Eyes BID  . heparin subcutaneous  5,000 Units Subcutaneous 3 times per day  . insulin aspart  0-20 Units Subcutaneous TID WC  . latanoprost  1 drop Both Eyes QHS  . nitroGLYCERIN  1 inch Topical 4 times per day  . pilocarpine  1 drop Both Eyes BID  . sodium chloride  3 mL Intravenous Q12H    Assessment/Plan . SBO (she has had multiple abdominal operations)  Has NGT. Still nauseated. No BM.  Will check KUB and labs tomorrow AM.  2. History of stoke  3. DM HbA1C 9.9  4. IVC filter  5. Chronic abdominal pain  She was on a fentanyl patch prior to admission. The patient cannot remember who has her on the patch.  6. Just hospitalized from 7/12 - 09/12/2013 for allergic angioedema  7. DVT prophylaxis - will start SQ Heparin   PLan:  D/C the NG, clear liquids, mobilize and give her some extra K+.  This may all be ileus from chronic pain and Fentanyl use. I gave her 40 meq of Kcl for today.  LOS: 4 days    Bayler Nehring 10/19/2013

## 2013-10-19 NOTE — Progress Notes (Signed)
Subjective:  Continues to have vague abdominal pain. Tolerated clear liquid earlier this a.m. no vomiting states has not had  BM today  Objective:  Vital Signs in the last 24 hours: Temp:  [98.7 F (37.1 C)-100 F (37.8 C)] 98.7 F (37.1 C) (08/19 0612) Pulse Rate:  [72-73] 72 (08/19 0612) Resp:  [18-20] 20 (08/19 0612) BP: (157-178)/(66-89) 157/66 mmHg (08/19 0612) SpO2:  [97 %-100 %] 100 % (08/19 0612) Weight:  [88.2 kg (194 lb 7.1 oz)-93.9 kg (207 lb 0.2 oz)] 88.2 kg (194 lb 7.1 oz) (08/19 0655)  Intake/Output from previous day: 08/18 0701 - 08/19 0700 In: 1190 [I.V.:1190] Out: 1075 [Emesis/NG output:1075] Intake/Output from this shift: Total I/O In: 120 [P.O.:120] Out: 350 [Urine:200; Stool:150]  Physical Exam: Neck: no adenopathy, no carotid bruit, no JVD and supple, symmetrical, trachea midline Lungs: clear to auscultation bilaterally Heart: regular rate and rhythm, S1, S2 normal and Soft systolic murmur noted no S3 gallop Abdomen: Soft mildly distended mild generalized tenderness noted no guarding Extremities: extremities normal, atraumatic, no cyanosis or edema  Lab Results:  Recent Labs  10/17/13 0542 10/19/13 0500  WBC 3.9* 5.3  HGB 9.7* 10.5*  PLT 238 292    Recent Labs  10/17/13 0542 10/19/13 0500  NA 139 145  K 3.4* 3.5*  CL 106 108  CO2 23 26  GLUCOSE 179* 132*  BUN 12 8  CREATININE 0.72 0.73   No results found for this basename: TROPONINI, CK, MB,  in the last 72 hours Hepatic Function Panel No results found for this basename: PROT, ALBUMIN, AST, ALT, ALKPHOS, BILITOT, BILIDIR, IBILI,  in the last 72 hours No results found for this basename: CHOL,  in the last 72 hours No results found for this basename: PROTIME,  in the last 72 hours  Imaging: Imaging results have been reviewed and Dg Abd 2 Views  10/18/2013   CLINICAL DATA:  Right lower quadrant abdominal pain, nausea and vomiting, follow-up of partial small bowel obstruction,  EXAM:  ABDOMEN - 2 VIEW  COMPARISON:  Abdomen films of 10/17/2013 and CT abdomen pelvis of 10/15/2013  FINDINGS: There has been slight improvement in gaseous distention of small bowel although minimal gaseous distention of small bowel remains. An NG tube is present with the tip overlying the expected midbody of the stomach. No free air is seen on the erect view. There is some colonic bowel gas present. Calcifications overlie the buttocks bilaterally.  IMPRESSION: Slight improvement in degree of partial small bowel obstruction. NG tube tip overlies the mid body of the stomach .   Electronically Signed   By: Ivar Drape M.D.   On: 10/18/2013 08:12   Dg Ugi W/small Bowel  10/18/2013   CLINICAL DATA:  Chronic abdominal pain. Dilated small bowel loops. Nausea and vomiting. Suspected small bowel obstruction.  EXAM: UPPER GI SERIES WITH SMALL BOWEL FOLLOW-THROUGH  FLUOROSCOPY TIME:  2 min 18 seconds  TECHNIQUE: Single contrast upper GI series was performed using Omnipaque 300 water-soluble contrast administered through the patient's existing nasogastric tube. Subsequently, serial images of the small bowel were obtained including spot views of the terminal ileum.  COMPARISON:  Abdominal radiograph earlier today  FINDINGS: Esophagus: Not evaluated as contrast was administered through the patient's existing nasogastric tube.  Stomach: No hiatal hernia visualized. No evidence of gastric mass or ulcer. Nasogastric tube tip is seen in the duodenal bulb. Right upper quadrant surgical clips also seen from prior cholecystectomy.  Duodenum:  No ulcer or other significant  abnormality identified.  Jejunum and Ileum: No evidence of small bowel dilatation or obstruction. Transit time is within normal limits. No evidence of mass, stricture, or wall thickening. Jejunal and ileal fold patterns are within normal limits. Spot views of terminal ileum are also unremarkable.  Other:  None.  IMPRESSION: No abnormality of the stomach or small bowel  identified. No evidence of small bowel obstruction.   Electronically Signed   By: Earle Gell M.D.   On: 10/18/2013 16:31    Cardiac Studies:  Assessment/Plan:   Status post small bowel obstruction  Generalized abdominal pain probably secondary to ileaus. Hypertension  DM, II  Obesity  H/O stroke  H/O anemia  Osteoarthritis  CAD stable  History of IVC filter  Plan Continue present management per surgery And advanced diet as per surgery  LOS: 4 days    Katherine [name] N 10/19/2013, 12:18 PM

## 2013-10-20 DIAGNOSIS — R109 Unspecified abdominal pain: Secondary | ICD-10-CM

## 2013-10-20 LAB — GLUCOSE, CAPILLARY
GLUCOSE-CAPILLARY: 152 mg/dL — AB (ref 70–99)
GLUCOSE-CAPILLARY: 113 mg/dL — AB (ref 70–99)
Glucose-Capillary: 118 mg/dL — ABNORMAL HIGH (ref 70–99)
Glucose-Capillary: 120 mg/dL — ABNORMAL HIGH (ref 70–99)

## 2013-10-20 MED ORDER — ZOLPIDEM TARTRATE 5 MG PO TABS
5.0000 mg | ORAL_TABLET | Freq: Every evening | ORAL | Status: DC | PRN
  Administered 2013-10-20 – 2013-10-21 (×2): 5 mg via ORAL
  Filled 2013-10-20 (×2): qty 1

## 2013-10-20 MED ORDER — PANTOPRAZOLE SODIUM 40 MG PO TBEC
40.0000 mg | DELAYED_RELEASE_TABLET | Freq: Two times a day (BID) | ORAL | Status: DC
  Administered 2013-10-20: 40 mg via ORAL
  Filled 2013-10-20 (×5): qty 1

## 2013-10-20 NOTE — Progress Notes (Signed)
Patient ID: Katherine [name], female   DOB: 1930-08-09, 80 y.o.   MRN: [id number]     [facility]      [address] [place] [place], [place] [address]    Phone: [phone] FAX: [phone]     Subjective: No n/v.  Continues to have abdominal pain.  Having loose stools/diarrhea.    Objective:  Vital signs:  Filed Vitals:   10/19/13 1502 10/19/13 2203 10/20/13 0608 10/20/13 0616  BP: 149/82 167/74 168/81   Pulse: 75 75 68   Temp: 100.2 F (37.9 C) 98.7 F (37.1 C) 98.8 F (37.1 C)   TempSrc: Oral Oral Oral   Resp: '20 18 18   ' Height:      Weight:    196 lb 4.8 oz (89.041 kg)  SpO2: 97% 95% 96%     Last BM Date: 10/18/13  Intake/Output   Yesterday:  08/19 0701 - 08/20 0700 In: 2773.3 [P.O.:360; I.V.:2413.3] Out: 350 [Urine:200; Stool:150] This shift:    I/O last 3 completed shifts: In: 2773.3 [P.O.:360; I.V.:2413.3] Out: 850 [Urine:200; Emesis/NG output:500; Stool:150]    Physical Exam: Declined.   Problem List:   Active Problems:   Bowel obstruction    Results:   Labs: Results for orders placed during the hospital encounter of 10/15/13 (from the past 48 hour(s))  GLUCOSE, CAPILLARY     Status: Abnormal   Collection Time    10/18/13  4:56 PM      Result Value Ref Range   Glucose-Capillary 149 (*) 70 - 99 mg/dL  GLUCOSE, CAPILLARY     Status: Abnormal   Collection Time    10/18/13  9:46 PM      Result Value Ref Range   Glucose-Capillary 140 (*) 70 - 99 mg/dL   Comment 1 Notify RN     Comment 2 Documented in Chart    CBC     Status: Abnormal   Collection Time    10/19/13  5:00 AM      Result Value Ref Range   WBC 5.3  4.0 - 10.5 K/uL   RBC 4.36  3.87 - 5.11 MIL/uL   Hemoglobin 10.5 (*) 12.0 - 15.0 g/dL   HCT 33.7 (*) 36.0 - 46.0 %   MCV 77.3 (*) 78.0 - 100.0 fL   MCH 24.1 (*) 26.0 - 34.0 pg   MCHC 31.2  30.0 - 36.0 g/dL   RDW 18.9 (*) 11.5 - 15.5 %   Platelets 292  150 - 400 K/uL  BASIC METABOLIC PANEL      Status: Abnormal   Collection Time    10/19/13  5:00 AM      Result Value Ref Range   Sodium 145  137 - 147 mEq/L   Potassium 3.5 (*) 3.7 - 5.3 mEq/L   Chloride 108  96 - 112 mEq/L   CO2 26  19 - 32 mEq/L   Glucose, Bld 132 (*) 70 - 99 mg/dL   BUN 8  6 - 23 mg/dL   Creatinine, Ser 0.73  0.50 - 1.10 mg/dL   Calcium 9.4  8.4 - 10.5 mg/dL   GFR calc non Af Amer 79 (*) >90 mL/min   GFR calc Af Amer >90  >90 mL/min   Comment: (NOTE)     The eGFR has been calculated using the CKD EPI equation.     This calculation has not been validated in all clinical situations.     eGFR's persistently <90 mL/min  signify possible Chronic Kidney     Disease.   Anion gap 11  5 - 15  PROTIME-INR     Status: None   Collection Time    10/19/13  5:00 AM      Result Value Ref Range   Prothrombin Time 13.6  11.6 - 15.2 seconds   INR 1.04  0.00 - 1.49  APTT     Status: Abnormal   Collection Time    10/19/13  5:00 AM      Result Value Ref Range   aPTT 38 (*) 24 - 37 seconds   Comment:            IF BASELINE aPTT IS ELEVATED,     SUGGEST PATIENT RISK ASSESSMENT     BE USED TO DETERMINE APPROPRIATE     ANTICOAGULANT THERAPY.  GLUCOSE, CAPILLARY     Status: Abnormal   Collection Time    10/19/13  8:18 AM      Result Value Ref Range   Glucose-Capillary 134 (*) 70 - 99 mg/dL  GLUCOSE, CAPILLARY     Status: Abnormal   Collection Time    10/19/13 12:36 PM      Result Value Ref Range   Glucose-Capillary 201 (*) 70 - 99 mg/dL  GLUCOSE, CAPILLARY     Status: Abnormal   Collection Time    10/19/13  4:42 PM      Result Value Ref Range   Glucose-Capillary 168 (*) 70 - 99 mg/dL  GLUCOSE, CAPILLARY     Status: Abnormal   Collection Time    10/19/13  9:58 PM      Result Value Ref Range   Glucose-Capillary 147 (*) 70 - 99 mg/dL   Comment 1 Notify RN     Comment 2 Documented in Chart    GLUCOSE, CAPILLARY     Status: Abnormal   Collection Time    10/20/13  7:54 AM      Result Value Ref Range    Glucose-Capillary 152 (*) 70 - 99 mg/dL  GLUCOSE, CAPILLARY     Status: Abnormal   Collection Time    10/20/13 12:20 PM      Result Value Ref Range   Glucose-Capillary 118 (*) 70 - 99 mg/dL    Imaging / Studies: Dg Ugi W/small Bowel  10/18/2013   CLINICAL DATA:  Chronic abdominal pain. Dilated small bowel loops. Nausea and vomiting. Suspected small bowel obstruction.  EXAM: UPPER GI SERIES WITH SMALL BOWEL FOLLOW-THROUGH  FLUOROSCOPY TIME:  2 min 18 seconds  TECHNIQUE: Single contrast upper GI series was performed using Omnipaque 300 water-soluble contrast administered through the patient's existing nasogastric tube. Subsequently, serial images of the small bowel were obtained including spot views of the terminal ileum.  COMPARISON:  Abdominal radiograph earlier today  FINDINGS: Esophagus: Not evaluated as contrast was administered through the patient's existing nasogastric tube.  Stomach: No hiatal hernia visualized. No evidence of gastric mass or ulcer. Nasogastric tube tip is seen in the duodenal bulb. Right upper quadrant surgical clips also seen from prior cholecystectomy.  Duodenum:  No ulcer or other significant abnormality identified.  Jejunum and Ileum: No evidence of small bowel dilatation or obstruction. Transit time is within normal limits. No evidence of mass, stricture, or wall thickening. Jejunal and ileal fold patterns are within normal limits. Spot views of terminal ileum are also unremarkable.  Other:  None.  IMPRESSION: No abnormality of the stomach or small bowel identified. No evidence of small bowel obstruction.  Electronically Signed   By: Earle Gell M.D.   On: 10/18/2013 16:31    Medications / Allergies:  Scheduled Meds: . brimonidine  1 drop Both Eyes TID  . dorzolamide  1 drop Both Eyes BID  . heparin subcutaneous  5,000 Units Subcutaneous 3 times per day  . insulin aspart  0-20 Units Subcutaneous TID WC  . latanoprost  1 drop Both Eyes QHS  . nitroGLYCERIN  1 inch  Topical 4 times per day  . pantoprazole  40 mg Oral BID  . pilocarpine  1 drop Both Eyes BID  . psyllium  1 packet Oral QHS  . sodium chloride  3 mL Intravenous Q12H   Continuous Infusions: . dextrose 5 % and 0.45 % NaCl with KCl 10 mEq/L 100 mL/hr at 10/20/13 0504   PRN Meds:.ondansetron (ZOFRAN) IV  Antibiotics: Anti-infectives   None        Assessment/Plan Abdominal pain- Having loose stools. Tolerating clears, but having abdominal pain.  She would not allow me to examine her today and told me to come back tomorrow.  normal UGI with SBFT and therefore no surgical indications.  Advance diet as tolerated.    Erby Pian, [facility] Pager [phone]) For consults and floor pages call [phone](7A-4:30P)  10/20/2013 12:28 PM

## 2013-10-20 NOTE — Progress Notes (Signed)
Physical Therapy Treatment Patient Details Name: Katherine [name] MRN: [id number] DOB: 20-Nov-1930 Today's Date: 10/20/2013    History of Present Illness Patient is a 80 y/o female admitted for abdominal pain and vomiting. CT scan in ER showed small bowel obstruction with small amount of free fluid along the margin of the liver. PMH significant for hypertension, stroke, anemia, diabetes, coronary artery disease and multiple abdominal surgeries. NG tube removed and SBO resolved.    PT Comments    Patient progressing well with mobility. Very irritated and upset about not getting answers about her care/pain. Concerned about bowel movements. Required some convincing to participate in therapy initially. Dynamic standing balance improved. Pain appears improved as pt easily distracted with conversation during gait training allowing to pt improve ambulation distance. Pt setup for bath sitting EOB upon PT departure. Will continue to follow and progress.   Follow Up Recommendations  Supervision for mobility/OOB;Home health PT     Equipment Recommendations  None recommended by PT    Recommendations for Other Services       Precautions / Restrictions Precautions Precautions: Fall Restrictions Weight Bearing Restrictions: No    Mobility  Bed Mobility               General bed mobility comments: Received sitting EOB upon arrival.  Transfers Overall transfer level: Needs assistance Equipment used: Rolling walker (2 wheeled) Transfers: Sit to/from [facility] Sit to Stand: Supervision Stand pivot transfers: Supervision       General transfer comment: Stood from EOB x3 with RW. Supervision for safety. Ambulated to toilet with RW. VC to keep RW with patient until destination for safety. SPT on/off toilet, use of grab bar for support. Performed pericare independently.  Ambulation/Gait Ambulation/Gait assistance: Min guard Ambulation Distance (Feet): 150 Feet Assistive  device: Rolling walker (2 wheeled) Gait Pattern/deviations: Decreased stride length;Trunk flexed;Step-through pattern Gait velocity: .8 ft/sec Gait velocity interpretation: <1.8 ft/sec, indicative of risk for recurrent falls General Gait Details: Slow, guarded gait pattern secondary to pain in abdomen. VC for RW management as pt tends to leave walker to the side at times during room ambulation.  Recommend use of RW for support to assist with pain control vs SPC.   Stairs            Wheelchair Mobility    Modified Rankin (Stroke Patients Only)       Balance Overall balance assessment: Needs assistance   Sitting balance-Leahy Scale: Good Sitting balance - Comments: Able to reach outside BoS posteriorly and anteriorly without LOB to grab items on tray. No LOB.     Standing balance-Leahy Scale: Fair Standing balance comment: Able to reach outside BOS in standing without LOB- reaching into closet for towels and fixing pad on bed. Use of RW for support during longer distances due to pain and unsteadiness. Able to donn mesh underwear without UE support.                    Cognition Arousal/Alertness: Awake/alert Behavior During Therapy: Agitated (Pt very upset today due to still having pain in abdomen and with liquid stools.) Overall Cognitive Status: Within Functional Limits for tasks assessed                      Exercises      General Comments        Pertinent Vitals/Pain Pain Assessment: 0-10 Pain Score:  (Not rated on pain scale.) Pain Location: Right sided abdominal pain Pain  Descriptors / Indicators: Aching Pain Intervention(s): Monitored during session;Repositioned    Home Living                      Prior Function            PT Goals (current goals can now be found in the care plan section) Progress towards PT goals: Progressing toward goals    Frequency  Min 3X/week    PT Plan Current plan remains appropriate     Co-evaluation             End of Session Equipment Utilized During Treatment: Gait belt Activity Tolerance: Patient tolerated treatment well Patient left: in bed;with call bell/phone within reach;with bed alarm set (sitting EOB.)     Time: 2703-5009 PT Time Calculation (min): 32 min  Charges:  $Gait Training: 8-22 mins $Therapeutic Activity: 8-22 mins                    G CodesCandy Sledge A November 08, 2013, 10:43 AM Candy Sledge, PT, DPT [phone]

## 2013-10-20 NOTE — Progress Notes (Signed)
No sign of obstruction.  No surgical indications.  Katherine [name]. [name], MD, [facility]  General/ Trauma Surgery  10/20/2013 3:50 PM

## 2013-10-20 NOTE — Progress Notes (Signed)
Pt requesting Ambien to help her sleep. Dr. Terrence Dupont paged, Lorrin Mais ordered.

## 2013-10-20 NOTE — Progress Notes (Signed)
Subjective:  Patient denies any chest pain or shortness of breath complains of vague abdominal pain. Overall feels better.  Objective:  Vital Signs in the last 24 hours: Temp:  [98.7 F (37.1 C)-100.2 F (37.9 C)] 98.8 F (37.1 C) (08/20 0608) Pulse Rate:  [68-75] 68 (08/20 0608) Resp:  [18-20] 18 (08/20 0608) BP: (149-168)/(74-82) 168/81 mmHg (08/20 0608) SpO2:  [95 %-97 %] 96 % (08/20 0608) Weight:  [89.041 kg (196 lb 4.8 oz)] 89.041 kg (196 lb 4.8 oz) (08/20 0616)  Intake/Output from previous day: 08/19 0701 - 08/20 0700 In: 2773.3 [P.O.:360; I.V.:2413.3] Out: 350 [Urine:200; Stool:150] Intake/Output from this shift:    Physical Exam: Neck: no adenopathy, no carotid bruit, no JVD and supple, symmetrical, trachea midline Lungs: clear to auscultation bilaterally Heart: regular rate and rhythm, S1, S2 normal and soft systolic murmur noted no S3 gallop Abdomen: soft bowel sounds faint mild generalized tenderness no guarding Extremities: extremities normal, atraumatic, no cyanosis or edema  Lab Results:  Recent Labs  10/19/13 0500  WBC 5.3  HGB 10.5*  PLT 292    Recent Labs  10/19/13 0500  NA 145  K 3.5*  CL 108  CO2 26  GLUCOSE 132*  BUN 8  CREATININE 0.73   No results found for this basename: TROPONINI, CK, MB,  in the last 72 hours Hepatic Function Panel No results found for this basename: PROT, ALBUMIN, AST, ALT, ALKPHOS, BILITOT, BILIDIR, IBILI,  in the last 72 hours No results found for this basename: CHOL,  in the last 72 hours No results found for this basename: PROTIME,  in the last 72 hours  Imaging: Imaging results have been reviewed and Dg Ugi W/small Bowel  10/18/2013   CLINICAL DATA:  Chronic abdominal pain. Dilated small bowel loops. Nausea and vomiting. Suspected small bowel obstruction.  EXAM: UPPER GI SERIES WITH SMALL BOWEL FOLLOW-THROUGH  FLUOROSCOPY TIME:  2 min 18 seconds  TECHNIQUE: Single contrast upper GI series was performed using  Omnipaque 300 water-soluble contrast administered through the patient's existing nasogastric tube. Subsequently, serial images of the small bowel were obtained including spot views of the terminal ileum.  COMPARISON:  Abdominal radiograph earlier today  FINDINGS: Esophagus: Not evaluated as contrast was administered through the patient's existing nasogastric tube.  Stomach: No hiatal hernia visualized. No evidence of gastric mass or ulcer. Nasogastric tube tip is seen in the duodenal bulb. Right upper quadrant surgical clips also seen from prior cholecystectomy.  Duodenum:  No ulcer or other significant abnormality identified.  Jejunum and Ileum: No evidence of small bowel dilatation or obstruction. Transit time is within normal limits. No evidence of mass, stricture, or wall thickening. Jejunal and ileal fold patterns are within normal limits. Spot views of terminal ileum are also unremarkable.  Other:  None.  IMPRESSION: No abnormality of the stomach or small bowel identified. No evidence of small bowel obstruction.   Electronically Signed   By: Earle Gell M.D.   On: 10/18/2013 16:31    Cardiac Studies:  Assessment/Plan:  Status post small bowel obstruction  Generalized abdominal pain probably secondary to ileaus.  Hypertension  DM, II  Obesity  H/O stroke  H/O anemia  Osteoarthritis  CAD stable  History of IVC filter  Plan Advance diet as tolerated Add PPI  Increase ambulation  LOS: 5 days    Creek Gan N 10/20/2013, 11:32 AM

## 2013-10-21 LAB — GLUCOSE, CAPILLARY
GLUCOSE-CAPILLARY: 134 mg/dL — AB (ref 70–99)
Glucose-Capillary: 136 mg/dL — ABNORMAL HIGH (ref 70–99)
Glucose-Capillary: 142 mg/dL — ABNORMAL HIGH (ref 70–99)
Glucose-Capillary: 154 mg/dL — ABNORMAL HIGH (ref 70–99)

## 2013-10-21 NOTE — Progress Notes (Signed)
Subjective:  Patient denies any chest pain or shortness of breath states abdominal pain is better.  Objective:  Vital Signs in the last 24 hours: Temp:  [98.1 F (36.7 C)-98.6 F (37 C)] 98.6 F (37 C) (08/21 0612) Pulse Rate:  [60-87] 87 (08/21 0612) Resp:  [17-18] 18 (08/21 0612) BP: (134-154)/(72-82) 154/76 mmHg (08/21 0612) SpO2:  [95 %-97 %] 95 % (08/21 0612) Weight:  [89.041 kg (196 lb 4.8 oz)] 89.041 kg (196 lb 4.8 oz) (08/21 0612)  Intake/Output from previous day: 08/20 0701 - 08/21 0700 In: 120 [P.O.:120] Out: -  Intake/Output from this shift:    Physical Exam: Neck: no adenopathy, no carotid bruit, no JVD and supple, symmetrical, trachea midline Lungs: clear to auscultation bilaterally Heart: regular rate and rhythm, S1, S2 normal, no murmur, click, rub or gallop Abdomen: Soft bowel sounds present mild generalized tenderness Extremities: extremities normal, atraumatic, no cyanosis or edema  Lab Results:  Recent Labs  10/19/13 0500  WBC 5.3  HGB 10.5*  PLT 292    Recent Labs  10/19/13 0500  NA 145  K 3.5*  CL 108  CO2 26  GLUCOSE 132*  BUN 8  CREATININE 0.73   No results found for this basename: TROPONINI, CK, MB,  in the last 72 hours Hepatic Function Panel No results found for this basename: PROT, ALBUMIN, AST, ALT, ALKPHOS, BILITOT, BILIDIR, IBILI,  in the last 72 hours No results found for this basename: CHOL,  in the last 72 hours No results found for this basename: PROTIME,  in the last 72 hours  Imaging: Imaging results have been reviewed and No results found.  Cardiac Studies:  Assessment/Plan:  Status post small bowel obstruction  Generalized abdominal pain probably secondary to ileaus.  Hypertension  DM, II  Obesity  H/O stroke  H/O anemia  Osteoarthritis  CAD stable  History of IVC filter  Plan Advanced diet as tolerated Possible discharge tomorrow if stable  LOS: 6 days    Srihari Shellhammer N 10/21/2013, 9:02 AM

## 2013-10-21 NOTE — Progress Notes (Signed)
[name]. [name], MD, [facility]  General/ Trauma Surgery  10/21/2013 10:29 AM

## 2013-10-21 NOTE — Progress Notes (Signed)
Patient ID: Katherine [name], female   DOB: 06/03/1930, 80 y.o.   MRN: [id number]     [facility]      [address] [place], [place] [address]    Phone: [phone] FAX: [phone]     Subjective: Very pleasant and conversant today.  Still has abdominal pain, but does not appear in any distress.  VSS.  Off all pain meds.  Still having BM and tolerating liquids.   Objective:  Vital signs:  Filed Vitals:   10/20/13 0616 10/20/13 1409 10/20/13 2248 10/21/13 0612  BP:  134/82 144/72 154/76  Pulse:  70 60 87  Temp:  98.1 F (36.7 C) 98.5 F (36.9 C) 98.6 F (37 C)  TempSrc:  Oral Oral Oral  Resp:  18 17 18   Height:      Weight: 196 lb 4.8 oz (89.041 kg)   196 lb 4.8 oz (89.041 kg)  SpO2:  97% 97% 95%    Last BM Date: 10/19/13  Intake/Output   Yesterday:  08/20 0701 - 08/21 0700 In: 120 [P.O.:120] Out: -  This shift:    I/O last 3 completed shifts: In: 360 [P.O.:360] Out: -     Physical Exam: General: Pt awake/alert/oriented x4 in no acute distress Abdomen: Soft.  Nondistended.  Mildly tender to epigastric region.  No evidence of peritonitis.  No incarcerated hernias.  Problem List:   Active Problems:   Bowel obstruction    Results:   Labs: Results for orders placed during the hospital encounter of 10/15/13 (from the past 48 hour(s))  GLUCOSE, CAPILLARY     Status: Abnormal   Collection Time    10/19/13 12:36 PM      Result Value Ref Range   Glucose-Capillary 201 (*) 70 - 99 mg/dL  GLUCOSE, CAPILLARY     Status: Abnormal   Collection Time    10/19/13  4:42 PM      Result Value Ref Range   Glucose-Capillary 168 (*) 70 - 99 mg/dL  GLUCOSE, CAPILLARY     Status: Abnormal   Collection Time    10/19/13  9:58 PM      Result Value Ref Range   Glucose-Capillary 147 (*) 70 - 99 mg/dL   Comment 1 Notify RN     Comment 2 Documented in Chart    GLUCOSE, CAPILLARY     Status: Abnormal   Collection Time   10/20/13  7:54 AM      Result Value Ref Range   Glucose-Capillary 152 (*) 70 - 99 mg/dL  GLUCOSE, CAPILLARY     Status: Abnormal   Collection Time    10/20/13 12:20 PM      Result Value Ref Range   Glucose-Capillary 118 (*) 70 - 99 mg/dL  GLUCOSE, CAPILLARY     Status: Abnormal   Collection Time    10/20/13  5:09 PM      Result Value Ref Range   Glucose-Capillary 120 (*) 70 - 99 mg/dL  GLUCOSE, CAPILLARY     Status: Abnormal   Collection Time    10/20/13 10:45 PM      Result Value Ref Range   Glucose-Capillary 113 (*) 70 - 99 mg/dL  GLUCOSE, CAPILLARY     Status: Abnormal   Collection Time    10/21/13  7:51 AM      Result Value Ref Range   Glucose-Capillary 136 (*) 70 - 99 mg/dL    Imaging / Studies: No results found.  Medications / Allergies:  Scheduled Meds: . brimonidine  1 drop Both Eyes TID  . dorzolamide  1 drop Both Eyes BID  . heparin subcutaneous  5,000 Units Subcutaneous 3 times per day  . insulin aspart  0-20 Units Subcutaneous TID WC  . latanoprost  1 drop Both Eyes QHS  . nitroGLYCERIN  1 inch Topical 4 times per day  . pantoprazole  40 mg Oral BID  . pilocarpine  1 drop Both Eyes BID  . psyllium  1 packet Oral QHS  . sodium chloride  3 mL Intravenous Q12H   Continuous Infusions: . dextrose 5 % and 0.45 % NaCl with KCl 10 mEq/L 100 mL/hr at 10/20/13 0504   PRN Meds:.ondansetron (ZOFRAN) IV, zolpidem  Antibiotics: Anti-infectives   None        Assessment/Plan PSBO Abdominal pain -UGI with SBFT was normal.  She continues to have diarrhea.  No signs of obstruction.  Agree with advancing diet.  Surgery will sign off.  Please call with questions or concerns.    Erby Pian, [facility] Pager [phone]) For consults and floor pages call [phone](7A-4:30P)  10/21/2013 9:45 AM

## 2013-10-22 LAB — GLUCOSE, CAPILLARY
GLUCOSE-CAPILLARY: 179 mg/dL — AB (ref 70–99)
Glucose-Capillary: 158 mg/dL — ABNORMAL HIGH (ref 70–99)

## 2013-10-22 MED ORDER — ACETAMINOPHEN 325 MG PO TABS
650.0000 mg | ORAL_TABLET | Freq: Four times a day (QID) | ORAL | Status: DC | PRN
  Filled 2013-10-22: qty 2

## 2013-10-22 NOTE — Progress Notes (Signed)
Patient discharge teaching given, including activity, diet, follow-up appoints, and medications. Patient verbalized understanding of all discharge instructions. IV access was d/c'd. Vitals are stable. Skin is intact except as charted in most recent assessments. Pt to be transported to [facility] by ambulance

## 2013-10-22 NOTE — Discharge Summary (Signed)
  Discharge summary dictated on 10/22/2013 dictation [phone]

## 2013-10-22 NOTE — Discharge Summary (Signed)
Katherine [name], Katherine [name]                 ACCOUNT NO.:  [id number]  MEDICAL RECORD NO.:  [id number]  LOCATION:  5W17C                        FACILITY:  [facility]  PHYSICIAN:  Allegra Lai. Terrence Dupont, M.D. DATE OF BIRTH:  [birth year]  DATE OF ADMISSION:  10/15/2013 DATE OF DISCHARGE:  10/22/2013                              DISCHARGE SUMMARY   ADMITTING DIAGNOSES: 1. Acute small bowel obstruction. 2. Hypertension. 3. Diabetes mellitus. 4. Coronary artery disease. 5. History of cerebrovascular accident. 6. History of anemia. 7. Osteoarthritis. 8. Morbid obesity.  DISCHARGE DIAGNOSES: 1. Status post small bowel obstruction. 2. Generalized resolving abdominal pain probably secondary to ileus,     rule out gastritis. 3. Hypertension. 4. Diabetes mellitus. 5. Obesity. 6. History of cerebrovascular accident. 7. History of anemia. 8. Degenerative joint disease. 9. Coronary artery disease, stable. 10.History of IVC filter in the past. 11.Chronic venous insufficiency and history of deep venous thrombosis     in the past.  DISCHARGE HOME MEDICATIONS: 1. Atorvastatin 40 mg daily. 2. Humalog insulin 75/25 mix 55 units in a.m. and 35 units in p.m. as     before. 3. Omeprazole 40 mg daily. 4. Demadex 20 mg daily. 5. Potassium chloride 20 mEq daily. 6. Uloric 80 mg daily. 7. Valsartan 320 mg 1 daily. 8. Ambien 5 mg daily as needed at bedtime.  The patient has been     advised to stop fentanyl patch and lorazepam. 9. Continue her eye drops as before.  DIET:  Low salt, low cholesterol, 1800 calories ADA diet.  The patient has been advised to monitor blood sugar and blood pressure daily.  FOLLOWUP:  Follow up with Dr. Montez Morita in 1 week.  CONDITION AT DISCHARGE:  Stable.  BRIEF HISTORY AND HOSPITAL COURSE:  Katherine [name] is an 80 year old female with past medical history significant for multiple medical problems, i.e., coronary artery disease, hypertension, insulin-requiring diabetes mellitus,  hypercholesteremia, history of CVA in the past, chronic venous insufficiency, history of anemia, osteoarthritis, chronic leg swelling, history of questionable DVT status post IVC filter, history of multiple laparotomies, was admitted by Dr. Doylene Canard on October 15, 2013, because of cramping abdominal pain.  CT of the abdomen done in the ED showed small bowel obstruction with small amount of free fluid along the margin of the liver.  The patient denies any fever, rectal bleeds.  PAST MEDICAL HISTORY:  As above.  PHYSICAL EXAMINATION:  GENERAL:  She was alert, awake, oriented x3. VITAL SIGNS:  Blood pressure was 173/79, pulse was 78, she was afebrile. EYES:  Conjunctivae was pink. NECK:  Supple.  No JVD. LUNGS:  Clear to auscultation bilaterally. CARDIOVASCULAR:  S1, S2 was normal.  There was 2/6 systolic murmur. ABDOMEN:  Distended.  Moderate generalized tenderness.  There was no guarding. EXTREMITIES:  There is no clubbing, cyanosis, or edema.  LABORATORY DATA:  Sodium was 144, potassium 3.6, BUN 11, creatinine 0.76, blood sugar was 237.  Lactic acid was 1.93.  Hemoglobin was 11.5, hematocrit 36.4, white count of 9.0.  Her hemoglobin A1c was high at 9.9.  X-ray showed no acute cardiopulmonary disease.  CT of the abdomen showed partial mechanical obstruction in the small bowel, small amount  of free fluid along the margin of the liver.  X-ray done on October 18, 2013, showed slight improvement in degree of partial small-bowel obstruction.  The patient subsequently underwent upper GI series with small bowel followthrough on October 18, 2013, which showed no abnormality of the stomach or small bowel, no evidence of small bowel obstruction.  BRIEF HISTORY AND HOSPITAL COURSE:  The patient was admitted to telemetry unit.  The patient was started on IV fluids and was treated conservatively with continuous NG suction to low [place] and was kept n.p.o. with improvement in her symptoms.  The  patient's abdominal pain gradually has resolved.  The patient's NG tube was discontinued and was started on clear liquids, which has been increased to a soft diet, which the patient is tolerating.  The patient did not have any episodes of vomiting during the hospital stay.  The patient is ambulating in room without any problems.  The patient's Duragesic patch has been discontinued.  The patient has been advised to refrain from any narcotics.  The patient will be discharged home on above medications, and will be followed up by Dr. Montez Morita in 1 week.     Allegra Lai. Terrence Dupont, M.D.    MNH/MEDQ  D:  10/22/2013  T:  10/22/2013  Job:  [id number]  cc:   Ardyth Gal. Spruill, M.D.

## 2013-10-22 NOTE — Discharge Instructions (Signed)
Ileus The intestine (bowel, or gut) is a long, muscular tube connecting your stomach to your rectum. If the intestine stops working, food cannot pass through. This is called an ileus. This can happen for a variety of reasons. Ileus is a major medical problem that usually requires hospitalization. If your intestine stops working because of a blockage, this is called a bowel obstruction and is a different condition. CAUSES   Surgery in your abdomen. This can last from a few hours to a few days.  An infection or inflammation in the belly (abdomen). This includes inflammation of the lining of the abdomen (peritonitis).  Infection or inflammation in other parts of the body, such as pneumonia or pancreatitis.  Passage of gallstones or kidney stones.  Damage to the nerves or blood vessels which go to the bowel.  Imbalance in the salts in the blood (electrolytes).  Injury to the brain and/or spinal cord.  Medications. Many medications can cause ileus or make it worse. The most common of these are strong pain medications. SYMPTOMS  Symptoms of bowel obstruction come from the bowel inactivity. They may include:  Bloating. Your belly gets bigger (distension).  Pain or discomfort in the abdomen.  Poor appetite, feeling sick to your stomach (nausea), and vomiting.  You may also not be able to hear your normal bowel sounds, such as "growling" in your stomach. DIAGNOSIS   Your history and a physical exam will usually suggest to your caregiver that you have an ileus.  X-rays or a CT scan of your abdomen will confirm the diagnosis. X-rays, CT scans, and lab tests may also suggest the cause. TREATMENT   Rest the intestine until it starts working again. This is most often accomplished by:  Stopping intake of oral food and drink. Dehydration is prevented by using IV (intravenous) fluids.  Sometimes, a nasogastric tube (NG tube) is needed. This is a narrow plastic tube inserted through your nose  and into your stomach. It is connected to suction to keep the stomach emptied out. This also helps treat the nausea and vomiting.  If there is an imbalance in the electrolytes, they are corrected with supplements in your intravenous fluids.  Medications that might make an ileus worse might be stopped.  There are no medications that reliably treat ileus, though your caregiver may suggest a trial of certain medications.  If your condition is slow to resolve, you will be reevaluated to be sure another condition, such as a blockage, is not present. Ileus is common and usually has a good outcome. Depending on the cause of your ileus, it usually can be treated by your caregivers with good results. Sometimes, specialists (surgeons or gastroenterologists) are asked to assist in your care.  HOME CARE INSTRUCTIONS   Follow your caregiver's instructions regarding diet and fluid intake. This will usually include drinking plenty of clear fluids, avoiding alcohol and caffeine, and eating a gentle diet.  Follow your caregiver's instructions regarding activity. A period of rest is sometimes advised before returning to work or school.  Take only medications prescribed by your caregiver. Be especially careful with narcotic pain medication, which can slow your bowel activity and contribute to ileus.  Keep any follow-up appointments with your caregiver or specialists. SEEK MEDICAL CARE IF:   You have a recurrence of nausea, vomiting, or abdominal discomfort.  You develop fever of more than 102 F (38.9 C). SEEK IMMEDIATE MEDICAL CARE IF:   You have severe abdominal pain.  You are unable to keep  fluids down. Document Released: 02/20/2003 Document Revised: 07/04/2013 Document Reviewed: 06/22/2008 [facility] Patient Information 2015 [place], [place]. This information is not intended to replace advice given to you by your health care provider. Make sure you discuss any questions you have with your health care  provider.

## 2013-10-22 NOTE — Clinical Social Work Note (Signed)
CSW made aware patient ready for d/c home and will require transportation assistance. CSW met with patient and verified address: [place]. CSW prepared transportation packet and placed in patient's shadow chart. CSW to arrange transportation via [facility]. No further needs. CSW signing off.  Crystal Patrick-Jefferson, LCSWA Weekend Clinical Social Worker [phone]  

## 2014-11-13 ENCOUNTER — Other Ambulatory Visit: Payer: Self-pay | Admitting: Gastroenterology

## 2014-11-14 ENCOUNTER — Encounter (HOSPITAL_COMMUNITY): Payer: Self-pay | Admitting: *Deleted

## 2014-11-16 NOTE — H&P (Signed)
  Katherine [name] HPI: The patient complains of right sided discomfort. This is a chronic issue for her. In the past she was evaluated for recurrent SBO from adhesions and IDA. The IDA work up was essentially negative. Currently she is on Fentanyl patches through a pain clinic and it seems to help.  Past Medical History  Diagnosis Date  . Bloody discharge from nipple   . Hypertension   . Anemia     mild  . Diabetes mellitus without complication     type 2  . Carotid artery stenosis   . Full dentures   . Wears glasses   . Stroke     right sided weakness,walker  . History of hiatal hernia   . Chronic kidney disease     renal insufficiency - DR Florene Glen yearly  . Mobility impaired     uses walker for ambulation    Past Surgical History  Procedure Laterality Date  . Cholecystectomy    . Appendectomy    . Eye surgery      right-glaucoma  . Abdominal hysterectomy    . Cataract extraction      left  . Dilation and curettage of uterus    . Tubal ligation    . Breast surgery      bx rt and lt   . Breast ductal system excision  03/02/2012    Procedure: EXCISION DUCTAL SYSTEM BREAST;  Surgeon: Adin Hector, MD;  Location: [facility];  Service: General;  Laterality: Left;  . Angiodysplasia    . Dvt    . Nausea & vomiting    . Rectal bleeding    . Abdominal pain, rlq    . Constipation    . Esophagogastroduodenoscopy N/A 09/10/2012    Procedure: ESOPHAGOGASTRODUODENOSCOPY (EGD);  Surgeon: Beryle Beams, MD;  Location: [facility];  Service: Endoscopy;  Laterality: N/A;  . Hot hemostasis N/A 09/10/2012    Procedure: HOT HEMOSTASIS (ARGON PLASMA COAGULATION/BICAP);  Surgeon: Beryle Beams, MD;  Location: [facility];  Service: Endoscopy;  Laterality: N/A;  . Colonoscopy N/A 10/15/2012    Procedure: COLONOSCOPY;  Surgeon: Beryle Beams, MD;  Location: [facility];  Service: Endoscopy;  Laterality: N/A;    History reviewed. No pertinent family history.  Social  History:  reports that she quit smoking about 12 years ago. She has never used smokeless tobacco. She reports that she does not drink alcohol or use illicit drugs.  Allergies:  Allergies  Allergen Reactions  . Aspirin Hives  . Blue Dyes (Parenteral) Hives  . Contrast Media [Iodinated Diagnostic Agents] Other (See Comments)    unknown  . Ibuprofen Hives  . Iohexol Other (See Comments)    unknown  . Shellfish-Derived Products Hives    Medications: Scheduled: Continuous:  No results found for this or any previous visit (from the past 24 hour(s)).   No results found.  ROS:  As stated above in the HPI otherwise negative.  There were no vitals taken for this visit.    PE: Gen: NAD, Alert and Oriented HEENT:  [place]/AT, EOMI Neck: Supple, no LAD Lungs: CTA Bilaterally CV: RRR without M/G/R ABM: Soft, NTND, +BS Ext: No C/C/E  Assessment/Plan: 1) Anemia and history of AVM - Enteroscopy with APC.  Tam Savoia D 11/16/2014, 11:04 AM

## 2014-11-17 ENCOUNTER — Encounter (HOSPITAL_COMMUNITY)
Admission: RE | Disposition: A | Discharge status: Home / Self Care | Payer: Self-pay | Source: Ambulatory Visit | Attending: Gastroenterology

## 2014-11-17 ENCOUNTER — Encounter (HOSPITAL_COMMUNITY): Payer: Self-pay | Admitting: *Deleted

## 2014-11-17 ENCOUNTER — Ambulatory Visit (HOSPITAL_COMMUNITY)
Admission: RE | Admit: 2014-11-17 | Discharge: 2014-11-17 | Disposition: A | Discharge status: Home / Self Care | Payer: Medicare Other | Source: Ambulatory Visit | Attending: Gastroenterology | Admitting: Gastroenterology

## 2014-11-17 ENCOUNTER — Ambulatory Visit (HOSPITAL_COMMUNITY): Payer: Medicare Other | Admitting: Anesthesiology

## 2014-11-17 DIAGNOSIS — E1151 Type 2 diabetes mellitus with diabetic peripheral angiopathy without gangrene: Secondary | ICD-10-CM | POA: Insufficient documentation

## 2014-11-17 DIAGNOSIS — Z87891 Personal history of nicotine dependence: Secondary | ICD-10-CM | POA: Diagnosis not present

## 2014-11-17 DIAGNOSIS — Z6832 Body mass index (BMI) 32.0-32.9, adult: Secondary | ICD-10-CM | POA: Insufficient documentation

## 2014-11-17 DIAGNOSIS — E1122 Type 2 diabetes mellitus with diabetic chronic kidney disease: Secondary | ICD-10-CM | POA: Diagnosis not present

## 2014-11-17 DIAGNOSIS — Q2733 Arteriovenous malformation of digestive system vessel: Secondary | ICD-10-CM | POA: Insufficient documentation

## 2014-11-17 DIAGNOSIS — E669 Obesity, unspecified: Secondary | ICD-10-CM | POA: Insufficient documentation

## 2014-11-17 DIAGNOSIS — D649 Anemia, unspecified: Secondary | ICD-10-CM | POA: Diagnosis not present

## 2014-11-17 DIAGNOSIS — Z86718 Personal history of other venous thrombosis and embolism: Secondary | ICD-10-CM | POA: Diagnosis not present

## 2014-11-17 DIAGNOSIS — N189 Chronic kidney disease, unspecified: Secondary | ICD-10-CM | POA: Insufficient documentation

## 2014-11-17 DIAGNOSIS — Z79891 Long term (current) use of opiate analgesic: Secondary | ICD-10-CM | POA: Insufficient documentation

## 2014-11-17 DIAGNOSIS — Z79899 Other long term (current) drug therapy: Secondary | ICD-10-CM | POA: Diagnosis not present

## 2014-11-17 DIAGNOSIS — I129 Hypertensive chronic kidney disease with stage 1 through stage 4 chronic kidney disease, or unspecified chronic kidney disease: Secondary | ICD-10-CM | POA: Insufficient documentation

## 2014-11-17 DIAGNOSIS — I69351 Hemiplegia and hemiparesis following cerebral infarction affecting right dominant side: Secondary | ICD-10-CM | POA: Diagnosis not present

## 2014-11-17 HISTORY — DX: Other reduced mobility: Z74.09

## 2014-11-17 HISTORY — PX: ENTEROSCOPY: SHX5533

## 2014-11-17 HISTORY — PX: HOT HEMOSTASIS: SHX5433

## 2014-11-17 HISTORY — DX: Chronic kidney disease, unspecified: N18.9

## 2014-11-17 HISTORY — DX: Personal history of other diseases of the digestive system: Z87.19

## 2014-11-17 SURGERY — ENTEROSCOPY
Anesthesia: Monitor Anesthesia Care

## 2014-11-17 MED ORDER — PROPOFOL 10 MG/ML IV BOLUS
INTRAVENOUS | Status: AC
  Filled 2014-11-17: qty 20

## 2014-11-17 MED ORDER — PROPOFOL INFUSION 10 MG/ML OPTIME
INTRAVENOUS | Status: DC | PRN
  Administered 2014-11-17: 150 ug/kg/min via INTRAVENOUS

## 2014-11-17 MED ORDER — PROPOFOL 10 MG/ML IV BOLUS
INTRAVENOUS | Status: DC | PRN
  Administered 2014-11-17 (×2): 20 mg via INTRAVENOUS

## 2014-11-17 MED ORDER — LACTATED RINGERS IV SOLN
INTRAVENOUS | Status: DC
  Administered 2014-11-17: 12:00:00 via INTRAVENOUS

## 2014-11-17 MED ORDER — SODIUM CHLORIDE 0.9 % IV SOLN: INTRAVENOUS | Status: DC

## 2014-11-17 NOTE — Interval H&P Note (Signed)
History and Physical Interval Note:  11/17/2014 11:42 AM  Katherine [name]  has presented today for surgery, with the diagnosis of anemia  The various methods of treatment have been discussed with the patient and family. After consideration of risks, benefits and other options for treatment, the patient has consented to  Procedure(s): ENTEROSCOPY (N/A) HOT HEMOSTASIS (ARGON PLASMA COAGULATION/BICAP) (N/A) as a surgical intervention .  The patient's history has been reviewed, patient examined, no change in status, stable for surgery.  I have reviewed the patient's chart and labs.  Questions were answered to the patient's satisfaction.     HUNG,PATRICK D

## 2014-11-17 NOTE — Op Note (Signed)
[facility] [place], [address]   ENTEROSCOPY PROCEDURE REPORT     EXAM DATE: 11/17/2014  PATIENT NAME:      Katherine [name], Katherine [name]           MR #:      [id number]  BIRTHDATE:       14-Sep-1930      VISIT #:     [id number]  ATTENDING:     Carol Ada, MD     STATUS:     outpatient ASSISTANT:      William Dalton and Doran Heater MD: ASA CLASS:        Class III  INDICATIONS:  The patient is a 81 yr old female here for an enteroscopy procedure due to anemia. PROCEDURE PERFORMED:     Small bowel enteroscopy with ablation therapy  MEDICATIONS:     Monitored anesthesia care  CONSENT: The patient understands the risks and benefits of the procedure and understands that these risks include, but are not limited to: sedation, allergic reaction, infection, perforation and/or bleeding. Alternative means of evaluation and treatment include, among others: physical exam, x-rays, and/or surgical intervention. The patient elects to proceed with this endoscopic procedure.  DESCRIPTION OF PROCEDURE: During intra-op preparation period all mechanical & medical equipment was checked for proper function. Hand hygiene and appropriate measures for infection prevention was taken. After the risks, benefits and alternatives of the procedure were thoroughly explained, Informed consent was verified, confirmed and timeout was successfully executed by the treatment team. The    endoscope was introduced through the mouth and advanced to the proximal jejunum jejunum. The prep was The overall prep quality was excellent.. The instrument was then slowly withdrawn while examining the mucosa circumferentially. The scope was then completely withdrawn from the patient and the procedure terminated. The pulse, BP, and O2 saturation were monitored and documented by the physician and the nursing staff throughout the entire procedure.  The patient was cared for as  planned according to standard protocol, then discharged to recovery in stable condition and with appropriate post procedure care. Estimated blood loss is zero unless otherwise noted in this procedure report.  FINDINGS: The esophagus and the stomach were normal.  Deep intubation into the proximal jejunum was achieved.  Three small nonbleeding AVMs were identified and treated wtih APC.  No evidence of any inflammation, ulcerations, erosions, polyps, or masses.     ADVERSE EVENTS:      There were no immediate complications.  IMPRESSIONS:     Three nonbleeding proximal small bowel AVMs s/p APC.  RECOMMENDATIONS:     1) Follow up in the office in one month to recheck CBC. RECALL:  _____________________________ Carol Ada, MD eSigned:  Carol Ada, MD 11/17/2014 1:05 PM   cc:     PATIENT NAME:  Katherine [name], Katherine [name] MR#: [id number]

## 2014-11-17 NOTE — Discharge Instructions (Signed)
Esophagogastroduodenoscopy °Care After °Refer to this sheet in the next few weeks. These instructions provide you with information on caring for yourself after your procedure. Your caregiver may also give you more specific instructions. Your treatment has been planned according to current medical practices, but problems sometimes occur. Call your caregiver if you have any problems or questions after your procedure.  °HOME CARE INSTRUCTIONS °· Do not eat or drink anything until the numbing medicine (local anesthetic) has worn off and your gag reflex has returned. You will know that the local anesthetic has worn off when you can swallow comfortably. °· Do not drive for 12 hours after the procedure or as directed by your caregiver. °· Only take medicines as directed by your caregiver. °SEEK MEDICAL CARE IF:  °· You cannot stop coughing. °· You are not urinating at all or less than usual. °SEEK IMMEDIATE MEDICAL CARE IF: °· You have difficulty swallowing. °· You cannot eat or drink. °· You have worsening throat or chest pain. °· You have dizziness, lightheadedness, or you faint. °· You have nausea or vomiting. °· You have chills. °· You have a fever. °· You have severe abdominal pain. °· You have black, tarry, or bloody stools. °Document Released: 02/04/2012 Document Reviewed: 02/04/2012 °[employer]® Patient Information ©2015 [employer]. This information is not intended to replace advice given to you by your health care provider. Make sure you discuss any questions you have with your health care provider. ° °

## 2014-11-17 NOTE — Anesthesia Postprocedure Evaluation (Signed)
  Anesthesia Post-op Note  Patient: Katherine [name]  Procedure(s) Performed: Procedure(s) (LRB): ENTEROSCOPY (N/A) HOT HEMOSTASIS (ARGON PLASMA COAGULATION/BICAP) (N/A)  Patient Location: PACU  Anesthesia Type: MAC  Level of Consciousness: awake and alert   Airway and Oxygen Therapy: Patient Spontanous Breathing  Post-op Pain: mild  Post-op Assessment: Post-op Vital signs reviewed, Patient's Cardiovascular Status Stable, Respiratory Function Stable, Patent Airway and No signs of Nausea or vomiting  Last Vitals:  Filed Vitals:   11/17/14 1310  BP: 117/35  Pulse: 73  Temp:   Resp: 25    Post-op Vital Signs: stable   Complications: No apparent anesthesia complications

## 2014-11-17 NOTE — Transfer of Care (Signed)
Immediate Anesthesia Transfer of Care Note  Patient: Katherine [name]  Procedure(s) Performed: Procedure(s): ENTEROSCOPY (N/A) HOT HEMOSTASIS (ARGON PLASMA COAGULATION/BICAP) (N/A)  Patient Location: PACU and Endoscopy Unit  Anesthesia Type:MAC  Level of Consciousness: awake and patient cooperative  Airway & Oxygen Therapy: Patient Spontanous Breathing and Patient connected to nasal cannula oxygen  Post-op Assessment: Report given to RN and Post -op Vital signs reviewed and stable  Post vital signs: Reviewed and stable  Last Vitals:  Filed Vitals:   11/17/14 1145  BP: 138/55  Temp: 36.9 C  Resp: 17    Complications: No apparent anesthesia complications

## 2014-11-17 NOTE — Anesthesia Preprocedure Evaluation (Addendum)
Anesthesia Evaluation  Patient identified by MRN, date of birth, ID band Patient awake    Reviewed: Allergy & Precautions, NPO status , Patient's Chart, lab work & pertinent test results  History of Anesthesia Complications Negative for: history of anesthetic complications  Airway Mallampati: II  TM Distance: >3 FB Neck ROM: Full    Dental no notable dental hx. (+) Dental Advisory Given, Upper Dentures, Lower Dentures   Pulmonary former smoker,    Pulmonary exam normal breath sounds clear to auscultation       Cardiovascular hypertension, Pt. on medications + Peripheral Vascular Disease  Normal cardiovascular exam Rhythm:Regular Rate:Normal  Echo 2003:  Overall left ventricular systolic function was normal. Left    ventricular ejection fraction was estimated , range being 55    % to 65 %. There were no left ventricular regional wall    motion abnormalities. - There was trivial aortic valvular regurgitation.    Neuro/Psych CVA negative psych ROS   GI/Hepatic negative GI ROS, Neg liver ROS,   Endo/Other  diabetes  Renal/GU Renal disease  negative genitourinary   Musculoskeletal negative musculoskeletal ROS (+)   Abdominal (+) + obese,   Peds negative pediatric ROS (+)  Hematology  (+) anemia ,   Anesthesia Other Findings   Reproductive/Obstetrics negative OB ROS                            Anesthesia Physical Anesthesia Plan  ASA: III  Anesthesia Plan: MAC   Post-op Pain Management:    Induction: Intravenous  Airway Management Planned: Nasal Cannula  Additional Equipment:   Intra-op Plan:   Post-operative Plan:   Informed Consent: I have reviewed the patients History and Physical, chart, labs and discussed the procedure including the risks, benefits and alternatives for the proposed anesthesia with the patient or authorized representative who has indicated  his/her understanding and acceptance.   Dental advisory given  Plan Discussed with: CRNA  Anesthesia Plan Comments:        Anesthesia Quick Evaluation

## 2014-11-20 ENCOUNTER — Encounter (HOSPITAL_COMMUNITY): Payer: Self-pay | Admitting: Gastroenterology

## 2014-11-20 LAB — GLUCOSE, CAPILLARY: Glucose-Capillary: 75 mg/dL (ref 65–99)

## 2015-01-10 ENCOUNTER — Other Ambulatory Visit: Payer: Self-pay | Admitting: Cardiology

## 2015-01-10 DIAGNOSIS — N6310 Unspecified lump in the right breast, unspecified quadrant: Secondary | ICD-10-CM

## 2015-01-16 ENCOUNTER — Ambulatory Visit
Admission: RE | Admit: 2015-01-16 | Discharge: 2015-01-16 | Disposition: A | Discharge status: Home / Self Care | Payer: Medicare Other | Source: Ambulatory Visit | Attending: Cardiology | Admitting: Cardiology

## 2015-01-16 DIAGNOSIS — N6310 Unspecified lump in the right breast, unspecified quadrant: Secondary | ICD-10-CM

## 2015-07-20 ENCOUNTER — Other Ambulatory Visit: Payer: Self-pay | Admitting: Cardiology

## 2015-07-20 DIAGNOSIS — R0789 Other chest pain: Secondary | ICD-10-CM

## 2015-07-25 ENCOUNTER — Encounter (HOSPITAL_COMMUNITY)
Admission: RE | Admit: 2015-07-25 | Discharge: 2015-07-25 | Disposition: A | Discharge status: Home / Self Care | Payer: Medicare Other | Source: Ambulatory Visit | Attending: Cardiology | Admitting: Cardiology

## 2015-07-25 ENCOUNTER — Ambulatory Visit (HOSPITAL_COMMUNITY)
Admission: RE | Admit: 2015-07-25 | Discharge: 2015-07-25 | Disposition: A | Discharge status: Home / Self Care | Payer: Medicare Other | Source: Ambulatory Visit | Attending: Cardiology | Admitting: Cardiology

## 2015-07-25 DIAGNOSIS — R079 Chest pain, unspecified: Secondary | ICD-10-CM | POA: Insufficient documentation

## 2015-07-25 DIAGNOSIS — R0789 Other chest pain: Secondary | ICD-10-CM

## 2015-07-25 MED ORDER — TECHNETIUM TC 99M TETROFOSMIN IV KIT
30.0000 | PACK | Freq: Once | INTRAVENOUS | Status: AC | PRN
  Administered 2015-07-25: 30 via INTRAVENOUS

## 2015-07-25 MED ORDER — REGADENOSON 0.4 MG/5ML IV SOLN
0.4000 mg | Freq: Once | INTRAVENOUS | Status: AC
  Administered 2015-07-25: 0.4 mg via INTRAVENOUS

## 2015-07-25 MED ORDER — TECHNETIUM TC 99M TETROFOSMIN IV KIT
10.0000 | PACK | Freq: Once | INTRAVENOUS | Status: AC | PRN
  Administered 2015-07-25: 10 via INTRAVENOUS

## 2015-07-25 MED ORDER — REGADENOSON 0.4 MG/5ML IV SOLN
INTRAVENOUS | Status: AC
  Filled 2015-07-25: qty 5

## 2015-11-14 ENCOUNTER — Other Ambulatory Visit (HOSPITAL_COMMUNITY): Payer: Self-pay | Admitting: Cardiology

## 2015-11-14 ENCOUNTER — Other Ambulatory Visit: Payer: Self-pay | Admitting: Cardiology

## 2015-11-14 DIAGNOSIS — E2839 Other primary ovarian failure: Secondary | ICD-10-CM

## 2015-11-14 DIAGNOSIS — R208 Other disturbances of skin sensation: Secondary | ICD-10-CM

## 2015-11-14 DIAGNOSIS — I6529 Occlusion and stenosis of unspecified carotid artery: Secondary | ICD-10-CM

## 2015-11-19 ENCOUNTER — Ambulatory Visit (HOSPITAL_COMMUNITY)
Admission: RE | Admit: 2015-11-19 | Discharge: 2015-11-19 | Disposition: A | Discharge status: Home / Self Care | Payer: Medicare Other | Source: Ambulatory Visit | Attending: Cardiology | Admitting: Cardiology

## 2015-11-19 ENCOUNTER — Ambulatory Visit (HOSPITAL_BASED_OUTPATIENT_CLINIC_OR_DEPARTMENT_OTHER)
Admission: RE | Admit: 2015-11-19 | Discharge: 2015-11-19 | Disposition: A | Discharge status: Home / Self Care | Payer: Medicare Other | Source: Ambulatory Visit

## 2015-11-19 DIAGNOSIS — E2839 Other primary ovarian failure: Secondary | ICD-10-CM | POA: Insufficient documentation

## 2015-11-19 DIAGNOSIS — I25119 Atherosclerotic heart disease of native coronary artery with unspecified angina pectoris: Secondary | ICD-10-CM | POA: Diagnosis not present

## 2015-11-19 DIAGNOSIS — R208 Other disturbances of skin sensation: Secondary | ICD-10-CM

## 2015-11-19 DIAGNOSIS — I6529 Occlusion and stenosis of unspecified carotid artery: Secondary | ICD-10-CM | POA: Diagnosis not present

## 2015-11-19 DIAGNOSIS — I6523 Occlusion and stenosis of bilateral carotid arteries: Secondary | ICD-10-CM | POA: Insufficient documentation

## 2015-11-19 NOTE — Progress Notes (Signed)
VASCULAR LAB PRELIMINARY  ARTERIAL  ABI completed: within normal limits.    RIGHT    LEFT    PRESSURE WAVEFORM  PRESSURE WAVEFORM  BRACHIAL 135 Tri BRACHIAL 147 Tri  DP   DP    AT 146 Tri AT 181 Tri  PT 143 Bi PT 154 Tri  PER   PER    GREAT TOE  NA GREAT TOE  NA    RIGHT LEFT  ABI 0.99 1.23    Landry Mellow, RDMS, RVT   11/19/2015, 3:27 PM

## 2015-11-19 NOTE — Progress Notes (Signed)
*  PRELIMINARY RESULTS* Vascular Ultrasound Carotid Duplex (Doppler) has been completed.  Preliminary findings: Findings consistent with 40-59% stenosis involving the right internal carotid artery, based on diastolic velocity. However, based on systolic velocity and ratio, stenosis is in 60-79% range.  Findings consistent with 1-39% left internal carotid artery stenosis.     Landry Mellow, RDMS, RVT  11/19/2015, 3:23 PM

## 2015-11-21 LAB — VAS US CAROTID
LCCADDIAS: 12 cm/s
LCCADSYS: 73 cm/s
LCCAPDIAS: 16 cm/s
LCCAPSYS: 103 cm/s
LEFT ECA DIAS: -10 cm/s
LEFT VERTEBRAL DIAS: 15 cm/s
LICADSYS: -74 cm/s
Left ICA dist dias: -13 cm/s
Left ICA prox dias: -9 cm/s
Left ICA prox sys: -39 cm/s
RCCADSYS: -67 cm/s
RCCAPDIAS: 17 cm/s
RCCAPSYS: 91 cm/s
RIGHT ECA DIAS: -12 cm/s
RIGHT VERTEBRAL DIAS: 10 cm/s

## 2015-11-22 ENCOUNTER — Ambulatory Visit
Admission: RE | Admit: 2015-11-22 | Discharge: 2015-11-22 | Disposition: A | Discharge status: Home / Self Care | Payer: Medicare Other | Source: Ambulatory Visit | Attending: Cardiology | Admitting: Cardiology

## 2015-11-22 DIAGNOSIS — E2839 Other primary ovarian failure: Secondary | ICD-10-CM

## 2015-12-20 ENCOUNTER — Inpatient Hospital Stay (HOSPITAL_COMMUNITY)
Admission: EM | Admit: 2015-12-20 | Discharge: 2015-12-23 | DRG: 303 | Disposition: A | Discharge status: Home / Self Care | Payer: Medicare Other | Attending: Cardiology | Admitting: Cardiology

## 2015-12-20 DIAGNOSIS — D649 Anemia, unspecified: Secondary | ICD-10-CM | POA: Diagnosis present

## 2015-12-20 DIAGNOSIS — Z87891 Personal history of nicotine dependence: Secondary | ICD-10-CM

## 2015-12-20 DIAGNOSIS — Z972 Presence of dental prosthetic device (complete) (partial): Secondary | ICD-10-CM

## 2015-12-20 DIAGNOSIS — R011 Cardiac murmur, unspecified: Secondary | ICD-10-CM | POA: Diagnosis present

## 2015-12-20 DIAGNOSIS — Z9842 Cataract extraction status, left eye: Secondary | ICD-10-CM

## 2015-12-20 DIAGNOSIS — Z79899 Other long term (current) drug therapy: Secondary | ICD-10-CM

## 2015-12-20 DIAGNOSIS — Z86718 Personal history of other venous thrombosis and embolism: Secondary | ICD-10-CM

## 2015-12-20 DIAGNOSIS — Z91041 Radiographic dye allergy status: Secondary | ICD-10-CM

## 2015-12-20 DIAGNOSIS — I25118 Atherosclerotic heart disease of native coronary artery with other forms of angina pectoris: Principal | ICD-10-CM | POA: Diagnosis present

## 2015-12-20 DIAGNOSIS — Z886 Allergy status to analgesic agent status: Secondary | ICD-10-CM

## 2015-12-20 DIAGNOSIS — Z91013 Allergy to seafood: Secondary | ICD-10-CM

## 2015-12-20 DIAGNOSIS — R079 Chest pain, unspecified: Secondary | ICD-10-CM | POA: Diagnosis present

## 2015-12-20 DIAGNOSIS — N189 Chronic kidney disease, unspecified: Secondary | ICD-10-CM | POA: Diagnosis present

## 2015-12-20 DIAGNOSIS — I249 Acute ischemic heart disease, unspecified: Secondary | ICD-10-CM | POA: Diagnosis present

## 2015-12-20 DIAGNOSIS — H81399 Other peripheral vertigo, unspecified ear: Secondary | ICD-10-CM | POA: Diagnosis not present

## 2015-12-20 DIAGNOSIS — E1122 Type 2 diabetes mellitus with diabetic chronic kidney disease: Secondary | ICD-10-CM | POA: Diagnosis present

## 2015-12-20 DIAGNOSIS — E785 Hyperlipidemia, unspecified: Secondary | ICD-10-CM | POA: Diagnosis present

## 2015-12-20 DIAGNOSIS — I872 Venous insufficiency (chronic) (peripheral): Secondary | ICD-10-CM | POA: Diagnosis present

## 2015-12-20 DIAGNOSIS — I6529 Occlusion and stenosis of unspecified carotid artery: Secondary | ICD-10-CM | POA: Diagnosis present

## 2015-12-20 DIAGNOSIS — Z973 Presence of spectacles and contact lenses: Secondary | ICD-10-CM

## 2015-12-20 DIAGNOSIS — E1140 Type 2 diabetes mellitus with diabetic neuropathy, unspecified: Secondary | ICD-10-CM | POA: Diagnosis present

## 2015-12-20 DIAGNOSIS — Z888 Allergy status to other drugs, medicaments and biological substances status: Secondary | ICD-10-CM

## 2015-12-20 DIAGNOSIS — Z90710 Acquired absence of both cervix and uterus: Secondary | ICD-10-CM

## 2015-12-20 DIAGNOSIS — I129 Hypertensive chronic kidney disease with stage 1 through stage 4 chronic kidney disease, or unspecified chronic kidney disease: Secondary | ICD-10-CM | POA: Diagnosis present

## 2015-12-20 DIAGNOSIS — K219 Gastro-esophageal reflux disease without esophagitis: Secondary | ICD-10-CM | POA: Diagnosis present

## 2015-12-20 DIAGNOSIS — Z794 Long term (current) use of insulin: Secondary | ICD-10-CM

## 2015-12-20 DIAGNOSIS — I69841 Monoplegia of lower limb following other cerebrovascular disease affecting right dominant side: Secondary | ICD-10-CM

## 2015-12-20 DIAGNOSIS — Z91048 Other nonmedicinal substance allergy status: Secondary | ICD-10-CM

## 2015-12-20 LAB — CBC WITH DIFFERENTIAL/PLATELET
BASOS PCT: 1 %
Basophils Absolute: 0 10*3/uL (ref 0.0–0.1)
EOS ABS: 0.2 10*3/uL (ref 0.0–0.7)
EOS PCT: 4 %
HCT: 41.4 % (ref 36.0–46.0)
HEMOGLOBIN: 13.3 g/dL (ref 12.0–15.0)
LYMPHS ABS: 2.3 10*3/uL (ref 0.7–4.0)
Lymphocytes Relative: 41 %
MCH: 27.4 pg (ref 26.0–34.0)
MCHC: 32.1 g/dL (ref 30.0–36.0)
MCV: 85.4 fL (ref 78.0–100.0)
MONOS PCT: 8 %
Monocytes Absolute: 0.4 10*3/uL (ref 0.1–1.0)
NEUTROS PCT: 47 %
Neutro Abs: 2.6 10*3/uL (ref 1.7–7.7)
PLATELETS: 248 10*3/uL (ref 150–400)
RBC: 4.85 MIL/uL (ref 3.87–5.11)
RDW: 14.4 % (ref 11.5–15.5)
WBC: 5.6 10*3/uL (ref 4.0–10.5)

## 2015-12-20 LAB — BASIC METABOLIC PANEL
Anion gap: 8 (ref 5–15)
BUN: 12 mg/dL (ref 6–20)
CALCIUM: 9.9 mg/dL (ref 8.9–10.3)
CO2: 25 mmol/L (ref 22–32)
CREATININE: 0.84 mg/dL (ref 0.44–1.00)
Chloride: 105 mmol/L (ref 101–111)
Glucose, Bld: 227 mg/dL — ABNORMAL HIGH (ref 65–99)
Potassium: 4.5 mmol/L (ref 3.5–5.1)
SODIUM: 138 mmol/L (ref 135–145)

## 2015-12-20 LAB — TROPONIN I

## 2015-12-20 MED ORDER — NITROGLYCERIN 0.4 MG SL SUBL
0.4000 mg | SUBLINGUAL_TABLET | SUBLINGUAL | Status: DC | PRN
  Administered 2015-12-20: 0.4 mg via SUBLINGUAL

## 2015-12-20 MED ORDER — MORPHINE SULFATE (PF) 4 MG/ML IV SOLN
4.0000 mg | Freq: Once | INTRAVENOUS | Status: AC
  Administered 2015-12-20: 4 mg via INTRAVENOUS
  Filled 2015-12-20: qty 1

## 2015-12-20 MED ORDER — NITROGLYCERIN 0.4 MG SL SUBL
0.4000 mg | SUBLINGUAL_TABLET | SUBLINGUAL | Status: DC | PRN
Start: 2015-12-20 — End: 2015-12-23
  Administered 2015-12-20: 0.4 mg via SUBLINGUAL
  Filled 2015-12-20 (×2): qty 1

## 2015-12-20 MED ORDER — MECLIZINE HCL 25 MG PO TABS
25.0000 mg | ORAL_TABLET | Freq: Once | ORAL | Status: AC
  Administered 2015-12-20: 25 mg via ORAL
  Filled 2015-12-20: qty 1

## 2015-12-20 MED ORDER — ONDANSETRON HCL 4 MG/2ML IJ SOLN
4.0000 mg | Freq: Once | INTRAMUSCULAR | Status: AC
  Administered 2015-12-20: 4 mg via INTRAVENOUS
  Filled 2015-12-20: qty 2

## 2015-12-20 NOTE — ED Provider Notes (Signed)
[place] DEPT Provider Note   CSN: [id number] Arrival date & time: 12/20/15  1622     History   Chief Complaint Chief Complaint  Patient presents with  . Chest Pain  . Dizziness  . Nausea    HPI Katherine [name] is a 83 y.o. female.  She has a history of diabetes, renal insufficiency, hypertension, stroke, carotid artery stenosis. At 4:20 AM, she woke up and noted that she was dizzy and was unable to get herself out of bed. She had to go the bathroom, and she did manage to get to the bathroom but she felt like she was going to fall. There was a sense of things spinning and some associated nausea. She also noted a heavy feeling in the left side of her chest. There is no dyspnea but there was nausea and sweating. Symptoms have persisted through the day. She tried to tough it out, but symptoms were not improving. Dizziness is worse with any type of movement. She has not had similar problems before.   The history is provided by the patient.  Chest Pain   Associated symptoms include dizziness.  Dizziness  Associated symptoms: chest pain     Past Medical History:  Diagnosis Date  . Anemia    mild  . Bloody discharge from nipple   . Carotid artery stenosis   . Chronic kidney disease    renal insufficiency - DR Florene Glen yearly  . Diabetes mellitus without complication    type 2  . Full dentures   . History of hiatal hernia   . Hypertension   . Mobility impaired    uses walker for ambulation  . Stroke    right sided weakness,walker  . Wears glasses     Patient Active Problem List   Diagnosis Date Noted  . Bowel obstruction 10/15/2013  . Angioedema due to seafood allergy 09/11/2013  . Nipple discharge, bloody 01/07/2012    Past Surgical History:  Procedure Laterality Date  . ABDOMINAL HYSTERECTOMY    . abdominal pain, RLQ    . angiodysplasia    . APPENDECTOMY    . BREAST DUCTAL SYSTEM EXCISION  03/02/2012   Procedure: EXCISION DUCTAL SYSTEM BREAST;  Surgeon:  Adin Hector, MD;  Location: [place];  Service: General;  Laterality: Left;  . BREAST SURGERY     bx rt and lt   . CATARACT EXTRACTION     left  . CHOLECYSTECTOMY    . COLONOSCOPY N/A 10/15/2012   Procedure: COLONOSCOPY;  Surgeon: Beryle Beams, MD;  Location: [facility];  Service: Endoscopy;  Laterality: N/A;  . constipation    . DILATION AND CURETTAGE OF UTERUS    . DVT    . ENTEROSCOPY N/A 11/17/2014   Procedure: ENTEROSCOPY;  Surgeon: Carol Ada, MD;  Location: [facility];  Service: Endoscopy;  Laterality: N/A;  . ESOPHAGOGASTRODUODENOSCOPY N/A 09/10/2012   Procedure: ESOPHAGOGASTRODUODENOSCOPY (EGD);  Surgeon: Beryle Beams, MD;  Location: [facility];  Service: Endoscopy;  Laterality: N/A;  . EYE SURGERY     right-glaucoma  . HOT HEMOSTASIS N/A 09/10/2012   Procedure: HOT HEMOSTASIS (ARGON PLASMA COAGULATION/BICAP);  Surgeon: Beryle Beams, MD;  Location: [facility];  Service: Endoscopy;  Laterality: N/A;  . HOT HEMOSTASIS N/A 11/17/2014   Procedure: HOT HEMOSTASIS (ARGON PLASMA COAGULATION/BICAP);  Surgeon: Carol Ada, MD;  Location: [facility];  Service: Endoscopy;  Laterality: N/A;  . Nausea & Vomiting    . rectal bleeding    .  TUBAL LIGATION      OB History    No data available       Home Medications    Prior to Admission medications   Medication Sig Start Date End Date Taking? Authorizing Provider  atorvastatin (LIPITOR) 40 MG tablet Take 40 mg by mouth daily.    Historical Provider, MD  bimatoprost (LUMIGAN) 0.01 % SOLN Place 1 drop into both eyes at bedtime.    Historical Provider, MD  brimonidine (ALPHAGAN P) 0.1 % SOLN Place 1 drop into both eyes every 8 (eight) hours.     Historical Provider, MD  dorzolamide (TRUSOPT) 2 % ophthalmic solution Place 1 drop into both eyes 2 (two) times daily.    Historical Provider, MD  Febuxostat (ULORIC) 80 MG TABS Take 80 mg by mouth daily.    Historical Provider, MD  insulin lispro  protamine-lispro (HUMALOG 75/25 MIX) (75-25) 100 UNIT/ML SUSP injection Inject 35-55 Units into the skin 2 (two) times daily. 55 units AM, 35 units PM    Historical Provider, MD  omeprazole (PRILOSEC) 40 MG capsule Take 40 mg by mouth 2 (two) times daily.    Historical Provider, MD  pilocarpine (PILOCAR) 4 % ophthalmic solution Place 1 drop into both eyes 2 (two) times daily.    Historical Provider, MD  potassium chloride SA (K-DUR,KLOR-CON) 20 MEQ tablet Take 20 mEq by mouth daily.    Historical Provider, MD  torsemide (DEMADEX) 20 MG tablet Take 20 mg by mouth daily.    Historical Provider, MD  valsartan (DIOVAN) 320 MG tablet Take 320 mg by mouth daily.    Historical Provider, MD  zolpidem (AMBIEN) 5 MG tablet Take 5 mg by mouth at bedtime as needed for sleep.     Historical Provider, MD    Family History No family history on file.  Social History Social History  Substance Use Topics  . Smoking status: Former Smoker    Quit date: 02/25/2002  . Smokeless tobacco: Never Used  . Alcohol use No     Allergies   Aspirin; Blue dyes (parenteral); Contrast media [iodinated diagnostic agents]; Ibuprofen; Iohexol; and Shellfish-derived products   Review of Systems Review of Systems  Cardiovascular: Positive for chest pain.  Neurological: Positive for dizziness.  All other systems reviewed and are negative.    Physical Exam Updated Vital Signs BP 143/65 (BP Location: Right Arm)   Pulse 61   Temp 99 F (37.2 C)   Resp 18   Ht 5\' 6"  (1.676 m)   Wt 210 lb (95.3 kg)   SpO2 98% Comment: Simultaneous filing. User may not have seen previous data.  BMI 33.89 kg/m   Physical Exam  Nursing note and vitals reviewed.  82 year old female, resting comfortably and in no acute distress. Vital signs are Significant for mild hypertension. Oxygen saturation is 98%, which is normal. Head is normocephalic and atraumatic. PERRLA, EOMI. Oropharynx is clear. Neck is nontender and supple without  adenopathy or JVD. There are no carotid bruits. Back is nontender and there is no CVA tenderness. Lungs are clear without rales, wheezes, or rhonchi. Chest is nontender. Heart has regular rate and rhythm with 2/6 systolic ejection murmur heard along the left sternal border. Abdomen is soft, flat, nontender without masses or hepatosplenomegaly and peristalsis is normoactive. Extremities have no cyanosis or edema, full range of motion is present. Skin is warm and dry without rash. Neurologic: Mental status is normal, cranial nerves are intact. There is mild right hemiparesis. No nystagmus  is present. Dizziness is reproduced by EOM testing and passive head movement.  ED Treatments / Results  Labs (all labs ordered are listed, but only abnormal results are displayed) Labs Reviewed  BASIC METABOLIC PANEL - Abnormal; Notable for the following:       Result Value   Glucose, Bld 227 (*)    All other components within normal limits  CBC WITH DIFFERENTIAL/PLATELET  TROPONIN I    EKG  EKG Interpretation  Date/Time:  Thursday December 20 2015 16:28:37 EDT Ventricular Rate:  62 PR Interval:    QRS Duration: 81 QT Interval:  399 QTC Calculation: 406 R Axis:   5 Text Interpretation:  Sinus rhythm Low voltage, precordial leads No significant change since last tracing Confirmed by Winfred Leeds  MD, SAM [phone]) on 12/20/2015 4:40:11 PM       EKG Interpretation  Date/Time:  Thursday December 20 2015 22:44:21 EDT Ventricular Rate:  60 PR Interval:    QRS Duration: 81 QT Interval:  419 QTC Calculation: 419 R Axis:   6 Text Interpretation:  Sinus rhythm Low voltage, precordial leads Abnormal R-wave progression, early transition When compared with ECG of EARLIER SAME DATE No significant change was found Confirmed by [facility]  MD, Anicka Stuckert ([id number]) on 12/20/2015 10:56:05 PM       Procedures Procedures (including critical care time) CRITICAL CARE Performed by: [id number] Total critical care time:  35 minutes Critical care time was exclusive of separately billable procedures and treating other patients. Critical care was necessary to treat or prevent imminent or life-threatening deterioration. Critical care was time spent personally by me on the following activities: development of treatment plan with patient and/or surrogate as well as nursing, discussions with consultants, evaluation of patient's response to treatment, examination of patient, obtaining history from patient or surrogate, ordering and performing treatments and interventions, ordering and review of laboratory studies, ordering and review of radiographic studies, pulse oximetry and re-evaluation of patient's condition.   Medications Ordered in ED Medications  ondansetron (ZOFRAN) injection 4 mg (not administered)  meclizine (ANTIVERT) tablet 25 mg (not administered)  nitroGLYCERIN (NITROSTAT) SL tablet 0.4 mg (not administered)     Initial Impression / Assessment and Plan / ED Course  I have reviewed the triage vital signs and the nursing notes.  Pertinent labs & imaging results that were available during my care of the patient were reviewed by me and considered in my medical decision making (see chart for details).  Clinical Course   Dizziness appears to be peripheral vertigo. Chest discomfort worrisome for coronary artery disease. Patient has known atherosclerosis with prior strokes. Old records are reviewed, and he cannot see any cardiac evaluations but she did have recent carotid duplex scanning showing significant bilateral carotid artery stenosis. She is aspirin allergic, so she will not be given aspirin. She is given nitroglycerin and meclizine. Initial ECG shows no acute changes.  She has significant relief of discomfort with nitroglycerin and lateral relief of dizziness with meclizine. However, chest pain recurred. She's given a second dose of nitroglycerin with less relief. ECG is repeated showing no new changes.  She is given morphine for pain. Case is discussed with Dr. Terrence Dupont, on call for Dr. Montez Morita, who agrees to admit the patient. He requests that she be placed on heparin and nitroglycerin drips and this is done.  Final Clinical Impressions(s) / ED Diagnoses   Final diagnoses:  Chest pain, unspecified type  Peripheral vertigo, unspecified laterality    New Prescriptions New Prescriptions   No  medications on file     Delora Fuel, MD [phone] [phone]

## 2015-12-20 NOTE — ED Triage Notes (Signed)
Pt presents to the ed with ems. She woke up at 0400 and started having dizziness that is worse with movement, nausea and chest pain a 3/10. She is a diabetic and blood sugar was 353, she has a history of cva and has some mild right sided weakness, she refused aspirin from ems because it irritates her stomach. Alert and oriented.

## 2015-12-20 NOTE — ED Notes (Signed)
Lab came out of room unable to collect blood samples consult placed for IVT

## 2015-12-20 NOTE — ED Notes (Signed)
Pt post Nitro still says she is having pain describes it like gas pain as though she wants to throw up ER md to room

## 2015-12-21 ENCOUNTER — Encounter (HOSPITAL_COMMUNITY): Payer: Self-pay

## 2015-12-21 DIAGNOSIS — Z87891 Personal history of nicotine dependence: Secondary | ICD-10-CM | POA: Diagnosis not present

## 2015-12-21 DIAGNOSIS — Z794 Long term (current) use of insulin: Secondary | ICD-10-CM | POA: Diagnosis not present

## 2015-12-21 DIAGNOSIS — Z888 Allergy status to other drugs, medicaments and biological substances status: Secondary | ICD-10-CM | POA: Diagnosis not present

## 2015-12-21 DIAGNOSIS — Z9842 Cataract extraction status, left eye: Secondary | ICD-10-CM | POA: Diagnosis not present

## 2015-12-21 DIAGNOSIS — E1122 Type 2 diabetes mellitus with diabetic chronic kidney disease: Secondary | ICD-10-CM | POA: Diagnosis present

## 2015-12-21 DIAGNOSIS — Z86718 Personal history of other venous thrombosis and embolism: Secondary | ICD-10-CM | POA: Diagnosis not present

## 2015-12-21 DIAGNOSIS — N189 Chronic kidney disease, unspecified: Secondary | ICD-10-CM | POA: Diagnosis present

## 2015-12-21 DIAGNOSIS — Z90710 Acquired absence of both cervix and uterus: Secondary | ICD-10-CM | POA: Diagnosis not present

## 2015-12-21 DIAGNOSIS — I6529 Occlusion and stenosis of unspecified carotid artery: Secondary | ICD-10-CM | POA: Diagnosis present

## 2015-12-21 DIAGNOSIS — I129 Hypertensive chronic kidney disease with stage 1 through stage 4 chronic kidney disease, or unspecified chronic kidney disease: Secondary | ICD-10-CM | POA: Diagnosis present

## 2015-12-21 DIAGNOSIS — I69841 Monoplegia of lower limb following other cerebrovascular disease affecting right dominant side: Secondary | ICD-10-CM | POA: Diagnosis not present

## 2015-12-21 DIAGNOSIS — Z91041 Radiographic dye allergy status: Secondary | ICD-10-CM | POA: Diagnosis not present

## 2015-12-21 DIAGNOSIS — Z973 Presence of spectacles and contact lenses: Secondary | ICD-10-CM | POA: Diagnosis not present

## 2015-12-21 DIAGNOSIS — E1140 Type 2 diabetes mellitus with diabetic neuropathy, unspecified: Secondary | ICD-10-CM | POA: Diagnosis present

## 2015-12-21 DIAGNOSIS — I25118 Atherosclerotic heart disease of native coronary artery with other forms of angina pectoris: Secondary | ICD-10-CM | POA: Diagnosis present

## 2015-12-21 DIAGNOSIS — H81399 Other peripheral vertigo, unspecified ear: Secondary | ICD-10-CM | POA: Diagnosis present

## 2015-12-21 DIAGNOSIS — Z91048 Other nonmedicinal substance allergy status: Secondary | ICD-10-CM | POA: Diagnosis not present

## 2015-12-21 DIAGNOSIS — Z886 Allergy status to analgesic agent status: Secondary | ICD-10-CM | POA: Diagnosis not present

## 2015-12-21 DIAGNOSIS — R079 Chest pain, unspecified: Secondary | ICD-10-CM | POA: Diagnosis present

## 2015-12-21 DIAGNOSIS — Z91013 Allergy to seafood: Secondary | ICD-10-CM | POA: Diagnosis not present

## 2015-12-21 DIAGNOSIS — Z972 Presence of dental prosthetic device (complete) (partial): Secondary | ICD-10-CM | POA: Diagnosis not present

## 2015-12-21 DIAGNOSIS — I872 Venous insufficiency (chronic) (peripheral): Secondary | ICD-10-CM | POA: Diagnosis present

## 2015-12-21 DIAGNOSIS — E785 Hyperlipidemia, unspecified: Secondary | ICD-10-CM | POA: Diagnosis present

## 2015-12-21 DIAGNOSIS — R011 Cardiac murmur, unspecified: Secondary | ICD-10-CM | POA: Diagnosis present

## 2015-12-21 DIAGNOSIS — K219 Gastro-esophageal reflux disease without esophagitis: Secondary | ICD-10-CM | POA: Diagnosis present

## 2015-12-21 DIAGNOSIS — D649 Anemia, unspecified: Secondary | ICD-10-CM | POA: Diagnosis present

## 2015-12-21 DIAGNOSIS — I249 Acute ischemic heart disease, unspecified: Secondary | ICD-10-CM | POA: Diagnosis present

## 2015-12-21 LAB — GLUCOSE, CAPILLARY
GLUCOSE-CAPILLARY: 203 mg/dL — AB (ref 65–99)
GLUCOSE-CAPILLARY: 227 mg/dL — AB (ref 65–99)
GLUCOSE-CAPILLARY: 212 mg/dL — AB (ref 65–99)

## 2015-12-21 LAB — TROPONIN I
Troponin I: <0.03 ng/mL (ref <0.03)
Troponin I: <0.03 ng/mL (ref <0.03)

## 2015-12-21 LAB — HEPARIN LEVEL (UNFRACTIONATED)
Heparin Unfractionated: 0.92 IU/mL — ABNORMAL HIGH (ref 0.30–0.70)
Heparin Unfractionated: 0.61 IU/mL (ref 0.30–0.70)

## 2015-12-21 LAB — HEMOGLOBIN A1C
Hgb A1c MFr Bld: 10.2 % — ABNORMAL HIGH (ref 4.8–5.6)
Mean Plasma Glucose: 246 mg/dL

## 2015-12-21 LAB — MRSA PCR SCREENING: MRSA BY PCR: NEGATIVE

## 2015-12-21 MED ORDER — MORPHINE SULFATE (PF) 2 MG/ML IV SOLN
2.0000 mg | INTRAVENOUS | Status: DC | PRN
  Administered 2015-12-21 – 2015-12-23 (×8): 2 mg via INTRAVENOUS
  Filled 2015-12-21 (×8): qty 1

## 2015-12-21 MED ORDER — HEPARIN (PORCINE) IN NACL 100-0.45 UNIT/ML-% IJ SOLN
850.0000 [IU]/h | INTRAMUSCULAR | Status: DC
  Administered 2015-12-21: 850 [IU]/h via INTRAVENOUS
  Administered 2015-12-21: 1000 [IU]/h via INTRAVENOUS
  Administered 2015-12-22: 850 [IU]/h via INTRAVENOUS
  Filled 2015-12-21 (×4): qty 250

## 2015-12-21 MED ORDER — INSULIN ASPART 100 UNIT/ML ~~LOC~~ SOLN
0.0000 [IU] | Freq: Three times a day (TID) | SUBCUTANEOUS | Status: DC
  Administered 2015-12-21 (×2): 3 [IU] via SUBCUTANEOUS
  Administered 2015-12-22: 5 [IU] via SUBCUTANEOUS
  Administered 2015-12-22 – 2015-12-23 (×3): 2 [IU] via SUBCUTANEOUS
  Administered 2015-12-23: 3 [IU] via SUBCUTANEOUS

## 2015-12-21 MED ORDER — ONDANSETRON HCL 4 MG/2ML IJ SOLN: 4.0000 mg | Freq: Four times a day (QID) | INTRAMUSCULAR | Status: DC | PRN

## 2015-12-21 MED ORDER — NITROGLYCERIN IN D5W 200-5 MCG/ML-% IV SOLN
0.0000 ug/min | INTRAVENOUS | Status: DC
  Administered 2015-12-21: 5 ug/min via INTRAVENOUS

## 2015-12-21 MED ORDER — NITROGLYCERIN IN D5W 200-5 MCG/ML-% IV SOLN
0.0000 ug/min | Freq: Once | INTRAVENOUS | Status: AC
  Administered 2015-12-21: 5 ug/min via INTRAVENOUS
  Filled 2015-12-21: qty 250

## 2015-12-21 MED ORDER — LATANOPROST 0.005 % OP SOLN
1.0000 [drp] | Freq: Every day | OPHTHALMIC | Status: DC
  Administered 2015-12-22: 1 [drp] via OPHTHALMIC
  Filled 2015-12-21 (×2): qty 2.5

## 2015-12-21 MED ORDER — ATORVASTATIN CALCIUM 40 MG PO TABS
40.0000 mg | ORAL_TABLET | Freq: Every day | ORAL | Status: DC
  Administered 2015-12-21 – 2015-12-23 (×3): 40 mg via ORAL
  Filled 2015-12-21 (×3): qty 1

## 2015-12-21 MED ORDER — DORZOLAMIDE HCL 2 % OP SOLN
1.0000 [drp] | Freq: Two times a day (BID) | OPHTHALMIC | Status: DC
  Administered 2015-12-21 – 2015-12-23 (×5): 1 [drp] via OPHTHALMIC
  Filled 2015-12-21: qty 10

## 2015-12-21 MED ORDER — PILOCARPINE HCL 4 % OP SOLN
1.0000 [drp] | Freq: Two times a day (BID) | OPHTHALMIC | Status: DC
  Administered 2015-12-21 – 2015-12-23 (×5): 1 [drp] via OPHTHALMIC
  Filled 2015-12-21: qty 15

## 2015-12-21 MED ORDER — HEPARIN BOLUS VIA INFUSION
4000.0000 [IU] | Freq: Once | INTRAVENOUS | Status: AC
  Administered 2015-12-21: 4000 [IU] via INTRAVENOUS
  Filled 2015-12-21: qty 4000

## 2015-12-21 MED ORDER — ACETAMINOPHEN 325 MG PO TABS: 650.0000 mg | ORAL_TABLET | ORAL | Status: DC | PRN

## 2015-12-21 MED ORDER — CLOPIDOGREL BISULFATE 75 MG PO TABS
75.0000 mg | ORAL_TABLET | Freq: Every day | ORAL | Status: DC
  Administered 2015-12-21 – 2015-12-23 (×3): 75 mg via ORAL
  Filled 2015-12-21 (×3): qty 1

## 2015-12-21 MED ORDER — ZOLPIDEM TARTRATE 5 MG PO TABS
5.0000 mg | ORAL_TABLET | Freq: Every evening | ORAL | Status: DC | PRN
  Administered 2015-12-21: 5 mg via ORAL
  Filled 2015-12-21: qty 1

## 2015-12-21 MED ORDER — BRIMONIDINE TARTRATE 0.15 % OP SOLN
1.0000 [drp] | Freq: Three times a day (TID) | OPHTHALMIC | Status: DC
  Administered 2015-12-21 – 2015-12-23 (×8): 1 [drp] via OPHTHALMIC
  Filled 2015-12-21: qty 5

## 2015-12-21 MED ORDER — PANTOPRAZOLE SODIUM 40 MG PO TBEC
40.0000 mg | DELAYED_RELEASE_TABLET | Freq: Every day | ORAL | Status: DC
  Administered 2015-12-21 – 2015-12-23 (×3): 40 mg via ORAL
  Filled 2015-12-21 (×3): qty 1

## 2015-12-21 MED ORDER — METOPROLOL TARTRATE 12.5 MG HALF TABLET
12.5000 mg | ORAL_TABLET | Freq: Two times a day (BID) | ORAL | Status: DC
  Administered 2015-12-21 – 2015-12-23 (×4): 12.5 mg via ORAL
  Filled 2015-12-21 (×4): qty 1

## 2015-12-21 MED ORDER — SODIUM CHLORIDE 0.9 % IV SOLN
INTRAVENOUS | Status: DC
  Administered 2015-12-21 – 2015-12-22 (×2): via INTRAVENOUS

## 2015-12-21 MED ORDER — DIPHENHYDRAMINE HCL 25 MG PO CAPS
25.0000 mg | ORAL_CAPSULE | Freq: Three times a day (TID) | ORAL | Status: DC | PRN
  Administered 2015-12-21 – 2015-12-22 (×2): 25 mg via ORAL
  Filled 2015-12-21 (×2): qty 1

## 2015-12-21 NOTE — Progress Notes (Signed)
Patient's ongoing chest pain was not improving with nitroglycerin titration. Stat EKG was performed and indicated sinus brady in high 40's. Notified the doctor and ordered IV morphine 2 mg for mid chest pain that radiates to right upper arm. IV morphine given twice for pain with tolerable pain relief. Will continue to monitor the patient and intervene as needed.

## 2015-12-21 NOTE — Care Management Obs Status (Signed)
[facility] NOTIFICATION   Patient Details  Name: CYLEE MONCE MRN: [id number] Date of Birth: [birth year]   Medicare Observation Status Notification Given:  Yes    Lacretia Leigh, RN 12/21/2015, 10:06 AM

## 2015-12-21 NOTE — Progress Notes (Signed)
ANTICOAGULATION CONSULT NOTE - Initial Consult  Pharmacy Consult for Heparin Indication: chest pain/ACS  Allergies  Allergen Reactions  . Aspirin Hives  . Blue Dyes (Parenteral) Hives  . Contrast Media [Iodinated Diagnostic Agents] Other (See Comments)    unknown  . Ibuprofen Hives  . Iohexol Other (See Comments)    unknown  . Shellfish-Derived Products Hives    Patient Measurements: Height: 5\' 6"  (167.6 cm) Weight: 210 lb (95.3 kg) IBW/kg (Calculated) : 59.3 Heparin Dosing Weight: 82 kg  Vital Signs: Temp: 98 F (36.7 C) (10/19 2304) Temp Source: Oral (10/19 2304) BP: 122/70 (10/19 2315) Pulse Rate: 65 (10/19 2315)  Labs:  Recent Labs  12/20/15 1815 12/20/15 2100  HGB 13.3  --   HCT 41.4  --   PLT 248  --   CREATININE  --  0.84  TROPONINI  --  <0.03    Estimated Creatinine Clearance: 60.1 mL/min (by C-G formula based on SCr of 0.84 mg/dL).   Medical History: Past Medical History:  Diagnosis Date  . Anemia    mild  . Bloody discharge from nipple   . Carotid artery stenosis   . Chronic kidney disease    renal insufficiency - DR Florene Glen yearly  . Diabetes mellitus without complication    type 2  . Full dentures   . History of hiatal hernia   . Hypertension   . Mobility impaired    uses walker for ambulation  . Stroke    right sided weakness,walker  . Wears glasses     Medications:  See home med rec  Assessment: 82 y.o. F presents with CP. To begin heparin for r/o ACS. CBC ok on admission. No AC PTA.   Goal of Therapy:  Heparin level 0.3-0.7 units/ml Monitor platelets by anticoagulation protocol: Yes   Plan:  Heparin IV bolus 4000 units Heparin gtt at 1000 units/hr Will f/u heparin level in 8 hours Daily heparin level and CBC  Sherlon Handing, PharmD, BCPS Clinical pharmacist, pager [phone] 12/21/2015,12:18 AM

## 2015-12-21 NOTE — Progress Notes (Signed)
ANTICOAGULATION CONSULT NOTE - Follow Up Consult  Pharmacy Consult for Heparin Indication: chest pain/ACS  Allergies  Allergen Reactions  . Aspirin Hives  . Blue Dyes (Parenteral) Hives  . Contrast Media [Iodinated Diagnostic Agents] Other (See Comments)    unknown  . Ibuprofen Hives  . Iohexol Other (See Comments)    unknown  . Shellfish-Derived Products Hives    Patient Measurements: Height: 5\' 6"  (167.6 cm) Weight: 196 lb 3.4 oz (89 kg) IBW/kg (Calculated) : 59.3 Heparin Dosing Weight: 82 kg  Vital Signs: Temp: 97.9 F (36.6 C) (10/20 0807) Temp Source: Oral (10/20 0807) BP: 129/97 (10/20 0807) Pulse Rate: 56 (10/20 0807)  Labs:  Recent Labs  12/20/15 1815 12/20/15 2100 12/21/15 0243 12/21/15 0856  HGB 13.3  --   --   --   HCT 41.4  --   --   --   PLT 248  --   --   --   HEPARINUNFRC  --   --   --  0.92*  CREATININE  --  0.84  --   --   TROPONINI  --  <0.03 <0.03 <0.03    Estimated Creatinine Clearance: 58 mL/min (by C-G formula based on SCr of 0.84 mg/dL).  Assessment:  82 yr old female with chest pain.  Troponins negative.  On Nitro drip.  Initial heparin level is supratherapeutic (0.92) on 1000 units/hr.  Goal of Therapy:  Heparin level 0.3-0.7 units/ml Monitor platelets by anticoagulation protocol: Yes   Plan:   Decrease heparin drip to 850 units/hr.  Heparin level ~6hrs after rate change.  Daily heparin level and CBC while on heparin.    Arty Baumgartner, [place] Pager: [phone] 12/21/2015,10:15 AM

## 2015-12-21 NOTE — H&P (Signed)
Katherine [name] is an 82 y.o. female.   Chief Complaint: recurrent chest pain [name] is a 89-year-old female with past medical history significant for coronary artery disease, hypertension, insulin-requiring diabetes mellitus, hyperlipidemia, history of CVA in the past, chronic venous insufficiency, degenerative joint disease, history of DVT in the past, status post IVC filter in the past, and came to ER by EMS complaining of retrosternal and left-sided chest pain radiating to the right arm, grade 6/10, associated with nausea and diaphoresis which woke her up, associated with dizziness and vertigo.  EKG done in the ED showed no acute ischemic changes.  Patient received  one sublingual nitroglycerin with relief of chest pain again had similar chest pain requiring sublingual nitroglycerin and was started on IV nitroglycerin and heparin with relief of chest pain. Patient denies relation of chest pain to food, breathing or movement. Past Medical History:  Diagnosis Date  . Anemia    mild  . Bloody discharge from nipple   . Carotid artery stenosis   . Chronic kidney disease    renal insufficiency - DR Katherine [name] yearly  . Diabetes mellitus without complication ([place])    type 2  . Full dentures   . History of hiatal hernia   . Hypertension   . Mobility impaired    uses walker for ambulation  . Stroke [facility])    right sided weakness,walker  . Wears glasses     Past Surgical History:  Procedure Laterality Date  . ABDOMINAL HYSTERECTOMY    . abdominal pain, RLQ    . angiodysplasia    . APPENDECTOMY    . BREAST DUCTAL SYSTEM EXCISION  03/02/2012   Procedure: EXCISION DUCTAL SYSTEM BREAST;  Surgeon: Adin Hector, MD;  Location: [place];  Service: General;  Laterality: Left;  . BREAST SURGERY     bx rt and lt   . CATARACT EXTRACTION     left  . CHOLECYSTECTOMY    . COLONOSCOPY N/A 10/15/2012   Procedure: COLONOSCOPY;  Surgeon: Beryle Beams, MD;  Location: [facility];   Service: Endoscopy;  Laterality: N/A;  . constipation    . DILATION AND CURETTAGE OF UTERUS    . DVT    . ENTEROSCOPY N/A 11/17/2014   Procedure: ENTEROSCOPY;  Surgeon: Carol Ada, MD;  Location: [facility];  Service: Endoscopy;  Laterality: N/A;  . ESOPHAGOGASTRODUODENOSCOPY N/A 09/10/2012   Procedure: ESOPHAGOGASTRODUODENOSCOPY (EGD);  Surgeon: Beryle Beams, MD;  Location: [facility];  Service: Endoscopy;  Laterality: N/A;  . EYE SURGERY     right-glaucoma  . HOT HEMOSTASIS N/A 09/10/2012   Procedure: HOT HEMOSTASIS (ARGON PLASMA COAGULATION/BICAP);  Surgeon: Beryle Beams, MD;  Location: [facility];  Service: Endoscopy;  Laterality: N/A;  . HOT HEMOSTASIS N/A 11/17/2014   Procedure: HOT HEMOSTASIS (ARGON PLASMA COAGULATION/BICAP);  Surgeon: Carol Ada, MD;  Location: [facility];  Service: Endoscopy;  Laterality: N/A;  . Nausea & Vomiting    . rectal bleeding    . TUBAL LIGATION      No family history on file. Social History:  reports that she quit smoking about 13 years ago. She has never used smokeless tobacco. She reports that she does not drink alcohol or use drugs.  Allergies:  Allergies  Allergen Reactions  . Aspirin Hives  . Blue Dyes (Parenteral) Hives  . Contrast Media [Iodinated Diagnostic Agents] Other (See Comments)    unknown  . Ibuprofen Hives  . Iohexol Other (See Comments)    unknown  .  Shellfish-Derived Products Hives    Medications Prior to Admission  Medication Sig Dispense Refill  . atorvastatin (LIPITOR) 40 MG tablet Take 40 mg by mouth daily.    . bimatoprost (LUMIGAN) 0.01 % SOLN Place 1 drop into both eyes at bedtime.    . brimonidine (ALPHAGAN P) 0.1 % SOLN Place 1 drop into both eyes every 8 (eight) hours.     . dorzolamide (TRUSOPT) 2 % ophthalmic solution Place 1 drop into both eyes 2 (two) times daily.    . Febuxostat (ULORIC) 80 MG TABS Take 80 mg by mouth daily.    . insulin lispro protamine-lispro (HUMALOG 75/25 MIX) (75-25) 100  UNIT/ML SUSP injection Inject 35-55 Units into the skin 2 (two) times daily. 55 units AM, 35 units PM    . omeprazole (PRILOSEC) 40 MG capsule Take 40 mg by mouth 2 (two) times daily.    . pilocarpine (PILOCAR) 4 % ophthalmic solution Place 1 drop into both eyes 2 (two) times daily.    . potassium chloride SA (K-DUR,KLOR-CON) 20 MEQ tablet Take 20 mEq by mouth daily.    Marland Kitchen torsemide (DEMADEX) 20 MG tablet Take 20 mg by mouth daily.    . valsartan (DIOVAN) 320 MG tablet Take 320 mg by mouth daily.    Marland Kitchen zolpidem (AMBIEN) 5 MG tablet Take 5 mg by mouth at bedtime as needed for sleep.       Results for orders placed or performed during the hospital encounter of 12/20/15 (from the past 48 hour(s))  CBC with Differential     Status: None   Collection Time: 12/20/15  6:15 PM  Result Value Ref Range   WBC 5.6 4.0 - 10.5 K/uL   RBC 4.85 3.87 - 5.11 MIL/uL   Hemoglobin 13.3 12.0 - 15.0 g/dL   HCT 41.4 36.0 - 46.0 %   MCV 85.4 78.0 - 100.0 fL   MCH 27.4 26.0 - 34.0 pg   MCHC 32.1 30.0 - 36.0 g/dL   RDW 14.4 11.5 - 15.5 %   Platelets 248 150 - 400 K/uL    Comment: REPEATED TO VERIFY PLATELET COUNT CONFIRMED BY SMEAR    Neutrophils Relative % 47 %   Neutro Abs 2.6 1.7 - 7.7 K/uL   Lymphocytes Relative 41 %   Lymphs Abs 2.3 0.7 - 4.0 K/uL   Monocytes Relative 8 %   Monocytes Absolute 0.4 0.1 - 1.0 K/uL   Eosinophils Relative 4 %   Eosinophils Absolute 0.2 0.0 - 0.7 K/uL   Basophils Relative 1 %   Basophils Absolute 0.0 0.0 - 0.1 K/uL  Basic metabolic panel     Status: Abnormal   Collection Time: 12/20/15  9:00 PM  Result Value Ref Range   Sodium 138 135 - 145 mmol/L   Potassium 4.5 3.5 - 5.1 mmol/L   Chloride 105 101 - 111 mmol/L   CO2 25 22 - 32 mmol/L   Glucose, Bld 227 (H) 65 - 99 mg/dL   BUN 12 6 - 20 mg/dL   Creatinine, Ser 0.84 0.44 - 1.00 mg/dL   Calcium 9.9 8.9 - 10.3 mg/dL   GFR calc non Af Amer >60 >60 mL/min   GFR calc Af Amer >60 >60 mL/min    Comment: (NOTE) The eGFR  has been calculated using the CKD EPI equation. This calculation has not been validated in all clinical situations. eGFR's persistently <60 mL/min signify possible Chronic Kidney Disease.    Anion gap 8 5 - 15  Troponin  I     Status: None   Collection Time: 12/20/15  9:00 PM  Result Value Ref Range   Troponin I <0.03 <0.03 ng/mL  Troponin I     Status: None   Collection Time: 12/21/15  2:43 AM  Result Value Ref Range   Troponin I <0.03 <0.03 ng/mL   No results found.  Review of Systems  Constitutional: Positive for diaphoresis and malaise/fatigue. Negative for chills and fever.  Eyes: Negative for double vision.  Respiratory: Negative for shortness of breath.   Cardiovascular: Positive for chest pain.  Gastrointestinal: Positive for nausea. Negative for abdominal pain and vomiting.  Genitourinary: Negative for dysuria.  Musculoskeletal: Negative for myalgias.  Neurological: Positive for dizziness. Negative for headaches.    Blood pressure (!) 151/75, pulse (!) 54, temperature 97.9 F (36.6 C), temperature source Oral, resp. rate 15, height '5\' 6"'  (1.676 m), weight 196 lb 3.4 oz (89 kg), SpO2 99 %. Physical Exam  Constitutional: She is oriented to person, place, and time.  HENT:  Head: Normocephalic and atraumatic.  Eyes: Conjunctivae are normal. Left eye exhibits no discharge. No scleral icterus.  Neck: Normal range of motion. Neck supple. No JVD present. No tracheal deviation present. No thyromegaly present.  Cardiovascular: Normal rate and regular rhythm.   Murmur (soft systolic murmurand S4 gallop noted) heard. Respiratory: Effort normal and breath sounds normal. No respiratory distress. She has no wheezes.  GI: Soft. Bowel sounds are normal. She exhibits no distension. There is no tenderness. There is no rebound.  Musculoskeletal: She exhibits no edema, tenderness or deformity.  Neurological: She is alert and oriented to person, place, and time.      Assessment/Plan Unstable angina, rule out MI. Coronary artery disease. Hypertension. Diabetes mellitus. Diabetic neuropathy. Hyperlipidemia. History of CVA. Degenerative joint disease. Chronic venous insufficiency. History of DVT and IVC filter in the past. GERD. Plan As per orders. Schedule for Lexiscan Myoview in a.m.   [name], MD 12/21/2015, 7:16 AM

## 2015-12-21 NOTE — Progress Notes (Signed)
[place] for Heparin Indication: chest pain/ACS  Allergies  Allergen Reactions  . Aspirin Hives  . Blue Dyes (Parenteral) Hives  . Contrast Media [Iodinated Diagnostic Agents] Other (See Comments)    unknown  . Ibuprofen Hives  . Iohexol Other (See Comments)    unknown  . Shellfish-Derived Products Hives    Patient Measurements: Height: 5\' 6"  (167.6 cm) Weight: 196 lb 3.4 oz (89 kg) IBW/kg (Calculated) : 59.3 Heparin Dosing Weight: 82 kg  Vital Signs: Temp: 98.1 F (36.7 C) (10/20 1200) Temp Source: Oral (10/20 1200) BP: 139/51 (10/20 1800) Pulse Rate: 54 (10/20 1800)  Labs:  Recent Labs  12/20/15 1815  12/20/15 2100 12/21/15 0243 12/21/15 0856 12/21/15 1504 12/21/15 1834  HGB 13.3  --   --   --   --   --   --   HCT 41.4  --   --   --   --   --   --   PLT 248  --   --   --   --   --   --   HEPARINUNFRC  --   --   --   --  0.92*  --  0.61  CREATININE  --   --  0.84  --   --   --   --   TROPONINI  --   < > <0.03 <0.03 <0.03 <0.03  --   < > = values in this interval not displayed.  Estimated Creatinine Clearance: 58 mL/min (by C-G formula based on SCr of 0.84 mg/dL).  Assessment:  82 yr old female with chest pain.  Troponins negative.  On Nitro drip. Confirmatory heparin level is WNL. Planning for stress test tomorrow.  Goal of Therapy:  Heparin level 0.3-0.7 units/ml Monitor platelets by anticoagulation protocol: Yes   Plan:  Continue heparin at 850 units/hr Daily HL, CBC Monitor s/sx bleeding       Hughes Better, PharmD, BCPS Clinical Pharmacist 12/21/2015 8:08 PM

## 2015-12-22 ENCOUNTER — Inpatient Hospital Stay (HOSPITAL_COMMUNITY): Payer: Medicare Other

## 2015-12-22 LAB — GLUCOSE, CAPILLARY
GLUCOSE-CAPILLARY: 188 mg/dL — AB (ref 65–99)
GLUCOSE-CAPILLARY: 269 mg/dL — AB (ref 65–99)
GLUCOSE-CAPILLARY: 192 mg/dL — AB (ref 65–99)
Glucose-Capillary: 203 mg/dL — ABNORMAL HIGH (ref 65–99)
Glucose-Capillary: 205 mg/dL — ABNORMAL HIGH (ref 65–99)

## 2015-12-22 LAB — BASIC METABOLIC PANEL
Anion gap: 7 (ref 5–15)
BUN: 16 mg/dL (ref 6–20)
CALCIUM: 9.1 mg/dL (ref 8.9–10.3)
CO2: 25 mmol/L (ref 22–32)
CREATININE: 0.95 mg/dL (ref 0.44–1.00)
Chloride: 105 mmol/L (ref 101–111)
GFR calc Af Amer: >60 mL/min (ref >60)
GFR, EST NON AFRICAN AMERICAN: 54 mL/min — AB (ref >60)
GLUCOSE: 182 mg/dL — AB (ref 65–99)
POTASSIUM: 4.5 mmol/L (ref 3.5–5.1)
SODIUM: 137 mmol/L (ref 135–145)

## 2015-12-22 LAB — LIPID PANEL
CHOL/HDL RATIO: 3.7 ratio
CHOLESTEROL: 154 mg/dL (ref 0–200)
HDL: 42 mg/dL (ref >40)
LDL Cholesterol: 97 mg/dL (ref 0–99)
Triglycerides: 73 mg/dL (ref <150)
VLDL: 15 mg/dL (ref 0–40)

## 2015-12-22 LAB — CBC
HCT: 38.9 % (ref 36.0–46.0)
Hemoglobin: 12.2 g/dL (ref 12.0–15.0)
MCH: 27.1 pg (ref 26.0–34.0)
MCHC: 31.4 g/dL (ref 30.0–36.0)
MCV: 86.3 fL (ref 78.0–100.0)
PLATELETS: 236 10*3/uL (ref 150–400)
RBC: 4.51 MIL/uL (ref 3.87–5.11)
RDW: 14.1 % (ref 11.5–15.5)
WBC: 7 10*3/uL (ref 4.0–10.5)

## 2015-12-22 LAB — HEPARIN LEVEL (UNFRACTIONATED): Heparin Unfractionated: 0.66 IU/mL (ref 0.30–0.70)

## 2015-12-22 MED ORDER — SODIUM CHLORIDE 0.9% FLUSH
3.0000 mL | Freq: Two times a day (BID) | INTRAVENOUS | Status: DC
Start: 2015-12-22 — End: 2015-12-23
  Administered 2015-12-22 – 2015-12-23 (×3): 3 mL via INTRAVENOUS

## 2015-12-22 MED ORDER — REGADENOSON 0.4 MG/5ML IV SOLN
0.4000 mg | Freq: Once | INTRAVENOUS | Status: AC
  Administered 2015-12-22: 0.4 mg via INTRAVENOUS
  Filled 2015-12-22: qty 5

## 2015-12-22 MED ORDER — TECHNETIUM TC 99M TETROFOSMIN IV KIT
30.0000 | PACK | Freq: Once | INTRAVENOUS | Status: AC | PRN
  Administered 2015-12-22: 30 via INTRAVENOUS

## 2015-12-22 MED ORDER — TECHNETIUM TC 99M TETROFOSMIN IV KIT
10.0000 | PACK | Freq: Once | INTRAVENOUS | Status: AC | PRN
  Administered 2015-12-22: 10 via INTRAVENOUS

## 2015-12-22 MED ORDER — SODIUM CHLORIDE 0.9% FLUSH: 3.0000 mL | INTRAVENOUS | Status: DC | PRN

## 2015-12-22 MED ORDER — REGADENOSON 0.4 MG/5ML IV SOLN
INTRAVENOUS | Status: AC
  Filled 2015-12-22: qty 5

## 2015-12-22 MED ORDER — HEPARIN SODIUM (PORCINE) 5000 UNIT/ML IJ SOLN
5000.0000 [IU] | Freq: Three times a day (TID) | INTRAMUSCULAR | Status: DC
  Administered 2015-12-23 (×2): 5000 [IU] via SUBCUTANEOUS
  Filled 2015-12-22 (×2): qty 1

## 2015-12-22 NOTE — Progress Notes (Signed)
ANTICOAGULATION CONSULT NOTE - Follow Up Consult  Pharmacy Consult for Heparin Indication: chest pain/ACS  Allergies  Allergen Reactions  . Aspirin Hives  . Blue Dyes (Parenteral) Hives  . Contrast Media [Iodinated Diagnostic Agents] Other (See Comments)    unknown  . Ibuprofen Hives  . Iohexol Other (See Comments)    unknown  . Shellfish-Derived Products Hives    Patient Measurements: Height: 5\' 6"  (167.6 cm) Weight: 196 lb 3.4 oz (89 kg) IBW/kg (Calculated) : 59.3 Heparin Dosing Weight: 82 kg  Vital Signs: Temp: 98.1 F (36.7 C) (10/21 1100) Temp Source: Oral (10/21 1100) BP: 136/65 (10/21 1100) Pulse Rate: 56 (10/21 0746)  Labs:  Recent Labs  12/20/15 1815 12/20/15 2100  12/21/15 0856 12/21/15 1504 12/21/15 1834 12/21/15 2050 12/22/15 0248  HGB 13.3  --   --   --   --   --   --  12.2  HCT 41.4  --   --   --   --   --   --  38.9  PLT 248  --   --   --   --   --   --  236  HEPARINUNFRC  --   --   --  0.92*  --  0.61  --  0.66  CREATININE  --  0.84  --   --   --   --   --  0.95  TROPONINI  --  <0.03  < > <0.03 <0.03  --  <0.03  --   < > = values in this interval not displayed.  Estimated Creatinine Clearance: 51.3 mL/min (by C-G formula based on SCr of 0.95 mg/dL).  Assessment:  82 yr old female with chest pain.  Troponins negative. Stress test today.  Heparin level remains therapeutic (0.66) on 850 units/hr.  Goal of Therapy:  Heparin level 0.3-0.7 units/ml Monitor platelets by anticoagulation protocol: Yes   Plan:   Continue heparin drip at 850 units/hr.  Daily heparin level and CBC while on heparin.    Arty Baumgartner, [place] Pager: [phone] 12/22/2015,12:25 PM

## 2015-12-22 NOTE — Progress Notes (Signed)
Subjective:  Continues to be retrosternal and right-sided chest pain radiating to right arm cardiac enzymes have been negative no new EKG changes noted patient seen in nuclear medicine department tolerated the stress portion of Lexiscan Myoview.  Objective:  Vital Signs in the last 24 hours: Temp:  [97.7 F (36.5 C)-98.6 F (37 C)] 98.3 F (36.8 C) (10/21 0746) Pulse Rate:  [46-70] 56 (10/21 0746) Resp:  [10-24] 15 (10/21 0746) BP: (75-157)/(39-79) 121/59 (10/21 1022) SpO2:  [99 %-100 %] 99 % (10/21 0746)  Intake/Output from previous day: 10/20 0701 - 10/21 0700 In: [id number] [P.O.:120; I.V.:525] Out: 875 [Urine:875] Intake/Output from this shift: Total I/O In: -  Out: 225 [Urine:225]  Physical Exam: Exam unchanged  Lab Results:  Recent Labs  12/20/15 1815 12/22/15 0248  WBC 5.6 7.0  HGB 13.3 12.2  PLT 248 236    Recent Labs  12/20/15 2100 12/22/15 0248  NA 138 137  K 4.5 4.5  CL 105 105  CO2 25 25  GLUCOSE 227* 182*  BUN 12 16  CREATININE 0.84 0.95    Recent Labs  12/21/15 1504 12/21/15 2050  TROPONINI <0.03 <0.03   Hepatic Function Panel No results for input(s): PROT, ALBUMIN, AST, ALT, ALKPHOS, BILITOT, BILIDIR, IBILI in the last 72 hours.  Recent Labs  12/22/15 0248  CHOL 154   No results for input(s): PROTIME in the last 72 hours.  Imaging: Imaging results have been reviewed and No results found.  Cardiac Studies:  Assessment/Plan:  Recurrent chest pain MI ruled out Coronary artery disease. Hypertension. Diabetes mellitus. Diabetic neuropathy. Hyperlipidemia. History of CVA. Degenerative joint disease. Chronic venous insufficiency. History of DVT and IVC filter in the past. GERD. Plan Continue present management Check nuclear stress test result   LOS: 1 day    [name] 12/22/2015, 10:27 AM

## 2015-12-23 LAB — CBC
HCT: 36.4 % (ref 36.0–46.0)
HEMOGLOBIN: 12.2 g/dL (ref 12.0–15.0)
MCH: 28.5 pg (ref 26.0–34.0)
MCHC: 33.5 g/dL (ref 30.0–36.0)
MCV: 85 fL (ref 78.0–100.0)
PLATELETS: 264 10*3/uL (ref 150–400)
RBC: 4.28 MIL/uL (ref 3.87–5.11)
RDW: 13.7 % (ref 11.5–15.5)
WBC: 7 10*3/uL (ref 4.0–10.5)

## 2015-12-23 LAB — GLUCOSE, CAPILLARY
GLUCOSE-CAPILLARY: 214 mg/dL — AB (ref 65–99)
Glucose-Capillary: 176 mg/dL — ABNORMAL HIGH (ref 65–99)

## 2015-12-23 MED ORDER — METOPROLOL TARTRATE 25 MG PO TABS: 12.5000 mg | ORAL_TABLET | Freq: Two times a day (BID) | ORAL | 3 refills | Status: DC

## 2015-12-23 MED ORDER — NITROGLYCERIN 0.4 MG SL SUBL: 0.4000 mg | SUBLINGUAL_TABLET | SUBLINGUAL | 12 refills | Status: DC | PRN

## 2015-12-23 MED ORDER — CLOPIDOGREL BISULFATE 75 MG PO TABS: 75.0000 mg | ORAL_TABLET | Freq: Every day | ORAL | 3 refills | Status: DC

## 2015-12-23 NOTE — Progress Notes (Signed)
IV's removed with no complications. Discharge instructions reviewed,  No questions or concerns at this time. RN accompanied patient to car, sister is providing her transportation home. All personal belongings are accounted for.

## 2015-12-23 NOTE — Discharge Summary (Signed)
NAMEKASON, CARN                 ACCOUNT NO.:  [id number]  MEDICAL RECORD NO.:  [id number]  LOCATION:  2H16C                        FACILITY:  [facility]  PHYSICIAN:  Loreen Bankson N. Terrence Dupont, M.D. DATE OF BIRTH:  [birth year]  DATE OF ADMISSION:  12/21/2015 DATE OF DISCHARGE:  12/23/2015                              DISCHARGE SUMMARY   ADMITTING DIAGNOSES: 1. Unstable angina, rule out myocardial infarction, coronary artery     disease. 2. Hypertension. 3. Diabetes mellitus. 4. Diabetic neuropathy. 5. Hyperlipidemia. 6. History of cerebrovascular accident. 7. Degenerative joint disease. 8. Chronic venous insufficiency. 9. History of deep venous thrombosis and IVC filter in the past. 10.Gastroesophageal reflux disease.  DISCHARGE DIAGNOSES: 1. Stable angina, myocardial infarction ruled out. 2. Negative nuclear stress test. 3. Coronary artery disease. 4. Hypertension. 5. Diabetes mellitus. 6. Diabetic neuropathy. 7. Hyperlipidemia. 8. History of cerebrovascular accident. 9. Degenerative joint disease. 10.Chronic venous insufficiency. 11.History of deep vein thrombosis and IVC filter in the past. 12.Gastroesophageal reflux disease.  DISCHARGE MEDICATIONS: 1. Clopidogrel 75 mg one tablet daily. 2. Metoprolol tartrate 25 mg half tablet twice daily. 3. Nitrostat 0.4 mg sublingual use as directed. 4. Atorvastatin 40 mg daily. 5. Humalog 75/25 mix 35 to 55 units twice daily as before. 6. Omeprazole 40 mg twice daily as before. 7. Potassium chloride 20 mEq one tablet daily. 8. Furosemide 20 mg one tablet daily. 9. Uloric 80 mg daily. 10.Valsartan 320 mg daily. 11.The patient is on multiple eye drops.  Advised to continue the     same.  DIET:  Low-salt, low-cholesterol, 1800 calories ADA diet.  FOLLOWUP:  Follow up with me in 1 week.  CONDITION AT DISCHARGE:  Stable.  BRIEF HISTORY AND HOSPITAL COURSE:  Ms. Sehnert is an 82 year old female with past medical history significant for  multiple medical problems, i.e., coronary artery disease, hypertension, insulin-requiring diabetes mellitus, hyperlipidemia, history of CVA, chronic venous insufficiency, degenerative joint disease, history of deep vein thrombosis in the past, status post IVC filter.  She came to the ER by EMS complaining of retrosternal and left-sided chest pain radiating to the right arm, grade 6 or 10, associated with nausea and diaphoresis, which woke her up, associated with dizziness and vertigo.  EKG done in the ED showed no acute ischemic changes.  The patient received sublingual nitro with relief of chest pain.  Again, had similar chest pain, requiring sublingual nitro and was started on IV nitro and heparin with relief of chest pain.  The patient presently denies any chest pain.  Denies any relation of chest pain to food, breathing, or movement.  PHYSICAL EXAMINATION:  GENERAL:  She was alert, awake, and oriented x3. VITAL SIGNS:  Blood pressure was 151/75, pulse is 54.  She was afebrile. HEENT:  Conjunctivae were pink. NECK:  Supple.  No JVD.  No bruit. LUNGS:  Clear to auscultation without rhonchi or rales. CARDIOVASCULAR:  S1, S2 was normal.  There were soft systolic murmur and S4 gallop. ABDOMEN:  Soft.  Bowel sounds present.  Nontender. EXTREMITIES:  There is no clubbing, cyanosis, or edema. NEURO:  She was alert, awake, and oriented x3.  LABORATORY DATA:  Sodium was 138, potassium 4.5, BUN  12, creatinine 0.84, glucose was 227.  Three sets of troponin-I were negative. Hemoglobin was 13.3, hematocrit 41.4, white count of 5.6.  Her hemoglobin A1c was 10.2.  BRIEF HOSPITAL COURSE:  The patient was admitted to step-down unit. Ruled out for MI by serial enzymes and EKG.  The patient subsequently underwent nuclear stress test, which showed no reversible ischemia or infarction.  Normal LV systolic function and wall motion with EF of 74%. The patient did not have further episodes of anginal  chest pain during the hospital stay.  A nuclear stress test has been negative.  The patient will be discharged home on above medications and will be followed up in my office in 1 week.     Allegra Lai. Terrence Dupont, M.D.     MNH/MEDQ  D:  12/23/2015  T:  12/23/2015  Job:  [id number]

## 2015-12-23 NOTE — Progress Notes (Signed)
Per MD patient to be discharged to day. Contacted family and sister will be able to transport patient after church today.

## 2015-12-23 NOTE — Discharge Summary (Signed)
Discharge summary dictated on 12/23/2015 dictation number is [phone]

## 2015-12-23 NOTE — Discharge Instructions (Signed)
K Angina Pectoris Angina pectoris, often called angina, is extreme discomfort in the chest, neck, or arm. This is caused by a lack of blood in the middle and thickest layer of the heart wall (myocardium). There are four types of angina:  Stable angina. Stable angina usually occurs in episodes of predictable frequency and duration. It is usually brought on by physical activity, stress, or excitement. Stable angina usually lasts a few minutes and can often be relieved by a medicine that you place under your tongue. This medicine is called sublingual nitroglycerin.  Unstable angina. Unstable angina can occur even when you are doing little or no physical activity. It can even occur while you are sleeping or when you are at rest. It can suddenly increase in severity or frequency. It may not be relieved by sublingual nitroglycerin, and it can last up to 30 minutes.  Microvascular angina. This type of angina is caused by a disorder of tiny blood vessels called arterioles. Microvascular angina is more common in women. The pain may be more severe and last longer than other types of angina pectoris.  Prinzmetal or variant angina. This type of angina pectoris is rare and usually occurs when you are doing little or no physical activity. It especially occurs in the early morning hours. CAUSES Atherosclerosis is the cause of angina. This is the buildup of fat and cholesterol (plaque) on the inside of the arteries. Over time, the plaque may narrow or block the artery, and this will lessen blood flow to the heart. Plaque can also become weak and break off within a coronary artery to form a clot and cause a sudden blockage. RISK FACTORS Risk factors common to both men and women include:  High cholesterol levels.  High blood pressure (hypertension).  Tobacco use.  Diabetes.  Family history of angina.  Obesity.  Lack of exercise.  A diet high in saturated fats. Women are at greater risk for angina if they  are:  Over age 31.  Postmenopausal. SYMPTOMS Many people do not experience any symptoms during the early stages of angina. As the condition progresses, symptoms common to both men and women may include:  Chest pain.  The pain can be described as a crushing or squeezing in the chest, or a tightness, pressure, fullness, or heaviness in the chest.  The pain can last more than a few minutes, or it can stop and recur.  Pain in the arms, neck, jaw, or back.  Unexplained heartburn or indigestion.  Shortness of breath.  Nausea.  Sudden cold sweats.  Sudden light-headedness. Many women have chest discomfort and some of the other symptoms. However, women often have different (atypical) symptoms, such as:   Fatigue.  Unexplained feelings of nervousness or anxiety.  Unexplained weakness.  Dizziness or fainting. Sometimes, women may have angina without any symptoms. DIAGNOSIS  Tests to diagnose angina may include:  ECG (electrocardiogram).  Exercise stress test. This looks for signs of blockage when the heart is being exercised.  Pharmacologic stress test. This test looks for signs of blockage when the heart is being stressed with a medicine.  Blood tests.  Coronary angiogram. This is a procedure to look at the coronary arteries to see if there is any blockage. TREATMENT  The treatment of angina may include the following:  Healthy behavioral changes to reduce or control risk factors.  Medicine.  Coronary stenting.A stent helps to keep an artery open.  Coronary angioplasty. This procedure widens a narrowed or blocked artery.  Coronary  arterybypass surgery. This will allow your blood to pass the blockage (bypass) to reach your heart. HOME CARE INSTRUCTIONS   Take medicines only as directed by your health care provider.  Do not take the following medicines unless your health care provider approves:  Nonsteroidal anti-inflammatory drugs (NSAIDs), such as ibuprofen,  naproxen, or celecoxib.  Vitamin supplements that contain vitamin A, vitamin E, or both.  Hormone replacement therapy that contains estrogen with or without progestin.  Manage other health conditions such as hypertension and diabetes as directed by your health care provider.  Follow a heart-healthy diet. A dietitian can help to educate you about healthy food options and changes.  Use healthy cooking methods such as roasting, grilling, broiling, baking, poaching, steaming, or stir-frying. Talk to a dietitian to learn more about healthy cooking methods.  Follow an exercise program approved by your health care provider.  Maintain a healthy weight. Lose weight as approved by your health care provider.  Plan rest periods when fatigued.  Learn to manage stress.  Do not use any tobacco products, including cigarettes, chewing tobacco, or electronic cigarettes. If you need help quitting, ask your health care provider.  If you drink alcohol, and your health care provider approves, limit your alcohol intake to no more than 1 drink per day. One drink equals 12 ounces of beer, 5 ounces of wine, or 1 ounces of hard liquor.  Stop illegal drug use.  Keep all follow-up visits as directed by your health care provider. This is important. SEEK IMMEDIATE MEDICAL CARE IF:   You have pain in your chest, neck, arm, jaw, stomach, or back that lasts more than a few minutes, is recurring, or is unrelieved by taking sublingualnitroglycerin.  You have profuse sweating without cause.  You have unexplained:  Heartburn or indigestion.  Shortness of breath or difficulty breathing.  Nausea or vomiting.  Fatigue.  Feelings of nervousness or anxiety.  Weakness.  Diarrhea.  You have sudden light-headedness or dizziness.  You faint. These symptoms may represent a serious problem that is an emergency. Do not wait to see if the symptoms will go away. Get medical help right away. Call your local  emergency services (911 in the [place] Do not drive yourself to the hospital.   This information is not intended to replace advice given to you by your health care provider. Make sure you discuss any questions you have with your health care provider.   Document Released: 02/17/2005 Document Revised: 03/10/2014 Document Reviewed: 06/21/2013 Elsevier Interactive Patient Education [employer].

## 2016-01-15 ENCOUNTER — Encounter: Payer: Medicare Other | Admitting: Vascular Surgery

## 2016-01-16 ENCOUNTER — Encounter: Payer: Self-pay | Admitting: Vascular Surgery

## 2016-01-29 ENCOUNTER — Ambulatory Visit (INDEPENDENT_AMBULATORY_CARE_PROVIDER_SITE_OTHER): Payer: Medicare Other | Admitting: Vascular Surgery

## 2016-01-29 ENCOUNTER — Encounter: Payer: Self-pay | Admitting: Vascular Surgery

## 2016-01-29 VITALS — BP 152/82 | HR 72 | Temp 98.2°F | Resp 20 | Ht 66.0 in | Wt 202.0 lb

## 2016-01-29 DIAGNOSIS — I6521 Occlusion and stenosis of right carotid artery: Secondary | ICD-10-CM | POA: Diagnosis not present

## 2016-01-29 DIAGNOSIS — I6529 Occlusion and stenosis of unspecified carotid artery: Secondary | ICD-10-CM | POA: Diagnosis not present

## 2016-01-29 NOTE — Progress Notes (Signed)
[facility] [place]  Patient name: Katherine [name] MRN: [id number] DOB: [birth year] Sex: female  REASON FOR CONSULT: Discuss ultrasound finding of moderate to severe right internal carotid artery stenosis  HPI: Katherine [name] is a 82 y.o. female, who is well known to me from prior left carotid endarterectomy in 2003. She had a preoperative neurologic event and had good return of neurologic function. She has had no focal neurologic deficits since this time. She did have some dizziness and recent hospitalization for exacerbation of her congestive heart failure. He specifically denies any episodes of amaurosis fugax, transient ischemic attack or stroke. She does walk with a walker due to balance problems.  Past Medical History:  Diagnosis Date  . Anemia    mild  . Atrial fibrillation ([place])   . Bloody discharge from nipple   . CAD (coronary artery disease)   . Carotid artery stenosis   . CHF (congestive heart failure) ([place])   . Chronic kidney disease    renal insufficiency - DR Florene Glen yearly  . Diabetes mellitus without complication ([place])    type 2  . Full dentures   . History of hiatal hernia   . Hypertension   . Mobility impaired    uses walker for ambulation  . Stroke [facility])    right sided weakness,walker  . Wears glasses     Family History  Problem Relation Age of Onset  . Heart disease Sister   . Heart disease Brother     SOCIAL HISTORY: Social History   Social History  . Marital status: Widowed    Spouse name: N/A  . Number of children: N/A  . Years of education: N/A   Occupational History  . Not on file.   Social History Main Topics  . Smoking status: Former Smoker    Quit date: 02/25/2002  . Smokeless tobacco: Never Used  . Alcohol use No  . Drug use: No  . Sexual activity: Not Currently   Other Topics Concern  . Not on file   Social History Narrative  . No narrative on file    Allergies    Allergen Reactions  . Aspirin Hives  . Blue Dyes (Parenteral) Hives  . Contrast Media [Iodinated Diagnostic Agents] Other (See Comments)    unknown  . Ibuprofen Hives  . Iohexol Other (See Comments)    unknown  . Shellfish-Derived Products Hives    Current Outpatient Prescriptions  Medication Sig Dispense Refill  . amLODipine-valsartan (EXFORGE) 5-320 MG tablet     . bimatoprost (LUMIGAN) 0.01 % SOLN Place 1 drop into both eyes at bedtime.    . brimonidine (ALPHAGAN P) 0.1 % SOLN Place 1 drop into both eyes every 8 (eight) hours.     . clopidogrel (PLAVIX) 75 MG tablet Take 1 tablet (75 mg total) by mouth daily. 30 tablet 3  . dorzolamide (TRUSOPT) 2 % ophthalmic solution Place 1 drop into both eyes 2 (two) times daily.    . Febuxostat (ULORIC) 80 MG TABS Take 80 mg by mouth daily.    . fentaNYL (DURAGESIC - DOSED MCG/HR) 12 MCG/HR     . metoprolol tartrate (LOPRESSOR) 25 MG tablet Take 0.5 tablets (12.5 mg total) by mouth 2 (two) times daily. 30 tablet 3  . nitroGLYCERIN (NITRODUR - DOSED IN MG/24 HR) 0.4 mg/hr patch     . omeprazole (PRILOSEC) 40 MG capsule Take 40 mg by mouth 2 (two) times daily.    . potassium chloride  SA (K-DUR,KLOR-CON) 20 MEQ tablet Take 20 mEq by mouth daily.    Marland Kitchen atorvastatin (LIPITOR) 40 MG tablet Take 40 mg by mouth daily.    . insulin lispro protamine-lispro (HUMALOG 75/25 MIX) (75-25) 100 UNIT/ML SUSP injection Inject 35-55 Units into the skin 2 (two) times daily. 55 units AM, 35 units PM    . nitroGLYCERIN (NITROSTAT) 0.4 MG SL tablet Place 1 tablet (0.4 mg total) under the tongue every 5 (five) minutes as needed for chest pain (CP or SOB). (Patient not taking: Reported on 01/29/2016) 25 tablet 12  . torsemide (DEMADEX) 20 MG tablet Take 20 mg by mouth daily.    . valsartan (DIOVAN) 320 MG tablet Take 320 mg by mouth daily.     No current facility-administered medications for this visit.     REVIEW OF SYSTEMS:  [X]  denotes positive finding, [ ]   denotes negative finding Cardiac  Comments:  Chest pain or chest pressure: x   Shortness of breath upon exertion: x   Short of breath when lying flat:    Irregular heart rhythm: x       Vascular    Pain in calf, thigh, or hip brought on by ambulation: x hip  Pain in feet at night that wakes you up from your sleep:  x   Blood clot in your veins:    Leg swelling:  x       Pulmonary    Oxygen at home:    Productive cough:     Wheezing:         Neurologic    Sudden weakness in arms or legs:  x   Sudden numbness in arms or legs:  x   Sudden onset of difficulty speaking or slurred speech: x   Temporary loss of vision in one eye:     Problems with dizziness:  x       Gastrointestinal    Blood in stool:  x   Vomited blood:         Genitourinary    Burning when urinating:  x   Blood in urine:        Psychiatric    Major depression:         Hematologic    Bleeding problems:    Problems with blood clotting too easily:        Skin    Rashes or ulcers:        Constitutional    Fever or chills:      PHYSICAL EXAM: Vitals:   01/29/16 1120 01/29/16 1121  BP: (!) 149/86 (!) 152/82  Pulse: 72   Resp: 20   Temp: 98.2 F (36.8 C)   TempSrc: Oral   SpO2: 100%   Weight: 202 lb (91.6 kg)   Height: 5\' 6"  (1.676 m)     GENERAL: The patient is a well-nourished female, in no acute distress. The vital signs are documented above. CARDIOVASCULAR: 2+ radial and 2+ dorsalis pedis pulses bilaterally Left carotid incision well-healed and no carotid bruits bilaterally PULMONARY: There is good air exchange  ABDOMEN: Soft and non-tender  MUSCULOSKELETAL: There are no major deformities or cyanosis. NEUROLOGIC: No focal weakness or paresthesias are detected. SKIN: There are no ulcers or rashes noted. PSYCHIATRIC: The patient has a normal affect.  DATA:  Carotid duplex from 11/19/2015 at [facility] hospital was reviewed. This reveals no evidence of recurrent stenosis in her left carotid  endarterectomy site. She has moderate 50-60% right carotid stenosis  MEDICAL ISSUES: Long  discussion with the patient. Explained that she does not have any indication for treatment. She's had a very nice durable result from her endarterectomy in her left carotid for symptomatic disease in 2003. She does have moderate asymptomatic carotid stenosis on the right. I have recommended yearly carotid duplex to rule out any progression. I explained the symptoms of carotid disease with her and she knows to report immediately to the emergency room should this occur. We will see her in one eye one year with repeat carotid duplex and follow up with our nurse practitioner   Rosetta Posner, MD [facility] Tel [phone] Pager [phone]

## 2016-01-29 NOTE — Addendum Note (Signed)
Addended by: Lianne Cure A on: 01/29/2016 01:34 PM   Modules accepted: Orders

## 2016-04-10 ENCOUNTER — Other Ambulatory Visit (HOSPITAL_COMMUNITY): Payer: Self-pay | Admitting: *Deleted

## 2016-10-24 ENCOUNTER — Other Ambulatory Visit: Payer: Self-pay | Admitting: Cardiology

## 2016-10-24 DIAGNOSIS — R079 Chest pain, unspecified: Secondary | ICD-10-CM

## 2016-10-31 ENCOUNTER — Ambulatory Visit (HOSPITAL_COMMUNITY)
Admission: RE | Admit: 2016-10-31 | Discharge: 2016-10-31 | Disposition: A | Discharge status: Home / Self Care | Payer: Medicare Other | Source: Ambulatory Visit | Attending: Cardiology | Admitting: Cardiology

## 2016-10-31 DIAGNOSIS — R079 Chest pain, unspecified: Secondary | ICD-10-CM | POA: Insufficient documentation

## 2016-10-31 MED ORDER — REGADENOSON 0.4 MG/5ML IV SOLN
INTRAVENOUS | Status: AC
  Administered 2016-10-31: 0.4 mg via INTRAVENOUS
  Filled 2016-10-31: qty 5

## 2016-10-31 MED ORDER — REGADENOSON 0.4 MG/5ML IV SOLN
INTRAVENOUS | Status: AC
  Filled 2016-10-31: qty 5

## 2016-10-31 MED ORDER — TECHNETIUM TC 99M TETROFOSMIN IV KIT
30.0000 | PACK | Freq: Once | INTRAVENOUS | Status: AC | PRN
  Administered 2016-10-31: 30 via INTRAVENOUS

## 2016-10-31 MED ORDER — REGADENOSON 0.4 MG/5ML IV SOLN
0.4000 mg | Freq: Once | INTRAVENOUS | Status: AC
  Administered 2016-10-31: 0.4 mg via INTRAVENOUS

## 2016-10-31 MED ORDER — TECHNETIUM TC 99M TETROFOSMIN IV KIT
10.0000 | PACK | Freq: Once | INTRAVENOUS | Status: AC | PRN
  Administered 2016-10-31: 10 via INTRAVENOUS

## 2016-10-31 NOTE — Progress Notes (Signed)
7 mins, pt reports "heaviness" has passed. Denies CP/SOB. Dr. Terrence Dupont at bedside. Testing ended.

## 2016-10-31 NOTE — Progress Notes (Signed)
5 mins in, pt continues to endorse "heaviness all over" but states its getting better, Dr. Terrence Dupont at bedside. Pt denies CP/SOB

## 2016-10-31 NOTE — Progress Notes (Signed)
3 mins, Dr. Terrence Dupont at bedside. Pt reports some heavy feeling al over, denied SOB

## 2016-11-27 ENCOUNTER — Emergency Department (HOSPITAL_COMMUNITY)
Admission: EM | Admit: 2016-11-27 | Discharge: 2016-11-27 | Disposition: A | Discharge status: Home / Self Care | Payer: Medicare Other | Attending: Emergency Medicine | Admitting: Emergency Medicine

## 2016-11-27 ENCOUNTER — Encounter (HOSPITAL_COMMUNITY): Payer: Self-pay | Admitting: Emergency Medicine

## 2016-11-27 ENCOUNTER — Emergency Department (HOSPITAL_COMMUNITY): Payer: Medicare Other

## 2016-11-27 DIAGNOSIS — R112 Nausea with vomiting, unspecified: Secondary | ICD-10-CM | POA: Diagnosis not present

## 2016-11-27 DIAGNOSIS — I130 Hypertensive heart and chronic kidney disease with heart failure and stage 1 through stage 4 chronic kidney disease, or unspecified chronic kidney disease: Secondary | ICD-10-CM | POA: Insufficient documentation

## 2016-11-27 DIAGNOSIS — R1084 Generalized abdominal pain: Secondary | ICD-10-CM | POA: Insufficient documentation

## 2016-11-27 DIAGNOSIS — I2510 Atherosclerotic heart disease of native coronary artery without angina pectoris: Secondary | ICD-10-CM | POA: Insufficient documentation

## 2016-11-27 DIAGNOSIS — E119 Type 2 diabetes mellitus without complications: Secondary | ICD-10-CM | POA: Diagnosis not present

## 2016-11-27 DIAGNOSIS — D649 Anemia, unspecified: Secondary | ICD-10-CM | POA: Diagnosis not present

## 2016-11-27 DIAGNOSIS — I4891 Unspecified atrial fibrillation: Secondary | ICD-10-CM | POA: Diagnosis not present

## 2016-11-27 DIAGNOSIS — Z794 Long term (current) use of insulin: Secondary | ICD-10-CM | POA: Diagnosis not present

## 2016-11-27 DIAGNOSIS — Z87891 Personal history of nicotine dependence: Secondary | ICD-10-CM | POA: Insufficient documentation

## 2016-11-27 DIAGNOSIS — Z79899 Other long term (current) drug therapy: Secondary | ICD-10-CM | POA: Insufficient documentation

## 2016-11-27 DIAGNOSIS — N189 Chronic kidney disease, unspecified: Secondary | ICD-10-CM | POA: Diagnosis not present

## 2016-11-27 DIAGNOSIS — I509 Heart failure, unspecified: Secondary | ICD-10-CM | POA: Insufficient documentation

## 2016-11-27 HISTORY — DX: Unspecified intestinal obstruction, unspecified as to partial versus complete obstruction: K56.609

## 2016-11-27 LAB — CBC
HEMATOCRIT: 42.1 % (ref 36.0–46.0)
Hemoglobin: 13.6 g/dL (ref 12.0–15.0)
MCH: 28 pg (ref 26.0–34.0)
MCHC: 32.3 g/dL (ref 30.0–36.0)
MCV: 86.6 fL (ref 78.0–100.0)
Platelets: 257 10*3/uL (ref 150–400)
RBC: 4.86 MIL/uL (ref 3.87–5.11)
RDW: 14.1 % (ref 11.5–15.5)
WBC: 8 10*3/uL (ref 4.0–10.5)

## 2016-11-27 LAB — URINALYSIS, ROUTINE W REFLEX MICROSCOPIC
BILIRUBIN URINE: NEGATIVE
Glucose, UA: NEGATIVE mg/dL
HGB URINE DIPSTICK: NEGATIVE
Ketones, ur: NEGATIVE mg/dL
Nitrite: NEGATIVE
PROTEIN: 100 mg/dL — AB
Specific Gravity, Urine: 1.017 (ref 1.005–1.030)
pH: 5 (ref 5.0–8.0)

## 2016-11-27 LAB — COMPREHENSIVE METABOLIC PANEL
ALT: 16 U/L (ref 14–54)
AST: 22 U/L (ref 15–41)
Albumin: 4.2 g/dL (ref 3.5–5.0)
Alkaline Phosphatase: 72 U/L (ref 38–126)
Anion gap: 9 (ref 5–15)
BUN: 15 mg/dL (ref 6–20)
CHLORIDE: 108 mmol/L (ref 101–111)
CO2: 23 mmol/L (ref 22–32)
Calcium: 9.9 mg/dL (ref 8.9–10.3)
Creatinine, Ser: 1 mg/dL (ref 0.44–1.00)
GFR calc Af Amer: 59 mL/min — ABNORMAL LOW (ref >60)
GFR, EST NON AFRICAN AMERICAN: 51 mL/min — AB (ref >60)
Glucose, Bld: 148 mg/dL — ABNORMAL HIGH (ref 65–99)
POTASSIUM: 4.2 mmol/L (ref 3.5–5.1)
SODIUM: 140 mmol/L (ref 135–145)
Total Bilirubin: 0.4 mg/dL (ref 0.3–1.2)
Total Protein: 8.2 g/dL — ABNORMAL HIGH (ref 6.5–8.1)

## 2016-11-27 LAB — POC OCCULT BLOOD, ED: FECAL OCCULT BLD: NEGATIVE

## 2016-11-27 LAB — LIPASE, BLOOD: LIPASE: 35 U/L (ref 11–51)

## 2016-11-27 MED ORDER — ACETAMINOPHEN 325 MG PO TABS
650.0000 mg | ORAL_TABLET | Freq: Once | ORAL | Status: DC
  Filled 2016-11-27: qty 2

## 2016-11-27 MED ORDER — FENTANYL CITRATE (PF) 100 MCG/2ML IJ SOLN
50.0000 ug | INTRAMUSCULAR | Status: DC | PRN
  Administered 2016-11-27 (×2): 50 ug via INTRAVENOUS
  Filled 2016-11-27 (×2): qty 2

## 2016-11-27 MED ORDER — ONDANSETRON HCL 4 MG/2ML IJ SOLN
4.0000 mg | Freq: Once | INTRAMUSCULAR | Status: AC
  Administered 2016-11-27: 4 mg via INTRAVENOUS
  Filled 2016-11-27: qty 2

## 2016-11-27 NOTE — ED Notes (Signed)
Pt verbalized understanding of d.c, no further questions.

## 2016-11-27 NOTE — ED Provider Notes (Signed)
[facility] DEPT Provider Note   CSN: [id number] Arrival date & time: 11/27/16  0736     History   Chief Complaint Chief Complaint  Patient presents with  . Abdominal Pain    HPI Katherine [name] is a 83 y.o. female.  She presents for evaluation of abdominal pain associated with nausea and vomiting, for 2 days.  Initially she told nursing she was constipated but she reports to me that she is not constipated and has had regular bowel daily.  She admits to decreased oral intake, because of nausea and vomiting.  She complains of crampy abdominal pain.  She is worried that she has a bowel obstruction.  She denies fever, chills, cough or chest pain.  There are no other known modifying factors.  HPI  Past Medical History:  Diagnosis Date  . Anemia    mild  . Atrial fibrillation ([place])   . Bloody discharge from nipple   . Bowel obstruction ([place])   . CAD (coronary artery disease)   . Carotid artery stenosis   . CHF (congestive heart failure) ([place])   . Chronic kidney disease    renal insufficiency - DR Florene Glen yearly  . Diabetes mellitus without complication ([place])    type 2  . Full dentures   . History of hiatal hernia   . Hypertension   . Mobility impaired    uses walker for ambulation  . Stroke [facility])    right sided weakness,walker  . Wears glasses     Patient Active Problem List   Diagnosis Date Noted  . Chest pain 12/21/2015  . ACS (acute coronary syndrome) ([place]) 12/21/2015  . Bowel obstruction ([place]) 10/15/2013  . Angioedema due to seafood allergy 09/11/2013  . Nipple discharge, bloody 01/07/2012    Past Surgical History:  Procedure Laterality Date  . ABDOMINAL HYSTERECTOMY    . abdominal pain, RLQ    . angiodysplasia    . APPENDECTOMY    . BREAST DUCTAL SYSTEM EXCISION  03/02/2012   Procedure: EXCISION DUCTAL SYSTEM BREAST;  Surgeon: Adin Hector, MD;  Location: [place];  Service: General;  Laterality: Left;  . BREAST SURGERY     bx rt  and lt   . CATARACT EXTRACTION     left  . CHOLECYSTECTOMY    . COLONOSCOPY N/A 10/15/2012   Procedure: COLONOSCOPY;  Surgeon: Beryle Beams, MD;  [facility];  Service: Endoscopy;  Laterality: N/A;  . constipation    . DILATION AND CURETTAGE OF UTERUS    . DVT    . ENTEROSCOPY N/A 11/17/2014   Procedure: ENTEROSCOPY;  Surgeon: Carol Ada, MD;  [facility];  Service: Endoscopy;  Laterality: N/A;  . ESOPHAGOGASTRODUODENOSCOPY N/A 09/10/2012   Procedure: ESOPHAGOGASTRODUODENOSCOPY (EGD);  Surgeon: Beryle Beams, MD;  Location: [facility];  Service: Endoscopy;  Laterality: N/A;  . EYE SURGERY     right-glaucoma  . HOT HEMOSTASIS N/A 09/10/2012   Procedure: HOT HEMOSTASIS (ARGON PLASMA COAGULATION/BICAP);  Surgeon: Beryle Beams, MD;  Location: [facility];  Service: Endoscopy;  Laterality: N/A;  . HOT HEMOSTASIS N/A 11/17/2014   Procedure: HOT HEMOSTASIS (ARGON PLASMA COAGULATION/BICAP);  Surgeon: Carol Ada, MD;  Location: [facility];  Service: Endoscopy;  Laterality: N/A;  . Nausea & Vomiting    . rectal bleeding    . TUBAL LIGATION      OB History    No data available       Home Medications    Prior to  Admission medications   Medication Sig Start Date End Date Taking? Authorizing Provider  amLODipine-valsartan (EXFORGE) 5-320 MG tablet Take 1 tablet by mouth daily.  01/14/16  Yes [provider]  atorvastatin (LIPITOR) 40 MG tablet Take 40 mg by mouth daily.   Yes [provider]  bimatoprost (LUMIGAN) 0.01 % SOLN Place 1 drop into both eyes at bedtime.   Yes [provider]  brimonidine (ALPHAGAN P) 0.1 % SOLN Place 1 drop into both eyes every 8 (eight) hours.    Yes [provider]  dorzolamide (TRUSOPT) 2 % ophthalmic solution Place 1 drop into both eyes 2 (two) times daily.   Yes [provider]  ergocalciferol (VITAMIN D2) 50000 units capsule Take 50,000 Units by mouth once a week.   Yes [provider]  Febuxostat (ULORIC) 80 MG TABS Take 80 mg by mouth daily.   Yes [provider]  fentaNYL (DURAGESIC - DOSED MCG/HR) 12 MCG/HR Place 12 mcg onto the skin every 3 (three) days.  01/13/16  Yes [provider]  insulin aspart protamine- aspart (NOVOLOG MIX 70/30) (70-30) 100 UNIT/ML injection Inject 20-45 Units into the skin See admin instructions. 45 units in am, 20 units in pm   Yes [provider]  insulin aspart protamine- aspart (NOVOLOG MIX 70/30) (70-30) 100 UNIT/ML injection Inject 20-45 Units into the skin See admin instructions. 45 units in am, 20 units in pm   Yes [provider]  loperamide (IMODIUM) 2 MG capsule Take 2 mg by mouth as needed for diarrhea or loose stools.   Yes [provider]  metoprolol tartrate (LOPRESSOR) 25 MG tablet Take 0.5 tablets (12.5 mg total) by mouth 2 (two) times daily. Patient taking differently: Take 25 mg by mouth daily.  12/23/15  Yes [name], MD  Netarsudil Dimesylate (RHOPRESSA) 0.02 % SOLN Place 1 drop into both eyes at bedtime.   Yes [provider]  nitroGLYCERIN (NITRODUR - DOSED IN MG/24 HR) 0.4 mg/hr patch 0.4 mg daily.  01/21/16  Yes [provider]  nitroGLYCERIN (NITROSTAT) 0.4 MG SL tablet Place 1 tablet (0.4 mg total) under the tongue every 5 (five) minutes as needed for chest pain (CP or SOB). 12/23/15  Yes [name], MD  omeprazole (PRILOSEC) 40 MG capsule Take 40 mg by mouth daily.    Yes [provider]  sitaGLIPtin-metformin (JANUMET) 50-500 MG tablet Take 1 tablet by mouth daily.   Yes [provider]  torsemide (DEMADEX) 20 MG tablet Take 20 mg by mouth daily.   Yes [provider]  clopidogrel (PLAVIX) 75 MG tablet Take 1 tablet (75 mg total) by mouth daily. Patient not taking: Reported on 11/27/2016 12/24/15   [name], MD    Family History Family History  Problem Relation Age of Onset  . Heart disease Sister     . Heart disease Brother     Social History Social History  Substance Use Topics  . Smoking status: Former Smoker    Quit date: 02/25/2002  . Smokeless tobacco: Never Used  . Alcohol use No     Allergies   Aspirin; Blue dyes (parenteral); Contrast media [iodinated diagnostic agents]; Ibuprofen; Iohexol; and Shellfish-derived products   Review of Systems Review of Systems  All other systems reviewed and are negative.    Physical Exam Updated Vital Signs BP (!) 140/57   Pulse 64   Temp 98.5 F (36.9 C) (Oral)   Resp 14   Ht 5\' 7"  (1.702  m)   Wt 83.5 kg (184 lb)   SpO2 100%   BMI 28.82 kg/m   Physical Exam  Constitutional: She is oriented to person, place, and time. She appears well-developed. No distress.  Elderly, frail  HENT:  Head: Normocephalic and atraumatic.  Eyes: Pupils are equal, round, and reactive to light. Conjunctivae and EOM are normal.  Neck: Normal range of motion and phonation normal. Neck supple.  Cardiovascular: Normal rate and regular rhythm.   Pulmonary/Chest: Effort normal and breath sounds normal. She exhibits no tenderness.  Abdominal: Soft. She exhibits no distension. There is tenderness (Epigastric, mild). There is no guarding.  Genitourinary:  Genitourinary Comments: Normal anus, without hemorrhoids.  Rectal examination reveals a small amount of brown stool without mass or tenderness.  Musculoskeletal: Normal range of motion.  Neurological: She is alert and oriented to person, place, and time. She exhibits normal muscle tone.  Skin: Skin is warm and dry.  Psychiatric: She has a normal mood and affect. Her behavior is normal. Judgment and thought content normal.  Nursing note and vitals reviewed.    ED Treatments / Results  Labs (all labs ordered are listed, but only abnormal results are displayed) Labs Reviewed  COMPREHENSIVE METABOLIC PANEL - Abnormal; Notable for the following:       Result Value   Glucose, Bld 148 (*)     Total Protein 8.2 (*)    GFR calc non Af Amer 51 (*)    GFR calc Af Amer 59 (*)    All other components within normal limits  URINALYSIS, ROUTINE W REFLEX MICROSCOPIC - Abnormal; Notable for the following:    Protein, ur 100 (*)    Leukocytes, UA TRACE (*)    Bacteria, UA FEW (*)    Squamous Epithelial / LPF 0-5 (*)    All other components within normal limits  LIPASE, BLOOD  CBC  POC OCCULT BLOOD, ED    EKG  EKG Interpretation  Date/Time:  Thursday November 27 2016 07:45:03 EDT Ventricular Rate:  62 PR Interval:    QRS Duration: 82 QT Interval:  428 QTC Calculation: 435 R Axis:   30 Text Interpretation:  Sinus rhythm Low voltage, precordial leads Baseline wander in lead(s) V2 since last tracing no significant change Confirmed by Daleen Bo [phone]) on 11/27/2016 7:50:19 AM       Radiology Dg Abd Acute W/chest  Result Date: 11/27/2016 CLINICAL DATA:  Onset of abdominal pain and nausea and vomiting around midnight last night. History of small-bowel obstruction. EXAM: DG ABDOMEN ACUTE W/ 1V CHEST COMPARISON:  Abdominal series of October 18, 2013 and CT scan of the abdomen and pelvis of October 15, 2013. FINDINGS: The lungs are reasonably well inflated. There is subtle increased density at the left lung base. The heart is top-normal in size. The pulmonary vascularity is not engorged. There is calcification in the wall of the aortic arch. Within the abdomen there are exuberant calcifications that project over the lateral aspects of the pelvis in the gluteal musculature which are stable. The bowel gas pattern is normal. There surgical clips in the gallbladder fossa. There are degenerative disc changes in the lower lumbar spine. An inferior vena caval filter is present at approximately L2. IMPRESSION: No acute intra-abdominal abnormality is observed. There is density at the left lung base which is not new and may reflect scarring. Thoracic aortic atherosclerosis. Electronically Signed    By: David  Martinique M.D.   On: 11/27/2016 09:04    Procedures Procedures (including  critical care time)  Medications Ordered in ED Medications  fentaNYL (SUBLIMAZE) injection 50 mcg (50 mcg Intravenous Given 11/27/16 1051)  acetaminophen (TYLENOL) tablet 650 mg (650 mg Oral Refused 11/27/16 1443)  ondansetron (ZOFRAN) injection 4 mg (4 mg Intravenous Given 11/27/16 2878)     Initial Impression / Assessment and Plan / ED Course  I have reviewed the triage vital signs and the nursing notes.  Pertinent labs & imaging results that were available during my care of the patient were reviewed by me and considered in my medical decision making (see chart for details).      Patient Vitals for the past 24 hrs:  BP Temp Temp src Pulse Resp SpO2 Height Weight  11/27/16 1545 (!) 140/57 - - 64 14 100 % - -  11/27/16 1354 (!) 151/67 98.5 F (36.9 C) Oral 60 (!) 24 95 % - -  11/27/16 1100 100/75 - - 62 17 99 % - -  11/27/16 1051 (!) 146/70 - - 60 18 - - -  11/27/16 1015 128/77 - - 65 17 100 % - -  11/27/16 0947 (!) 142/107 98.7 F (37.1 C) Oral 68 18 100 % - -  11/27/16 0945 (!) 142/107 - - 74 19 98 % - -  11/27/16 0915 (!) 171/105 - - 69 (!) 21 98 % - -  11/27/16 0845 (!) 146/115 - - 63 16 95 % - -  11/27/16 0815 (!) 162/74 - - 69 18 99 % - -  11/27/16 0753 (!) 144/47 98 F (36.7 C) Oral 66 20 100 % - -  11/27/16 0743 - - - - - - 5\' 7"  (1.702 m) 83.5 kg (184 lb)  11/27/16 0738 - - - - - 100 % - -    4:04 PM Reevaluation with update and discussion. After initial assessment and treatment, an updated evaluation reveals patient is tolerating oral liquids, and vital signs are reassuring.  Findings and plan discussed with patient and all questions were answered. Mahira Petras L      Final Clinical Impressions(s) / ED Diagnoses   Final diagnoses:  Generalized abdominal pain   Nonspecific abdominal pain with nausea and vomiting.  Doubt bowel obstruction, serious bacterial infection or  metabolic instability.  Nursing Notes Reviewed/ Care Coordinated Applicable Imaging Reviewed Interpretation of Laboratory Data incorporated into ED treatment  The patient appears reasonably screened and/or stabilized for discharge and I doubt any other medical condition or other [facility] requiring further screening, evaluation, or treatment in the ED at this time prior to discharge.  Plan: Home Medications-continue current medication, use Tylenol if needed for pain; Home Treatments-rest, fluids, gradually advance diet; return here if the recommended treatment, does not improve the symptoms; Recommended follow up-PCP checkup 1 week and as needed   New Prescriptions New Prescriptions   No medications on file     Daleen Bo, MD 11/27/16 1606

## 2016-11-27 NOTE — ED Triage Notes (Signed)
Pt to ED via [facility] from home with c/o severe abd pain, started at 1am-- pt has been constipated for 2 days, -- hx of bowel obstruction in past-- states was treated with NGT.

## 2016-11-27 NOTE — Discharge Instructions (Signed)
The testing today was reassuring.  There is no sign of abdominal obstruction, or serious infection.  To improve your condition you should stay on a clear liquid diet for 1 or 2 days then gradually advance, to bland foods.  If that goes well, resume eating normal foods, after a couple of days.  For pain, we recommend you take Tylenol every 4 hours.  Follow-up with your primary care doctor for a checkup next week.  Return here, if needed, for problems.

## 2016-11-27 NOTE — ED Notes (Signed)
Pt c/o pain in abd-- 10/10

## 2017-01-29 ENCOUNTER — Encounter: Payer: Self-pay | Admitting: Family

## 2017-01-29 ENCOUNTER — Ambulatory Visit (INDEPENDENT_AMBULATORY_CARE_PROVIDER_SITE_OTHER): Payer: Medicare Other | Admitting: Family

## 2017-01-29 ENCOUNTER — Ambulatory Visit (HOSPITAL_COMMUNITY)
Admission: RE | Admit: 2017-01-29 | Discharge: 2017-01-29 | Disposition: A | Discharge status: Home / Self Care | Payer: Medicare Other | Source: Ambulatory Visit | Attending: Vascular Surgery | Admitting: Vascular Surgery

## 2017-01-29 VITALS — BP 145/75 | HR 60 | Temp 98.4°F | Resp 17 | Wt 198.9 lb

## 2017-01-29 DIAGNOSIS — I6523 Occlusion and stenosis of bilateral carotid arteries: Secondary | ICD-10-CM

## 2017-01-29 DIAGNOSIS — Z9889 Other specified postprocedural states: Secondary | ICD-10-CM | POA: Diagnosis not present

## 2017-01-29 DIAGNOSIS — I6521 Occlusion and stenosis of right carotid artery: Secondary | ICD-10-CM | POA: Diagnosis not present

## 2017-01-29 DIAGNOSIS — I6522 Occlusion and stenosis of left carotid artery: Secondary | ICD-10-CM | POA: Diagnosis present

## 2017-01-29 LAB — VAS US CAROTID
LCCADDIAS: 16 cm/s
LCCADSYS: 66 cm/s
LCCAPDIAS: 16 cm/s
LEFT ECA DIAS: -9 cm/s
LICADDIAS: -18 cm/s
Left CCA prox sys: 92 cm/s
Left ICA dist sys: -58 cm/s
Left ICA prox dias: -17 cm/s
Left ICA prox sys: -60 cm/s
RCCADSYS: -45 cm/s
RCCAPDIAS: 14 cm/s
RIGHT CCA MID DIAS: 17 cm/s
Right CCA prox sys: 63 cm/s

## 2017-01-29 NOTE — Progress Notes (Signed)
Chief Complaint: Follow up Extracranial Carotid Artery Stenosis   History of Present Illness  Katherine [name] is a 83 y.o. female who is s/p left carotid endarterectomy in 2003 by Dr. Donnetta Hutching.  She had a preoperative neurologic event as manifested by speech difficulties left hemiparesis, and had good return of neurologic function. She has had no focal neurologic deficits since that time.   Dr. Donnetta Hutching last evaluated pt on 01-29-16. At that time carotid duplex from 11/19/2015 at [employer] was reviewed which revealed no evidence of recurrent stenosis in her left carotid endarterectomy site. She had moderate 50-60% right carotid stenosis Dr. Donnetta Hutching explained to pt that she did not have any indication for treatment. She's had a very nice durable result from her endarterectomy in her left carotid for symptomatic disease in 2003. Dr. Donnetta Hutching recommended yearly carotid duplex to rule out any progression of stenosis.  She has congestive heart failure.  She walks with a walker since her stroke in 2003.   She had normal ABI's in October 2017.   She uses Fentanyl patches for right hip pain; right hips pain was evaluated, was found to be due to adhesions from her multiple abdominal surgeries, per pt. She indicates a dehiscence of abdominal incision, which currently is well healed, has scarring and puckering of abdominal tissue.   Pt Diabetic: yes, last A1C result on file was 10.2 in October 2017, uncontrolled Pt smoker: former smoker, quit in 2003 when she had the stroke, started at age 7 years  Pt meds include: Statin : yes ASA: no Other anticoagulants/antiplatelets: Pt states Plavix was stopped due to small GI bleed found on endoscopy   Past Medical History:  Diagnosis Date  . Anemia    mild  . Atrial fibrillation ([place])   . Bloody discharge from nipple   . Bowel obstruction ([place])   . CAD (coronary artery disease)   . Carotid artery stenosis   . CHF (congestive heart failure) ([place])   .  Chronic kidney disease    renal insufficiency - DR Florene Glen yearly  . Diabetes mellitus without complication ([place])    type 2  . Full dentures   . History of hiatal hernia   . Hypertension   . Mobility impaired    uses walker for ambulation  . Stroke [facility])    right sided weakness,walker  . Wears glasses     Social History Social History   Tobacco Use  . Smoking status: Former Smoker    Last attempt to quit: 02/25/2002    Years since quitting: 14.9  . Smokeless tobacco: Never Used  Substance Use Topics  . Alcohol use: No  . Drug use: No    Family History Family History  Problem Relation Age of Onset  . Heart disease Sister   . Heart disease Brother   . Heart disease Mother   . Heart disease Father   . Heart disease Daughter     Surgical History Past Surgical History:  Procedure Laterality Date  . ABDOMINAL HYSTERECTOMY    . abdominal pain, RLQ    . angiodysplasia    . APPENDECTOMY    . BREAST DUCTAL SYSTEM EXCISION  03/02/2012   Procedure: EXCISION DUCTAL SYSTEM BREAST;  Surgeon: Adin Hector, MD;  Location: [place];  Service: General;  Laterality: Left;  . BREAST SURGERY     bx rt and lt   . CATARACT EXTRACTION     left  . CHOLECYSTECTOMY    .  COLONOSCOPY N/A 10/15/2012   Procedure: COLONOSCOPY;  Surgeon: Beryle Beams, MD;  [facility];  Service: Endoscopy;  Laterality: N/A;  . constipation    . DILATION AND CURETTAGE OF UTERUS    . DVT    . ENTEROSCOPY N/A 11/17/2014   Procedure: ENTEROSCOPY;  Surgeon: Carol Ada, MD;  [facility];  Service: Endoscopy;  Laterality: N/A;  . ESOPHAGOGASTRODUODENOSCOPY N/A 09/10/2012   Procedure: ESOPHAGOGASTRODUODENOSCOPY (EGD);  Surgeon: Beryle Beams, MD;  Location[facility];  Service: Endoscopy;  Laterality: N/A;  . EYE SURGERY     right-glaucoma  . HOT HEMOSTASIS N/A 09/10/2012   Procedure: HOT HEMOSTASIS (ARGON PLASMA COAGULATION/BICAP);  Surgeon: Beryle Beams, MD;   [facility];  Service: Endoscopy;  Laterality: N/A;  . HOT HEMOSTASIS N/A 11/17/2014   Procedure: HOT HEMOSTASIS (ARGON PLASMA COAGULATION/BICAP);  Surgeon: Carol Ada, MD;  [facility];  Service: Endoscopy;  Laterality: N/A;  . Nausea & Vomiting    . rectal bleeding    . TUBAL LIGATION      Allergies  Allergen Reactions  . Aspirin Hives  . Blue Dyes (Parenteral) Hives  . Contrast Media [Iodinated Diagnostic Agents] Other (See Comments)    unknown  . Ibuprofen Hives  . Iohexol Other (See Comments)    unknown  . Shellfish-Derived Products Hives    Current Outpatient Medications  Medication Sig Dispense Refill  . amLODipine-valsartan (EXFORGE) 5-320 MG tablet Take 1 tablet by mouth daily.     Marland Kitchen atorvastatin (LIPITOR) 40 MG tablet Take 40 mg by mouth daily.    . bimatoprost (LUMIGAN) 0.01 % SOLN Place 1 drop into both eyes at bedtime.    . brimonidine (ALPHAGAN P) 0.1 % SOLN Place 1 drop into both eyes every 8 (eight) hours.     . clopidogrel (PLAVIX) 75 MG tablet Take 1 tablet (75 mg total) by mouth daily. 30 tablet 3  . dorzolamide (TRUSOPT) 2 % ophthalmic solution Place 1 drop into both eyes 2 (two) times daily.    . ergocalciferol (VITAMIN D2) 50000 units capsule Take 50,000 Units by mouth once a week.    . Febuxostat (ULORIC) 80 MG TABS Take 80 mg by mouth daily.    . fentaNYL (DURAGESIC - DOSED MCG/HR) 12 MCG/HR Place 12 mcg onto the skin every 3 (three) days.     . insulin aspart protamine- aspart (NOVOLOG MIX 70/30) (70-30) 100 UNIT/ML injection Inject 20-45 Units into the skin See admin instructions. 45 units in am, 20 units in pm    . insulin aspart protamine- aspart (NOVOLOG MIX 70/30) (70-30) 100 UNIT/ML injection Inject 20-45 Units into the skin See admin instructions. 45 units in am, 20 units in pm    . loperamide (IMODIUM) 2 MG capsule Take 2 mg by mouth as needed for diarrhea or loose stools.    . metoprolol tartrate (LOPRESSOR) 25 MG tablet Take  0.5 tablets (12.5 mg total) by mouth 2 (two) times daily. (Patient taking differently: Take 25 mg by mouth daily. ) 30 tablet 3  . Netarsudil Dimesylate (RHOPRESSA) 0.02 % SOLN Place 1 drop into both eyes at bedtime.    . nitroGLYCERIN (NITRODUR - DOSED IN MG/24 HR) 0.4 mg/hr patch 0.4 mg daily.     . nitroGLYCERIN (NITROSTAT) 0.4 MG SL tablet Place 1 tablet (0.4 mg total) under the tongue every 5 (five) minutes as needed for chest pain (CP or SOB). 25 tablet 12  . omeprazole (PRILOSEC) 40 MG capsule Take 40  mg by mouth daily.     . potassium chloride SA (K-DUR,KLOR-CON) 20 MEQ tablet     . sitaGLIPtin-metformin (JANUMET) 50-500 MG tablet Take 1 tablet by mouth daily.    Marland Kitchen torsemide (DEMADEX) 20 MG tablet Take 20 mg by mouth daily.     No current facility-administered medications for this visit.     Review of Systems : See HPI for pertinent positives and negatives.  Physical Examination  Vitals:   01/29/17 1141 01/29/17 1142  BP: (!) 151/78 (!) 145/75  Pulse: 60   Resp: 17   Temp: 98.4 F (36.9 C)   TempSrc: Oral   SpO2: 98%   Weight: 198 lb 14.4 oz (90.2 kg)    Body mass index is 31.15 kg/m.  General: WDWN obese female in NAD GAIT: slow, steady, using walker Eyes: PERRLA Pulmonary:  Respirations are non-labored, good air movement, CTAB, no rales, no rhonchi, or  wheezing.  Cardiac: regular rhythm, + murmur.  VASCULAR EXAM Carotid Bruits Right Left   Possible transmitted cardiac murmur  Possible transmitted cardiac murmur       Abdominal aortic pulse is not palpable. Radial pulses are 2+ palpable and equal.                                                                                                                            LE Pulses Right Left       FEMORAL  1+ palpable  1+ palpable        POPLITEAL  not palpable   not palpable       POSTERIOR TIBIAL  not palpable   not palpable        DORSALIS PEDIS      ANTERIOR TIBIAL 1+ palpable  1+ palpable      Gastrointestinal: soft, nontender, BS WNL, no r/g, scarring and puckering of tissue at well healed abdominal previous incision dehiscence site .  Musculoskeletal: No muscle atrophy/wasting. M/S 5/5 throughout, extremities without ischemic changes.  Skin: No rashes, no ulcers, no cellulitis.    Neurologic:  A&O X 3; appropriate affect, sensation is normal; speech is mildly expressively aphasic, CN 2-12 intact, pain and light touch intact in extremities, motor exam as listed above.    Assessment: TIANNA BAUS is a 83 y.o. female who is s/p left carotid endarterectomy in 2003. She had a preoperative stroke, has mild expressive aphasia, needs a walker to walk since her stroke. She has had no subsequent neurological events.   Her atherosclerotic risk factors include DM (possibly uncontrolled), former smoker x 50 years, CAD, and obesity.  DATA Carotid Duplex (01/29/17): Right ICA: 1-39% stenosis Left ICA: CEA site with no significant stenosis. Mild calcific plaque in the surgical bulb. Bilateral vertebral artery flow is antegrade.  Bilateral subclavian artery waveforms are normal.  No previous exam available for comparison (previous exams in paper records before 2012).    Plan: Follow-up in 1 year with Carotid Duplex scan.  I  discussed in depth with the patient the nature of atherosclerosis, and emphasized the importance of maximal medical management including strict control of blood pressure, blood glucose, and lipid levels, obtaining regular exercise, and continued cessation of smoking.  The patient is aware that without maximal medical management the underlying atherosclerotic disease process will progress, limiting the benefit of any interventions. The patient was given information about stroke prevention and what symptoms should prompt the patient to seek immediate medical care. Thank you for allowing [place] to participate in this patient's care.  Clemon Chambers, RN, MSN,  FNP-C [facility] Office: [phone]  Clinic Physician: Oneida Alar  01/29/17 11:46 AM

## 2017-01-29 NOTE — Patient Instructions (Signed)

## 2017-02-23 NOTE — Addendum Note (Signed)
Addended by: Lianne Cure A on: 02/23/2017 10:53 AM   Modules accepted: Orders

## 2017-05-30 ENCOUNTER — Emergency Department (HOSPITAL_COMMUNITY): Payer: Medicare Other

## 2017-05-30 ENCOUNTER — Encounter (HOSPITAL_COMMUNITY): Payer: Self-pay | Admitting: Emergency Medicine

## 2017-05-30 ENCOUNTER — Inpatient Hospital Stay (HOSPITAL_COMMUNITY)
Admission: EM | Admit: 2017-05-30 | Discharge: 2017-06-04 | DRG: 392 | Disposition: A | Discharge status: Home Health | Payer: Medicare Other | Attending: Cardiology | Admitting: Cardiology

## 2017-05-30 ENCOUNTER — Other Ambulatory Visit: Payer: Self-pay

## 2017-05-30 DIAGNOSIS — Z79899 Other long term (current) drug therapy: Secondary | ICD-10-CM | POA: Diagnosis not present

## 2017-05-30 DIAGNOSIS — Z86010 Personal history of colonic polyps: Secondary | ICD-10-CM

## 2017-05-30 DIAGNOSIS — R112 Nausea with vomiting, unspecified: Secondary | ICD-10-CM | POA: Diagnosis not present

## 2017-05-30 DIAGNOSIS — H409 Unspecified glaucoma: Secondary | ICD-10-CM | POA: Diagnosis present

## 2017-05-30 DIAGNOSIS — Z886 Allergy status to analgesic agent status: Secondary | ICD-10-CM

## 2017-05-30 DIAGNOSIS — E860 Dehydration: Secondary | ICD-10-CM | POA: Diagnosis not present

## 2017-05-30 DIAGNOSIS — R1031 Right lower quadrant pain: Secondary | ICD-10-CM | POA: Diagnosis not present

## 2017-05-30 DIAGNOSIS — Z794 Long term (current) use of insulin: Secondary | ICD-10-CM | POA: Diagnosis not present

## 2017-05-30 DIAGNOSIS — R011 Cardiac murmur, unspecified: Secondary | ICD-10-CM | POA: Diagnosis present

## 2017-05-30 DIAGNOSIS — E669 Obesity, unspecified: Secondary | ICD-10-CM | POA: Diagnosis present

## 2017-05-30 DIAGNOSIS — Z86718 Personal history of other venous thrombosis and embolism: Secondary | ICD-10-CM | POA: Diagnosis not present

## 2017-05-30 DIAGNOSIS — K760 Fatty (change of) liver, not elsewhere classified: Secondary | ICD-10-CM | POA: Diagnosis present

## 2017-05-30 DIAGNOSIS — K224 Dyskinesia of esophagus: Secondary | ICD-10-CM | POA: Diagnosis present

## 2017-05-30 DIAGNOSIS — E1122 Type 2 diabetes mellitus with diabetic chronic kidney disease: Secondary | ICD-10-CM | POA: Diagnosis present

## 2017-05-30 DIAGNOSIS — I69351 Hemiplegia and hemiparesis following cerebral infarction affecting right dominant side: Secondary | ICD-10-CM

## 2017-05-30 DIAGNOSIS — D509 Iron deficiency anemia, unspecified: Secondary | ICD-10-CM | POA: Diagnosis present

## 2017-05-30 DIAGNOSIS — I5022 Chronic systolic (congestive) heart failure: Secondary | ICD-10-CM | POA: Diagnosis present

## 2017-05-30 DIAGNOSIS — Z973 Presence of spectacles and contact lenses: Secondary | ICD-10-CM

## 2017-05-30 DIAGNOSIS — K225 Diverticulum of esophagus, acquired: Secondary | ICD-10-CM | POA: Diagnosis present

## 2017-05-30 DIAGNOSIS — Z888 Allergy status to other drugs, medicaments and biological substances status: Secondary | ICD-10-CM

## 2017-05-30 DIAGNOSIS — K219 Gastro-esophageal reflux disease without esophagitis: Secondary | ICD-10-CM | POA: Diagnosis present

## 2017-05-30 DIAGNOSIS — Z91013 Allergy to seafood: Secondary | ICD-10-CM

## 2017-05-30 DIAGNOSIS — K56609 Unspecified intestinal obstruction, unspecified as to partial versus complete obstruction: Secondary | ICD-10-CM

## 2017-05-30 DIAGNOSIS — Z95828 Presence of other vascular implants and grafts: Secondary | ICD-10-CM

## 2017-05-30 DIAGNOSIS — I872 Venous insufficiency (chronic) (peripheral): Secondary | ICD-10-CM | POA: Diagnosis present

## 2017-05-30 DIAGNOSIS — G8929 Other chronic pain: Secondary | ICD-10-CM | POA: Diagnosis present

## 2017-05-30 DIAGNOSIS — I130 Hypertensive heart and chronic kidney disease with heart failure and stage 1 through stage 4 chronic kidney disease, or unspecified chronic kidney disease: Secondary | ICD-10-CM | POA: Diagnosis present

## 2017-05-30 DIAGNOSIS — E1140 Type 2 diabetes mellitus with diabetic neuropathy, unspecified: Secondary | ICD-10-CM | POA: Diagnosis present

## 2017-05-30 DIAGNOSIS — I2510 Atherosclerotic heart disease of native coronary artery without angina pectoris: Secondary | ICD-10-CM | POA: Diagnosis present

## 2017-05-30 DIAGNOSIS — E785 Hyperlipidemia, unspecified: Secondary | ICD-10-CM | POA: Diagnosis present

## 2017-05-30 DIAGNOSIS — Z90710 Acquired absence of both cervix and uterus: Secondary | ICD-10-CM

## 2017-05-30 DIAGNOSIS — R1011 Right upper quadrant pain: Secondary | ICD-10-CM | POA: Diagnosis not present

## 2017-05-30 DIAGNOSIS — I4891 Unspecified atrial fibrillation: Secondary | ICD-10-CM | POA: Diagnosis present

## 2017-05-30 DIAGNOSIS — Z9049 Acquired absence of other specified parts of digestive tract: Secondary | ICD-10-CM

## 2017-05-30 DIAGNOSIS — Z91048 Other nonmedicinal substance allergy status: Secondary | ICD-10-CM

## 2017-05-30 DIAGNOSIS — Z9842 Cataract extraction status, left eye: Secondary | ICD-10-CM | POA: Diagnosis not present

## 2017-05-30 DIAGNOSIS — R109 Unspecified abdominal pain: Secondary | ICD-10-CM

## 2017-05-30 DIAGNOSIS — R197 Diarrhea, unspecified: Secondary | ICD-10-CM | POA: Diagnosis not present

## 2017-05-30 DIAGNOSIS — Z7902 Long term (current) use of antithrombotics/antiplatelets: Secondary | ICD-10-CM

## 2017-05-30 DIAGNOSIS — N189 Chronic kidney disease, unspecified: Secondary | ICD-10-CM | POA: Diagnosis present

## 2017-05-30 DIAGNOSIS — K529 Noninfective gastroenteritis and colitis, unspecified: Principal | ICD-10-CM | POA: Diagnosis present

## 2017-05-30 DIAGNOSIS — I69320 Aphasia following cerebral infarction: Secondary | ICD-10-CM

## 2017-05-30 DIAGNOSIS — Z6826 Body mass index (BMI) 26.0-26.9, adult: Secondary | ICD-10-CM

## 2017-05-30 DIAGNOSIS — R1084 Generalized abdominal pain: Secondary | ICD-10-CM | POA: Diagnosis not present

## 2017-05-30 DIAGNOSIS — M1990 Unspecified osteoarthritis, unspecified site: Secondary | ICD-10-CM | POA: Diagnosis present

## 2017-05-30 DIAGNOSIS — Z91041 Radiographic dye allergy status: Secondary | ICD-10-CM

## 2017-05-30 DIAGNOSIS — Z8249 Family history of ischemic heart disease and other diseases of the circulatory system: Secondary | ICD-10-CM

## 2017-05-30 DIAGNOSIS — Z87891 Personal history of nicotine dependence: Secondary | ICD-10-CM

## 2017-05-30 LAB — COMPREHENSIVE METABOLIC PANEL
ALBUMIN: 4 g/dL (ref 3.5–5.0)
ALT: 17 U/L (ref 14–54)
AST: 23 U/L (ref 15–41)
Alkaline Phosphatase: 76 U/L (ref 38–126)
Anion gap: 12 (ref 5–15)
BUN: 21 mg/dL — AB (ref 6–20)
CHLORIDE: 105 mmol/L (ref 101–111)
CO2: 22 mmol/L (ref 22–32)
CREATININE: 1.11 mg/dL — AB (ref 0.44–1.00)
Calcium: 9.7 mg/dL (ref 8.9–10.3)
GFR calc Af Amer: 51 mL/min — ABNORMAL LOW (ref >60)
GFR, EST NON AFRICAN AMERICAN: 44 mL/min — AB (ref >60)
GLUCOSE: 111 mg/dL — AB (ref 65–99)
Potassium: 4.7 mmol/L (ref 3.5–5.1)
SODIUM: 139 mmol/L (ref 135–145)
Total Bilirubin: 0.7 mg/dL (ref 0.3–1.2)
Total Protein: 8 g/dL (ref 6.5–8.1)

## 2017-05-30 LAB — LIPASE, BLOOD: LIPASE: 44 U/L (ref 11–51)

## 2017-05-30 LAB — URINALYSIS, ROUTINE W REFLEX MICROSCOPIC
BACTERIA UA: NONE SEEN
BILIRUBIN URINE: NEGATIVE
Glucose, UA: NEGATIVE mg/dL
HGB URINE DIPSTICK: NEGATIVE
Ketones, ur: NEGATIVE mg/dL
LEUKOCYTES UA: NEGATIVE
Nitrite: NEGATIVE
PROTEIN: 100 mg/dL — AB
Specific Gravity, Urine: 1.02 (ref 1.005–1.030)
pH: 5 (ref 5.0–8.0)

## 2017-05-30 LAB — CBC
HCT: 45.3 % (ref 36.0–46.0)
Hemoglobin: 14.5 g/dL (ref 12.0–15.0)
MCH: 28.2 pg (ref 26.0–34.0)
MCHC: 32 g/dL (ref 30.0–36.0)
MCV: 88.1 fL (ref 78.0–100.0)
Platelets: 215 10*3/uL (ref 150–400)
RBC: 5.14 MIL/uL — ABNORMAL HIGH (ref 3.87–5.11)
RDW: 14.5 % (ref 11.5–15.5)
WBC: 4.6 10*3/uL (ref 4.0–10.5)

## 2017-05-30 LAB — HEMOGLOBIN A1C
HEMOGLOBIN A1C: 6.4 % — AB (ref 4.8–5.6)
MEAN PLASMA GLUCOSE: 136.98 mg/dL

## 2017-05-30 MED ORDER — ATORVASTATIN CALCIUM 40 MG PO TABS
40.0000 mg | ORAL_TABLET | Freq: Every day | ORAL | Status: DC
  Administered 2017-05-31 – 2017-06-04 (×5): 40 mg via ORAL
  Filled 2017-05-30 (×5): qty 1

## 2017-05-30 MED ORDER — METRONIDAZOLE IN NACL 5-0.79 MG/ML-% IV SOLN
500.0000 mg | Freq: Three times a day (TID) | INTRAVENOUS | Status: DC
  Administered 2017-05-30 – 2017-06-02 (×8): 500 mg via INTRAVENOUS
  Filled 2017-05-30 (×8): qty 100

## 2017-05-30 MED ORDER — SODIUM CHLORIDE 0.9 % IV SOLN
2.0000 g | INTRAVENOUS | Status: DC
Start: 2017-05-30 — End: 2017-06-02
  Administered 2017-05-31 – 2017-06-01 (×3): 2 g via INTRAVENOUS
  Filled 2017-05-30 (×3): qty 20

## 2017-05-30 MED ORDER — DIPHENHYDRAMINE HCL 50 MG/ML IJ SOLN
12.5000 mg | Freq: Three times a day (TID) | INTRAMUSCULAR | Status: DC | PRN
  Administered 2017-05-30: 12.5 mg via INTRAVENOUS
  Filled 2017-05-30: qty 1

## 2017-05-30 MED ORDER — NITROGLYCERIN 0.4 MG SL SUBL: 0.4000 mg | SUBLINGUAL_TABLET | SUBLINGUAL | Status: DC | PRN

## 2017-05-30 MED ORDER — ENOXAPARIN SODIUM 40 MG/0.4ML ~~LOC~~ SOLN
40.0000 mg | SUBCUTANEOUS | Status: DC
  Administered 2017-05-31 – 2017-06-04 (×5): 40 mg via SUBCUTANEOUS
  Filled 2017-05-30 (×5): qty 0.4

## 2017-05-30 MED ORDER — CIPROFLOXACIN IN D5W 400 MG/200ML IV SOLN
400.0000 mg | Freq: Once | INTRAVENOUS | Status: AC
  Administered 2017-05-30: 400 mg via INTRAVENOUS
  Filled 2017-05-30: qty 200

## 2017-05-30 MED ORDER — NETARSUDIL DIMESYLATE 0.02 % OP SOLN: 1.0000 [drp] | Freq: Every day | OPHTHALMIC | Status: DC

## 2017-05-30 MED ORDER — MORPHINE SULFATE (PF) 4 MG/ML IV SOLN
3.0000 mg | Freq: Once | INTRAVENOUS | Status: AC
Start: 2017-05-30 — End: 2017-05-30
  Administered 2017-05-30: 3 mg via INTRAVENOUS
  Filled 2017-05-30: qty 1

## 2017-05-30 MED ORDER — ONDANSETRON HCL 4 MG/2ML IJ SOLN: 4.0000 mg | Freq: Four times a day (QID) | INTRAMUSCULAR | Status: DC | PRN

## 2017-05-30 MED ORDER — ONDANSETRON HCL 4 MG/2ML IJ SOLN
4.0000 mg | Freq: Once | INTRAMUSCULAR | Status: AC
  Administered 2017-05-30: 4 mg via INTRAVENOUS
  Filled 2017-05-30: qty 2

## 2017-05-30 MED ORDER — CLOPIDOGREL BISULFATE 75 MG PO TABS
75.0000 mg | ORAL_TABLET | Freq: Every day | ORAL | Status: DC
  Administered 2017-05-31 – 2017-06-04 (×5): 75 mg via ORAL
  Filled 2017-05-30 (×5): qty 1

## 2017-05-30 MED ORDER — ONDANSETRON HCL 4 MG PO TABS: 4.0000 mg | ORAL_TABLET | Freq: Four times a day (QID) | ORAL | Status: DC | PRN

## 2017-05-30 MED ORDER — METOPROLOL TARTRATE 12.5 MG HALF TABLET
12.5000 mg | ORAL_TABLET | Freq: Two times a day (BID) | ORAL | Status: DC
  Administered 2017-05-30 – 2017-06-01 (×4): 12.5 mg via ORAL
  Filled 2017-05-30 (×4): qty 1

## 2017-05-30 MED ORDER — SODIUM CHLORIDE 0.9 % IV SOLN
INTRAVENOUS | Status: DC
  Administered 2017-05-30 – 2017-05-31 (×2): via INTRAVENOUS
  Administered 2017-06-01: 1000 mL via INTRAVENOUS
  Administered 2017-06-02: 13:00:00 via INTRAVENOUS

## 2017-05-30 MED ORDER — CIPROFLOXACIN IN D5W 400 MG/200ML IV SOLN: 400.0000 mg | Freq: Two times a day (BID) | INTRAVENOUS | Status: DC

## 2017-05-30 MED ORDER — SODIUM CHLORIDE 0.9 % IV SOLN
Freq: Once | INTRAVENOUS | Status: AC
  Administered 2017-05-30: 19:00:00 via INTRAVENOUS

## 2017-05-30 MED ORDER — INSULIN ASPART 100 UNIT/ML ~~LOC~~ SOLN
0.0000 [IU] | Freq: Three times a day (TID) | SUBCUTANEOUS | Status: DC
  Administered 2017-06-02: 2 [IU] via SUBCUTANEOUS
  Administered 2017-06-02 – 2017-06-03 (×3): 1 [IU] via SUBCUTANEOUS
  Administered 2017-06-04: 2 [IU] via SUBCUTANEOUS
  Administered 2017-06-04: 1 [IU] via SUBCUTANEOUS

## 2017-05-30 MED ORDER — METRONIDAZOLE IN NACL 5-0.79 MG/ML-% IV SOLN
500.0000 mg | Freq: Once | INTRAVENOUS | Status: DC
  Filled 2017-05-30: qty 100

## 2017-05-30 MED ORDER — LATANOPROST 0.005 % OP SOLN
1.0000 [drp] | Freq: Every day | OPHTHALMIC | Status: DC
  Administered 2017-05-30 – 2017-06-03 (×5): 1 [drp] via OPHTHALMIC
  Filled 2017-05-30: qty 2.5

## 2017-05-30 MED ORDER — NITROGLYCERIN 0.2 MG/HR TD PT24
0.2000 mg | MEDICATED_PATCH | Freq: Every day | TRANSDERMAL | Status: DC
  Administered 2017-05-31 – 2017-06-04 (×5): 0.2 mg via TRANSDERMAL
  Filled 2017-05-30 (×6): qty 1

## 2017-05-30 MED ORDER — PANTOPRAZOLE SODIUM 40 MG PO TBEC
40.0000 mg | DELAYED_RELEASE_TABLET | Freq: Every day | ORAL | Status: DC
  Administered 2017-05-31 – 2017-06-04 (×5): 40 mg via ORAL
  Filled 2017-05-30 (×5): qty 1

## 2017-05-30 NOTE — ED Notes (Signed)
Patient transported to CT 

## 2017-05-30 NOTE — ED Triage Notes (Signed)
Pt BIB EMS from home. Pt reports n/v/d and lower abdominal pain since 0800 this AM. Afebrile on assessmet. Pt A&Ox4; resp e/u. Pt reports "bowel blockage 2 years ago" and states "it almost feels like that."

## 2017-05-30 NOTE — ED Provider Notes (Signed)
[facility] EMERGENCY DEPARTMENT Provider Note   CSN: [id number] Arrival date & time: 05/30/17  1724     History   Chief Complaint Chief Complaint  Patient presents with  . Abdominal Pain  . Emesis    HPI Katherine [name] is a 84 y.o. female.  HPI 84 year old female with past medical history as below including CAD, CHF, A. fib, here with abdominal pain.  Patient also has a history of multiple intra-abdominal surgeries with history of bowel obstruction.  She states that starting last night, she began having increased frequency of bowel movements.  Throughout the day today, she is had nausea, diffuse abdominal pain, vomiting, and intermittent loose stools.  She states that she has been unable to eat or drink throughout the day.  Her symptoms have progressively worsened.  She describes abdominal pain is an aching, throbbing, lower abdominal pain.  She feels generalized abdominal swelling as well.  She began vomiting earlier today and has vomited nonstop since then.  She is also had intermittent loose stools.  She is passing flatus.  No fevers no chills.  No urinary symptoms.  No vaginal bleeding or discharge.  Past Medical History:  Diagnosis Date  . Anemia    mild  . Atrial fibrillation ([place])   . Bloody discharge from nipple   . Bowel obstruction ([place])   . CAD (coronary artery disease)   . Carotid artery stenosis   . CHF (congestive heart failure) ([place])   . Chronic kidney disease    renal insufficiency - DR Florene Glen yearly  . Diabetes mellitus without complication ([place])    type 2  . Full dentures   . History of hiatal hernia   . Hypertension   . Mobility impaired    uses walker for ambulation  . Stroke [facility])    right sided weakness,walker  . Wears glasses     Patient Active Problem List   Diagnosis Date Noted  . Chest pain 12/21/2015  . ACS (acute coronary syndrome) ([place]) 12/21/2015  . Bowel obstruction ([place]) 10/15/2013  . Angioedema due to seafood  allergy 09/11/2013  . Nipple discharge, bloody 01/07/2012    Past Surgical History:  Procedure Laterality Date  . ABDOMINAL HYSTERECTOMY    . abdominal pain, RLQ    . angiodysplasia    . APPENDECTOMY    . BREAST DUCTAL SYSTEM EXCISION  03/02/2012   Procedure: EXCISION DUCTAL SYSTEM BREAST;  Surgeon: Adin Hector, MD;  Location: [facility];  Service: General;  Laterality: Left;  . BREAST SURGERY     bx rt and lt   . CATARACT EXTRACTION     left  . CHOLECYSTECTOMY    . COLONOSCOPY N/A 10/15/2012   Procedure: COLONOSCOPY;  Surgeon: Beryle Beams, MD;  Location: [facility];  Service: Endoscopy;  Laterality: N/A;  . constipation    . DILATION AND CURETTAGE OF UTERUS    . DVT    . ENTEROSCOPY N/A 11/17/2014   Procedure: ENTEROSCOPY;  Surgeon: Carol Ada, MD;  [facility];  Service: Endoscopy;  Laterality: N/A;  . ESOPHAGOGASTRODUODENOSCOPY N/A 09/10/2012   Procedure: ESOPHAGOGASTRODUODENOSCOPY (EGD);  Surgeon: Beryle Beams, MD;  Location: [facility];  Service: Endoscopy;  Laterality: N/A;  . EYE SURGERY     right-glaucoma  . HOT HEMOSTASIS N/A 09/10/2012   Procedure: HOT HEMOSTASIS (ARGON PLASMA COAGULATION/BICAP);  Surgeon: Beryle Beams, MD;  Location: [facility];  Service: Endoscopy;  Laterality: N/A;  . HOT HEMOSTASIS N/A 11/17/2014  Procedure: HOT HEMOSTASIS (ARGON PLASMA COAGULATION/BICAP);  Surgeon: Carol Ada, MD;  Location: [facility];  Service: Endoscopy;  Laterality: N/A;  . Nausea & Vomiting    . rectal bleeding    . TUBAL LIGATION       OB History   None      Home Medications    Prior to Admission medications   Medication Sig Start Date End Date Taking? Authorizing Provider  amLODipine-valsartan (EXFORGE) 5-320 MG tablet Take 1 tablet by mouth daily.  01/14/16   [provider]  atorvastatin (LIPITOR) 40 MG tablet Take 40 mg by mouth daily.    [provider]  bimatoprost (LUMIGAN) 0.01 % SOLN Place 1  drop into both eyes at bedtime.    [provider]  brimonidine (ALPHAGAN P) 0.1 % SOLN Place 1 drop into both eyes every 8 (eight) hours.     [provider]  clopidogrel (PLAVIX) 75 MG tablet Take 1 tablet (75 mg total) by mouth daily. 12/24/15   [name], MD  dorzolamide (TRUSOPT) 2 % ophthalmic solution Place 1 drop into both eyes 2 (two) times daily.    [provider]  ergocalciferol (VITAMIN D2) 50000 units capsule Take 50,000 Units by mouth once a week.    [provider]  Febuxostat (ULORIC) 80 MG TABS Take 80 mg by mouth daily.    [provider]  fentaNYL (DURAGESIC - DOSED MCG/HR) 12 MCG/HR Place 12 mcg onto the skin every 3 (three) days.  01/13/16   [provider]  insulin aspart protamine- aspart (NOVOLOG MIX 70/30) (70-30) 100 UNIT/ML injection Inject 20-45 Units into the skin See admin instructions. 45 units in am, 20 units in pm    [provider]  insulin aspart protamine- aspart (NOVOLOG MIX 70/30) (70-30) 100 UNIT/ML injection Inject 20-45 Units into the skin See admin instructions. 45 units in am, 20 units in pm    [provider]  loperamide (IMODIUM) 2 MG capsule Take 2 mg by mouth as needed for diarrhea or loose stools.    [provider]  metoprolol tartrate (LOPRESSOR) 25 MG tablet Take 0.5 tablets (12.5 mg total) by mouth 2 (two) times daily. Patient taking differently: Take 25 mg by mouth daily.  12/23/15   [name], MD  Netarsudil Dimesylate (RHOPRESSA) 0.02 % SOLN Place 1 drop into both eyes at bedtime.    [provider]  nitroGLYCERIN (NITRODUR - DOSED IN MG/24 HR) 0.4 mg/hr patch 0.4 mg daily.  01/21/16   [provider]  nitroGLYCERIN (NITROSTAT) 0.4 MG SL tablet Place 1 tablet (0.4 mg total) under the tongue every 5 (five) minutes as needed for chest pain (CP or SOB). 12/23/15   [name], MD  omeprazole (PRILOSEC) 40 MG capsule Take 40 mg by mouth  daily.     [provider]  potassium chloride SA (K-DUR,KLOR-CON) 20 MEQ tablet  01/23/17   [provider]  sitaGLIPtin-metformin (JANUMET) 50-500 MG tablet Take 1 tablet by mouth daily.    [provider]  torsemide (DEMADEX) 20 MG tablet Take 20 mg by mouth daily.    [provider]    Family History Family History  Problem Relation Age of Onset  . Heart disease Sister   . Heart disease Brother   . Heart disease Mother   . Heart disease Father   . Heart disease Daughter     Social History Social History   Tobacco Use  . Smoking status: Former Smoker  Last attempt to quit: 02/25/2002    Years since quitting: 15.2  . Smokeless tobacco: Never Used  Substance Use Topics  . Alcohol use: No  . Drug use: No     Allergies   Aspirin; Blue dyes (parenteral); Contrast media [iodinated diagnostic agents]; Ibuprofen; Iohexol; and Shellfish-derived products   Review of Systems Review of Systems  Constitutional: Positive for fatigue. Negative for chills and fever.  HENT: Negative for congestion, rhinorrhea and sore throat.   Eyes: Negative for visual disturbance.  Respiratory: Negative for cough, shortness of breath and wheezing.   Cardiovascular: Negative for chest pain and leg swelling.  Gastrointestinal: Positive for abdominal pain, diarrhea, nausea and vomiting.  Genitourinary: Negative for dysuria, flank pain, vaginal bleeding and vaginal discharge.  Musculoskeletal: Negative for neck pain.  Skin: Negative for rash.  Allergic/Immunologic: Negative for immunocompromised state.  Neurological: Positive for weakness. Negative for syncope and headaches.  Hematological: Does not bruise/bleed easily.  All other systems reviewed and are negative.    Physical Exam Updated Vital Signs BP (!) 151/84   Pulse 79   Temp 98.4 F (36.9 C) (Oral)   Resp 18   Ht 5\' 7"  (1.702 m)   Wt 77.1 kg (170 lb)   SpO2 98%   BMI 26.63 kg/m   Physical  Exam  Constitutional: She is oriented to person, place, and time. She appears well-developed and well-nourished. No distress.  HENT:  Head: Normocephalic and atraumatic.  Eyes: Conjunctivae are normal.  Neck: Neck supple.  Cardiovascular: Normal rate, regular rhythm and normal heart sounds. Exam reveals no friction rub.  No murmur heard. Pulmonary/Chest: [place] normal and breath sounds normal. No respiratory distress. She has no wheezes. She has no rales.  Abdominal: Soft. Normal appearance and bowel sounds are normal. She exhibits no distension. There is generalized tenderness and tenderness in the suprapubic area and left lower quadrant. There is guarding. There is no rigidity and no rebound.  Musculoskeletal: She exhibits no edema.  Neurological: She is alert and oriented to person, place, and time. She exhibits normal muscle tone.  Skin: Skin is warm. Capillary refill takes less than 2 seconds.  Psychiatric: She has a normal mood and affect.  Nursing note and vitals reviewed.    ED Treatments / Results  Labs (all labs ordered are listed, but only abnormal results are displayed) Labs Reviewed  COMPREHENSIVE METABOLIC PANEL - Abnormal; Notable for the following components:      Result Value   Glucose, Bld 111 (*)    BUN 21 (*)    Creatinine, Ser 1.11 (*)    GFR calc non Af Amer 44 (*)    GFR calc Af Amer 51 (*)    All other components within normal limits  CBC - Abnormal; Notable for the following components:   RBC 5.14 (*)    All other components within normal limits  CULTURE, BLOOD (ROUTINE X 2)  CULTURE, BLOOD (ROUTINE X 2)  LIPASE, BLOOD  URINALYSIS, ROUTINE W REFLEX MICROSCOPIC    EKG None  Radiology Ct Abdomen Pelvis Wo Contrast  Result Date: 05/30/2017 CLINICAL DATA:  Nausea vomiting diarrhea and lower abdominal pain. EXAM: CT ABDOMEN AND PELVIS WITHOUT CONTRAST TECHNIQUE: Multidetector CT imaging of the abdomen and pelvis was performed following the standard  protocol without IV contrast. COMPARISON:  Body CT 10/15/2013 FINDINGS: Lower chest: Calcific atherosclerotic disease of the coronary arteries and aorta. Mildly enlarged heart. Hepatobiliary: No focal liver abnormality is seen. Status post cholecystectomy. No biliary dilatation. Pancreas:  Unremarkable. No pancreatic ductal dilatation or surrounding inflammatory changes. Spleen: Normal in size without focal abnormality. Adrenals/Urinary Tract: Adrenal glands are unremarkable. Kidneys are normal, without renal calculi, focal lesion, or hydronephrosis. Bladder is unremarkable. Stomach/Bowel: Normal appearance of the stomach. Fluid-filled sub pathologically dilated small bowel loops throughout the abdomen. Featureless appearance of the colon. Vascular/Lymphatic: Aortic atherosclerosis. No enlarged abdominal or pelvic lymph nodes. IVC filter in place. Reproductive: Status post hysterectomy. No adnexal masses. Other: Postsurgical changes of the anterior abdominal wall with tethering of small bowel to the anterior abdominal wall. Musculoskeletal: Diffusely mottled appearance of spine which may be seen with osteopenia or widespread metastatic disease. IMPRESSION: Sub pathologically distended fluid-filled small bowels. Postsurgical tethering of small bowel loops to the anterior wall in the lower abdomen without evidence of caliber change to suggest a point of obstruction. Featureless appearance of the colon which may be seen with colitis. Diffusely mottled appearance of spine, which may be seen with osteopenia widespread osseous metastatic disease. Please correlate clinically. Electronically Signed   By: Fidela Salisbury M.D.   On: 05/30/2017 19:27    Procedures Procedures (including critical care time)  Medications Ordered in ED Medications  ciprofloxacin (CIPRO) IVPB 400 mg (has no administration in time range)  metroNIDAZOLE (FLAGYL) IVPB 500 mg (has no administration in time range)  morphine 4 MG/ML  injection 3 mg (3 mg Intravenous Given 05/30/17 1905)  ondansetron (ZOFRAN) injection 4 mg (4 mg Intravenous Given 05/30/17 1905)  0.9 %  sodium chloride infusion ( Intravenous New Bag/Given 05/30/17 1905)     Initial Impression / Assessment and Plan / ED Course  I have reviewed the triage vital signs and the nursing notes.  Pertinent labs & imaging results that were available during my care of the patient were reviewed by me and considered in my medical decision making (see chart for details).     84 year old female here with diffuse abdominal pain, nausea, and vomiting.  Lab work is overall reassuring, though she does appear mildly dehydrated.  No signs of sepsis.  She is afebrile and hemodynamically stable.  CT scan shows distended small bowel's as well as possible colitis.  Concern for acute colitis, with possible intermittent bowel obstruction.  Given that she is been unable to eat or drink and given her age, will start her on IV Cipro and Flagyl, admit to medicine for symptom medic management and hydration. Dr. Terrence Dupont to admit.  Final Clinical Impressions(s) / ED Diagnoses   Final diagnoses:  Colitis  Dehydration    ED Discharge Orders    None       Duffy Bruce, MD 05/30/17 2000

## 2017-05-30 NOTE — ED Notes (Signed)
Pt reports itching at IV site. Arm reddened and several hives noted on assessment. Cipro stopped at this time. Admitting paged.

## 2017-05-30 NOTE — H&P (Signed)
Katherine [name] is an 84 y.o. female.   Chief Complaint:  WCB:JSEGBTD is 54 year oldfemale with past medical history significant for coronary artery disease, hypertension,history of congestive heart failure secondary to preserved LV systolic function insulin-requiring diabetes mellitus,hyperlipidemia, history of CVA in the past,  chronic venous insufficiency, degenerative joint disease, history of DVT in the past status post IVC filter in the past, history of multiple abdominal surgeries and  bowel obstruction in the pastcame to the ER complaining of vague lower abdominal pain associated with nausea vomiting and diarrhea which started earlier this morning. Patient denies any fever or chills denies any urinary complaints.CT of the abdomen done in the ED showed distended small bowel loops as well as possible early colitis. Patient received IV Flagyl and Cipro and IV fluids with improvement in her symptoms. Patient denies any chest pain or shortness of breath. Denies any palpitation lightheadedness or syncope.  Past Medical History:  Diagnosis Date  . Anemia    mild  . Atrial fibrillation ([place])   . Bloody discharge from nipple   . Bowel obstruction ([place])   . CAD (coronary artery disease)   . Carotid artery stenosis   . CHF (congestive heart failure) ([place])   . Chronic kidney disease    renal insufficiency - DR Florene Glen yearly  . Diabetes mellitus without complication ([place])    type 2  . Full dentures   . History of hiatal hernia   . Hypertension   . Mobility impaired    uses walker for ambulation  . Stroke [facility])    right sided weakness,walker  . Wears glasses     Past Surgical History:  Procedure Laterality Date  . ABDOMINAL HYSTERECTOMY    . abdominal pain, RLQ    . angiodysplasia    . APPENDECTOMY    . BREAST DUCTAL SYSTEM EXCISION  03/02/2012   Procedure: EXCISION DUCTAL SYSTEM BREAST;  Surgeon: Adin Hector, MD;  Location: [facility];  Service: General;  Laterality:  Left;  . BREAST SURGERY     bx rt and lt   . CATARACT EXTRACTION     left  . CHOLECYSTECTOMY    . COLONOSCOPY N/A 10/15/2012   Procedure: COLONOSCOPY;  Surgeon: Beryle Beams, MD;  [facility];  Service: Endoscopy;  Laterality: N/A;  . constipation    . DILATION AND CURETTAGE OF UTERUS    . DVT    . ENTEROSCOPY N/A 11/17/2014   Procedure: ENTEROSCOPY;  Surgeon: Carol Ada, MD;  [facility];  Service: Endoscopy;  Laterality: N/A;  . ESOPHAGOGASTRODUODENOSCOPY N/A 09/10/2012   Procedure: ESOPHAGOGASTRODUODENOSCOPY (EGD);  Surgeon: Beryle Beams, MD;  Location[facility];  Service: Endoscopy;  Laterality: N/A;  . EYE SURGERY     right-glaucoma  . HOT HEMOSTASIS N/A 09/10/2012   Procedure: HOT HEMOSTASIS (ARGON PLASMA COAGULATION/BICAP);  Surgeon: Beryle Beams, MD;  Location: [facility];  Service: Endoscopy;  Laterality: N/A;  . HOT HEMOSTASIS N/A 11/17/2014   Procedure: HOT HEMOSTASIS (ARGON PLASMA COAGULATION/BICAP);  Surgeon: Carol Ada, MD;  Location: [facility];  Service: Endoscopy;  Laterality: N/A;  . Nausea & Vomiting    . rectal bleeding    . TUBAL LIGATION      Family History  Problem Relation Age of Onset  . Heart disease Sister   . Heart disease Brother   . Heart disease Mother   . Heart disease Father   . Heart disease Daughter    Social History:  reports that she  quit smoking about 15 years ago. She has never used smokeless tobacco. She reports that she does not drink alcohol or use drugs.  Allergies:  Allergies  Allergen Reactions  . Aspirin Hives  . Blue Dyes (Parenteral) Hives  . Contrast Media [Iodinated Diagnostic Agents] Other (See Comments)    unknown  . Ibuprofen Hives  . Iohexol Other (See Comments)    unknown  . Shellfish-Derived Products Hives     (Not in a hospital admission)  Results for orders placed or performed during the hospital encounter of 05/30/17 (from the past 48 hour(s))  Lipase, blood     Status:  None   Collection Time: 05/30/17  5:59 PM  Result Value Ref Range   Lipase 44 11 - 51 U/L    Comment: Performed at [facility], [address], [place], [place] [address]  Comprehensive metabolic panel     Status: Abnormal   Collection Time: 05/30/17  5:59 PM  Result Value Ref Range   Sodium 139 135 - 145 mmol/L   Potassium 4.7 3.5 - 5.1 mmol/L   Chloride 105 101 - 111 mmol/L   CO2 22 22 - 32 mmol/L   Glucose, Bld 111 (H) 65 - 99 mg/dL   BUN 21 (H) 6 - 20 mg/dL   Creatinine, Ser 1.11 (H) 0.44 - 1.00 mg/dL   Calcium 9.7 8.9 - 10.3 mg/dL   Total Protein 8.0 6.5 - 8.1 g/dL   Albumin 4.0 3.5 - 5.0 g/dL   AST 23 15 - 41 U/L   ALT 17 14 - 54 U/L   Alkaline Phosphatase 76 38 - 126 U/L   Total Bilirubin 0.7 0.3 - 1.2 mg/dL   GFR calc non Af Amer 44 (L) >60 mL/min   GFR calc Af Amer 51 (L) >60 mL/min    Comment: (NOTE) The eGFR has been calculated using the CKD EPI equation. This calculation has not been validated in all clinical situations. eGFR's persistently <60 mL/min signify possible Chronic Kidney Disease.    Anion gap 12 5 - 15    Comment: Performed at [facility] [address] [place], [place] [address]  CBC     Status: Abnormal   Collection Time: 05/30/17  5:59 PM  Result Value Ref Range   WBC 4.6 4.0 - 10.5 K/uL   RBC 5.14 (H) 3.87 - 5.11 MIL/uL   Hemoglobin 14.5 12.0 - 15.0 g/dL   HCT 45.3 36.0 - 46.0 %   MCV 88.1 78.0 - 100.0 fL   MCH 28.2 26.0 - 34.0 pg   MCHC 32.0 30.0 - 36.0 g/dL   RDW 14.5 11.5 - 15.5 %   Platelets 215 150 - 400 K/uL    Comment: Performed at [facility], [place] [address], [place], [place] [address]   Ct Abdomen Pelvis Wo Contrast  Result Date: 05/30/2017 CLINICAL DATA:  Nausea vomiting diarrhea and lower abdominal pain. EXAM: CT ABDOMEN AND PELVIS WITHOUT CONTRAST TECHNIQUE: Multidetector CT imaging of the abdomen and pelvis was performed following the standard protocol without IV contrast. COMPARISON:  Body CT 10/15/2013  FINDINGS: Lower chest: Calcific atherosclerotic disease of the coronary arteries and aorta. Mildly enlarged heart. Hepatobiliary: No focal liver abnormality is seen. Status post cholecystectomy. No biliary dilatation. Pancreas: Unremarkable. No pancreatic ductal dilatation or surrounding inflammatory changes. Spleen: Normal in size without focal abnormality. Adrenals/Urinary Tract: Adrenal glands are unremarkable. Kidneys are normal, without renal calculi, focal lesion, or hydronephrosis. Bladder is unremarkable. Stomach/Bowel:  Normal appearance of the stomach. Fluid-filled sub pathologically dilated small bowel loops throughout the abdomen. Featureless appearance of the colon. Vascular/Lymphatic: Aortic atherosclerosis. No enlarged abdominal or pelvic lymph nodes. IVC filter in place. Reproductive: Status post hysterectomy. No adnexal masses. Other: Postsurgical changes of the anterior abdominal wall with tethering of small bowel to the anterior abdominal wall. Musculoskeletal: Diffusely mottled appearance of spine which may be seen with osteopenia or widespread metastatic disease. IMPRESSION: Sub pathologically distended fluid-filled small bowels. Postsurgical tethering of small bowel loops to the anterior wall in the lower abdomen without evidence of caliber change to suggest a point of obstruction. Featureless appearance of the colon which may be seen with colitis. Diffusely mottled appearance of spine, which may be seen with osteopenia widespread osseous metastatic disease. Please correlate clinically. Electronically Signed   By: Fidela Salisbury M.D.   On: 05/30/2017 19:27    Review of Systems  Constitutional: Negative for chills and fever.  HENT: Negative for hearing loss.   Eyes: Negative for blurred vision.  Respiratory: Negative for cough.   Cardiovascular: Negative for chest pain, palpitations and leg swelling.  Gastrointestinal: Positive for abdominal pain, diarrhea, heartburn, nausea and  vomiting.  Genitourinary: Negative for dysuria.  Neurological: Negative for dizziness.    Blood pressure (!) 151/84, pulse 79, temperature 98.4 F (36.9 C), temperature source Oral, resp. rate 18, height 5' 7" (1.702 m), weight 77.1 kg (170 lb), SpO2 98 %. Physical Exam  Constitutional: She is oriented to person, place, and time.  HENT:  Head: Normocephalic and atraumatic.  Eyes: Conjunctivae are normal. Left eye exhibits no discharge. No scleral icterus.  Neck: Normal range of motion. Neck supple. No JVD present. No tracheal deviation present.  Cardiovascular: Normal rate and regular rhythm.  Murmur (2/6 systolic murmur noted no S3 gallop) heard. Respiratory: [place] normal and breath sounds normal. No respiratory distress. She has no wheezes. She has no rales.  GI: Soft. Bowel sounds are normal. There is tenderness. There is no rebound and no guarding.  Musculoskeletal: She exhibits no edema, tenderness or deformity.  Neurological: She is alert and oriented to person, place, and time.     Assessment/Plan Acute colitis Dehydration Coronary artery disease stable Hypertension Diabetes mellitus Diabetic neuropathy Hyperlipidemia History of CVA Degenerative joint disease Chronic venous insufficiency History of DVT and IVC filter in the past GERD Plan As per orders   [name], MD 05/30/2017, 8:55 PM

## 2017-05-30 NOTE — ED Notes (Signed)
Admitting paged to order benadryl for reaction to cipro. No return page at this time. RN upstairs made aware and  flagyl not yet started.

## 2017-05-30 NOTE — ED Notes (Signed)
Attempted to call report

## 2017-05-31 LAB — BASIC METABOLIC PANEL
ANION GAP: 10 (ref 5–15)
BUN: 21 mg/dL — ABNORMAL HIGH (ref 6–20)
CALCIUM: 9.3 mg/dL (ref 8.9–10.3)
CO2: 22 mmol/L (ref 22–32)
Chloride: 109 mmol/L (ref 101–111)
Creatinine, Ser: 1.05 mg/dL — ABNORMAL HIGH (ref 0.44–1.00)
GFR calc non Af Amer: 47 mL/min — ABNORMAL LOW (ref >60)
GFR, EST AFRICAN AMERICAN: 55 mL/min — AB (ref >60)
GLUCOSE: 114 mg/dL — AB (ref 65–99)
Potassium: 4.3 mmol/L (ref 3.5–5.1)
SODIUM: 141 mmol/L (ref 135–145)

## 2017-05-31 LAB — CBC
HCT: 42.9 % (ref 36.0–46.0)
Hemoglobin: 13.5 g/dL (ref 12.0–15.0)
MCH: 27.7 pg (ref 26.0–34.0)
MCHC: 31.5 g/dL (ref 30.0–36.0)
MCV: 88.1 fL (ref 78.0–100.0)
PLATELETS: 200 10*3/uL (ref 150–400)
RBC: 4.87 MIL/uL (ref 3.87–5.11)
RDW: 14.5 % (ref 11.5–15.5)
WBC: 3.4 10*3/uL — AB (ref 4.0–10.5)

## 2017-05-31 LAB — GLUCOSE, CAPILLARY
GLUCOSE-CAPILLARY: 100 mg/dL — AB (ref 65–99)
GLUCOSE-CAPILLARY: 97 mg/dL (ref 65–99)
Glucose-Capillary: 94 mg/dL (ref 65–99)
Glucose-Capillary: 109 mg/dL — ABNORMAL HIGH (ref 65–99)

## 2017-05-31 MED ORDER — MORPHINE SULFATE (PF) 2 MG/ML IV SOLN
2.0000 mg | Freq: Three times a day (TID) | INTRAVENOUS | Status: DC | PRN
  Administered 2017-05-31 – 2017-06-04 (×12): 2 mg via INTRAVENOUS
  Filled 2017-05-31 (×12): qty 1

## 2017-05-31 NOTE — Plan of Care (Signed)
Progressing

## 2017-05-31 NOTE — Progress Notes (Signed)
Patient received from ED via bed. Patient is alert and oriented x4. Vital signs are stable. Skin assessment  done with another nurse found intact. Iv in place and patient is on telemetry. Patient denies for pain. Patient given instruction about call bell, phone and unit routine. Bed  In low position and side rail up x2. Call bell in reach.

## 2017-05-31 NOTE — Progress Notes (Signed)
Subjective:  Patient denies any chest pain or shortness of breath states abdominal pain has improved tolerating clear liquids. Denies any fever or chills.  Objective:  Vital Signs in the last 24 hours: Temp:  [98.2 F (36.8 C)-98.4 F (36.9 C)] 98.2 F (36.8 C) (03/31 0542) Pulse Rate:  [56-80] 56 (03/31 1035) Resp:  [13-21] 20 (03/31 0542) BP: (95-154)/(57-89) 135/65 (03/31 1035) SpO2:  [95 %-98 %] 96 % (03/31 0542) Weight:  [77.1 kg (170 lb)-88 kg (194 lb 0.1 oz)] 88 kg (194 lb 0.1 oz) (03/30 2239)  Intake/Output from previous day: 03/30 0701 - 03/31 0700 In: 733.3 [P.O.:120; I.V.:313.3; IV Piggyback:300] Out: -  Intake/Output from this shift: No intake/output data recorded.  Physical Exam: Neck: no adenopathy, no carotid bruit, no JVD and supple, symmetrical, trachea midline Lungs: clear to auscultation bilaterally Heart: regular rate and rhythm, S1, S2 normal and 2/6 systolic murmur noted Abdomen: Soft bowel sounds present mild generalized tenderness noted Extremities: extremities normal, atraumatic, no cyanosis or edema  Lab Results: Recent Labs    05/30/17 1759 05/30/17 2330  WBC 4.6 3.4*  HGB 14.5 13.5  PLT 215 200   Recent Labs    05/30/17 1759 05/30/17 2330  NA 139 141  K 4.7 4.3  CL 105 109  CO2 22 22  GLUCOSE 111* 114*  BUN 21* 21*  CREATININE 1.11* 1.05*   No results for input(s): TROPONINI in the last 72 hours.  Invalid input(s): CK, MB Hepatic Function Panel Recent Labs    05/30/17 1759  PROT 8.0  ALBUMIN 4.0  AST 23  ALT 17  ALKPHOS 76  BILITOT 0.7   No results for input(s): CHOL in the last 72 hours. No results for input(s): PROTIME in the last 72 hours.  Imaging: Imaging results have been reviewed and Ct Abdomen Pelvis Wo Contrast  Result Date: 05/30/2017 CLINICAL DATA:  Nausea vomiting diarrhea and lower abdominal pain. EXAM: CT ABDOMEN AND PELVIS WITHOUT CONTRAST TECHNIQUE: Multidetector CT imaging of the abdomen and pelvis was  performed following the standard protocol without IV contrast. COMPARISON:  Body CT 10/15/2013 FINDINGS: Lower chest: Calcific atherosclerotic disease of the coronary arteries and aorta. Mildly enlarged heart. Hepatobiliary: No focal liver abnormality is seen. Status post cholecystectomy. No biliary dilatation. Pancreas: Unremarkable. No pancreatic ductal dilatation or surrounding inflammatory changes. Spleen: Normal in size without focal abnormality. Adrenals/Urinary Tract: Adrenal glands are unremarkable. Kidneys are normal, without renal calculi, focal lesion, or hydronephrosis. Bladder is unremarkable. Stomach/Bowel: Normal appearance of the stomach. Fluid-filled sub pathologically dilated small bowel loops throughout the abdomen. Featureless appearance of the colon. Vascular/Lymphatic: Aortic atherosclerosis. No enlarged abdominal or pelvic lymph nodes. IVC filter in place. Reproductive: Status post hysterectomy. No adnexal masses. Other: Postsurgical changes of the anterior abdominal wall with tethering of small bowel to the anterior abdominal wall. Musculoskeletal: Diffusely mottled appearance of spine which may be seen with osteopenia or widespread metastatic disease. IMPRESSION: Sub pathologically distended fluid-filled small bowels. Postsurgical tethering of small bowel loops to the anterior wall in the lower abdomen without evidence of caliber change to suggest a point of obstruction. Featureless appearance of the colon which may be seen with colitis. Diffusely mottled appearance of spine, which may be seen with osteopenia widespread osseous metastatic disease. Please correlate clinically. Electronically Signed   By: Fidela Salisbury M.D.   On: 05/30/2017 19:27    Cardiac Studies:  Assessment/Plan:  Resolving Acute colitis Coronary artery disease stable Hypertension Diabetes mellitus Diabetic neuropathy Hyperlipidemia History of CVA  Degenerative joint disease Chronic venous  insufficiency History of DVT and IVC filter in the past GERD Plan Continue present management    LOS: 1 day    [name] 05/31/2017, 11:31 AM

## 2017-06-01 LAB — GLUCOSE, CAPILLARY
GLUCOSE-CAPILLARY: 94 mg/dL (ref 65–99)
GLUCOSE-CAPILLARY: 88 mg/dL (ref 65–99)
Glucose-Capillary: 100 mg/dL — ABNORMAL HIGH (ref 65–99)
Glucose-Capillary: 141 mg/dL — ABNORMAL HIGH (ref 65–99)

## 2017-06-01 LAB — C DIFFICILE QUICK SCREEN W PCR REFLEX
C DIFFICILE (CDIFF) INTERP: NOT DETECTED
C DIFFICILE (CDIFF) TOXIN: NEGATIVE
C Diff antigen: NEGATIVE

## 2017-06-01 NOTE — Consult Note (Addendum)
[facility] Consult: 9:24 AM 06/02/2017  LOS: 3 days    Referring Provider: Dr Terrence Dupont  Primary Care Physician:  [name], MD Primary Gastroenterologist:  Dr. Benson Norway, will not see pt.    - Reason for Consultation:  Abdominal pain.     HPI: Katherine [name] is a 84 y.o. female.  PMH CHF.  CVA x 2 with expressive aphasia, right hemi-weakness.  IDDM.  DVT, s/p IVC filter 2009.  Venous insufficiency.  PSBO 2015.   Fatty liver per 2011 ultrasound.   Left CEA 2003.  S/p tubal ligation, hysterectomy, appendectomy, open cholecystectomy (perforated GB per pt report).    08/2012 EGD for IDA.  Dr Benson Norway.  Ablated 2 non-bleeding AVMs in D2.   10/2012 Colonoscopy.  For personal hx of colon polyps. 2 polyps, 1 and 3 mm (path: TAs with no HGD). Int and ext rrhoids.    11/2014 enteroscopy for anemia.  Dr Benson Norway.  3 non-bleeding AVMs ablated with APC.   01/2013 Ba esophagram for dysphagia.  Small [place] diverticulum.  Esophageal dysmotility. Sliding HH, no GER. 10/2013 UGI/SBFT.  For chronic abd pain and ? SBO.  Study normal.     Vague historian but has chronic "stomach" troubles. Mostly with abdominal discomfort, bloating, occasional nausea, not much emesis.  BMs formed, brown and daily vs QOD.  No blood seen.  Some dysphagia to solids >> liquids.  Takes Omeprazole 40 mg daily.     Admitted 3/30.  N/V, diarrhea, aching/throbbing right mid/lower abdominal pain began Saturday AM 3/30 . Initial BP of 95/81, pulse of 77.  BP normotensive within an hour.  No fevers.  sxs worse than normal.   CT without contrast: featureless colon, ? Colitis.  Sub pathologically distended fluid-filled small bowels. Postsurgical tethering of small bowel loops to the anterior wall in the lower abdomen without evidence of caliber change to suggest a point of  obstruction. WBCs not elevated. No growth on blood clx at 2 days.    No anemia.  CMET, lipase, U/A unremarkable. C diff, FOB negative.   Empiric cipro, flagyl initiated.  Nausea has resolved but watery, brown stool and right abdominal pain persist.    Has not had any sick contacts, does not eat out and mostly fixes her own meals.  No new meds, no antibiotics in recent weeks/months.  No associated new DOE or chest pain.  No palpitations, chest pain.   Fm Hx + for "stomach troubles" in a sister.    Past Medical History:  Diagnosis Date  . Anemia    mild  . Atrial fibrillation ([place])   . Bowel obstruction ([place]) 2015   PSBO  . CAD (coronary artery disease)   . Carotid artery stenosis   . CHF (congestive heart failure) ([place])   . Chronic kidney disease    renal insufficiency - DR Florene Glen yearly  . Diabetes mellitus without complication ([place])    type 2  . Full dentures   . History of hiatal hernia   . Hypertension   . Mobility impaired    uses  walker for ambulation  . Stroke [facility])    right sided weakness,walker  . Wears glasses     Past Surgical History:  Procedure Laterality Date  . ABDOMINAL HYSTERECTOMY    . angiodysplasia    . APPENDECTOMY    . BREAST DUCTAL SYSTEM EXCISION  03/02/2012   Procedure: EXCISION DUCTAL SYSTEM BREAST;  Surgeon: Adin Hector, MD;  Location: [facility];  Service: General;  Laterality: Left;  . BREAST SURGERY     bx rt and lt   . CATARACT EXTRACTION     left  . CHOLECYSTECTOMY    . COLONOSCOPY N/A 10/15/2012   Procedure: COLONOSCOPY;  Surgeon: Beryle Beams, MD;  Location: [facility];  Service: Endoscopy;  Laterality: N/A;  . DILATION AND CURETTAGE OF UTERUS    . DVT    . ENTEROSCOPY N/A 11/17/2014   Procedure: ENTEROSCOPY;  Surgeon: Carol Ada, MD;  Location: [facility];  Service: Endoscopy;  Laterality: N/A;  . ESOPHAGOGASTRODUODENOSCOPY N/A 09/10/2012   Procedure: ESOPHAGOGASTRODUODENOSCOPY (EGD);  Surgeon: Beryle Beams, MD;  Location: [facility];  Service: Endoscopy;  Laterality: N/A;  . EYE SURGERY     right-glaucoma  . HOT HEMOSTASIS N/A 09/10/2012   Procedure: HOT HEMOSTASIS (ARGON PLASMA COAGULATION/BICAP);  Surgeon: Beryle Beams, MD;  Location: [facility];  Service: Endoscopy;  Laterality: N/A;  . HOT HEMOSTASIS N/A 11/17/2014   Procedure: HOT HEMOSTASIS (ARGON PLASMA COAGULATION/BICAP);  Surgeon: Carol Ada, MD;  Location: [facility];  Service: Endoscopy;  Laterality: N/A;  . TUBAL LIGATION      Prior to Admission medications   Medication Sig Start Date End Date Taking? Authorizing Provider  amLODipine-valsartan (EXFORGE) 5-320 MG tablet Take 1 tablet by mouth daily.  01/14/16  Yes [provider]  atorvastatin (LIPITOR) 40 MG tablet Take 40 mg by mouth daily.   Yes [provider]  bimatoprost (LUMIGAN) 0.01 % SOLN Place 1 drop into both eyes at bedtime.   Yes [provider]  brimonidine (ALPHAGAN P) 0.1 % SOLN Place 1 drop into both eyes every 8 (eight) hours.    Yes [provider]  dorzolamide (TRUSOPT) 2 % ophthalmic solution Place 1 drop into both eyes 2 (two) times daily.   Yes [provider]  ergocalciferol (VITAMIN D2) 50000 units capsule Take 50,000 Units by mouth once a week.   Yes [provider]  Febuxostat (ULORIC) 80 MG TABS Take 80 mg by mouth daily.   Yes [provider]  insulin aspart protamine- aspart (NOVOLOG MIX 70/30) (70-30) 100 UNIT/ML injection Inject 20-45 Units into the skin See admin instructions. 45 units in am, 20 units in pm   Yes [provider]  loperamide (IMODIUM) 2 MG capsule Take 2 mg by mouth as needed for diarrhea or loose stools.   Yes [provider]  Netarsudil Dimesylate (RHOPRESSA) 0.02 % SOLN Place 1 drop into both eyes at bedtime.   Yes [provider]  nitroGLYCERIN (NITRODUR - DOSED IN MG/24 HR) 0.4 mg/hr patch 0.4 mg daily.  01/21/16  Yes [provider]  nitroGLYCERIN (NITROSTAT) 0.4 MG SL tablet Place 1 tablet (0.4 mg total) under the tongue every 5 (five) minutes as needed for chest pain (CP or SOB). 12/23/15  Yes [name], MD  omeprazole (PRILOSEC) 40 MG capsule Take 40 mg by mouth daily.    Yes [provider]  potassium chloride SA (K-DUR,KLOR-CON) 20 MEQ tablet  01/23/17  Yes [provider]  sitaGLIPtin-metformin (JANUMET) 50-500 MG tablet Take 1 tablet by mouth daily.   Yes [provider]  torsemide (DEMADEX) 20 MG tablet Take 20 mg by mouth daily.   Yes [provider]    Scheduled Meds: . atorvastatin  40 mg Oral Daily  . clopidogrel  75 mg Oral Daily  . enoxaparin (LOVENOX) injection  40 mg Subcutaneous Q24H  . insulin aspart  0-9 Units Subcutaneous TID WC  . latanoprost  1 drop Both Eyes QHS  . nitroGLYCERIN  0.2 mg Transdermal Daily  . pantoprazole  40 mg Oral Daily   Infusions: . sodium chloride 1,000 mL (06/01/17 1511)   PRN Meds: diphenhydrAMINE, morphine injection, nitroGLYCERIN, ondansetron **OR** ondansetron (ZOFRAN) IV   Allergies as of 05/30/2017 - Review Complete 05/30/2017  Allergen Reaction Noted  . Aspirin Hives 06/15/2011  . Blue dyes (parenteral) Hives 06/15/2011  . Contrast media [iodinated diagnostic agents] Other (See Comments) 02/26/2012  . Ibuprofen Hives 06/15/2011  . Iohexol Other (See Comments) 11/04/2007  . Shellfish-derived products Hives 06/15/2011    Family History  Problem Relation Age of Onset  . Heart disease Sister   . Heart disease Brother   . Heart disease Mother   . Heart disease Father   . Heart disease Daughter     Social History   Socioeconomic History  . Marital status: Widowed    Spouse name: Not on file  . Number of children: Not on file  . Years of education: Not on file  . Highest education level: Not on file  Occupational History  . Not on file  Social Needs  . Financial resource strain: Not on file    . Food insecurity:    Worry: Not on file    Inability: Not on file  . Transportation needs:    Medical: Not on file    Non-medical: Not on file  Tobacco Use  . Smoking status: Former Smoker    Last attempt to quit: 02/25/2002    Years since quitting: 15.2  . Smokeless tobacco: Never Used  Substance and Sexual Activity  . Alcohol use: No  . Drug use: No  . Sexual activity: Not Currently  Lifestyle  . Physical activity:    Days per week: Not on file    Minutes per session: Not on file  . Stress: Not on file  Relationships  . Social connections:    Talks on phone: Not on file    Gets together: Not on file    Attends religious service: Not on file    Active member of club or organization: Not on file    Attends meetings of clubs or organizations: Not on file    Relationship status: Not on file  . Intimate partner violence:    Fear of current or ex partner: Not on file    Emotionally abused: Not on file    Physically abused: Not on file    Forced sexual activity: Not on file  Other Topics Concern  . Not on file  Social History Narrative  . Not on file    REVIEW OF SYSTEMS: Constitutional: Generally unsteady on her feet but she compensates by using a walker and cane.  She does not leave the house much.  She gets very little exercise but is able to move about her home without difficulty. ENT:  No nose bleeds Pulm: Stable dyspnea on exertion.  She rarely mobilizes enough to really feel short of breath.  No cough. CV:  No palpitations,  no LE edema.  No chest pain GU:  No hematuria.  She is urinating a lot due to IV fluids. GI:  Per HPI Heme: Denies unusual bleeding or bruising. Transfusions: No recent issues with low blood counts or blood transfusions. Neuro:  No headaches, no peripheral tingling or numbness.Syncope. Derm:  No itching, no rash or sores.  Endocrine:  No sweats or chills.  No polyuria or dysuria Immunization: Patient does not take the flu shot.  She says  that she got sick from it several years ago.  Also unclear if she is ever received pneumococcal vaccination. Travel:  None beyond local counties in last few months.    PHYSICAL EXAM: Vital signs in last 24 hours: Vitals:   06/01/17 2103 06/02/17 0450  BP: 135/65 (!) 158/67  Pulse: (!) 55 (!) 52  Resp: 18 18  Temp: 98.1 F (36.7 C) 98.1 F (36.7 C)  SpO2: 100% 98%   Wt Readings from Last 3 Encounters:  05/30/17 194 lb 0.1 oz (88 kg)  01/29/17 198 lb 14.4 oz (90.2 kg)  11/27/16 184 lb (83.5 kg)    General: Elderly, overweight, alert, comfortable AAF.  Expressive aphasia Head: No facial asymmetry or swelling.  No signs of head trauma. Eyes: No scleral icterus.  No conjunctival pallor.  EOMI. Ears: Slightly hard of hearing. Nose: No discharge. Mouth: Oropharynx moist, clear.  Tongue midline.  Dentures in place. Neck: No mass, no JVD.  No thyromegaly. Lungs: Clear bilaterally.  No cough or labored breathing. Heart: RRR.  2/6 SEM.  S1, S2 present. Abdomen: Obese.  Multiple surgical scars because asymmetry to the abdominal wall.  Tender in the right mid to lower quadrant without guarding or rebound.  Bowel sounds normal but hypoactive.  do not appreciate hernias, organomegaly or bruits.   Rectal: deferred   Musc/Skeltl: No significant joint contractures or deformity.  No joint swelling. Extremities:  No CCE.    Neurologic:  Alert, oriented x 3.  Expressive aphasia.  Moves all 4 limbs, ROM and strength not tested.  No tremor Skin:  No rash, no sores Tattoos:  none Nodes:  No cervical adenopathy   Psych:  Cooperative, pleasant, calm.     Intake/Output from previous day: 04/01 0701 - 04/02 0700 In: 1148.3 [P.O.:340; I.V.:408.3; IV Piggyback:400] Out: 2 [Urine:1; Stool:1] Intake/Output this shift: Total I/O In: 120 [P.O.:120] Out: -   LAB RESULTS: Recent Labs    05/30/17 1759 05/30/17 2330  WBC 4.6 3.4*  HGB 14.5 13.5  HCT 45.3 42.9  PLT 215 200   BMET Lab Results   Component Value Date   NA 141 05/30/2017   NA 139 05/30/2017   NA 140 11/27/2016   K 4.3 05/30/2017   K 4.7 05/30/2017   K 4.2 11/27/2016   CL 109 05/30/2017   CL 105 05/30/2017   CL 108 11/27/2016   CO2 22 05/30/2017   CO2 22 05/30/2017   CO2 23 11/27/2016   GLUCOSE 114 (H) 05/30/2017   GLUCOSE 111 (H) 05/30/2017   GLUCOSE 148 (H) 11/27/2016   BUN 21 (H) 05/30/2017   BUN 21 (H) 05/30/2017   BUN 15 11/27/2016   CREATININE 1.05 (H) 05/30/2017   CREATININE 1.11 (H) 05/30/2017   CREATININE 1.00 11/27/2016   CALCIUM 9.3 05/30/2017   CALCIUM 9.7 05/30/2017   CALCIUM 9.9 11/27/2016   LFT Recent Labs    05/30/17 1759  PROT 8.0  ALBUMIN 4.0  AST 23  ALT 17  ALKPHOS 76  BILITOT  0.7   PT/INR Lab Results  Component Value Date   INR 1.04 10/19/2013   INR 1.0 11/04/2007   INR 2.5 (H) 09/24/2007   Hepatitis Panel No results for input(s): HEPBSAG, HCVAB, HEPAIGM, HEPBIGM in the last 72 hours. C-Diff No components found for: CDIFF Lipase     Component Value Date/Time   LIPASE 44 05/30/2017 1759    RADIOLOGY STUDIES: No results found.   IMPRESSION:   *   Acute right abd pain with n/v/d.  No bleeding.  Possible colitis per CT, and her BP was low at arrival but quickly normalized so ? Ischemic colitis.  ? Viral gastroenteritis.  ? Early PSBO.   Overall improved sxs though pain and watery brown stools persist.  C diff andFOBT negative.   *   S/p multiple abd/pelvic surgeries.    *  Hx CVAs with expressive aphasia and right hemi-weakness.  Note that she is now receiving Plavix but according to med list this is not a home med.  Need to be cautious with platelet disrupting or AC meds given hx of anemia possibly from bleeding gastrointestinal AVMs.    *  Hx adenomatous colon polyps in 2014.  Given her age and comorbidities, would not perform surveillance colonoscopy.   *   Hx GERD.  On PPI.       PLAN:     *   Stopped the abx.  Advance to full liquids.   *     UGI with SB Series   Azucena Freed  06/02/2017, 9:24 AM Pager: [phone]   Attending physician's note   I have taken an interval history, reviewed the chart and examined the patient. I agree with the Advanced Practitioner's note, impression and recommendations.  84 year old female with multiple comorbid conditions admitted with acute on chronic abdominal pain with associated nausea/vomiting.  She had a EGD, enteroscopy, colonoscopy previously as above she has history of multiple abdominal surgeries.  Which have been negative except for small bowel AVMs.  Noncontrast CT scan without p.o. contrast showed mildly dilated small bowel loops.  Patient with previous history of multiple abdominal surgeries.  We plan to proceed with upper GI with small bowel series to rule out small bowel obstruction.  Would discontinue Flagyl as it can cause nausea/vomiting.  Will follow along.   Discussed with Dr. Hendricks Milo, MD [phone], Mon-Fri 8a-5p [phone] after 5p, weekends, holidays

## 2017-06-01 NOTE — Progress Notes (Signed)
Subjective:  Continues to have generalized abdominal pain associated with diarrhea now. No further vomiting. Denies any fever or chills. Has seen GI in the past.  Objective:  Vital Signs in the last 24 hours: Temp:  [97.9 F (36.6 C)-98.4 F (36.9 C)] 98.2 F (36.8 C) (04/01 0612) Pulse Rate:  [54-64] 56 (04/01 0612) Resp:  [20-21] 21 (04/01 0612) BP: (126-136)/(60-96) 126/60 (04/01 0612) SpO2:  [99 %-100 %] 99 % (04/01 0612)  Intake/Output from previous day: 03/31 0701 - 04/01 0700 In: 1815.8 [P.O.:240; I.V.:1175.8; IV Piggyback:400] Out: -  Intake/Output from this shift: Total I/O In: 240 [P.O.:240] Out: -   Physical Exam: Neck: no adenopathy, no carotid bruit, no JVD and supple, symmetrical, trachea midline Lungs: clear to auscultation bilaterally Heart: regular rate and rhythm, S1, S2 normal and 2/6 systolic murmur noted Abdomen: Soft bowel sounds present and mild generalized tenderness noted no guarding or rebound tenderness Extremities: extremities normal, atraumatic, no cyanosis or edema  Lab Results: Recent Labs    05/30/17 1759 05/30/17 2330  WBC 4.6 3.4*  HGB 14.5 13.5  PLT 215 200   Recent Labs    05/30/17 1759 05/30/17 2330  NA 139 141  K 4.7 4.3  CL 105 109  CO2 22 22  GLUCOSE 111* 114*  BUN 21* 21*  CREATININE 1.11* 1.05*   No results for input(s): TROPONINI in the last 72 hours.  Invalid input(s): CK, MB Hepatic Function Panel Recent Labs    05/30/17 1759  PROT 8.0  ALBUMIN 4.0  AST 23  ALT 17  ALKPHOS 76  BILITOT 0.7   No results for input(s): CHOL in the last 72 hours. No results for input(s): PROTIME in the last 72 hours.  Imaging: Imaging results have been reviewed and Ct Abdomen Pelvis Wo Contrast  Result Date: 05/30/2017 CLINICAL DATA:  Nausea vomiting diarrhea and lower abdominal pain. EXAM: CT ABDOMEN AND PELVIS WITHOUT CONTRAST TECHNIQUE: Multidetector CT imaging of the abdomen and pelvis was performed following the  standard protocol without IV contrast. COMPARISON:  Body CT 10/15/2013 FINDINGS: Lower chest: Calcific atherosclerotic disease of the coronary arteries and aorta. Mildly enlarged heart. Hepatobiliary: No focal liver abnormality is seen. Status post cholecystectomy. No biliary dilatation. Pancreas: Unremarkable. No pancreatic ductal dilatation or surrounding inflammatory changes. Spleen: Normal in size without focal abnormality. Adrenals/Urinary Tract: Adrenal glands are unremarkable. Kidneys are normal, without renal calculi, focal lesion, or hydronephrosis. Bladder is unremarkable. Stomach/Bowel: Normal appearance of the stomach. Fluid-filled sub pathologically dilated small bowel loops throughout the abdomen. Featureless appearance of the colon. Vascular/Lymphatic: Aortic atherosclerosis. No enlarged abdominal or pelvic lymph nodes. IVC filter in place. Reproductive: Status post hysterectomy. No adnexal masses. Other: Postsurgical changes of the anterior abdominal wall with tethering of small bowel to the anterior abdominal wall. Musculoskeletal: Diffusely mottled appearance of spine which may be seen with osteopenia or widespread metastatic disease. IMPRESSION: Sub pathologically distended fluid-filled small bowels. Postsurgical tethering of small bowel loops to the anterior wall in the lower abdomen without evidence of caliber change to suggest a point of obstruction. Featureless appearance of the colon which may be seen with colitis. Diffusely mottled appearance of spine, which may be seen with osteopenia widespread osseous metastatic disease. Please correlate clinically. Electronically Signed   By: Fidela Salisbury M.D.   On: 05/30/2017 19:27    Cardiac Studies:  Assessment/Plan:  Resolving Acute colitis Coronary artery disease stable Hypertension Diabetes mellitus Diabetic neuropathy Hyperlipidemia History of CVA Degenerative joint disease Chronic venous insufficiency History  of DVT and IVC  filter in the past GERD Plan Continue clear liquids for now GI consult Check stool for C. Difficile toxin    LOS: 2 days    [name] 06/01/2017, 11:23 AM

## 2017-06-02 ENCOUNTER — Encounter (HOSPITAL_COMMUNITY): Payer: Self-pay | Admitting: Physician Assistant

## 2017-06-02 DIAGNOSIS — R112 Nausea with vomiting, unspecified: Secondary | ICD-10-CM

## 2017-06-02 DIAGNOSIS — R109 Unspecified abdominal pain: Secondary | ICD-10-CM

## 2017-06-02 DIAGNOSIS — R1031 Right lower quadrant pain: Secondary | ICD-10-CM

## 2017-06-02 DIAGNOSIS — R1011 Right upper quadrant pain: Secondary | ICD-10-CM

## 2017-06-02 DIAGNOSIS — R197 Diarrhea, unspecified: Secondary | ICD-10-CM

## 2017-06-02 LAB — GLUCOSE, CAPILLARY
GLUCOSE-CAPILLARY: 121 mg/dL — AB (ref 65–99)
GLUCOSE-CAPILLARY: 155 mg/dL — AB (ref 65–99)
GLUCOSE-CAPILLARY: 152 mg/dL — AB (ref 65–99)
GLUCOSE-CAPILLARY: 101 mg/dL — AB (ref 65–99)
Glucose-Capillary: 107 mg/dL — ABNORMAL HIGH (ref 65–99)

## 2017-06-02 NOTE — Progress Notes (Signed)
Subjective:  Appreciated.  GI evaluation and consult Denies any chest pain or shortness of breath.  States abdominal pain slightly improved.  Diarrhea persist.  No fever or chills.  Stool for C. Difficile toxin negative  Objective:  Vital Signs in the last 24 hours: Temp:  [98.1 F (36.7 C)] 98.1 F (36.7 C) (04/02 0450) Pulse Rate:  [52-55] 52 (04/02 0450) Resp:  [18] 18 (04/02 0450) BP: (135-158)/(65-67) 158/67 (04/02 0450) SpO2:  [98 %-100 %] 98 % (04/02 0450)  Intake/Output from previous day: 04/01 0701 - 04/02 0700 In: 1148.3 [P.O.:340; I.V.:408.3; IV Piggyback:400] Out: 2 [Urine:1; Stool:1] Intake/Output from this shift: Total I/O In: 120 [P.O.:120] Out: 4 [Urine:3; Stool:1]  Physical Exam: Neck: no adenopathy, no carotid bruit, no JVD and supple, symmetrical, trachea midline Lungs: clear to auscultation bilaterally Heart: bradycardic, S1, S2 normal.  There is 2/6 systolic murmur noted Abdomen: soft, bowel sounds present.  Mild generalized tenderness noted.  No guarding or rebound tenderness Extremities: extremities normal, atraumatic, no cyanosis or edema  Lab Results: Recent Labs    05/30/17 1759 05/30/17 2330  WBC 4.6 3.4*  HGB 14.5 13.5  PLT 215 200   Recent Labs    05/30/17 1759 05/30/17 2330  NA 139 141  K 4.7 4.3  CL 105 109  CO2 22 22  GLUCOSE 111* 114*  BUN 21* 21*  CREATININE 1.11* 1.05*   No results for input(s): TROPONINI in the last 72 hours.  Invalid input(s): CK, MB Hepatic Function Panel Recent Labs    05/30/17 1759  PROT 8.0  ALBUMIN 4.0  AST 23  ALT 17  ALKPHOS 76  BILITOT 0.7   No results for input(s): CHOL in the last 72 hours. No results for input(s): PROTIME in the last 72 hours.  Imaging: Imaging results have been reviewed and No results found.  Cardiac Studies:  Assessment/Plan:  Chronic abdominal pain multifactorial Coronary artery disease stable Hypertension Diabetes mellitus Diabetic  neuropathy Hyperlipidemia History of CVA Degenerative joint disease Chronic venous insufficiency History of DVT and IVC filter in the past GERD Plan Continue present management. Advance diet as tolerated as per GI   LOS: 3 days    [name] 06/02/2017, 2:12 PM

## 2017-06-03 ENCOUNTER — Encounter (HOSPITAL_COMMUNITY): Payer: Self-pay

## 2017-06-03 ENCOUNTER — Inpatient Hospital Stay (HOSPITAL_COMMUNITY): Payer: Medicare Other

## 2017-06-03 LAB — GLUCOSE, CAPILLARY
GLUCOSE-CAPILLARY: 121 mg/dL — AB (ref 65–99)
GLUCOSE-CAPILLARY: 135 mg/dL — AB (ref 65–99)
Glucose-Capillary: 117 mg/dL — ABNORMAL HIGH (ref 65–99)
Glucose-Capillary: 189 mg/dL — ABNORMAL HIGH (ref 65–99)

## 2017-06-03 MED ORDER — DICYCLOMINE HCL 10 MG PO CAPS
10.0000 mg | ORAL_CAPSULE | Freq: Three times a day (TID) | ORAL | Status: DC
  Administered 2017-06-03 – 2017-06-04 (×4): 10 mg via ORAL
  Filled 2017-06-03 (×4): qty 1

## 2017-06-03 NOTE — Progress Notes (Addendum)
Daily Rounding Note  06/03/2017, 10:00 AM  LOS: 4 days   SUBJECTIVE:   Chief complaint: still having pain in right abdomen, flaring after manipulation for UGI/SBFT.  No N/V.  Stools liquid, tan/light brown.         OBJECTIVE:         Vital signs in last 24 hours:    Temp:  [97.9 F (36.6 C)-98.4 F (36.9 C)] 97.9 F (36.6 C) (04/03 0554) Pulse Rate:  [55-66] 66 (04/03 0554) Resp:  [17-18] 17 (04/03 0554) BP: (169-187)/(77-95) 169/87 (04/03 0554) SpO2:  [100 %] 100 % (04/03 0554) Last BM Date: 06/02/17 Filed Weights   05/30/17 1737 05/30/17 2239  Weight: 170 lb (77.1 kg) 194 lb 0.1 oz (88 kg)   General: obese, chronically ill looking.     Heart: RRR Chest: clear bil. No SOB or cough.    Abdomen: soft, ND.  Active BS.  Tender in right mid.lower area without guard or rebound.    Extremities: no CCE Neuro/Psych:  Oriented x 3.  Alert.  Slightly anxious.    Intake/Output from previous day: 04/02 0701 - 04/03 0700 In: 1049.2 [P.O.:470; I.V.:579.2] Out: 354 [Urine:353; Stool:1]  Intake/Output this shift: No intake/output data recorded.  Lab Results: No results for input(s): WBC, HGB, HCT, PLT in the last 72 hours. BMET No results for input(s): NA, K, CL, CO2, GLUCOSE, BUN, CREATININE, CALCIUM in the last 72 hours.   Studies/Results: Dg Ugi W/small Bowel  Result Date: 06/03/2017 CLINICAL DATA:  Abdominal pain common nausea and vomiting. Suspected partial small bowel obstruction on recent CT scan. EXAM: UPPER GI SERIES WITH SMALL BOWEL FOLLOW-THROUGH FLUOROSCOPY TIME:  Fluoroscopy Time:  4 minutes and 36 seconds. Radiation Exposure Index (if provided by the fluoroscopic device): 125 mGy Number of Acquired Spot Images: 3 TECHNIQUE: Combined double contrast and single contrast upper GI series using effervescent crystals, thick barium, and thin barium. Subsequently, serial images of the small bowel were obtained including  spot views of the terminal ileum. COMPARISON:  CT scan 05/30/2017 FINDINGS: Initial barium swallows demonstrate nonspecific esophageal motility disorder with occasional disruption of the primary peristaltic wave and occasional tertiary contractions. Suspect a widely patent lower esophageal muscular a ring. No mass or stricture. The stomach is unremarkable. Normal appearing mucosal folds. No mass or ulcer. The duodenal bulb and C-loop appear normal. Small bowel transit time was 50 minutes. Normal appearing jejunal loops of small bowel in the upper abdomen with normal mucosal fold pattern. Normal appearing loops of ileum in the mid abdomen and right lower quadrant with normal mucosal fold pattern. No bowel distention or obstruction. Terminal ileum appears normal. No intrinsic or extrinsic mass lesions are identified. IMPRESSION: Unremarkable upper GI and small-bowel follow-through examination. No findings for small bowel obstruction. Electronically Signed   By: Marijo Sanes M.D.   On: 06/03/2017 09:36    Scheduled Meds: . atorvastatin  40 mg Oral Daily  . clopidogrel  75 mg Oral Daily  . enoxaparin (LOVENOX) injection  40 mg Subcutaneous Q24H  . insulin aspart  0-9 Units Subcutaneous TID WC  . latanoprost  1 drop Both Eyes QHS  . nitroGLYCERIN  0.2 mg Transdermal Daily  . pantoprazole  40 mg Oral Daily   Continuous Infusions: . sodium chloride 50 mL/hr at 06/02/17 1250   PRN Meds:.diphenhydrAMINE, morphine injection, nitroGLYCERIN, ondansetron **OR** ondansetron (ZOFRAN) IV   ASSESMENT:   *   Acute right abd pain with n/v/d.  No bleeding.  Possible colitis per CT, and her BP was low at arrival but quickly normalized so ? Ischemic colitis.  ? Viral gastroenteritis.  ? Early PSBO.   UGI/SBFT unremarkable, incidental note made of esophageal dysmotility.   Overall improved sxs though pain and watery brown stools persist.  C diff andFOBT negative.   *   S/p multiple abd/pelvic surgeries.  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     *  Hx CVAs with expressive aphasia and right hemi-weakness.  Note that she is now receiving Plavix but according to med list this is not a home med.    *  Hx adenomatous colon polyps in 2014.  Given her age and comorbidities, would not perform surveillance colonoscopy.   *   Hx GERD.  On PPI.        PLAN   *  Is Plavix going to be a new med going forward?.  Need to be cautious with this given hx of anemia possibly from bleeding gastrointestinal AVMs.    *   Diet dysphagia 3, carb modified.   Add Bentyl AC.      Katherine [name]  06/03/2017, 10:00 AM Phone [phone]  .  Attending physician's note   Upper GI with small bowel series negative for any small bowel obstruction.  Patient doing much better.  She is tolerating diet without any problems.  The abdominal pain is better.  The diarrhea was little worse after drinking barium but it settling down.  Plan: Advance diet, add Bentyl, physical therapy to ambulate.  If she is better, can discharge home tomorrow with GI follow-up.  No GI intervention planned for now.  I have taken an interval history, reviewed the UGI with SB Series and examined the patient. I agree with the Advanced Practitioner's note, impression and recommendations.     Carmell Austria, MD [phone], Mon-Fri 8a-5p [phone] after 5p, weekends, holidays

## 2017-06-03 NOTE — Care Management Important Message (Signed)
Important Message  Patient Details  Name: Katherine [name] MRN: [id number] Date of Birth: 1931-01-24   Medicare Important Message Given:  Yes    Ayeden Gladman Montine Circle 06/03/2017, 1:58 PM

## 2017-06-03 NOTE — Care Management Note (Signed)
Case Management Note  Patient Details  Name: Katherine [name] MRN: [id number] Date of Birth: 11/27/1930  Subjective/Objective:      Colitis              Action/Plan: Transition to home with home health services  Expected Discharge Date:                  Expected Discharge Plan:  [place]  In-House Referral:     Discharge planning Services  CM Consult  Post Acute Care Choice:    Choice offered to:  Patient  DME Arranged:    DME Agency:     HH Arranged:  PT [place]:  [place], pending MD's order. NCM has requested order from MD.  Status of Service:  In process, will continue to follow  If discussed at Long Length of Stay Meetings, dates discussed:    Additional Comments:  Sharin Mons, RN 06/03/2017, 3:13 PM

## 2017-06-03 NOTE — Evaluation (Signed)
Physical Therapy Evaluation Patient Details Name: Katherine [name] MRN: [id number] DOB: [birth year] Today's Date: 06/03/2017   History of Present Illness  Pt is an 84 y/o female admitted secondary to nausea/vomiting/diarrhea, and lower abdominal pain. CT revealed possible collitis and other imaging unremarkable upper GI and small bowel examination. PMH includes a fib, CAD, CHF, CKD, DM, HTN, CVA with R weakness, DVT.   Clinical Impression  Pt admitted secondary to problem above with deficits below. Pt presenting with fatigue, weakness (RLE>LLE at baseline), decreased balance, and cognitive deficits. Pt requiring min to min guard A for mobility with cane and only able to tolerate gait within the room. Pt reports CNA is there 7 days/week for 2-3hours. Pt wanting to go back to ILF at this time, so recommend HHPT. Will continue to follow acutely to maximize functional mobility independence and safety.     Follow Up Recommendations Home health PT;Supervision/Assistance - 24 hour(refusing SNF )    Equipment Recommendations  None recommended by PT    Recommendations for Other Services OT consult     Precautions / Restrictions Precautions Precautions: Fall Restrictions Weight Bearing Restrictions: No      Mobility  Bed Mobility               General bed mobility comments: Sitting EOB upon entry.   Transfers Overall transfer level: Needs assistance Equipment used: Straight cane Transfers: Sit to/from Stand Sit to Stand: Min assist         General transfer comment: Min A for lift assist and steadying.   Ambulation/Gait Ambulation/Gait assistance: Min assist;Min guard Ambulation Distance (Feet): 20 Feet Assistive device: Straight cane Gait Pattern/deviations: Step-through pattern;Decreased stride length;Trunk flexed Gait velocity: Decreased  Gait velocity interpretation: Below normal speed for age/gender General Gait Details: Slow, unsteady gait. Required min to min guard A  for steadying with use of cane. No RW in room, so will need to bring next session and attempt gait with RW. Distance limited to within the room secondary to fatigue.   Stairs            Wheelchair Mobility    Modified Rankin (Stroke Patients Only)       Balance Overall balance assessment: Needs assistance Sitting-balance support: No upper extremity supported;Feet supported Sitting balance-Leahy Scale: Good     Standing balance support: Single extremity supported;During functional activity Standing balance-Leahy Scale: Poor Standing balance comment: Reliant on UE support and external support.                              Pertinent Vitals/Pain Pain Assessment: No/denies pain    Home Living Family/patient expects to be discharged to:: Private residence Living Arrangements: Alone Available Help at Discharge: Personal care attendant(One comes M-F for 3 hours, and another comes sat-sun 2hrs) Type of Home: Independent living facility Home Access: Elevator     Home Layout: One level Home Equipment: Shower seat;Bedside commode;Cane - single point;Walker - 2 wheels;Wheelchair - power      Prior Function Level of Independence: Needs assistance   Gait / Transfers Assistance Needed: Uses walker or cane for ambulation. Sometimes needs help with transfers and ambulation. Uses power WC occaisionally.   ADL's / Homemaking Assistance Needed: Aide helps with bathing and dressing. Aides help with meal preparation.         Hand Dominance   Dominant Hand: Right    Extremity/Trunk Assessment   Upper Extremity Assessment Upper Extremity Assessment: Defer to  OT evaluation    Lower Extremity Assessment Lower Extremity Assessment: RLE deficits/detail RLE Deficits / Details: RLE weakness at baseline.     Cervical / Trunk Assessment Cervical / Trunk Assessment: Kyphotic  Communication   Communication: No difficulties  Cognition Arousal/Alertness:  Awake/alert Behavior During Therapy: WFL for tasks assessed/performed Overall Cognitive Status: History of cognitive impairments - at baseline                                 General Comments: Noted memory deficits, however, pt reports she has had 2 CVAs.       General Comments General comments (skin integrity, edema, etc.): Discussed current deficits and safety concerns at home, however, pt refusing SNF and wanting to go back to ILF.     Exercises     Assessment/Plan    PT Assessment Patient needs continued PT services  PT Problem List Decreased strength;Decreased balance;Decreased mobility;Decreased cognition;Decreased knowledge of use of DME;Decreased knowledge of precautions       PT Treatment Interventions DME instruction;Gait training;Functional mobility training;Therapeutic exercise;Therapeutic activities;Balance training;Neuromuscular re-education;Patient/family education    PT Goals (Current goals can be found in the Care Plan section)  Acute Rehab PT Goals Patient Stated Goal: to go back to ILF  PT Goal Formulation: With patient Time For Goal Achievement: 06/17/17 Potential to Achieve Goals: Good    Frequency Min 3X/week   Barriers to discharge Decreased caregiver support      Co-evaluation               AM-PAC PT "6 Clicks" Daily Activity  Outcome Measure Difficulty turning over in bed (including adjusting bedclothes, sheets and blankets)?: A Little Difficulty moving from lying on back to sitting on the side of the bed? : Unable Difficulty sitting down on and standing up from a chair with arms (e.g., wheelchair, bedside commode, etc,.)?: Unable Help needed moving to and from a bed to chair (including a wheelchair)?: A Little Help needed walking in hospital room?: A Little Help needed climbing 3-5 steps with a railing? : A Lot 6 Click Score: 13    End of Session Equipment Utilized During Treatment: Gait belt Activity Tolerance: Patient  limited by fatigue Patient left: in chair;with call bell/phone within reach Nurse Communication: Mobility status PT Visit Diagnosis: Unsteadiness on feet (R26.81);Muscle weakness (generalized) (M62.81)    Time: 6759-1638 PT Time Calculation (min) (ACUTE ONLY): 28 min   Charges:   PT Evaluation $PT Eval Low Complexity: 1 Low PT Treatments $Therapeutic Activity: 8-22 mins   PT G Codes:        Leighton Ruff, PT, DPT  Acute Rehabilitation Services  Pager: [phone]   Rudean Hitt 06/03/2017, 1:24 PM

## 2017-06-03 NOTE — Progress Notes (Signed)
Subjective:  Up in chair, tolerating clear liquids continues to have chronic abdominal pain.  Denies any fever or chills.  Objective:  Vital Signs in the last 24 hours: Temp:  [97.9 F (36.6 C)-98.4 F (36.9 C)] 97.9 F (36.6 C) (04/03 0554) Pulse Rate:  [55-66] 66 (04/03 0554) Resp:  [17-18] 17 (04/03 0554) BP: (169-187)/(77-95) 169/87 (04/03 0554) SpO2:  [100 %] 100 % (04/03 0554)  Intake/Output from previous day: 04/02 0701 - 04/03 0700 In: 1049.2 [P.O.:470; I.V.:579.2] Out: 354 [Urine:353; Stool:1] Intake/Output from this shift: No intake/output data recorded.  Physical Exam: Neck: no adenopathy, no carotid bruit, no JVD and supple, symmetrical, trachea midline Lungs: clear to auscultation bilaterally Heart: regular rate and rhythm, S1, S2 normal and soft systolic murmur noted Abdomen: soft, bowel sounds present.  Mild lower abdominal tenderness Extremities: extremities normal, atraumatic, no cyanosis or edema  Lab Results: No results for input(s): WBC, HGB, PLT in the last 72 hours. No results for input(s): NA, K, CL, CO2, GLUCOSE, BUN, CREATININE in the last 72 hours. No results for input(s): TROPONINI in the last 72 hours.  Invalid input(s): CK, MB Hepatic Function Panel No results for input(s): PROT, ALBUMIN, AST, ALT, ALKPHOS, BILITOT, BILIDIR, IBILI in the last 72 hours. No results for input(s): CHOL in the last 72 hours. No results for input(s): PROTIME in the last 72 hours.  Imaging: Imaging results have been reviewed and Dg Ugi W/small Bowel  Result Date: 06/03/2017 CLINICAL DATA:  Abdominal pain common nausea and vomiting. Suspected partial small bowel obstruction on recent CT scan. EXAM: UPPER GI SERIES WITH SMALL BOWEL FOLLOW-THROUGH FLUOROSCOPY TIME:  Fluoroscopy Time:  4 minutes and 36 seconds. Radiation Exposure Index (if provided by the fluoroscopic device): 125 mGy Number of Acquired Spot Images: 3 TECHNIQUE: Combined double contrast and single contrast  upper GI series using effervescent crystals, thick barium, and thin barium. Subsequently, serial images of the small bowel were obtained including spot views of the terminal ileum. COMPARISON:  CT scan 05/30/2017 FINDINGS: Initial barium swallows demonstrate nonspecific esophageal motility disorder with occasional disruption of the primary peristaltic wave and occasional tertiary contractions. Suspect a widely patent lower esophageal muscular a ring. No mass or stricture. The stomach is unremarkable. Normal appearing mucosal folds. No mass or ulcer. The duodenal bulb and C-loop appear normal. Small bowel transit time was 50 minutes. Normal appearing jejunal loops of small bowel in the upper abdomen with normal mucosal fold pattern. Normal appearing loops of ileum in the mid abdomen and right lower quadrant with normal mucosal fold pattern. No bowel distention or obstruction. Terminal ileum appears normal. No intrinsic or extrinsic mass lesions are identified. IMPRESSION: Unremarkable upper GI and small-bowel follow-through examination. No findings for small bowel obstruction. Electronically Signed   By: Marijo Sanes M.D.   On: 06/03/2017 09:36    Cardiac Studies:  Assessment/Plan:  Chronic abdominal pain multifactorial Coronary artery disease stable Hypertension Diabetes mellitus Diabetic neuropathy Hyperlipidemia History of CVA Degenerative joint disease Chronic venous insufficiency History of DVT and IVC filter in the past GERD Plan Continue present management. Reviewed office records.  Patient has been on Plavix chronically after her stroke   LOS: 4 days    [name] 06/03/2017, 2:14 PM

## 2017-06-04 DIAGNOSIS — R1084 Generalized abdominal pain: Secondary | ICD-10-CM

## 2017-06-04 LAB — CULTURE, BLOOD (ROUTINE X 2)
CULTURE: NO GROWTH
Culture: NO GROWTH
Special Requests: ADEQUATE
Special Requests: ADEQUATE

## 2017-06-04 LAB — GLUCOSE, CAPILLARY
Glucose-Capillary: 135 mg/dL — ABNORMAL HIGH (ref 65–99)
Glucose-Capillary: 192 mg/dL — ABNORMAL HIGH (ref 65–99)

## 2017-06-04 MED ORDER — CLOPIDOGREL BISULFATE 75 MG PO TABS: 75.0000 mg | ORAL_TABLET | Freq: Every day | ORAL | 3 refills | Status: AC

## 2017-06-04 MED ORDER — DICYCLOMINE HCL 10 MG PO CAPS: 10.0000 mg | ORAL_CAPSULE | Freq: Three times a day (TID) | ORAL | 1 refills | Status: DC

## 2017-06-04 NOTE — Progress Notes (Signed)
    Progress Note   Subjective  Patient does feel somewhat better.  She has chronic abdominal pain.  She had diarrhea which was expected after the small bowel series.  She has been eating well.  There is been no further nausea or vomiting.   Objective   Vital signs in last 24 hours: Temp:  [98 F (36.7 C)-98.5 F (36.9 C)] 98.5 F (36.9 C) (04/04 0420) Pulse Rate:  [55-67] 55 (04/04 0420) Resp:  [17-22] 17 (04/04 0420) BP: (156-177)/(69-83) 156/69 (04/04 0420) SpO2:  [98 %-100 %] 98 % (04/04 0420) Last BM Date: 06/03/17 General:    white female in NAD Heart:  Regular rate and rhythm; no murmurs Lungs: Respirations even and unlabored, lungs CTA bilaterally Abdomen:  Soft, nontender and nondistended. Normal bowel sounds.  Multiple well-healed surgical scars.  Patient with chronic abdominal tenderness. Extremities:  Without edema. Neurologic:  Alert and oriented,  grossly normal neurologically. Psych:  Cooperative. Normal mood and affect.  Studies/Results: UGI with FU: negative for any small bowel obstruction.  Did show some esophageal dysmotility.     Assessment / Plan:    Chronic abdominal pain: Negative EGD 08/2012 by Dr. Benson Norway except for 2 non-bleeding small AVMs, negative colonoscopy 10/2012 except for small tubular adenomas, negative enteroscopy 11/2014 except for small nonbleeding AVMs ablated with APC.. Negative CT scans but without contrast.  Negative upper GI with small bowel series during this admission.  Patient with multiple abdominal surgeries and expected to have intra-abdominal adhesions.    History of iron deficiency anemia-likely due to small bowel AVMs. Resolved off anticoagulation.   Plan: 1.  Can discharge home today if OK with cardiology. 2.  Follow-up in the GI clinic as an outpatient in 3 to 4 weeks. 3.  Can continue Bentyl for now. 4.  Will defer Plavix to cardiology. 5.  Will sign off for now.  Please call if with any questions.   Jackquline Denmark   06/04/2017, 10:19 AM

## 2017-06-04 NOTE — Discharge Summary (Signed)
Discharge summary dictated on 06/04/2017, dictation number is [phone]

## 2017-06-04 NOTE — Discharge Summary (Signed)
NAMESOPHIRA, Katherine [name]                 ACCOUNT NO.:  [id number]  MEDICAL RECORD NO.:  [id number]  LOCATION:  [id number]                        FACILITY:  [facility]  PHYSICIAN:  Allegra Lai. Terrence Dupont, M.D. DATE OF BIRTH:  November 23, 1930  DATE OF ADMISSION:  05/30/2017 DATE OF DISCHARGE:  06/04/2017                              DISCHARGE SUMMARY   ADMITTING DIAGNOSES: 1. Acute colitis. 2. Dehydration. 3. Coronary artery disease, stable. 4. Hypertension. 5. Diabetes mellitus. 6. Diabetic neuropathy. 7. Hyperlipidemia. 8. History of cerebrovascular accident. 9. Degenerative joint disease. 10.Chronic venous insufficiency. 11.History of deep venous thrombosis and inferior vena cava filter in     the past. 12.Gastroesophageal reflux disease.  DISCHARGE DIAGNOSES: 1. Status post acute colitis, chronic abdominal pain, status post     dehydration. 2. Coronary artery disease, stable. 3. Hypertension. 4. Hyperlipidemia. 5. Diabetes mellitus. 6. Diabetic neuropathy. 7. History of cerebrovascular accident. 8. Degenerative joint disease. 9. Chronic venous insufficiency. 10.History of deep venous thrombosis and inferior vena cava filter in     the past. 11.Gastroesophageal reflux disease. 12.History of arteriovenous malformation bleed in the past.  DISCHARGE HOME MEDICATIONS: 1. Bentyl 10 mg three times daily. 2. Continue, otherwise home medications i.e., amlodipine/valsartan     5/320 mg one daily. 3. Atorvastatin 40 mg daily. 4. Lumigan eye drops as before. 5. Alphagan eye drops as before. 6. Clopidogrel 75 mg one tablet daily. 7. Trusopt eye drops as before. 8. Vitamin D2 50,000 units every week. 9. NovoLog Mix 70/30, 45 units in a.m. and 20 units in p.m. as before. 10.Imodium 2 tablets as needed for diarrhea. 11.Nitrostat sublingual p.r.n. 12.Nitro-Dur patch 0.4 mg/h daily. 13.Omeprazole 40 mg daily. 14.Potassium chloride 20 mEq daily. 15.Janumet 50/500 mg one tablet daily. 16.Torsemide 20  mg one tablet daily.  DIET:  Low-salt, low-cholesterol 1800 calories ADA diet.  FOLLOWUP:  Follow up with me in 1 week.  Follow up with GI in 3-4 weeks.  CONDITION AT DISCHARGE:  Stable.  BRIEF HISTORY AND HOSPITAL COURSE:  Ms. Katherine [name] is an 84 year old female with past medical history significant for coronary artery disease, hypertension, history of congestive heart failure secondary to preserved LV systolic function, insulin-requiring diabetes mellitus, hyperlipidemia, history of CVA in the past, chronic venous insufficiency, degenerative joint disease, history of DVT in the past, status post IVC filter in the past, history of multiple abdominal surgeries and bowel obstruction in the past.  She came to the ER complaining of vague lower abdominal pain associated with nausea, vomiting, and diarrhea, which started earlier this morning.  The patient denies any fever or chills.  Denies any urinary complaints.  CT of the abdomen done in the ED showed distended small bowel loops as well as possible early colitis.  The patient received IV Flagyl and Cipro and IV fluids with improvement in her symptoms.  The patient denies any chest pain or shortness of breath.  Denies any palpitation, lightheadedness, or syncope.  PHYSICAL EXAMINATION:  GENERAL:  She was alert, awake, oriented x3. VITAL SIGNS:  Blood pressure was 151/84, pulse 79, and she was afebrile. EYES:  Conjunctivae were pink. NECK:  Supple.  No JVD.  No bruit. LUNGS:  Clear to auscultation  without rhonchi or rales. CARDIOVASCULAR:  S1, S2 were normal.  There was 2/6 systolic murmur.  No S3 gallop. ABDOMEN:  Soft.  Bowel sounds were normal.  There was generalized tenderness.  There was no guarding or rebound. EXTREMITIES:  There is no clubbing, cyanosis, or edema. NEURO:  Grossly intact.  LABORATORY DATA:  Sodium was 139, potassium 4.7, BUN 21, and creatinine 1.11.  Hemoglobin was 14.5, hematocrit 45.3, and white count of  4.6. Blood sugar was 111.  Stool for C. diff toxin is negative.  The patient underwent upper GI series with small-bowel follow-through, which was unremarkable.  The patient also underwent upper endoscopy, which was unremarkable.  BRIEF HOSPITAL COURSE:  The patient was admitted to telemetry unit, was started on IV fluids and clear liquid diet and continued on IV Cipro and Flagyl.  The patient developed erythematous rash with Cipro, which was switched to Rocephin.  The patient continued to have lower chronic abdominal pain associated with diarrhea.  C. diff toxins were negative. GI consultation was obtained.  The patient underwent subsequently upper GI series and small bowel follow-through as above and upper endoscopy, which was unrevealing.  The patient's abdominal pain gradually improved and diet was advanced.  The patient remained afebrile during the hospital stay.  The patient is off all antibiotics and ambulating in the room without any problems.  The patient had multiple abdominal surgeries in the past and had colonoscopy in the past, which was unrevealing and was felt not the candidate to do repeat aggressive colonoscopy as she had improvement in her symptoms.  The patient will be followed up in my office in 1 week and GI in 3-4 weeks. The patient is advised to go to ER if she had worsening abdominal pain associated with nausea, vomiting, or further significant diarrhea.     Allegra Lai. Terrence Dupont, M.D.     MNH/MEDQ  D:  06/04/2017  T:  06/04/2017  Job:  [id number]  cc:   Allegra Lai. Terrence Dupont, M.D.

## 2017-06-04 NOTE — Progress Notes (Signed)
Physical Therapy Treatment Patient Details Name: Katherine [name] MRN: [id number] DOB: 11-23-1930 Today's Date: 06/04/2017    History of Present Illness Pt is an 84 y/o female admitted secondary to nausea/vomiting/diarrhea, and lower abdominal pain. CT revealed possible collitis and other imaging unremarkable upper GI and small bowel examination. PMH includes a fib, CAD, CHF, CKD, DM, HTN, CVA with R weakness, DVT.     PT Comments    Pt with improved stability with gait using RW. Pt reports she has a standard walker at home, however it is in poor condition and does not have wheels. Updated equipt recs to include RW to improve pt safety on return home. Pt reports she is discharging this afternoon. She would benefit from continued skilled PT after d/c to improve functional independence and safety with mobility.    Follow Up Recommendations  Supervision/Assistance - 24 hour(refusing SNF )     Equipment Recommendations  Rolling walker with 5" wheels    Recommendations for Other Services OT consult     Precautions / Restrictions Precautions Precautions: Fall Restrictions Weight Bearing Restrictions: No    Mobility  Bed Mobility               General bed mobility comments: pt recieved sitting EOB  Transfers Overall transfer level: Needs assistance Equipment used: Rolling walker (2 wheeled) Transfers: Sit to/from Stand Sit to Stand: Min guard         General transfer comment: cues for technique with RW. Min guard for safety  Ambulation/Gait Ambulation/Gait assistance: Min guard Ambulation Distance (Feet): 50 Feet Assistive device: Rolling walker (2 wheeled) Gait Pattern/deviations: Step-through pattern;Decreased stride length;Trunk flexed Gait velocity: Decreased  Gait velocity interpretation: Below normal speed for age/gender General Gait Details: Pt with improved gait stability with using RW. Min guard for safety but no physical assist required. Cues for RW  proximity, postural control, and technique with turns.    Stairs            Wheelchair Mobility    Modified Rankin (Stroke Patients Only)       Balance Overall balance assessment: Needs assistance Sitting-balance support: No upper extremity supported;Feet supported Sitting balance-Leahy Scale: Good     Standing balance support: Single extremity supported;During functional activity Standing balance-Leahy Scale: Poor Standing balance comment: Able to stand EOB to don underwear.                            Cognition Arousal/Alertness: Awake/alert Behavior During Therapy: WFL for tasks assessed/performed Overall Cognitive Status: History of cognitive impairments - at baseline                                 General Comments: Noted memory deficits, however, pt reports she has had 2 CVAs.       Exercises      General Comments General comments (skin integrity, edema, etc.): Pt very agreeable to using RW at home      Pertinent Vitals/Pain Pain Assessment: No/denies pain Faces Pain Scale: Hurts little more Pain Location: R side of abdomen Pain Descriptors / Indicators: Sore Pain Intervention(s): Monitored during session    Home Living Family/patient expects to be discharged to:: Private residence Living Arrangements: Alone Available Help at Discharge: Personal care attendant(daily 3-5 hours) Type of Home: Independent living facility Home Access: Elevator   Home Layout: One level Home Equipment: Shower seat;Bedside commode;Cane - single  point;Walker - 2 wheels;Wheelchair - power      Prior Function Level of Independence: Needs assistance  Gait / Transfers Assistance Needed: uses cane in her apartment and walker in the halls, occasional use of w/c ADL's / Homemaking Assistance Needed: aide helps with showering, dressing and does all IADL     PT Goals (current goals can now be found in the care plan section) Acute Rehab PT Goals Patient  Stated Goal: to go back to ILF  PT Goal Formulation: With patient Time For Goal Achievement: 06/17/17 Potential to Achieve Goals: Good Progress towards PT goals: Progressing toward goals    Frequency    Min 3X/week      PT Plan Equipment recommendations need to be updated    Co-evaluation              AM-PAC PT "6 Clicks" Daily Activity  Outcome Measure  Difficulty turning over in bed (including adjusting bedclothes, sheets and blankets)?: A Little Difficulty moving from lying on back to sitting on the side of the bed? : Unable Difficulty sitting down on and standing up from a chair with arms (e.g., wheelchair, bedside commode, etc,.)?: Unable Help needed moving to and from a bed to chair (including a wheelchair)?: A Little Help needed walking in hospital room?: A Little Help needed climbing 3-5 steps with a railing? : A Lot 6 Click Score: 13    End of Session Equipment Utilized During Treatment: Gait belt Activity Tolerance: Patient tolerated treatment well Patient left: with call bell/phone within reach;in bed(sitting  EOB) Nurse Communication: Mobility status PT Visit Diagnosis: Unsteadiness on feet (R26.81);Muscle weakness (generalized) (M62.81)     Time: 6269-4854 PT Time Calculation (min) (ACUTE ONLY): 15 min  Charges:  $Gait Training: 8-22 mins                    G Codes:       Benjiman Core, [place] Pager [phone] Acute Rehab   Allena Katz 06/04/2017, 2:51 PM

## 2017-06-04 NOTE — Progress Notes (Signed)
Pt given discharge instructions, prescriptions, and care notes. Pt verbalized understanding AEB no further questions or concerns at this time. IV was discontinued, no redness, pain, or swelling noted at this time. Pt left the floor via wheelchair with staff in stable condition. 

## 2017-06-04 NOTE — Progress Notes (Addendum)
Text page request o dr Terrence Dupont for [facility] PT order. He called back and stated he would place order. [facility] will follow. PT requesting RW, order placed, it will be delivered by [facility] prior to DC.

## 2017-06-04 NOTE — Discharge Instructions (Signed)

## 2017-06-04 NOTE — Evaluation (Signed)
Occupational Therapy Evaluation Patient Details Name: Katherine [name] MRN: [id number] DOB: 04-15-1930 Today's Date: 06/04/2017    History of Present Illness Pt is an 84 y/o female admitted secondary to nausea/vomiting/diarrhea, and lower abdominal pain. CT revealed possible collitis and other imaging unremarkable upper GI and small bowel examination. PMH includes a fib, CAD, CHF, CKD, DM, HTN, CVA with R weakness, DVT.    Clinical Impression   Pt uses a cane in her home and RW in the halls of her ILF. She has aides which come daily and assist with ADL and IADL. Pt presents with impaired balance with sit to stand and ambulation with cane. Recommended pt use her RW when she returns home. Will follow acutely.    Follow Up Recommendations  No OT follow up    Equipment Recommendations  None recommended by OT    Recommendations for Other Services       Precautions / Restrictions Precautions Precautions: Fall Restrictions Weight Bearing Restrictions: No      Mobility Bed Mobility               General bed mobility comments: pt received in chair  Transfers Overall transfer level: Needs assistance Equipment used: Straight cane Transfers: Sit to/from Stand Sit to Stand: Min assist;Min guard         General transfer comment: increased time, steadying assist from recliner, min guard from [facility]    Balance Overall balance assessment: Needs assistance   Sitting balance-Leahy Scale: Good     Standing balance support: Single extremity supported;During functional activity Standing balance-Leahy Scale: Poor Standing balance comment: Reliant on cane and external support                           ADL either performed or assessed with clinical judgement   ADL Overall ADL's : Needs assistance/impaired Eating/Feeding: Independent;Sitting   Grooming: Wash/dry hands;Standing;Min guard   Upper Body Bathing: Minimal assistance;Sitting   Lower Body Bathing: Maximal  assistance;Sit to/from stand   Upper Body Dressing : Set up;Sitting   Lower Body Dressing: Maximal assistance;Sit to/from stand   Toilet Transfer: Minimal assistance;Ambulation;BSC(with cane)   Toileting- Clothing Manipulation and Hygiene: Min guard;Sit to/from stand       Functional mobility during ADLs: Minimal assistance;Cane General ADL Comments: pt likely safer with RW     Vision Baseline Vision/History: Glaucoma(R eye) Patient Visual Report: No change from baseline       Perception     Praxis      Pertinent Vitals/Pain Pain Assessment: Faces Faces Pain Scale: Hurts little more Pain Location: R side of abdomen Pain Descriptors / Indicators: Sore Pain Intervention(s): Monitored during session     Hand Dominance Right   Extremity/Trunk Assessment Upper Extremity Assessment Upper Extremity Assessment: Overall WFL for tasks assessed   Lower Extremity Assessment Lower Extremity Assessment: Defer to PT evaluation   Cervical / Trunk Assessment Cervical / Trunk Assessment: Kyphotic   Communication Communication Communication: No difficulties   Cognition Arousal/Alertness: Awake/alert Behavior During Therapy: WFL for tasks assessed/performed Overall Cognitive Status: History of cognitive impairments - at baseline                                 General Comments: Noted memory deficits, however, pt reports she has had 2 CVAs.    General Comments       Exercises     Shoulder Instructions  Home Living Family/patient expects to be discharged to:: Private residence Living Arrangements: Alone Available Help at Discharge: Personal care attendant(daily 3-5 hours) Type of Home: Independent living facility Home Access: [place]: One level     Bathroom Shower/Tub: Occupational psychologist: [place]: Shower seat;Bedside commode;Cane - single point;Walker - 2 wheels;Wheelchair - power           Prior Functioning/Environment Level of Independence: Needs assistance  Gait / Transfers Assistance Needed: uses cane in her apartment and walker in the halls, occasional use of w/c ADL's / Homemaking Assistance Needed: aide helps with showering, dressing and does all IADL            OT Problem List: Impaired balance (sitting and/or standing);Decreased knowledge of use of DME or AE      OT Treatment/Interventions: Self-care/ADL training    OT Goals(Current goals can be found in the care plan section) Acute Rehab OT Goals Patient Stated Goal: to go back to ILF  OT Goal Formulation: With patient Time For Goal Achievement: 06/18/17 Potential to Achieve Goals: Good ADL Goals Pt Will Perform Grooming: with modified independence;standing Pt Will Perform Toileting - Clothing Manipulation and hygiene: with modified independence;sit to/from stand Additional ADL Goal #1: Pt will gather items around her room for ADL with RW modified independently  OT Frequency: Min 2X/week   Barriers to D/C:            Co-evaluation              AM-PAC PT "6 Clicks" Daily Activity     Outcome Measure Help from another person eating meals?: None Help from another person taking care of personal grooming?: A Little Help from another person toileting, which includes using toliet, bedpan, or urinal?: A Little Help from another person bathing (including washing, rinsing, drying)?: A Lot Help from another person to put on and taking off regular upper body clothing?: None Help from another person to put on and taking off regular lower body clothing?: A Lot 6 Click Score: 18   End of Session Equipment Utilized During Treatment: Gait belt(cane)  Activity Tolerance: Patient tolerated treatment well Patient left: in chair;with call bell/phone within reach;with chair alarm set  OT Visit Diagnosis: Unsteadiness on feet (R26.81);Other abnormalities of gait and mobility (R26.89)                Time:  6378-5885 OT Time Calculation (min): 17 min Charges:  OT General Charges $OT Visit: 1 Visit OT Evaluation $OT Eval Moderate Complexity: 1 Mod G-Codes:     2017-06-22 Nestor Lewandowsky, OTR/L Pager: [phone]  Werner Lean, Haze Boyden 06-22-2017, 12:36 PM

## 2017-07-14 ENCOUNTER — Other Ambulatory Visit (INDEPENDENT_AMBULATORY_CARE_PROVIDER_SITE_OTHER): Payer: Medicare Other

## 2017-07-14 ENCOUNTER — Ambulatory Visit (INDEPENDENT_AMBULATORY_CARE_PROVIDER_SITE_OTHER): Payer: Medicare Other | Admitting: Gastroenterology

## 2017-07-14 ENCOUNTER — Encounter: Payer: Self-pay | Admitting: Gastroenterology

## 2017-07-14 VITALS — BP 132/80 | HR 62 | Ht 67.0 in | Wt 200.0 lb

## 2017-07-14 DIAGNOSIS — R109 Unspecified abdominal pain: Secondary | ICD-10-CM | POA: Diagnosis not present

## 2017-07-14 DIAGNOSIS — Z862 Personal history of diseases of the blood and blood-forming organs and certain disorders involving the immune mechanism: Secondary | ICD-10-CM | POA: Diagnosis not present

## 2017-07-14 DIAGNOSIS — G8929 Other chronic pain: Secondary | ICD-10-CM

## 2017-07-14 NOTE — Progress Notes (Signed)
Chief Complaint: FU  Referring Provider:  [name], MD      ASSESSMENT AND PLAN;   #1. Chronic abdominal pain: Negative EGD 08/2012 by Dr. Benson Norway except for 2 non-bleeding small AVMs, negative colonoscopy 10/2012 except for small tubular adenomas, negative enteroscopy 11/2014 except for small nonbleeding AVMs ablated with APC. Negative CT scans but without contrast.  Negative upper GI with small bowel series during this admission. Patient with multiple abdominal surgeries and expected to have intra-abdominal adhesions.    #2. History of iron deficiency anemia-likely due to small bowel AVMs. Resolved off anticoagulation. But restarted plavix due to H/O CVA.  Plan: - Continue omeprazole 40mg  po qd. - Check CBC today.  - Recommend to monitor CBC periodically. - Patient will call us if still with problems. All the contact numbers were given.  - FU in 12 weeks.    HPI:    Katherine [name] is a 84 y.o. female  Follow-up from her hospital visit. Has been started on fentanyl patch by pain clinic. Continues to be on Plavix Denies having any nausea, vomiting, heartburn, regurgitation, odynophagia or dysphagia.  She denies having any melena or hematochezia.  Feels significantly better.   Past Medical History:  Diagnosis Date  . Anemia    mild  . Atrial fibrillation ([place])   . Bowel obstruction ([place]) 2015   PSBO  . CAD (coronary artery disease)   . Carotid artery stenosis   . CHF (congestive heart failure) ([place])   . Chronic kidney disease    renal insufficiency - DR Florene Glen yearly  . Diabetes mellitus without complication ([place])    type 2  . Full dentures   . History of hiatal hernia   . Hypertension   . Mobility impaired    uses walker for ambulation  . Stroke [facility])    right sided weakness,walker  . Wears glasses     Past Surgical History:  Procedure Laterality Date  . ABDOMINAL HYSTERECTOMY    . angiodysplasia    . APPENDECTOMY    . BREAST DUCTAL SYSTEM EXCISION   03/02/2012   Procedure: EXCISION DUCTAL SYSTEM BREAST;  Surgeon: Adin Hector, MD;  Location: [facility];  Service: General;  Laterality: Left;  . BREAST SURGERY     bx rt and lt   . CATARACT EXTRACTION     left  . CHOLECYSTECTOMY    . COLONOSCOPY N/A 10/15/2012   Procedure: COLONOSCOPY;  Surgeon: Beryle Beams, MD;  Location: [facility];  Service: Endoscopy;  Laterality: N/A;  . DILATION AND CURETTAGE OF UTERUS    . DVT    . ENTEROSCOPY N/A 11/17/2014   Procedure: ENTEROSCOPY;  Surgeon: Carol Ada, MD;  [facility];  Service: Endoscopy;  Laterality: N/A;  . ESOPHAGOGASTRODUODENOSCOPY N/A 09/10/2012   Procedure: ESOPHAGOGASTRODUODENOSCOPY (EGD);  Surgeon: Beryle Beams, MD;  Location: [facility];  Service: Endoscopy;  Laterality: N/A;  . EYE SURGERY     right-glaucoma  . HOT HEMOSTASIS N/A 09/10/2012   Procedure: HOT HEMOSTASIS (ARGON PLASMA COAGULATION/BICAP);  Surgeon: Beryle Beams, MD;  Location: [facility];  Service: Endoscopy;  Laterality: N/A;  . HOT HEMOSTASIS N/A 11/17/2014   Procedure: HOT HEMOSTASIS (ARGON PLASMA COAGULATION/BICAP);  Surgeon: Carol Ada, MD;  Location: [facility];  Service: Endoscopy;  Laterality: N/A;  . TUBAL LIGATION      Family History  Problem Relation Age of Onset  . Heart disease Sister   . Heart disease Brother   .  Heart disease Mother   . Heart disease Father   . Heart disease Daughter     Social History   Tobacco Use  . Smoking status: Former Smoker    Last attempt to quit: 02/25/2002    Years since quitting: 15.3  . Smokeless tobacco: Never Used  Substance Use Topics  . Alcohol use: No  . Drug use: No    Current Outpatient Medications  Medication Sig Dispense Refill  . amLODipine-valsartan (EXFORGE) 5-320 MG tablet Take 1 tablet by mouth daily.     Marland Kitchen atorvastatin (LIPITOR) 40 MG tablet Take 40 mg by mouth daily.    . bimatoprost (LUMIGAN) 0.01 % SOLN Place 1 drop into both eyes at bedtime.     . brimonidine (ALPHAGAN P) 0.1 % SOLN Place 1 drop into both eyes every 8 (eight) hours.     . clopidogrel (PLAVIX) 75 MG tablet Take 1 tablet (75 mg total) by mouth daily. 30 tablet 3  . dicyclomine (BENTYL) 10 MG capsule Take 1 capsule (10 mg total) by mouth 3 (three) times daily before meals. 60 capsule 1  . dorzolamide (TRUSOPT) 2 % ophthalmic solution Place 1 drop into both eyes 2 (two) times daily.    . ergocalciferol (VITAMIN D2) 50000 units capsule Take 50,000 Units by mouth once a week.    . fentaNYL (DURAGESIC - DOSED MCG/HR) 12 MCG/HR Place 12.5 mcg onto the skin every 3 (three) days.    . insulin aspart protamine- aspart (NOVOLOG MIX 70/30) (70-30) 100 UNIT/ML injection Inject 20-45 Units into the skin See admin instructions. 45 units in am, 20 units in pm    . loperamide (IMODIUM) 2 MG capsule Take 2 mg by mouth as needed for diarrhea or loose stools.    Mckinley Jewel Dimesylate (RHOPRESSA) 0.02 % SOLN Place 1 drop into both eyes at bedtime.    . nitroGLYCERIN (NITRODUR - DOSED IN MG/24 HR) 0.4 mg/hr patch 0.4 mg daily.     . nitroGLYCERIN (NITROSTAT) 0.4 MG SL tablet Place 1 tablet (0.4 mg total) under the tongue every 5 (five) minutes as needed for chest pain (CP or SOB). 25 tablet 12  . omeprazole (PRILOSEC) 40 MG capsule Take 40 mg by mouth daily.     . potassium chloride SA (K-DUR,KLOR-CON) 20 MEQ tablet     . sitaGLIPtin-metformin (JANUMET) 50-500 MG tablet Take 1 tablet by mouth daily.    Marland Kitchen torsemide (DEMADEX) 20 MG tablet Take 20 mg by mouth daily.     No current facility-administered medications for this visit.     Allergies  Allergen Reactions  . Aspirin Hives  . Blue Dyes (Parenteral) Hives  . Contrast Media [Iodinated Diagnostic Agents] Other (See Comments)    unknown  . Ibuprofen Hives  . Iohexol Other (See Comments)    unknown  . Shellfish-Derived Products Hives    Review of Systems:  Constitutional: Denies fever, chills, diaphoresis, appetite change and  fatigue.  HEENT: Denies photophobia, eye pain, redness, hearing loss, ear pain, congestion, sore throat, rhinorrhea, sneezing, mouth sores, neck pain, neck stiffness and tinnitus.   Respiratory: Denies SOB, DOE, cough, chest tightness,  and wheezing.   Cardiovascular: Denies chest pain, palpitations and leg swelling.  Genitourinary: Denies dysuria, urgency, frequency, hematuria, flank pain and difficulty urinating.  Musculoskeletal: Denies myalgias, back pain, joint swelling, arthralgias and gait problem.  Skin: No rash.  Neurological: Denies dizziness, seizures, syncope, weakness, light-headedness, numbness and headaches.  Hematological: Denies adenopathy. Easy bruising, personal or family bleeding  history  Psychiatric/Behavioral: No anxiety or depression     Physical Exam:    BP 132/80   Pulse 62   Ht 5\' 7"  (1.702 m)   Wt 200 lb (90.7 kg)   BMI 31.32 kg/m  Filed Weights   07/14/17 1436  Weight: 200 lb (90.7 kg)   Constitutional:  Well-developed, in no acute distress. Psychiatric: Normal mood and affect. Behavior is normal. HEENT: Pupils normal.  Conjunctivae are normal. No scleral icterus. Neck supple.  Cardiovascular: Normal rate, regular rhythm. No edema Pulmonary/chest: Effort normal and breath sounds normal. No wheezing, rales or rhonchi. Abdominal: Soft, nondistended. Nontender. Bowel sounds active throughout. There are no masses palpable. No hepatomegaly.  Multiple well-healed scars with large anterior hernias.  No obvious obstruction. Rectal:  defered Neurological: Alert and oriented to person place and time. Skin: Skin is warm and dry. No rashes noted.  Data Reviewed: I have personally reviewed following labs and imaging studies  CBC: CBC Latest Ref Rng & Units 05/30/2017 05/30/2017 11/27/2016  WBC 4.0 - 10.5 K/uL 3.4(L) 4.6 8.0  Hemoglobin 12.0 - 15.0 g/dL 13.5 14.5 13.6  Hematocrit 36.0 - 46.0 % 42.9 45.3 42.1  Platelets 150 - 400 K/uL 200 215 257    CMP: CMP  Latest Ref Rng & Units 05/30/2017 05/30/2017 11/27/2016  Glucose 65 - 99 mg/dL 114(H) 111(H) 148(H)  BUN 6 - 20 mg/dL 21(H) 21(H) 15  Creatinine 0.44 - 1.00 mg/dL 1.05(H) 1.11(H) 1.00  Sodium 135 - 145 mmol/L 141 139 140  Potassium 3.5 - 5.1 mmol/L 4.3 4.7 4.2  Chloride 101 - 111 mmol/L 109 105 108  CO2 22 - 32 mmol/L 22 22 23   Calcium 8.9 - 10.3 mg/dL 9.3 9.7 9.9  Total Protein 6.5 - 8.1 g/dL - 8.0 8.2(H)  Total Bilirubin 0.3 - 1.2 mg/dL - 0.7 0.4  Alkaline Phos 38 - 126 U/L - 76 72  AST 15 - 41 U/L - 23 22  ALT 14 - 54 U/L - 17 16       Carmell Austria, MD 07/14/2017, 2:55 PM  Cc: [name], MD

## 2017-07-14 NOTE — Patient Instructions (Addendum)
If you are age 82 or older, your body mass index should be between 23-30. Your Body mass index is 31.32 kg/m. If this is out of the aforementioned range listed, please consider follow up with your Primary Care Provider.  If you are age 46 or younger, your body mass index should be between 19-25. Your Body mass index is 31.32 kg/m. If this is out of the aformentioned range listed, please consider follow up with your Primary Care Provider.   Follow-up in 12 weeks.  Thank you,  Dr. Jackquline Denmark

## 2017-07-15 ENCOUNTER — Other Ambulatory Visit (INDEPENDENT_AMBULATORY_CARE_PROVIDER_SITE_OTHER): Payer: Medicare Other

## 2017-07-15 DIAGNOSIS — R109 Unspecified abdominal pain: Secondary | ICD-10-CM | POA: Diagnosis not present

## 2017-07-15 DIAGNOSIS — G8929 Other chronic pain: Secondary | ICD-10-CM

## 2017-07-15 DIAGNOSIS — Z862 Personal history of diseases of the blood and blood-forming organs and certain disorders involving the immune mechanism: Secondary | ICD-10-CM

## 2017-07-15 LAB — CBC WITH DIFFERENTIAL/PLATELET
BASOS PCT: 1 % (ref 0.0–3.0)
Basophils Absolute: 0.1 10*3/uL (ref 0.0–0.1)
EOS ABS: 0.2 10*3/uL (ref 0.0–0.7)
EOS PCT: 2.9 % (ref 0.0–5.0)
HEMATOCRIT: 37.9 % (ref 36.0–46.0)
Hemoglobin: 12.4 g/dL (ref 12.0–15.0)
LYMPHS PCT: 37.2 % (ref 12.0–46.0)
Lymphs Abs: 2 10*3/uL (ref 0.7–4.0)
MCHC: 32.7 g/dL (ref 30.0–36.0)
MCV: 87.2 fl (ref 78.0–100.0)
MONO ABS: 0.4 10*3/uL (ref 0.1–1.0)
Monocytes Relative: 7.7 % (ref 3.0–12.0)
Neutro Abs: 2.8 10*3/uL (ref 1.4–7.7)
Neutrophils Relative %: 51.2 % (ref 43.0–77.0)
Platelets: 275 10*3/uL (ref 150.0–400.0)
RBC: 4.35 Mil/uL (ref 3.87–5.11)
RDW: 15.4 % (ref 11.5–15.5)
WBC: 5.5 10*3/uL (ref 4.0–10.5)

## 2017-07-16 ENCOUNTER — Telehealth: Payer: Self-pay

## 2017-07-16 NOTE — Telephone Encounter (Signed)
-----   Message from Jackquline Denmark, MD sent at 07/16/2017 10:16 AM EDT ----- Normal CBC. Please inform patient about the results. Please send a copy to family physician.

## 2017-07-16 NOTE — Telephone Encounter (Signed)
I called her with the normal CBC results, sent a copy to her PCP and mailed her a copy.

## 2017-09-29 ENCOUNTER — Ambulatory Visit
Admission: RE | Admit: 2017-09-29 | Discharge: 2017-09-29 | Disposition: A | Discharge status: Home / Self Care | Payer: Medicare Other | Source: Ambulatory Visit | Attending: Nephrology | Admitting: Nephrology

## 2017-09-29 ENCOUNTER — Other Ambulatory Visit: Payer: Self-pay | Admitting: Nephrology

## 2017-09-29 DIAGNOSIS — M8580 Other specified disorders of bone density and structure, unspecified site: Secondary | ICD-10-CM

## 2017-10-13 ENCOUNTER — Encounter: Payer: Self-pay | Admitting: Gastroenterology

## 2017-10-13 ENCOUNTER — Ambulatory Visit (INDEPENDENT_AMBULATORY_CARE_PROVIDER_SITE_OTHER): Payer: Medicare Other | Admitting: Gastroenterology

## 2017-10-13 ENCOUNTER — Other Ambulatory Visit (INDEPENDENT_AMBULATORY_CARE_PROVIDER_SITE_OTHER): Payer: Medicare Other

## 2017-10-13 VITALS — BP 124/70 | Ht 67.0 in | Wt 191.0 lb

## 2017-10-13 DIAGNOSIS — K625 Hemorrhage of anus and rectum: Secondary | ICD-10-CM | POA: Diagnosis not present

## 2017-10-13 DIAGNOSIS — Z862 Personal history of diseases of the blood and blood-forming organs and certain disorders involving the immune mechanism: Secondary | ICD-10-CM

## 2017-10-13 DIAGNOSIS — G8929 Other chronic pain: Secondary | ICD-10-CM

## 2017-10-13 DIAGNOSIS — R109 Unspecified abdominal pain: Secondary | ICD-10-CM

## 2017-10-13 NOTE — Progress Notes (Signed)
IMPRESSION and PLAN:   #1. Chronic abdominal pain:Negative EGD 08/2012 by Dr. Benson Norway except for 2 non-bleeding small AVMs,negative colonoscopy 10/2012 except for small tubular adenomas,negative enteroscopy 11/2014 except for small nonbleeding AVMs ablated with APC.Negative CT scans but without contrast.Negative upper GI with small bowel series during this admission.Patient with multiple abdominal surgeries andexpected to have intra-abdominal adhesions.  #2. History of iron deficiency anemia-likely due to small bowel AVMs. Resolved off anticoagulation. But restarted plavix due to H/O CVA. Had some rectal bleeding  - Check CBC, CMP. - FU in 6 months. - If significant anemia, as it happened in the past- may have to stop plavix. Unfortunately, may increse the risk of cardiovascular events. Pt does understand.     HPI:    Chief Complaint:   Katherine [name] is a 84 y.o. female  Brother recently passed away Now "5 girls" left. Saw BRB over last 3-4 weeks No Diarrhea or constipation. Chronic RLQ pain. No N/V Has been taking plavix ever since discharge 05/2017   Past Medical History:  Diagnosis Date  . Anemia    mild  . Atrial fibrillation ([place])   . Bowel obstruction ([place]) 2015   PSBO  . CAD (coronary artery disease)   . Carotid artery stenosis   . CHF (congestive heart failure) ([place])   . Chronic kidney disease    renal insufficiency - DR Florene Glen yearly  . Diabetes mellitus without complication ([place])    type 2  . Full dentures   . History of hiatal hernia   . Hypertension   . Mobility impaired    uses walker for ambulation  . Stroke [facility])    right sided weakness,walker  . Wears glasses     Current Outpatient Medications  Medication Sig Dispense Refill  . amLODipine-valsartan (EXFORGE) 5-320 MG tablet Take 1 tablet by mouth daily.     Marland Kitchen atorvastatin (LIPITOR) 40 MG tablet Take 40 mg by mouth daily.    . bimatoprost (LUMIGAN) 0.01 % SOLN Place 1 drop into both eyes  at bedtime.    . brimonidine (ALPHAGAN P) 0.1 % SOLN Place 1 drop into both eyes every 8 (eight) hours.     . clopidogrel (PLAVIX) 75 MG tablet Take 1 tablet (75 mg total) by mouth daily. 30 tablet 3  . dicyclomine (BENTYL) 10 MG capsule Take 1 capsule (10 mg total) by mouth 3 (three) times daily before meals. 60 capsule 1  . dorzolamide (TRUSOPT) 2 % ophthalmic solution Place 1 drop into both eyes 2 (two) times daily.    . ergocalciferol (VITAMIN D2) 50000 units capsule Take 50,000 Units by mouth once a week.    . fentaNYL (DURAGESIC - DOSED MCG/HR) 12 MCG/HR Place 12.5 mcg onto the skin every 3 (three) days.    . insulin aspart protamine- aspart (NOVOLOG MIX 70/30) (70-30) 100 UNIT/ML injection Inject 20-45 Units into the skin See admin instructions. 45 units in am, 20 units in pm    . loperamide (IMODIUM) 2 MG capsule Take 2 mg by mouth as needed for diarrhea or loose stools.    Mckinley Jewel Dimesylate (RHOPRESSA) 0.02 % SOLN Place 1 drop into both eyes at bedtime.    . nitroGLYCERIN (NITRODUR - DOSED IN MG/24 HR) 0.4 mg/hr patch 0.4 mg daily.     . nitroGLYCERIN (NITROSTAT) 0.4 MG SL tablet Place 1 tablet (0.4 mg total) under the tongue every 5 (five) minutes as needed for chest pain (CP or SOB). 25 tablet  12  . omeprazole (PRILOSEC) 40 MG capsule Take 40 mg by mouth daily.     . potassium chloride SA (K-DUR,KLOR-CON) 20 MEQ tablet     . sitaGLIPtin-metformin (JANUMET) 50-500 MG tablet Take 1 tablet by mouth daily.    Marland Kitchen torsemide (DEMADEX) 20 MG tablet Take 20 mg by mouth daily.     No current facility-administered medications for this visit.     Past Surgical History:  Procedure Laterality Date  . ABDOMINAL HYSTERECTOMY    . angiodysplasia    . APPENDECTOMY    . BREAST DUCTAL SYSTEM EXCISION  03/02/2012   Procedure: EXCISION DUCTAL SYSTEM BREAST;  Surgeon: Adin Hector, MD;  Location: [facility];  Service: General;  Laterality: Left;  . BREAST SURGERY     bx rt and  lt   . CATARACT EXTRACTION     left  . CHOLECYSTECTOMY    . COLONOSCOPY N/A 10/15/2012   Procedure: COLONOSCOPY;  Surgeon: Beryle Beams, MD;  [facility];  Service: Endoscopy;  Laterality: N/A;  . DILATION AND CURETTAGE OF UTERUS    . DVT    . ENTEROSCOPY N/A 11/17/2014   Procedure: ENTEROSCOPY;  Surgeon: Carol Ada, MD;  [facility];  Service: Endoscopy;  Laterality: N/A;  . ESOPHAGOGASTRODUODENOSCOPY N/A 09/10/2012   Procedure: ESOPHAGOGASTRODUODENOSCOPY (EGD);  Surgeon: Beryle Beams, MD;  Location: [facility];  Service: Endoscopy;  Laterality: N/A;  . EYE SURGERY     right-glaucoma  . HOT HEMOSTASIS N/A 09/10/2012   Procedure: HOT HEMOSTASIS (ARGON PLASMA COAGULATION/BICAP);  Surgeon: Beryle Beams, MD;  Location: [facility];  Service: Endoscopy;  Laterality: N/A;  . HOT HEMOSTASIS N/A 11/17/2014   Procedure: HOT HEMOSTASIS (ARGON PLASMA COAGULATION/BICAP);  Surgeon: Carol Ada, MD;  Location: [facility];  Service: Endoscopy;  Laterality: N/A;  . TUBAL LIGATION      Family History  Problem Relation Age of Onset  . Heart disease Sister   . Heart disease Brother   . Heart disease Mother   . Heart disease Father   . Heart disease Daughter     Social History   Tobacco Use  . Smoking status: Former Smoker    Last attempt to quit: 02/25/2002    Years since quitting: 15.6  . Smokeless tobacco: Never Used  Substance Use Topics  . Alcohol use: No  . Drug use: No    Allergies  Allergen Reactions  . Aspirin Hives  . Blue Dyes (Parenteral) Hives  . Contrast Media [Iodinated Diagnostic Agents] Other (See Comments)    unknown  . Ibuprofen Hives  . Iohexol Other (See Comments)    unknown  . Shellfish-Derived Products Hives     Review of Systems: All systems reviewed and negative except where noted in HPI.    Physical Exam:     BP 124/70   Ht 5\' 7"  (1.702 m)   Wt 191 lb (86.6 kg)   BMI 29.91 kg/m  @WEIGHTLAST3 @ GENERAL:  Alert,  oriented, cooperative, not in acute distress. PSYCH: :Pleasant, normal mood and affect. HEENT:  conjunctiva pink, mucous membranes moist, neck supple without masses. No jaundice. CARDIAC:  S1 S2 normal. No murmers. PULM: Normal respiratory effort, lungs CTA bilaterally, no wheezing. ABDOMEN: Inspection: No visible peristalsis, no abnormal pulsations, skin normal.  Palpation/percussion: Soft, nontender, nondistended, no rigidity, no abnormal dullness to percussion, no hepatosplenomegaly and no palpable abdominal masses.  Auscultation: Normal bowel sounds, no abdominal bruits. Rectal exam: Deferred SKIN:  turgor, no lesions seen.  Musculoskeletal:  Normal muscle tone, normal strength. NEURO: Alert and oriented x 3, no focal neurologic deficits.   Radiology Studies: Dg Thoracic Spine W/swimmers  Result Date: 09/30/2017 CLINICAL DATA:  Osteopenia; history of chronic renal insufficiency, atrial fibrillation, coronary artery disease, CHF, type II diabetes mellitus, hypertension, former smoker EXAM: THORACIC SPINE - 3 VIEWS COMPARISON:  Chest radiograph 11/27/2016, CT abdomen and pelvis 05/30/2017 FINDINGS: Twelve pairs of ribs. Minimal osseous demineralization. Mottled appearance of bones seen on prior CT exam is less evident radiographically. Minimal scattered endplate spur formation of the midthoracic spine. Vertebral body heights maintained without fracture or subluxation. Atherosclerotic calcifications aorta. Surgical clips RIGHT upper quadrant likely cholecystectomy. IVC filter noted. IMPRESSION: Mottled appearance of osseous density seen on the prior CT exam is less well demonstrated on the current radiographic study. Differential diagnosis would include osteopenia, chronic renal failure, metastatic disease not excluded. Characterization of marrow changes of the spine would be best assessed by MR imaging. Electronically Signed   By: Lavonia Dana M.D.   On: 09/30/2017 07:53   Dg Lumbar Spine  Complete  Result Date: 09/30/2017 CLINICAL DATA:  Osteopenia, abnormal CT; history atrial fibrillation, coronary artery disease, CHF, type II diabetes mellitus, hypertension, chronic renal insufficiency, former smoker EXAM: LUMBAR SPINE - COMPLETE 4+ VIEW COMPARISON:  CT abdomen and pelvis 05/30/2017 FINDINGS: Mild osseous demineralization. Slightly mottled appearance of bone density, better appreciated on prior CT exam. Vertebral body heights maintained without fracture or subluxation. No focal bone destruction. SI joints preserved. IVC filter and atherosclerotic calcifications of aorta noted. Extensive calcifications at the buttocks bilaterally question numerous injection granulomata or fat necrosis. IMPRESSION: Mottled appearance of osseous density seen on the prior CT exam is less well demonstrated on the current radiographic study. Differential diagnosis would include osteopenia, chronic renal failure, metastatic disease not excluded. Characterization of marrow changes of the spine would be best assessed by MR imaging. Electronically Signed   By: Lavonia Dana M.D.   On: 09/30/2017 07:55      Shaquon Gropp,MD 10/13/2017, 2:18 PM   CC [name], MD

## 2017-10-13 NOTE — Patient Instructions (Signed)
If you are age 82 or older, your body mass index should be between 23-30. Your Body mass index is 29.91 kg/m. If this is out of the aforementioned range listed, please consider follow up with your Primary Care Provider.  If you are age 84 or younger, your body mass index should be between 19-25. Your Body mass index is 29.91 kg/m. If this is out of the aformentioned range listed, please consider follow up with your Primary Care Provider.   Please stop at the lab on the 2nd floor [address] before you leave the office today.    Thank you,  Dr. Jackquline Denmark

## 2017-10-14 LAB — CBC WITH DIFFERENTIAL/PLATELET
BASOS ABS: 0.1 10*3/uL (ref 0.0–0.1)
Basophils Relative: 1.1 % (ref 0.0–3.0)
EOS PCT: 2.6 % (ref 0.0–5.0)
Eosinophils Absolute: 0.1 10*3/uL (ref 0.0–0.7)
HEMATOCRIT: 35.7 % — AB (ref 36.0–46.0)
Hemoglobin: 11.7 g/dL — ABNORMAL LOW (ref 12.0–15.0)
LYMPHS ABS: 1.6 10*3/uL (ref 0.7–4.0)
LYMPHS PCT: 33.6 % (ref 12.0–46.0)
MCHC: 32.8 g/dL (ref 30.0–36.0)
MCV: 86.9 fl (ref 78.0–100.0)
MONOS PCT: 8.9 % (ref 3.0–12.0)
Monocytes Absolute: 0.4 10*3/uL (ref 0.1–1.0)
NEUTROS ABS: 2.6 10*3/uL (ref 1.4–7.7)
NEUTROS PCT: 53.8 % (ref 43.0–77.0)
PLATELETS: 237 10*3/uL (ref 150.0–400.0)
RBC: 4.1 Mil/uL (ref 3.87–5.11)
RDW: 13.4 % (ref 11.5–15.5)
WBC: 4.9 10*3/uL (ref 4.0–10.5)

## 2017-10-14 LAB — COMPREHENSIVE METABOLIC PANEL
ALT: 11 U/L (ref 0–35)
AST: 17 U/L (ref 0–37)
Albumin: 4.1 g/dL (ref 3.5–5.2)
Alkaline Phosphatase: 62 U/L (ref 39–117)
BILIRUBIN TOTAL: 0.6 mg/dL (ref 0.2–1.2)
BUN: 35 mg/dL — ABNORMAL HIGH (ref 6–23)
CALCIUM: 10.1 mg/dL (ref 8.4–10.5)
CO2: 29 meq/L (ref 19–32)
Chloride: 103 mEq/L (ref 96–112)
Creatinine, Ser: 1.75 mg/dL — ABNORMAL HIGH (ref 0.40–1.20)
GFR: 35.55 mL/min — AB (ref >60.00)
GLUCOSE: 65 mg/dL — AB (ref 70–99)
POTASSIUM: 4.6 meq/L (ref 3.5–5.1)
Sodium: 142 mEq/L (ref 135–145)
Total Protein: 7.5 g/dL (ref 6.0–8.3)

## 2017-10-31 ENCOUNTER — Inpatient Hospital Stay (HOSPITAL_COMMUNITY)
Admission: EM | Admit: 2017-10-31 | Discharge: 2017-11-07 | DRG: 389 | Disposition: A | Discharge status: Home / Self Care | Payer: Medicare Other | Attending: Cardiovascular Disease | Admitting: Cardiovascular Disease

## 2017-10-31 ENCOUNTER — Emergency Department (HOSPITAL_COMMUNITY): Payer: Medicare Other

## 2017-10-31 DIAGNOSIS — I130 Hypertensive heart and chronic kidney disease with heart failure and stage 1 through stage 4 chronic kidney disease, or unspecified chronic kidney disease: Secondary | ICD-10-CM | POA: Diagnosis present

## 2017-10-31 DIAGNOSIS — Z86718 Personal history of other venous thrombosis and embolism: Secondary | ICD-10-CM

## 2017-10-31 DIAGNOSIS — I480 Paroxysmal atrial fibrillation: Secondary | ICD-10-CM | POA: Diagnosis present

## 2017-10-31 DIAGNOSIS — E860 Dehydration: Secondary | ICD-10-CM | POA: Diagnosis present

## 2017-10-31 DIAGNOSIS — Z886 Allergy status to analgesic agent status: Secondary | ICD-10-CM

## 2017-10-31 DIAGNOSIS — N189 Chronic kidney disease, unspecified: Secondary | ICD-10-CM | POA: Diagnosis present

## 2017-10-31 DIAGNOSIS — H409 Unspecified glaucoma: Secondary | ICD-10-CM | POA: Diagnosis present

## 2017-10-31 DIAGNOSIS — Z90710 Acquired absence of both cervix and uterus: Secondary | ICD-10-CM

## 2017-10-31 DIAGNOSIS — I69351 Hemiplegia and hemiparesis following cerebral infarction affecting right dominant side: Secondary | ICD-10-CM | POA: Diagnosis not present

## 2017-10-31 DIAGNOSIS — Z8719 Personal history of other diseases of the digestive system: Secondary | ICD-10-CM | POA: Diagnosis not present

## 2017-10-31 DIAGNOSIS — I509 Heart failure, unspecified: Secondary | ICD-10-CM | POA: Diagnosis present

## 2017-10-31 DIAGNOSIS — Z9842 Cataract extraction status, left eye: Secondary | ICD-10-CM

## 2017-10-31 DIAGNOSIS — Z91013 Allergy to seafood: Secondary | ICD-10-CM

## 2017-10-31 DIAGNOSIS — R109 Unspecified abdominal pain: Secondary | ICD-10-CM

## 2017-10-31 DIAGNOSIS — Q433 Congenital malformations of intestinal fixation: Secondary | ICD-10-CM | POA: Diagnosis not present

## 2017-10-31 DIAGNOSIS — E1122 Type 2 diabetes mellitus with diabetic chronic kidney disease: Secondary | ICD-10-CM | POA: Diagnosis present

## 2017-10-31 DIAGNOSIS — Z0189 Encounter for other specified special examinations: Secondary | ICD-10-CM

## 2017-10-31 DIAGNOSIS — Z9102 Food additives allergy status: Secondary | ICD-10-CM

## 2017-10-31 DIAGNOSIS — K5651 Intestinal adhesions [bands], with partial obstruction: Principal | ICD-10-CM | POA: Diagnosis present

## 2017-10-31 DIAGNOSIS — Z87891 Personal history of nicotine dependence: Secondary | ICD-10-CM | POA: Diagnosis not present

## 2017-10-31 DIAGNOSIS — Z794 Long term (current) use of insulin: Secondary | ICD-10-CM

## 2017-10-31 DIAGNOSIS — Z91041 Radiographic dye allergy status: Secondary | ICD-10-CM

## 2017-10-31 DIAGNOSIS — D649 Anemia, unspecified: Secondary | ICD-10-CM | POA: Diagnosis present

## 2017-10-31 DIAGNOSIS — R1031 Right lower quadrant pain: Secondary | ICD-10-CM | POA: Diagnosis present

## 2017-10-31 DIAGNOSIS — E441 Mild protein-calorie malnutrition: Secondary | ICD-10-CM | POA: Diagnosis present

## 2017-10-31 DIAGNOSIS — Z8249 Family history of ischemic heart disease and other diseases of the circulatory system: Secondary | ICD-10-CM | POA: Diagnosis not present

## 2017-10-31 DIAGNOSIS — R1084 Generalized abdominal pain: Secondary | ICD-10-CM

## 2017-10-31 DIAGNOSIS — R011 Cardiac murmur, unspecified: Secondary | ICD-10-CM | POA: Diagnosis present

## 2017-10-31 DIAGNOSIS — K56600 Partial intestinal obstruction, unspecified as to cause: Secondary | ICD-10-CM

## 2017-10-31 DIAGNOSIS — I2510 Atherosclerotic heart disease of native coronary artery without angina pectoris: Secondary | ICD-10-CM | POA: Diagnosis present

## 2017-10-31 DIAGNOSIS — Z6831 Body mass index (BMI) 31.0-31.9, adult: Secondary | ICD-10-CM

## 2017-10-31 DIAGNOSIS — E876 Hypokalemia: Secondary | ICD-10-CM | POA: Diagnosis present

## 2017-10-31 DIAGNOSIS — Z9049 Acquired absence of other specified parts of digestive tract: Secondary | ICD-10-CM

## 2017-10-31 DIAGNOSIS — Z79899 Other long term (current) drug therapy: Secondary | ICD-10-CM

## 2017-10-31 DIAGNOSIS — M797 Fibromyalgia: Secondary | ICD-10-CM | POA: Diagnosis present

## 2017-10-31 DIAGNOSIS — Z7902 Long term (current) use of antithrombotics/antiplatelets: Secondary | ICD-10-CM

## 2017-10-31 DIAGNOSIS — K56609 Unspecified intestinal obstruction, unspecified as to partial versus complete obstruction: Secondary | ICD-10-CM | POA: Diagnosis present

## 2017-10-31 LAB — COMPREHENSIVE METABOLIC PANEL
ALT: 14 U/L (ref 0–44)
AST: 29 U/L (ref 15–41)
Albumin: 3.6 g/dL (ref 3.5–5.0)
Alkaline Phosphatase: 60 U/L (ref 38–126)
Anion gap: 11 (ref 5–15)
BUN: 14 mg/dL (ref 8–23)
CALCIUM: 9.4 mg/dL (ref 8.9–10.3)
CO2: 20 mmol/L — AB (ref 22–32)
Chloride: 109 mmol/L (ref 98–111)
Creatinine, Ser: 0.92 mg/dL (ref 0.44–1.00)
GFR, EST NON AFRICAN AMERICAN: 56 mL/min — AB (ref >60)
Glucose, Bld: 143 mg/dL — ABNORMAL HIGH (ref 70–99)
POTASSIUM: 4.9 mmol/L (ref 3.5–5.1)
Sodium: 140 mmol/L (ref 135–145)
TOTAL PROTEIN: 7.2 g/dL (ref 6.5–8.1)
Total Bilirubin: 0.7 mg/dL (ref 0.3–1.2)

## 2017-10-31 LAB — URINALYSIS, ROUTINE W REFLEX MICROSCOPIC
BILIRUBIN URINE: NEGATIVE
GLUCOSE, UA: NEGATIVE mg/dL
Ketones, ur: NEGATIVE mg/dL
Leukocytes, UA: NEGATIVE
NITRITE: NEGATIVE
PH: 5 (ref 5.0–8.0)
Protein, ur: 100 mg/dL — AB
Specific Gravity, Urine: 1.013 (ref 1.005–1.030)

## 2017-10-31 LAB — CBC
HEMATOCRIT: 37 % (ref 36.0–46.0)
Hemoglobin: 11.4 g/dL — ABNORMAL LOW (ref 12.0–15.0)
MCH: 28.3 pg (ref 26.0–34.0)
MCHC: 30.8 g/dL (ref 30.0–36.0)
MCV: 91.8 fL (ref 78.0–100.0)
Platelets: 206 10*3/uL (ref 150–400)
RBC: 4.03 MIL/uL (ref 3.87–5.11)
RDW: 13.9 % (ref 11.5–15.5)
WBC: 4.5 10*3/uL (ref 4.0–10.5)

## 2017-10-31 LAB — GLUCOSE, CAPILLARY: GLUCOSE-CAPILLARY: 220 mg/dL — AB (ref 70–99)

## 2017-10-31 LAB — LIPASE, BLOOD: LIPASE: 41 U/L (ref 11–51)

## 2017-10-31 LAB — TROPONIN I: Troponin I: <0.03 ng/mL (ref <0.03)

## 2017-10-31 MED ORDER — MORPHINE SULFATE (PF) 2 MG/ML IV SOLN
2.0000 mg | Freq: Four times a day (QID) | INTRAVENOUS | Status: DC | PRN
  Administered 2017-11-01 – 2017-11-02 (×7): 2 mg via INTRAVENOUS
  Filled 2017-10-31 (×8): qty 1

## 2017-10-31 MED ORDER — BARIUM SULFATE 2.1 % PO SUSP
ORAL | Status: AC
  Filled 2017-10-31: qty 2

## 2017-10-31 MED ORDER — MORPHINE SULFATE (PF) 4 MG/ML IV SOLN
4.0000 mg | Freq: Once | INTRAVENOUS | Status: AC
  Administered 2017-10-31: 4 mg via INTRAVENOUS
  Filled 2017-10-31: qty 1

## 2017-10-31 MED ORDER — FENTANYL 12 MCG/HR TD PT72
12.0000 ug | MEDICATED_PATCH | TRANSDERMAL | Status: DC
  Administered 2017-11-01 – 2017-11-06 (×3): 12.5 ug via TRANSDERMAL
  Filled 2017-10-31 (×4): qty 1

## 2017-10-31 MED ORDER — METOCLOPRAMIDE HCL 5 MG/ML IJ SOLN
10.0000 mg | Freq: Once | INTRAMUSCULAR | Status: AC
  Administered 2017-10-31: 10 mg via INTRAVENOUS
  Filled 2017-10-31: qty 2

## 2017-10-31 MED ORDER — INSULIN ASPART 100 UNIT/ML ~~LOC~~ SOLN
0.0000 [IU] | Freq: Three times a day (TID) | SUBCUTANEOUS | Status: DC
  Administered 2017-11-01: 3 [IU] via SUBCUTANEOUS
  Administered 2017-11-01 (×2): 5 [IU] via SUBCUTANEOUS
  Administered 2017-11-02 (×2): 2 [IU] via SUBCUTANEOUS
  Administered 2017-11-02: 3 [IU] via SUBCUTANEOUS
  Administered 2017-11-03: 2 [IU] via SUBCUTANEOUS
  Administered 2017-11-03: 3 [IU] via SUBCUTANEOUS
  Administered 2017-11-03: 2 [IU] via SUBCUTANEOUS
  Administered 2017-11-04 (×2): 3 [IU] via SUBCUTANEOUS
  Administered 2017-11-04: 2 [IU] via SUBCUTANEOUS
  Administered 2017-11-05 (×3): 3 [IU] via SUBCUTANEOUS
  Administered 2017-11-06: 2 [IU] via SUBCUTANEOUS
  Administered 2017-11-06 (×2): 3 [IU] via SUBCUTANEOUS
  Administered 2017-11-07 (×2): 2 [IU] via SUBCUTANEOUS

## 2017-10-31 MED ORDER — DEXTROSE-NACL 5-0.45 % IV SOLN
INTRAVENOUS | Status: DC
  Administered 2017-10-31 – 2017-11-02 (×4): via INTRAVENOUS

## 2017-10-31 MED ORDER — ONDANSETRON HCL 4 MG PO TABS: 4.0000 mg | ORAL_TABLET | Freq: Four times a day (QID) | ORAL | Status: DC | PRN

## 2017-10-31 MED ORDER — SODIUM CHLORIDE 0.9 % IV BOLUS
500.0000 mL | Freq: Once | INTRAVENOUS | Status: AC
  Administered 2017-10-31: 500 mL via INTRAVENOUS

## 2017-10-31 MED ORDER — PANTOPRAZOLE SODIUM 40 MG IV SOLR
40.0000 mg | Freq: Every day | INTRAVENOUS | Status: DC
  Administered 2017-10-31 – 2017-11-06 (×7): 40 mg via INTRAVENOUS
  Filled 2017-10-31 (×7): qty 40

## 2017-10-31 MED ORDER — ONDANSETRON HCL 4 MG/2ML IJ SOLN
4.0000 mg | Freq: Four times a day (QID) | INTRAMUSCULAR | Status: DC | PRN
  Administered 2017-11-01 – 2017-11-03 (×2): 4 mg via INTRAVENOUS
  Filled 2017-10-31 (×3): qty 2

## 2017-10-31 MED ORDER — HEPARIN SODIUM (PORCINE) 5000 UNIT/ML IJ SOLN
5000.0000 [IU] | Freq: Three times a day (TID) | INTRAMUSCULAR | Status: DC
  Administered 2017-10-31 – 2017-11-07 (×20): 5000 [IU] via SUBCUTANEOUS
  Filled 2017-10-31 (×20): qty 1

## 2017-10-31 MED ORDER — ONDANSETRON HCL 4 MG/2ML IJ SOLN
4.0000 mg | Freq: Once | INTRAMUSCULAR | Status: AC
  Administered 2017-10-31: 4 mg via INTRAVENOUS
  Filled 2017-10-31: qty 2

## 2017-10-31 NOTE — H&P (Signed)
Referring Physician:  TRELLIS GUIRGUIS is an 84 y.o. female.                       Chief Complaint: Abdominal pain  HPI: 84 year old female from assisted living facility has RLQ abdominal pain with nausea, vomiting and diarrhea. She has no fever or shortness of breath. Her CT scan of abdomen and pelvis is positive for partial small bowel obstruction. She had similar episodes 4 years ago and with multiple abdominal surgeries in past she has bowel adhesions. Her blood work is near normal so far.  Past Medical History:  Diagnosis Date  . Anemia    mild  . Atrial fibrillation ([place])   . Bowel obstruction ([employer]) 2015   PSBO  . CAD (coronary artery disease)   . Carotid artery stenosis   . CHF (congestive heart failure) ([place])   . Chronic kidney disease    renal insufficiency - DR Florene Glen yearly  . Diabetes mellitus without complication ([place])    type 2  . Full dentures   . History of hiatal hernia   . Hypertension   . Mobility impaired    uses walker for ambulation  . Stroke [facility])    right sided weakness,walker  . Wears glasses       Past Surgical History:  Procedure Laterality Date  . ABDOMINAL HYSTERECTOMY    . angiodysplasia    . APPENDECTOMY    . BREAST DUCTAL SYSTEM EXCISION  03/02/2012   Procedure: EXCISION DUCTAL SYSTEM BREAST;  Surgeon: Adin Hector, MD;  Location: [facility];  Service: General;  Laterality: Left;  . BREAST SURGERY     bx rt and lt   . CATARACT EXTRACTION     left  . CHOLECYSTECTOMY    . COLONOSCOPY N/A 10/15/2012   Procedure: COLONOSCOPY;  Surgeon: Beryle Beams, MD;  Location: [facility];  Service: Endoscopy;  Laterality: N/A;  . DILATION AND CURETTAGE OF UTERUS    . DVT    . ENTEROSCOPY N/A 11/17/2014   Procedure: ENTEROSCOPY;  Surgeon: Carol Ada, MD;  Location: [facility];  Service: Endoscopy;  Laterality: N/A;  . ESOPHAGOGASTRODUODENOSCOPY N/A 09/10/2012   Procedure: ESOPHAGOGASTRODUODENOSCOPY (EGD);  Surgeon: Beryle Beams,  MD;  Location: [facility];  Service: Endoscopy;  Laterality: N/A;  . EYE SURGERY     right-glaucoma  . HOT HEMOSTASIS N/A 09/10/2012   Procedure: HOT HEMOSTASIS (ARGON PLASMA COAGULATION/BICAP);  Surgeon: Beryle Beams, MD;  Location: [facility];  Service: Endoscopy;  Laterality: N/A;  . HOT HEMOSTASIS N/A 11/17/2014   Procedure: HOT HEMOSTASIS (ARGON PLASMA COAGULATION/BICAP);  Surgeon: Carol Ada, MD;  Location: [facility];  Service: Endoscopy;  Laterality: N/A;  . TUBAL LIGATION      Family History  Problem Relation Age of Onset  . Heart disease Sister   . Heart disease Brother   . Heart disease Mother   . Heart disease Father   . Heart disease Daughter    Social History:  reports that she quit smoking about 15 years ago. She has never used smokeless tobacco. She reports that she does not drink alcohol or use drugs.  Allergies:  Allergies  Allergen Reactions  . Aspirin Hives  . Blue Dyes (Parenteral) Hives  . Contrast Media [Iodinated Diagnostic Agents] Other (See Comments)    Unknown reaction  . Ibuprofen Hives  . Iohexol Other (See Comments)    Unknown reaction  . Shellfish-Derived Products Hives     (  Not in a hospital admission)  Results for orders placed or performed during the hospital encounter of 10/31/17 (from the past 48 hour(s))  Lipase, blood     Status: None   Collection Time: 10/31/17 12:50 PM  Result Value Ref Range   Lipase 41 11 - 51 U/L    Comment: Performed at [facility], [place] [address] [place], [place] [address]  Comprehensive metabolic panel     Status: Abnormal   Collection Time: 10/31/17 12:50 PM  Result Value Ref Range   Sodium 140 135 - 145 mmol/L   Potassium 4.9 3.5 - 5.1 mmol/L   Chloride 109 98 - 111 mmol/L   CO2 20 (L) 22 - 32 mmol/L   Glucose, Bld 143 (H) 70 - 99 mg/dL   BUN 14 8 - 23 mg/dL   Creatinine, Ser 0.92 0.44 - 1.00 mg/dL   Calcium 9.4 8.9 - 10.3 mg/dL   Total Protein 7.2 6.5 - 8.1 g/dL   Albumin 3.6 3.5 -  5.0 g/dL   AST 29 15 - 41 U/L   ALT 14 0 - 44 U/L   Alkaline Phosphatase 60 38 - 126 U/L   Total Bilirubin 0.7 0.3 - 1.2 mg/dL   GFR calc non Af Amer 56 (L) >60 mL/min   GFR calc Af Amer >60 >60 mL/min    Comment: (NOTE) The eGFR has been calculated using the CKD EPI equation. This calculation has not been validated in all clinical situations. eGFR's persistently <60 mL/min signify possible Chronic Kidney Disease.    Anion gap 11 5 - 15    Comment: Performed at [facility] [address] [place], [place] [address]  CBC     Status: Abnormal   Collection Time: 10/31/17 12:50 PM  Result Value Ref Range   WBC 4.5 4.0 - 10.5 K/uL   RBC 4.03 3.87 - 5.11 MIL/uL   Hemoglobin 11.4 (L) 12.0 - 15.0 g/dL   HCT 37.0 36.0 - 46.0 %   MCV 91.8 78.0 - 100.0 fL   MCH 28.3 26.0 - 34.0 pg   MCHC 30.8 30.0 - 36.0 g/dL   RDW 13.9 11.5 - 15.5 %   Platelets 206 150 - 400 K/uL    Comment: Performed at [facility] [address], [place], [place] [address]  Troponin I     Status: None   Collection Time: 10/31/17 12:50 PM  Result Value Ref Range   Troponin I <0.03 <0.03 ng/mL    Comment: Performed at [facility] [address], [place], [place] [address]  Urinalysis, Routine w reflex microscopic     Status: Abnormal   Collection Time: 10/31/17  1:55 PM  Result Value Ref Range   Color, Urine YELLOW YELLOW   APPearance CLEAR CLEAR   Specific Gravity, Urine 1.013 1.005 - 1.030   pH 5.0 5.0 - 8.0   Glucose, UA NEGATIVE NEGATIVE mg/dL   Hgb urine dipstick SMALL (A) NEGATIVE   Bilirubin Urine NEGATIVE NEGATIVE   Ketones, ur NEGATIVE NEGATIVE mg/dL   Protein, ur 100 (A) NEGATIVE mg/dL   Nitrite NEGATIVE NEGATIVE   Leukocytes, UA NEGATIVE NEGATIVE   RBC / HPF 0-5 0 - 5 RBC/hpf   WBC, UA 0-5 0 - 5 WBC/hpf   Bacteria, UA RARE (A) NONE SEEN   Squamous Epithelial / LPF 0-5 0 - 5   Mucus PRESENT    Hyaline Casts, UA PRESENT     Comment: Performed at  [facility], [place] [address] [place], [place] [address]   Ct Abdomen Pelvis Wo Contrast  Result Date: 10/31/2017 CLINICAL DATA:  Reports n/v/d and RLQ pain that began at 0300 . Has had appendectomy and other abdominal surgeries that she cannot recall. Has IV contrast allergy. Drank app 250 cc Redi-Cat, but could not tolerate more. EXAM: CT ABDOMEN AND PELVIS WITHOUT CONTRAST TECHNIQUE: Multidetector CT imaging of the abdomen and pelvis was performed following the standard protocol without IV contrast. COMPARISON:  CT of the abdomen and pelvis on 05/30/2017 FINDINGS: Lower chest: Minimal atelectasis at the lung bases. There is dense atherosclerotic calcification of the coronary arteries. Heart size is normal. No pericardial effusion. Hepatobiliary: Cholecystectomy.  Normal appearance of the liver. Pancreas: Unremarkable. No pancreatic ductal dilatation or surrounding inflammatory changes. Spleen: Normal in size without focal abnormality. Adrenals/Urinary Tract: The kidneys and ureters are unremarkable in appearance. The bladder and visualized urethra are unremarkable. Stomach/Bowel: The stomach is normal in appearance. There is partial malrotation of small bowel loops. The ligament of Treitz is at the midline in loops descends in the RIGHT mid abdomen. Jejunal loops are not dilated. Transition point appears to be in the LEFT UPPER QUADRANT where there is tapering to normal level. No associated mass in this region. The ileal loops are normal in caliber. The colon is normal in caliber. Appendectomy. Vascular/Lymphatic: There is atherosclerotic calcification of the abdominal aorta. No aneurysm. Inferior vena cava filter is in place. No retroperitoneal or mesenteric adenopathy. Reproductive: Hysterectomy.  No adnexal mass. Other: No free pelvic fluid. Numerous calcified granulomata within the buttocks consistent with prior injection sites. Anterior abdominal wall is notable for surgical change. Musculoskeletal: Mottled appearance of the  bones without discrete lytic or blastic lesions. No acute fracture. Degenerative changes are present in the lumbar spine. IMPRESSION: 1. Partial malrotation of the small bowel. 2. Partial small bowel obstruction, primarily limited to the jejunum with transition zone in the LEFT UPPER QUADRANT. No mass identified in this region. The etiology may be related to partial malrotation or adhesions. 3. Appendectomy. 4. Coronary artery calcification. Aortic atherosclerosis. (ICD10-I70.0) 5. Cholecystectomy. 6. Hysterectomy. 7. Stable heterogeneous appearance of the bones, consistent with osteoporosis or metabolic bone disease. Electronically Signed   By: Nolon Nations M.D.   On: 10/31/2017 16:27    Review Of Systems Constitutional: No fever, chills, weight loss or gain. Eyes: No vision change, wears glasses. No discharge or pain. Ears: No hearing loss, No tinnitus. Respiratory: No asthma, COPD, pneumonias. No shortness of breath. No hemoptysis. Cardiovascular: Positive chest pain, palpitation, leg edema. Gastrointestinal: Positive nausea, vomiting, diarrhea, constipation. No GI bleed. No hepatitis. Genitourinary: No dysuria, hematuria, kidney stone. No incontinance. Neurological: No headache, stroke, seizures.  Psychiatry: No psych facility admission for anxiety, depression, suicide. No detox. Skin: No rash. Musculoskeletal: Positive joint pain, fibromyalgia. No neck pain, back pain. Lymphadenopathy: No lymphadenopathy. Hematology: Positive anemia or easy bruising.   Blood pressure (!) 172/76, pulse 82, temperature 98.5 F (36.9 C), resp. rate (!) 25, SpO2 96 %. There is no height or weight on file to calculate BMI. General appearance: alert, cooperative, appears stated age and no distress Head: Normocephalic, atraumatic. Eyes: Brown eyes, pink conjunctiva, corneas clear. PERRL, EOM's intact. Neck: No adenopathy, no carotid bruit, no JVD, supple, symmetrical, trachea midline and thyroid not  enlarged. Resp: Clear to auscultation bilaterally. Cardio: Regular rate and rhythm, S1, S2 normal, II/VI systolic murmur, no click, rub or gallop GI: Soft, generalized tenderness, bowel sounds  normal; no organomegaly. Extremities: No edema, cyanosis or clubbing. Skin: Warm and dry.  Neurologic: Alert and oriented X 3, normal strength. Normal coordination.  Assessment/Plan Acute partial small bowel obstruction Type 2 DM Dehydration CAD Hypertension H/O stroke H/O DVT H/O GI bleed with AV malformation  Admit NPO and NG suction IV fluids Sliding scale insulin for type DM Hold oral medications. IV Protonix. Surgical consultation if not improving.   Birdie Riddle, MD  10/31/2017, 7:34 PM

## 2017-10-31 NOTE — ED Triage Notes (Signed)
Pt arrives from [facility] via [facility]. reports n/v/d and RLQ pain that began at 0300, has fentanyl patch on RLQ per ems. Has had appendectomy. 20gIV and 4mg  of zofran via ems with relief

## 2017-10-31 NOTE — ED Provider Notes (Signed)
[facility] EMERGENCY DEPARTMENT Provider Note   CSN: [id number] Arrival date & time: 10/31/17  1033     History   Chief Complaint Chief Complaint  Patient presents with  . Abdominal Pain    HPI Katherine [name] is a 84 y.o. female.  She has a history of chronic abdominal pain and history of bowel obstruction.  She is complaining of acute onset of right-sided abdominal pain radiating up into her chest at about 3 AM this morning.  She rates the pain is severe.  It was associated with multiple episodes of vomiting and diarrhea.  She is not sure if there was any blood.  No fevers no chills no shortness of breath.  No urinary symptoms.  She also complains of some chronic lower extremity edema that usually improves when she wakes up in the morning but is worse during the day while she is ambulating.  The history is provided by the patient.  Abdominal Pain   This is a recurrent problem. The current episode started 6 to 12 hours ago. The problem occurs constantly. The problem has not changed since onset.The pain is associated with an unknown factor. The pain is located in the RLQ. The quality of the pain is cramping. The pain is severe. Associated symptoms include diarrhea, nausea and vomiting. Pertinent negatives include fever, constipation, dysuria, frequency and headaches. Nothing aggravates the symptoms. Nothing relieves the symptoms. Past workup includes GI consult and CT scan.    Past Medical History:  Diagnosis Date  . Anemia    mild  . Atrial fibrillation ([place])   . Bowel obstruction ([place]) 2015   PSBO  . CAD (coronary artery disease)   . Carotid artery stenosis   . CHF (congestive heart failure) ([place])   . Chronic kidney disease    renal insufficiency - DR Florene Glen yearly  . Diabetes mellitus without complication ([place])    type 2  . Full dentures   . History of hiatal hernia   . Hypertension   . Mobility impaired    uses walker for ambulation  . Stroke [facility])    right sided weakness,walker  . Wears glasses     Patient Active Problem List   Diagnosis Date Noted  . Abdominal pain   . Nausea and vomiting   . Acute colitis 05/30/2017  . Chest pain 12/21/2015  . ACS (acute coronary syndrome) ([place]) 12/21/2015  . Bowel obstruction ([place]) 10/15/2013  . Angioedema due to seafood allergy 09/11/2013  . Nipple discharge, bloody 01/07/2012    Past Surgical History:  Procedure Laterality Date  . ABDOMINAL HYSTERECTOMY    . angiodysplasia    . APPENDECTOMY    . BREAST DUCTAL SYSTEM EXCISION  03/02/2012   Procedure: EXCISION DUCTAL SYSTEM BREAST;  Surgeon: Adin Hector, MD;  Location: [place];  Service: General;  Laterality: Left;  . BREAST SURGERY     bx rt and lt   . CATARACT EXTRACTION     left  . CHOLECYSTECTOMY    . COLONOSCOPY N/A 10/15/2012   Procedure: COLONOSCOPY;  Surgeon: Beryle Beams, MD;  Location: [facility];  Service: Endoscopy;  Laterality: N/A;  . DILATION AND CURETTAGE OF UTERUS    . DVT    . ENTEROSCOPY N/A 11/17/2014   Procedure: ENTEROSCOPY;  Surgeon: Carol Ada, MD;  Location: [facility];  Service: Endoscopy;  Laterality: N/A;  . ESOPHAGOGASTRODUODENOSCOPY N/A 09/10/2012   Procedure: ESOPHAGOGASTRODUODENOSCOPY (EGD);  Surgeon: Beryle Beams, MD;  Location:  [facility];  Service: Endoscopy;  Laterality: N/A;  . EYE SURGERY     right-glaucoma  . HOT HEMOSTASIS N/A 09/10/2012   Procedure: HOT HEMOSTASIS (ARGON PLASMA COAGULATION/BICAP);  Surgeon: Beryle Beams, MD;  Location: [facility];  Service: Endoscopy;  Laterality: N/A;  . HOT HEMOSTASIS N/A 11/17/2014   Procedure: HOT HEMOSTASIS (ARGON PLASMA COAGULATION/BICAP);  Surgeon: Carol Ada, MD;  Location: [facility];  Service: Endoscopy;  Laterality: N/A;  . TUBAL LIGATION       OB History   None      Home Medications    Prior to Admission medications   Medication Sig Start Date End Date Taking? Authorizing Provider    amLODipine-valsartan (EXFORGE) 5-320 MG tablet Take 1 tablet by mouth daily.  01/14/16   [provider]  atorvastatin (LIPITOR) 40 MG tablet Take 40 mg by mouth daily.    [provider]  bimatoprost (LUMIGAN) 0.01 % SOLN Place 1 drop into both eyes at bedtime.    [provider]  brimonidine (ALPHAGAN P) 0.1 % SOLN Place 1 drop into both eyes every 8 (eight) hours.     [provider]  clopidogrel (PLAVIX) 75 MG tablet Take 1 tablet (75 mg total) by mouth daily. 06/04/17   [name], MD  dicyclomine (BENTYL) 10 MG capsule Take 1 capsule (10 mg total) by mouth 3 (three) times daily before meals. 06/04/17   [name], MD  dorzolamide (TRUSOPT) 2 % ophthalmic solution Place 1 drop into both eyes 2 (two) times daily.    [provider]  ergocalciferol (VITAMIN D2) 50000 units capsule Take 50,000 Units by mouth once a week.    [provider]  fentaNYL (DURAGESIC - DOSED MCG/HR) 12 MCG/HR Place 12.5 mcg onto the skin every 3 (three) days.    [provider]  insulin aspart protamine- aspart (NOVOLOG MIX 70/30) (70-30) 100 UNIT/ML injection Inject 20-45 Units into the skin See admin instructions. 45 units in am, 20 units in pm    [provider]  loperamide (IMODIUM) 2 MG capsule Take 2 mg by mouth as needed for diarrhea or loose stools.    [provider]  Netarsudil Dimesylate (RHOPRESSA) 0.02 % SOLN Place 1 drop into both eyes at bedtime.    [provider]  nitroGLYCERIN (NITRODUR - DOSED IN MG/24 HR) 0.4 mg/hr patch 0.4 mg daily.  01/21/16   [provider]  nitroGLYCERIN (NITROSTAT) 0.4 MG SL tablet Place 1 tablet (0.4 mg total) under the tongue every 5 (five) minutes as needed for chest pain (CP or SOB). 12/23/15   [name], MD  omeprazole (PRILOSEC) 40 MG capsule Take 40 mg by mouth daily.     [provider]  potassium chloride SA (K-DUR,KLOR-CON) 20 MEQ tablet  01/23/17    [provider]  sitaGLIPtin-metformin (JANUMET) 50-500 MG tablet Take 1 tablet by mouth daily.    [provider]  torsemide (DEMADEX) 20 MG tablet Take 20 mg by mouth daily.    [provider]    Family History Family History  Problem Relation Age of Onset  . Heart disease Sister   . Heart disease Brother   . Heart disease Mother   . Heart disease Father   . Heart disease Daughter     Social History Social History   Tobacco Use  . Smoking status: Former Smoker    Last attempt to quit: 02/25/2002    Years since quitting: 15.6  . Smokeless tobacco: Never  Used  Substance Use Topics  . Alcohol use: No  . Drug use: No     Allergies   Aspirin; Blue dyes (parenteral); Contrast media [iodinated diagnostic agents]; Ibuprofen; Iohexol; and Shellfish-derived products   Review of Systems Review of Systems  Constitutional: Negative for fever.  HENT: Negative for sore throat.   Eyes: Negative for visual disturbance.  Respiratory: Negative for shortness of breath.   Cardiovascular: Positive for leg swelling. Negative for chest pain.  Gastrointestinal: Positive for abdominal pain, diarrhea, nausea and vomiting. Negative for constipation.  Genitourinary: Negative for dysuria and frequency.  Musculoskeletal: Negative for neck pain.  Skin: Negative for rash.  Neurological: Negative for headaches.     Physical Exam Updated Vital Signs BP (!) 158/75 (BP Location: Right Arm)   Pulse 63   Temp 98.5 F (36.9 C)   Resp 19   SpO2 100%   Physical Exam  Constitutional: She appears well-developed and well-nourished. No distress.  HENT:  Head: Normocephalic and atraumatic.  Eyes: Conjunctivae are normal.  Neck: Neck supple.  Cardiovascular: Normal rate and regular rhythm.  No murmur heard. Pulmonary/Chest: Effort normal and breath sounds normal. No respiratory distress.  Abdominal: Soft. There is generalized tenderness and tenderness in the right lower  quadrant. There is no rigidity, no rebound and no guarding. No hernia.  Musculoskeletal: She exhibits edema (symmetric at ankles). She exhibits no tenderness.  Neurological: She is alert.  Skin: Skin is warm and dry. Capillary refill takes less than 2 seconds.  Psychiatric: She has a normal mood and affect.  Nursing note and vitals reviewed.    ED Treatments / Results  Labs (all labs ordered are listed, but only abnormal results are displayed) Labs Reviewed  COMPREHENSIVE METABOLIC PANEL - Abnormal; Notable for the following components:      Result Value   CO2 20 (*)    Glucose, Bld 143 (*)    GFR calc non Af Amer 56 (*)    All other components within normal limits  CBC - Abnormal; Notable for the following components:   Hemoglobin 11.4 (*)    All other components within normal limits  URINALYSIS, ROUTINE W REFLEX MICROSCOPIC - Abnormal; Notable for the following components:   Hgb urine dipstick SMALL (*)    Protein, ur 100 (*)    Bacteria, UA RARE (*)    All other components within normal limits  BASIC METABOLIC PANEL - Abnormal; Notable for the following components:   Glucose, Bld 248 (*)    GFR calc non Af Amer 59 (*)    All other components within normal limits  CBC - Abnormal; Notable for the following components:   WBC 3.0 (*)    All other components within normal limits  GLUCOSE, CAPILLARY - Abnormal; Notable for the following components:   Glucose-Capillary 220 (*)    All other components within normal limits  GLUCOSE, CAPILLARY - Abnormal; Notable for the following components:   Glucose-Capillary 222 (*)    All other components within normal limits  MRSA PCR SCREENING  LIPASE, BLOOD  TROPONIN I    EKG EKG Interpretation  Date/Time:  Saturday October 31 2017 12:57:10 EDT Ventricular Rate:  79 PR Interval:    QRS Duration: 88 QT Interval:  381 QTC Calculation: 437 R Axis:   3 Text Interpretation:  Sinus rhythm Consider left atrial enlargement Low voltage,  precordial leads Borderline T wave abnormalities similar to prior 9/18 Confirmed by Aletta Edouard [phone]) on 10/31/2017 1:08:33 PM   Radiology  Ct Abdomen Pelvis Wo Contrast  Result Date: 10/31/2017 CLINICAL DATA:  Reports n/v/d and RLQ pain that began at 0300 . Has had appendectomy and other abdominal surgeries that she cannot recall. Has IV contrast allergy. Drank app 250 cc Redi-Cat, but could not tolerate more. EXAM: CT ABDOMEN AND PELVIS WITHOUT CONTRAST TECHNIQUE: Multidetector CT imaging of the abdomen and pelvis was performed following the standard protocol without IV contrast. COMPARISON:  CT of the abdomen and pelvis on 05/30/2017 FINDINGS: Lower chest: Minimal atelectasis at the lung bases. There is dense atherosclerotic calcification of the coronary arteries. Heart size is normal. No pericardial effusion. Hepatobiliary: Cholecystectomy.  Normal appearance of the liver. Pancreas: Unremarkable. No pancreatic ductal dilatation or surrounding inflammatory changes. Spleen: Normal in size without focal abnormality. Adrenals/Urinary Tract: The kidneys and ureters are unremarkable in appearance. The bladder and visualized urethra are unremarkable. Stomach/Bowel: The stomach is normal in appearance. There is partial malrotation of small bowel loops. The ligament of Treitz is at the midline in loops descends in the RIGHT mid abdomen. Jejunal loops are not dilated. Transition point appears to be in the LEFT UPPER QUADRANT where there is tapering to normal level. No associated mass in this region. The ileal loops are normal in caliber. The colon is normal in caliber. Appendectomy. Vascular/Lymphatic: There is atherosclerotic calcification of the abdominal aorta. No aneurysm. Inferior vena cava filter is in place. No retroperitoneal or mesenteric adenopathy. Reproductive: Hysterectomy.  No adnexal mass. Other: No free pelvic fluid. Numerous calcified granulomata within the buttocks consistent with prior  injection sites. Anterior abdominal wall is notable for surgical change. Musculoskeletal: Mottled appearance of the bones without discrete lytic or blastic lesions. No acute fracture. Degenerative changes are present in the lumbar spine. IMPRESSION: 1. Partial malrotation of the small bowel. 2. Partial small bowel obstruction, primarily limited to the jejunum with transition zone in the LEFT UPPER QUADRANT. No mass identified in this region. The etiology may be related to partial malrotation or adhesions. 3. Appendectomy. 4. Coronary artery calcification. Aortic atherosclerosis. (ICD10-I70.0) 5. Cholecystectomy. 6. Hysterectomy. 7. Stable heterogeneous appearance of the bones, consistent with osteoporosis or metabolic bone disease. Electronically Signed   By: Nolon Nations M.D.   On: 10/31/2017 16:27   Dg Abd Portable 1v  Result Date: 11/01/2017 CLINICAL DATA:  Nasogastric tube placement. EXAM: PORTABLE ABDOMEN - 1 VIEW COMPARISON:  CT abdomen and pelvis October 31, 2017 FINDINGS: Nasogastric tube tip projects in proximal stomach, side-port at GE junction region. Mild gas distended stomach. Paucity of bowel gas. Surgical clips in the included right abdomen compatible with cholecystectomy. Inferior vena cava projects to the RIGHT of L2-3. Calcifications projecting RIGHT pelvis seen within the gluteal soft tissues on prior CT. IMPRESSION: Nasogastric tube tip projects in proximal stomach. Gas distended stomach. Electronically Signed   By: Elon Alas M.D.   On: 11/01/2017 02:46    Procedures Procedures (including critical care time)  Medications Ordered in ED Medications  morphine 4 MG/ML injection 4 mg (has no administration in time range)  ondansetron (ZOFRAN) injection 4 mg (has no administration in time range)  sodium chloride 0.9 % bolus 500 mL (has no administration in time range)     Initial Impression / Assessment and Plan / ED Course  I have reviewed the triage vital signs and the  nursing notes.  Pertinent labs & imaging results that were available during my care of the patient were reviewed by me and considered in my medical decision making (see chart for  details).  Clinical Course as of Nov 01 953  Sat Oct 31, 2017  1618 Patient was unable to tolerate oral contrast.   [MB]  1635 CT being read as partial bowel obstruction and partial malrotation.  She has not vomited here but feels nauseous and still has diffuse tenderness but soft.  She will need to be admitted to the hospital for further management and she is in agreement.   [MB]  1853 Was finally able to reach Dr. Doylene Canard covering for Dr. Terrence Dupont and he will evaluate the patient in the ED for admission.   [MB]    Clinical Course User Index [MB] Hayden Rasmussen, MD      Final Clinical Impressions(s) / ED Diagnoses   Final diagnoses:  Partial small bowel obstruction [facility])  Generalized abdominal pain    ED Discharge Orders    None       Hayden Rasmussen, MD 11/01/17 [id number]

## 2017-10-31 NOTE — Progress Notes (Signed)
Patient complaining of severe abdominal pain on arrival to floor. Bowel sounds hypoactive; abdomen soft,obese,tender.Bowel sounds hypoactive. MD paged.

## 2017-11-01 ENCOUNTER — Inpatient Hospital Stay (HOSPITAL_COMMUNITY): Payer: Medicare Other

## 2017-11-01 ENCOUNTER — Encounter (HOSPITAL_COMMUNITY): Payer: Self-pay | Admitting: Surgery

## 2017-11-01 ENCOUNTER — Other Ambulatory Visit: Payer: Self-pay

## 2017-11-01 LAB — CBC
HCT: 42.7 % (ref 36.0–46.0)
Hemoglobin: 13.2 g/dL (ref 12.0–15.0)
MCH: 27.8 pg (ref 26.0–34.0)
MCHC: 30.9 g/dL (ref 30.0–36.0)
MCV: 90.1 fL (ref 78.0–100.0)
Platelets: 246 10*3/uL (ref 150–400)
RBC: 4.74 MIL/uL (ref 3.87–5.11)
RDW: 13.6 % (ref 11.5–15.5)
WBC: 3 10*3/uL — ABNORMAL LOW (ref 4.0–10.5)

## 2017-11-01 LAB — BASIC METABOLIC PANEL
ANION GAP: 9 (ref 5–15)
BUN: 13 mg/dL (ref 8–23)
CO2: 22 mmol/L (ref 22–32)
Calcium: 9.3 mg/dL (ref 8.9–10.3)
Chloride: 109 mmol/L (ref 98–111)
Creatinine, Ser: 0.88 mg/dL (ref 0.44–1.00)
GFR calc Af Amer: >60 mL/min (ref >60)
GFR calc non Af Amer: 59 mL/min — ABNORMAL LOW (ref >60)
GLUCOSE: 248 mg/dL — AB (ref 70–99)
Potassium: 4.3 mmol/L (ref 3.5–5.1)
Sodium: 140 mmol/L (ref 135–145)

## 2017-11-01 LAB — GLUCOSE, CAPILLARY
GLUCOSE-CAPILLARY: 222 mg/dL — AB (ref 70–99)
GLUCOSE-CAPILLARY: 201 mg/dL — AB (ref 70–99)
Glucose-Capillary: 165 mg/dL — ABNORMAL HIGH (ref 70–99)
Glucose-Capillary: 182 mg/dL — ABNORMAL HIGH (ref 70–99)

## 2017-11-01 LAB — MRSA PCR SCREENING: MRSA by PCR: NEGATIVE

## 2017-11-01 MED ORDER — CHLORHEXIDINE GLUCONATE 0.12 % MT SOLN
15.0000 mL | Freq: Two times a day (BID) | OROMUCOSAL | Status: DC
  Administered 2017-11-01 – 2017-11-07 (×12): 15 mL via OROMUCOSAL
  Filled 2017-11-01 (×12): qty 15

## 2017-11-01 MED ORDER — ORAL CARE MOUTH RINSE
15.0000 mL | Freq: Two times a day (BID) | OROMUCOSAL | Status: DC
  Administered 2017-11-01 – 2017-11-06 (×9): 15 mL via OROMUCOSAL

## 2017-11-01 MED ORDER — WHITE PETROLATUM EX OINT
TOPICAL_OINTMENT | CUTANEOUS | Status: AC
  Administered 2017-11-01: 10:00:00
  Filled 2017-11-01: qty 28.35

## 2017-11-01 NOTE — Progress Notes (Signed)
Ref: [name], MD   Subjective:  Awake. Fair abdominal pain control. T max 99.8 degree F. Good NG suction with litre + fluid in container. Decreasing nausea and vomiting.  Objective:  Vital Signs in the last 24 hours: Temp:  [98.5 F (36.9 C)-99.8 F (37.7 C)] 99.8 F (37.7 C) (09/01 0548) Pulse Rate:  [63-86] 86 (09/01 0548) Resp:  [15-28] 15 (09/01 0548) BP: (115-186)/(66-135) 158/79 (09/01 0548) SpO2:  [94 %-100 %] 97 % (09/01 0548) Weight:  [90.3 kg] 90.3 kg (08/31 2039)  Physical Exam: BP Readings from Last 1 Encounters:  11/01/17 (!) 158/79     Wt Readings from Last 1 Encounters:  10/31/17 90.3 kg    Weight change:  Body mass index is 31.18 kg/m. HEENT: [place]/AT, Eyes-Brown, PERL, EOMI, Conjunctiva-Pink, Sclera-Non-icteric Neck: No JVD, No bruit, Trachea midline. Lungs:  Clear, Bilateral. Cardiac:  Regular rhythm, normal S1 and S2, no S3. II/VI systolic murmur. Abdomen:  Soft, generalized-tender. BS present. Extremities:  No edema present. No cyanosis. No clubbing. CNS: AxOx3, Cranial nerves grossly intact, moves all 4 extremities.  Skin: Warm and dry.   Intake/Output from previous day: 08/31 0701 - 09/01 0700 In: 1407.5 [I.V.:907.5; IV Piggyback:500] Out: 1300 [Urine:550; Emesis/NG output:750]    Lab Results: BMET    Component Value Date/Time   NA 140 11/01/2017 0631   NA 140 10/31/2017 1250   NA 142 10/13/2017 1444   K 4.3 11/01/2017 0631   K 4.9 10/31/2017 1250   K 4.6 10/13/2017 1444   CL 109 11/01/2017 0631   CL 109 10/31/2017 1250   CL 103 10/13/2017 1444   CO2 22 11/01/2017 0631   CO2 20 (L) 10/31/2017 1250   CO2 29 10/13/2017 1444   GLUCOSE 248 (H) 11/01/2017 0631   GLUCOSE 143 (H) 10/31/2017 1250   GLUCOSE 65 (L) 10/13/2017 1444   BUN 13 11/01/2017 0631   BUN 14 10/31/2017 1250   BUN 35 (H) 10/13/2017 1444   CREATININE 0.88 11/01/2017 0631   CREATININE 0.92 10/31/2017 1250   CREATININE 1.75 (H) 10/13/2017 1444   CALCIUM 9.3  11/01/2017 0631   CALCIUM 9.4 10/31/2017 1250   CALCIUM 10.1 10/13/2017 1444   GFRNONAA 59 (L) 11/01/2017 0631   GFRNONAA 56 (L) 10/31/2017 1250   GFRNONAA 47 (L) 05/30/2017 2330   GFRAA >60 11/01/2017 0631   GFRAA >60 10/31/2017 1250   GFRAA 55 (L) 05/30/2017 2330   CBC    Component Value Date/Time   WBC 3.0 (L) 11/01/2017 0631   RBC 4.74 11/01/2017 0631   HGB 13.2 11/01/2017 0631   HCT 42.7 11/01/2017 0631   PLT 246 11/01/2017 0631   MCV 90.1 11/01/2017 0631   MCH 27.8 11/01/2017 0631   MCHC 30.9 11/01/2017 0631   RDW 13.6 11/01/2017 0631   LYMPHSABS 1.6 10/13/2017 1444   MONOABS 0.4 10/13/2017 1444   EOSABS 0.1 10/13/2017 1444   BASOSABS 0.1 10/13/2017 1444   HEPATIC Function Panel Recent Labs    05/30/17 1759 10/13/17 1444 10/31/17 1250  PROT 8.0 7.5 7.2   HEMOGLOBIN A1C No components found for: HGA1C,  MPG CARDIAC ENZYMES Lab Results  Component Value Date   CKTOTAL 112 05/29/2010   CKMB 2.1 05/29/2010   TROPONINI <0.03 10/31/2017   TROPONINI <0.03 12/21/2015   TROPONINI <0.03 12/21/2015   BNP No results for input(s): PROBNP in the last 8760 hours. TSH No results for input(s): TSH in the last 8760 hours. CHOLESTEROL No results for input(s): CHOL in the  last 8760 hours.  Scheduled Meds: . chlorhexidine  15 mL Mouth Rinse BID  . fentaNYL  12.5 mcg Transdermal Q72H  . heparin  5,000 Units Subcutaneous Q8H  . insulin aspart  0-15 Units Subcutaneous TID WC  . mouth rinse  15 mL Mouth Rinse q12n4p  . pantoprazole (PROTONIX) IV  40 mg Intravenous Daily   Continuous Infusions: . dextrose 5 % and 0.45% NaCl 100 mL/hr at 11/01/17 0634   PRN Meds:.morphine injection, ondansetron **OR** ondansetron (ZOFRAN) IV  Assessment/Plan: Acute partial small bowel obstruction Type 2 DM Dehydration CAD Hypertension H/O DVT H/O Stroke H/O GI bleed with AV malformation  Continue NPO and NG to suction   LOS: 1 day    Dixie Dials  MD  11/01/2017, 11:28  AM

## 2017-11-02 ENCOUNTER — Encounter (HOSPITAL_COMMUNITY): Payer: Self-pay | Admitting: General Practice

## 2017-11-02 ENCOUNTER — Inpatient Hospital Stay (HOSPITAL_COMMUNITY): Payer: Medicare Other

## 2017-11-02 LAB — BASIC METABOLIC PANEL
Anion gap: 6 (ref 5–15)
BUN: 11 mg/dL (ref 8–23)
CHLORIDE: 109 mmol/L (ref 98–111)
CO2: 25 mmol/L (ref 22–32)
Calcium: 8.7 mg/dL — ABNORMAL LOW (ref 8.9–10.3)
Creatinine, Ser: 0.82 mg/dL (ref 0.44–1.00)
GFR calc Af Amer: >60 mL/min (ref >60)
GFR calc non Af Amer: >60 mL/min (ref >60)
GLUCOSE: 191 mg/dL — AB (ref 70–99)
Potassium: 3.4 mmol/L — ABNORMAL LOW (ref 3.5–5.1)
SODIUM: 140 mmol/L (ref 135–145)

## 2017-11-02 LAB — GLUCOSE, CAPILLARY
Glucose-Capillary: 171 mg/dL — ABNORMAL HIGH (ref 70–99)
Glucose-Capillary: 131 mg/dL — ABNORMAL HIGH (ref 70–99)
Glucose-Capillary: 134 mg/dL — ABNORMAL HIGH (ref 70–99)
Glucose-Capillary: 125 mg/dL — ABNORMAL HIGH (ref 70–99)

## 2017-11-02 MED ORDER — KCL IN DEXTROSE-NACL 20-5-0.45 MEQ/L-%-% IV SOLN
INTRAVENOUS | Status: DC
  Administered 2017-11-02 – 2017-11-06 (×9): via INTRAVENOUS
  Filled 2017-11-02 (×10): qty 1000

## 2017-11-02 MED ORDER — MORPHINE SULFATE (PF) 2 MG/ML IV SOLN
1.0000 mg | INTRAVENOUS | Status: DC | PRN
  Administered 2017-11-02 – 2017-11-07 (×19): 1 mg via INTRAVENOUS
  Filled 2017-11-02 (×19): qty 1

## 2017-11-02 NOTE — Progress Notes (Signed)
Ref: [name], MD   Subjective:  Awake. Moderate abdominal discomfort . T max 99.6 degree F  Objective:  Vital Signs in the last 24 hours: Temp:  [98.7 F (37.1 C)-99.6 F (37.6 C)] 99.6 F (37.6 C) (09/02 1259) Pulse Rate:  [75-82] 75 (09/02 1259) Resp:  [16-18] 16 (09/02 1259) BP: (136-147)/(66-67) 136/67 (09/02 1259) SpO2:  [100 %] 100 % (09/02 1259)  Physical Exam: BP Readings from Last 1 Encounters:  11/02/17 136/67     Wt Readings from Last 1 Encounters:  10/31/17 90.3 kg    Weight change:  Body mass index is 31.18 kg/m. HEENT: [place]/AT, Eyes-Brown, PERL, EOMI, Conjunctiva-Pink, Sclera-Non-icteric Neck: No JVD, No bruit, Trachea midline. Lungs:  Clear, Bilateral. Cardiac:  Regular rhythm, normal S1 and S2, no S3. II/VI systolic murmur. Abdomen:  Soft, generalized tenderness. BS present. Extremities:  No edema present. No cyanosis. No clubbing. CNS: AxOx3, Cranial nerves grossly intact, moves all 4 extremities.  Skin: Warm and dry.   Intake/Output from previous day: 09/01 0701 - 09/02 0700 In: 1581.9 [I.V.:1581.9] Out: 1500 [Urine:650; Emesis/NG output:850]    Lab Results: BMET    Component Value Date/Time   NA 140 11/02/2017 0435   NA 140 11/01/2017 0631   NA 140 10/31/2017 1250   K 3.4 (L) 11/02/2017 0435   K 4.3 11/01/2017 0631   K 4.9 10/31/2017 1250   CL 109 11/02/2017 0435   CL 109 11/01/2017 0631   CL 109 10/31/2017 1250   CO2 25 11/02/2017 0435   CO2 22 11/01/2017 0631   CO2 20 (L) 10/31/2017 1250   GLUCOSE 191 (H) 11/02/2017 0435   GLUCOSE 248 (H) 11/01/2017 0631   GLUCOSE 143 (H) 10/31/2017 1250   BUN 11 11/02/2017 0435   BUN 13 11/01/2017 0631   BUN 14 10/31/2017 1250   CREATININE 0.82 11/02/2017 0435   CREATININE 0.88 11/01/2017 0631   CREATININE 0.92 10/31/2017 1250   CALCIUM 8.7 (L) 11/02/2017 0435   CALCIUM 9.3 11/01/2017 0631   CALCIUM 9.4 10/31/2017 1250   GFRNONAA >60 11/02/2017 0435   GFRNONAA 59 (L) 11/01/2017 0631   GFRNONAA 56 (L) 10/31/2017 1250   GFRAA >60 11/02/2017 0435   GFRAA >60 11/01/2017 0631   GFRAA >60 10/31/2017 1250   CBC    Component Value Date/Time   WBC 3.0 (L) 11/01/2017 0631   RBC 4.74 11/01/2017 0631   HGB 13.2 11/01/2017 0631   HCT 42.7 11/01/2017 0631   PLT 246 11/01/2017 0631   MCV 90.1 11/01/2017 0631   MCH 27.8 11/01/2017 0631   MCHC 30.9 11/01/2017 0631   RDW 13.6 11/01/2017 0631   LYMPHSABS 1.6 10/13/2017 1444   MONOABS 0.4 10/13/2017 1444   EOSABS 0.1 10/13/2017 1444   BASOSABS 0.1 10/13/2017 1444   HEPATIC Function Panel Recent Labs    05/30/17 1759 10/13/17 1444 10/31/17 1250  PROT 8.0 7.5 7.2   HEMOGLOBIN A1C No components found for: HGA1C,  MPG CARDIAC ENZYMES Lab Results  Component Value Date   CKTOTAL 112 05/29/2010   CKMB 2.1 05/29/2010   TROPONINI <0.03 10/31/2017   TROPONINI <0.03 12/21/2015   TROPONINI <0.03 12/21/2015   BNP No results for input(s): PROBNP in the last 8760 hours. TSH No results for input(s): TSH in the last 8760 hours. CHOLESTEROL No results for input(s): CHOL in the last 8760 hours.  Scheduled Meds: . chlorhexidine  15 mL Mouth Rinse BID  . fentaNYL  12.5 mcg Transdermal Q72H  . heparin  5,000  Units Subcutaneous Q8H  . insulin aspart  0-15 Units Subcutaneous TID WC  . mouth rinse  15 mL Mouth Rinse q12n4p  . pantoprazole (PROTONIX) IV  40 mg Intravenous Daily   Continuous Infusions: . dextrose 5 % and 0.45 % NaCl with KCl 20 mEq/L 100 mL/hr at 11/02/17 1906   PRN Meds:.morphine injection, ondansetron **OR** ondansetron (ZOFRAN) IV  Assessment/Plan: Acute partial small bowel obstruction Type 2 DM CAD Hypertension H/O DVT H/O stroke H/O GI bleed with AV malformation  Surgery consult. Increase frequency of pain medication.   LOS: 2 days    Dixie Dials  MD  11/02/2017, 9:18 PM

## 2017-11-02 NOTE — Plan of Care (Signed)
  Problem: Education: Goal: Knowledge of General Education information will improve Description Including pain rating scale, medication(s)/side effects and non-pharmacologic comfort measures Outcome: Progressing   Problem: Health Behavior/Discharge Planning: Goal: Ability to manage health-related needs will improve Outcome: Progressing   Problem: Clinical Measurements: Goal: Ability to maintain clinical measurements within normal limits will improve Outcome: Progressing Goal: Will remain free from infection Outcome: Progressing   Problem: Activity: Goal: Risk for activity intolerance will decrease Outcome: Progressing   Problem: Nutrition: Goal: Adequate nutrition will be maintained Outcome: Progressing   Problem: Coping: Goal: Level of anxiety will decrease Outcome: Progressing   Problem: Elimination: Goal: Will not experience complications related to bowel motility Outcome: Progressing Goal: Will not experience complications related to urinary retention Outcome: Progressing   Problem: Pain Managment: Goal: General experience of comfort will improve Outcome: Progressing   Problem: Safety: Goal: Ability to remain free from injury will improve Outcome: Progressing   Problem: Skin Integrity: Goal: Risk for impaired skin integrity will decrease Outcome: Progressing Forestine Chute, RN 11/02/2017 11:15 PM

## 2017-11-02 NOTE — Progress Notes (Signed)
Advanced NGT 8 cm and tubing marked.

## 2017-11-02 NOTE — Social Work (Signed)
CSW acknowledging consult, aware pt from [employer]. Following for disposition.   [place], [place] [facility] [phone]

## 2017-11-03 ENCOUNTER — Inpatient Hospital Stay (HOSPITAL_COMMUNITY): Payer: Medicare Other

## 2017-11-03 LAB — GLUCOSE, CAPILLARY
Glucose-Capillary: 170 mg/dL — ABNORMAL HIGH (ref 70–99)
Glucose-Capillary: 148 mg/dL — ABNORMAL HIGH (ref 70–99)
Glucose-Capillary: 129 mg/dL — ABNORMAL HIGH (ref 70–99)
Glucose-Capillary: 133 mg/dL — ABNORMAL HIGH (ref 70–99)

## 2017-11-03 MED ORDER — DIATRIZOATE MEGLUMINE & SODIUM 66-10 % PO SOLN
90.0000 mL | Freq: Once | ORAL | Status: AC
  Administered 2017-11-03: 90 mL via NASOGASTRIC
  Filled 2017-11-03: qty 90

## 2017-11-03 NOTE — Consult Note (Addendum)
[facility] Surgery Consult Note  Katherine [name] 1930/10/16  [id number].    Requesting MD: Doylene Canard, MD Chief Complaint/Reason for Consult: pSBO  HPI:  84 y/o F with multiple medical problems who presented to [facility] from a SNF where she resides with a cc < 56Y sharp periumbilical abdominal pain associated with N/V/D. Patients reports pain woke her up from her sleep at 0300 on Saturday - she had one episode of diarrhea at this time and then developed vomiting. She desribes similar pain in the past 2/2 bowel obstructions. The pain is described as severe and sharp, intermittent, non-radiating. Denies aggravating or alleviating factors. Denies flatus/BM sine admission.  The patient is a former smoker. Denies EtOH use. Past abdominal surgeries include tubal ligation, abdominal hysterectomy, cholecystectomy, and appendectomy. Drug allergies include ASA, ibuprofen, contrast, shellfish, and blue dye. She takes plavix and states her last dose was Saturday morning.   ER workup included a CT scan of the abdomen and pelvis significant for pSBO with a transition point in the LUQ. An NG tube was placed and the patient was admitted to the hospital. General surgery has been asked to consult.   ROS: Review of Systems  Constitutional: Negative for chills and fever.  Gastrointestinal: Positive for abdominal pain, nausea and vomiting. Negative for blood in stool.  All other systems reviewed and are negative.   Family History  Problem Relation Age of Onset  . Heart disease Sister   . Heart disease Brother   . Heart disease Mother   . Heart disease Father   . Heart disease Daughter     Past Medical History:  Diagnosis Date  . Anemia    mild  . Atrial fibrillation ([place])   . Bowel obstruction ([place]) 2015   PSBO  . Bowel obstruction ([place]) 10/2017  . CAD (coronary artery disease)   . Carotid artery stenosis   . CHF (congestive heart failure) ([place])   . Chronic kidney disease    renal insufficiency -  DR Fl[place]ne [place] yearly  . Diabetes mellitus without complication ([place])    type 2  . Full dentures   . History of hiatal hernia   . Hypertension   . Mobility impaired    uses walker for ambulation  . Stroke [facility])    right sided weakness,walker  . Wears glasses     Past Surgical History:  Procedure Laterality Date  . ABDOMINAL HYSTERECTOMY    . angiodysplasia    . APPENDECTOMY    . BREAST DUCTAL SYSTEM EXCISION  03/02/2012   Procedure: EXCISION DUCTAL SYSTEM BREAST;  Surgeon: Adin Hector, MD;  Location: [facility];  Service: General;  Laterality: Left;  . BREAST SURGERY     bx rt and lt   . CATARACT EXTRACTION     left  . CHOLECYSTECTOMY    . COLONOSCOPY N/A 10/15/2012   Procedure: COLONOSCOPY;  Surgeon: Beryle Beams, MD;  Location: [facility];  Service: Endoscopy;  Laterality: N/A;  . DILATION AND CURETTAGE OF UTERUS    . DVT    . ENTEROSCOPY N/A 11/17/2014   Procedure: ENTEROSCOPY;  Surgeon: Carol Ada, MD;  Location: [facility];  Service: Endoscopy;  Laterality: N/A;  . ESOPHAGOGASTRODUODENOSCOPY N/A 09/10/2012   Procedure: ESOPHAGOGASTRODUODENOSCOPY (EGD);  Surgeon: Beryle Beams, MD;  Location: [facility];  Service: Endoscopy;  Laterality: N/A;  . EYE SURGERY     right-glaucoma  . HOT HEMOSTASIS N/A 09/10/2012   Procedure: HOT HEMOSTASIS (ARGON PLASMA COAGULATION/BICAP);  Surgeon: Saralyn Pilar  Renee Ramus, MD;  Location: [facility];  Service: Endoscopy;  Laterality: N/A;  . HOT HEMOSTASIS N/A 11/17/2014   Procedure: HOT HEMOSTASIS (ARGON PLASMA COAGULATION/BICAP);  Surgeon: Carol Ada, MD;  Location: [facility];  Service: Endoscopy;  Laterality: N/A;  . TUBAL LIGATION      Social History:  reports that she quit smoking about 15 years ago. She has never used smokeless tobacco. She reports that she does not drink alcohol or use drugs.  Allergies:  Allergies  Allergen Reactions  . Aspirin Hives  . Blue Dyes (Parenteral) Hives  . Contrast Media  [Iodinated Diagnostic Agents] Other (See Comments)    Unknown reaction  . Ibuprofen Hives  . Iohexol Other (See Comments)    Unknown reaction  . Shellfish-Derived Products Hives    Medications Prior to Admission  Medication Sig Dispense Refill  . amLODipine-valsartan (EXFORGE) 5-320 MG tablet Take 1 tablet by mouth daily.     Marland Kitchen atorvastatin (LIPITOR) 40 MG tablet Take 40 mg by mouth daily.    . bimatoprost (LUMIGAN) 0.01 % SOLN Place 1 drop into both eyes at bedtime.    . brimonidine (ALPHAGAN P) 0.1 % SOLN Place 1 drop into both eyes 3 (three) times daily.     . clopidogrel (PLAVIX) 75 MG tablet Take 1 tablet (75 mg total) by mouth daily. 30 tablet 3  . dicyclomine (BENTYL) 10 MG capsule Take 1 capsule (10 mg total) by mouth 3 (three) times daily before meals. 60 capsule 1  . ergocalciferol (VITAMIN D2) 50000 units capsule Take 50,000 Units by mouth every Saturday.     . Febuxostat (ULORIC) 80 MG TABS Take 80 mg by mouth daily.    . fentaNYL (DURAGESIC - DOSED MCG/HR) 12 MCG/HR Place 12.5 mcg onto the skin every 3 (three) days.    . insulin aspart protamine- aspart (NOVOLOG MIX 70/30) (70-30) 100 UNIT/ML injection Inject 20-45 Units into the skin See admin instructions. Inject 45 units subcutaneously every morning and 20 units at night    . loperamide (IMODIUM) 2 MG capsule Take 2 mg by mouth 3 times/day as needed-between meals & bedtime for diarrhea or loose stools.     . metoprolol tartrate (LOPRESSOR) 25 MG tablet Take 12.5 mg by mouth 2 (two) times daily.    . nitroGLYCERIN (NITRODUR - DOSED IN MG/24 HR) 0.4 mg/hr patch Place 0.4 mg onto the skin daily.     . nitroGLYCERIN (NITROSTAT) 0.4 MG SL tablet Place 1 tablet (0.4 mg total) under the tongue every 5 (five) minutes as needed for chest pain (CP or SOB). (Patient taking differently: Place 0.4 mg under the tongue every 5 (five) minutes as needed for chest pain (shortness of breath). ) 25 tablet 12  . omeprazole (PRILOSEC) 40 MG capsule  Take 40 mg by mouth daily.     . potassium chloride SA (K-DUR,KLOR-CON) 20 MEQ tablet Take 20 mEq by mouth daily.     . sitaGLIPtin-metformin (JANUMET) 50-500 MG tablet Take 1 tablet by mouth daily.    Marland Kitchen torsemide (DEMADEX) 20 MG tablet Take 10 mg by mouth daily.     . dorzolamide (TRUSOPT) 2 % ophthalmic solution Place 1 drop into both eyes 4 (four) times daily.     . pilocarpine (PILOCAR) 4 % ophthalmic solution Place 1 drop into both eyes 4 (four) times daily.      Blood pressure (!) 153/72, pulse 68, temperature 98.9 F (37.2 C), temperature source Oral, resp. rate 16, height _0  (1.702 m),  weight 88.8 kg, SpO2 99 %. Physical Exam: Physical Exam  Constitutional: She is oriented to person, place, and time. She appears well-developed and well-nourished.  Non-toxic appearance. She does not appear ill. No distress.  HENT:  Head: Normocephalic and atraumatic.  Mouth/Throat: Oropharynx is clear and moist.  Eyes: Pupils are equal, round, and reactive to light. EOM are normal. No scleral icterus.  Cardiovascular: Normal rate, regular rhythm and normal heart sounds. Exam reveals no friction rub.  No murmur heard. Pulmonary/Chest: Effort normal and breath sounds normal. No stridor. No respiratory distress. She has no rhonchi. She has no rales.  Abdominal: Soft. She exhibits distension. Bowel sounds are decreased. There is tenderness in the epigastric area and periumbilical area. There is no rebound and no guarding.  Multiple abdominal scars from hysterectomy, open cholecystectomy   Neurological: She is alert and oriented to person, place, and time.  Skin: Skin is warm and dry. Capillary refill takes less than 2 seconds. No rash noted.  Psychiatric: She has a normal mood and affect. Her behavior is normal.    Results for orders placed or performed during the hospital encounter of 10/31/17 (from the past 48 hour(s))  Glucose, capillary     Status: Abnormal   Collection Time: 11/01/17  7:58 AM   Result Value Ref Range   Glucose-Capillary 222 (H) 70 - 99 mg/dL  Glucose, capillary     Status: Abnormal   Collection Time: 11/01/17 11:33 AM  Result Value Ref Range   Glucose-Capillary 201 (H) 70 - 99 mg/dL  Glucose, capillary     Status: Abnormal   Collection Time: 11/01/17  5:11 PM  Result Value Ref Range   Glucose-Capillary 165 (H) 70 - 99 mg/dL  Glucose, capillary     Status: Abnormal   Collection Time: 11/01/17  9:10 PM  Result Value Ref Range   Glucose-Capillary 182 (H) 70 - 99 mg/dL  Basic metabolic panel     Status: Abnormal   Collection Time: 11/02/17  4:35 AM  Result Value Ref Range   Sodium 140 135 - 145 mmol/L   Potassium 3.4 (L) 3.5 - 5.1 mmol/L   Chloride 109 98 - 111 mmol/L   CO2 25 22 - 32 mmol/L   Glucose, Bld 191 (H) 70 - 99 mg/dL   BUN 11 8 - 23 mg/dL   Creatinine, Ser 0.82 0.44 - 1.00 mg/dL   Calcium 8.7 (L) 8.9 - 10.3 mg/dL   GFR calc non Af Amer >60 >60 mL/min   GFR calc Af Amer >60 >60 mL/min    Comment: (NOTE) The eGFR has been calculated using the CKD EPI equation. This calculation has not been validated in all clinical situations. eGFR's persistently <60 mL/min signify possible Chronic Kidney Disease.    Anion gap 6 5 - 15    Comment: Performed at [facility] [address] [place], [place] [address]  Glucose, capillary     Status: Abnormal   Collection Time: 11/02/17  8:18 AM  Result Value Ref Range   Glucose-Capillary 171 (H) 70 - 99 mg/dL  Glucose, capillary     Status: Abnormal   Collection Time: 11/02/17 11:58 AM  Result Value Ref Range   Glucose-Capillary 131 (H) 70 - 99 mg/dL  Glucose, capillary     Status: Abnormal   Collection Time: 11/02/17  5:12 PM  Result Value Ref Range   Glucose-Capillary 134 (H) 70 - 99 mg/dL  Glucose, capillary     Status: Abnormal  Collection Time: 11/02/17  9:34 PM  Result Value Ref Range   Glucose-Capillary 125 (H) 70 - 99 mg/dL   Dg Abd 2 Views  Result Date: 11/02/2017 CLINICAL DATA:   Abdominal pain EXAM: ABDOMEN - 2 VIEW COMPARISON:  11/01/2017 FINDINGS: A nasogastric tube is in place, tip overlying the level of the gastroesophageal junction, side port in the LOWER esophagus. Consider advancing 8-10 centimeters further into the stomach. There is persistent mild dilatation of small bowel loops within the LEFT central abdomen consistent with small bowel obstruction. Surgical clips are noted in the RIGHT UPPER QUADRANT of the abdomen. Inferior vena cava filter apex overlies L2. There are atelectatic changes at both lung bases. Coarse, dystrophic calcifications in the buttocks are consistent with injection granulomata. IMPRESSION: 1. Nasogastric tube to the gastroesophageal junction. Recommend advancing to 8-10 centimeters. 2. Persistent small bowel dilatation. Electronically Signed   By: Nolon Nations M.D.   On: 11/02/2017 10:36   Assessment/Plan DM2 CAD HTN PMH DVT  PMH CVA  PMH GI bleed with AV malformation   SBO - continue NPO, IVF - advance NG tube 10 cm and then continue NG tube to LIWS - small bowel protocol with gastrografin  - mobilize/OOB   - continue to hold plavix   We will follow.   Jill Alexanders, [facility] Surgery 11/03/2017, 7:50 AM Pager: [phone] Consults: [phone]

## 2017-11-03 NOTE — Progress Notes (Signed)
Ref: [name], MD   Subjective:  Partial small bowel obstruction and abdominal pain persists. Awake and afebrile.  Objective:  Vital Signs in the last 24 hours: Temp:  [98.9 F (37.2 C)-99.2 F (37.3 C)] 98.9 F (37.2 C) (09/03 0500) Pulse Rate:  [68-72] 68 (09/03 0500) Resp:  [16] 16 (09/03 0500) BP: (153-160)/(72-75) 153/72 (09/03 0500) SpO2:  [99 %-100 %] 99 % (09/03 0500) Weight:  [88.8 kg] 88.8 kg (09/03 0500)  Physical Exam: BP Readings from Last 1 Encounters:  11/03/17 (!) 153/72     Wt Readings from Last 1 Encounters:  11/03/17 88.8 kg    Weight change:  Body mass index is 30.66 kg/m. HEENT: [place]/AT, Eyes-Brown, PERL, EOMI, Conjunctiva-Pink, Sclera-Non-icteric Neck: No JVD, No bruit, Trachea midline. Lungs:  Clear, Bilateral. Cardiac:  Regular rhythm, normal S1 and S2, no S3. II/VI systolic murmur. Abdomen:  Soft, moderate generalized tenderness. BS present. Extremities:  No edema present. No cyanosis. No clubbing. CNS: AxOx3, Cranial nerves grossly intact, moves all 4 extremities.  Skin: Warm and dry.   Intake/Output from previous day: 09/02 0701 - 09/03 0700 In: 2227 [I.V.:2227] Out: 2350 [Urine:2050; Emesis/NG output:300]    Lab Results: BMET    Component Value Date/Time   NA 140 11/02/2017 0435   NA 140 11/01/2017 0631   NA 140 10/31/2017 1250   K 3.4 (L) 11/02/2017 0435   K 4.3 11/01/2017 0631   K 4.9 10/31/2017 1250   CL 109 11/02/2017 0435   CL 109 11/01/2017 0631   CL 109 10/31/2017 1250   CO2 25 11/02/2017 0435   CO2 22 11/01/2017 0631   CO2 20 (L) 10/31/2017 1250   GLUCOSE 191 (H) 11/02/2017 0435   GLUCOSE 248 (H) 11/01/2017 0631   GLUCOSE 143 (H) 10/31/2017 1250   BUN 11 11/02/2017 0435   BUN 13 11/01/2017 0631   BUN 14 10/31/2017 1250   CREATININE 0.82 11/02/2017 0435   CREATININE 0.88 11/01/2017 0631   CREATININE 0.92 10/31/2017 1250   CALCIUM 8.7 (L) 11/02/2017 0435   CALCIUM 9.3 11/01/2017 0631   CALCIUM 9.4 10/31/2017  1250   GFRNONAA >60 11/02/2017 0435   GFRNONAA 59 (L) 11/01/2017 0631   GFRNONAA 56 (L) 10/31/2017 1250   GFRAA >60 11/02/2017 0435   GFRAA >60 11/01/2017 0631   GFRAA >60 10/31/2017 1250   CBC    Component Value Date/Time   WBC 3.0 (L) 11/01/2017 0631   RBC 4.74 11/01/2017 0631   HGB 13.2 11/01/2017 0631   HCT 42.7 11/01/2017 0631   PLT 246 11/01/2017 0631   MCV 90.1 11/01/2017 0631   MCH 27.8 11/01/2017 0631   MCHC 30.9 11/01/2017 0631   RDW 13.6 11/01/2017 0631   LYMPHSABS 1.6 10/13/2017 1444   MONOABS 0.4 10/13/2017 1444   EOSABS 0.1 10/13/2017 1444   BASOSABS 0.1 10/13/2017 1444   HEPATIC Function Panel Recent Labs    05/30/17 1759 10/13/17 1444 10/31/17 1250  PROT 8.0 7.5 7.2   HEMOGLOBIN A1C No components found for: HGA1C,  MPG CARDIAC ENZYMES Lab Results  Component Value Date   CKTOTAL 112 05/29/2010   CKMB 2.1 05/29/2010   TROPONINI <0.03 10/31/2017   TROPONINI <0.03 12/21/2015   TROPONINI <0.03 12/21/2015   BNP No results for input(s): PROBNP in the last 8760 hours. TSH No results for input(s): TSH in the last 8760 hours. CHOLESTEROL No results for input(s): CHOL in the last 8760 hours.  Scheduled Meds: . chlorhexidine  15 mL Mouth Rinse BID  .  fentaNYL  12.5 mcg Transdermal Q72H  . heparin  5,000 Units Subcutaneous Q8H  . insulin aspart  0-15 Units Subcutaneous TID WC  . mouth rinse  15 mL Mouth Rinse q12n4p  . pantoprazole (PROTONIX) IV  40 mg Intravenous Daily   Continuous Infusions: . 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   LOS: 3 days    Dixie Dials  MD  11/03/2017, 2:18 PM

## 2017-11-03 NOTE — Plan of Care (Signed)
  Problem: Activity: Goal: Risk for activity intolerance will decrease Outcome: Progressing   

## 2017-11-03 NOTE — Care Management Important Message (Signed)
Important Message  Patient Details  Name: Katherine [name] MRN: [id number] Date of Birth: [birth year]   Medicare Important Message Given:  Yes    Orbie Pyo 11/03/2017, 4:11 PM

## 2017-11-03 NOTE — Plan of Care (Signed)
  Problem: Activity: Goal: Risk for activity intolerance will decrease Outcome: Progressing   Problem: Pain Managment: Goal: General experience of comfort will improve Outcome: Progressing   

## 2017-11-04 LAB — GLUCOSE, CAPILLARY
GLUCOSE-CAPILLARY: 132 mg/dL — AB (ref 70–99)
Glucose-Capillary: 179 mg/dL — ABNORMAL HIGH (ref 70–99)
Glucose-Capillary: 186 mg/dL — ABNORMAL HIGH (ref 70–99)
Glucose-Capillary: 164 mg/dL — ABNORMAL HIGH (ref 70–99)

## 2017-11-04 NOTE — Discharge Instructions (Signed)
Small Bowel Obstruction °A small bowel obstruction means that something is blocking the small bowel. The small bowel is also called the small intestine. It is the long tube that connects the stomach to the colon. An obstruction will stop food and fluids from passing through the small bowel. Treatment depends on what is causing the problem and how bad the problem is. °Follow these instructions at home: °· Get a lot of rest. °· Follow your diet as told by your doctor. You may need to: °? Only drink clear liquids until you start to get better. °? Avoid solid foods as told by your doctor. °· Take over-the-counter and prescription medicines only as told by your doctor. °· Keep all follow-up visits as told by your doctor. This is important. °Contact a doctor if: °· You have a fever. °· You have chills. °Get help right away if: °· You have pain or cramps that get worse. °· You throw up (vomit) blood. °· You have a feeling of being sick to your stomach (nausea) that does not go away. °· You cannot stop throwing up. °· You cannot drink fluids. °· You feel confused. °· You feel dry or thirsty (dehydrated). °· Your belly gets more bloated. °· You feel weak or you pass out (faint). °This information is not intended to replace advice given to you by your health care provider. Make sure you discuss any questions you have with your health care provider. °Document Released: 03/27/2004 Document Revised: 10/15/2015 Document Reviewed: 04/13/2014 °Elsevier Interactive Patient Education © 2018 [employer] Inc[employer] ° °

## 2017-11-04 NOTE — Progress Notes (Signed)
Ref: [name], MD   Subjective:  3 + BMs per nurse in 24 hours. T max 99.5 degree F.  Objective:  Vital Signs in the last 24 hours: Temp:  [99.1 F (37.3 C)-99.5 F (37.5 C)] 99.3 F (37.4 C) (09/04 1444) Pulse Rate:  [66-81] 66 (09/04 1444) Resp:  [16] 16 (09/04 1444) BP: (154-164)/(68-76) 154/76 (09/04 1444) SpO2:  [98 %-100 %] 100 % (09/04 1444)  Physical Exam: BP Readings from Last 1 Encounters:  11/04/17 (!) 154/76     Wt Readings from Last 1 Encounters:  11/03/17 88.8 kg    Weight change:  Body mass index is 30.66 kg/m. HEENT: [place]/AT, Eyes-Brown, PERL, EOMI, Conjunctiva-Pink, Sclera-Non-icteric Neck: No JVD, No bruit, Trachea midline. Lungs:  Clear, Bilateral. Cardiac:  Regular rhythm, normal S1 and S2, no S3. II/VI systolic murmur. Abdomen:  Soft, generalized tnederness. BS present. Extremities:  No edema present. No cyanosis. No clubbing. CNS: AxOx3, Cranial nerves grossly intact, moves all 4 extremities.  Skin: Warm and dry.   Intake/Output from previous day: 09/03 0701 - 09/04 0700 In: 1700 [I.V.:1700] Out: 1302 [Urine:951; Emesis/NG output:350; Stool:1]    Lab Results: BMET    Component Value Date/Time   NA 140 11/02/2017 0435   NA 140 11/01/2017 0631   NA 140 10/31/2017 1250   K 3.4 (L) 11/02/2017 0435   K 4.3 11/01/2017 0631   K 4.9 10/31/2017 1250   CL 109 11/02/2017 0435   CL 109 11/01/2017 0631   CL 109 10/31/2017 1250   CO2 25 11/02/2017 0435   CO2 22 11/01/2017 0631   CO2 20 (L) 10/31/2017 1250   GLUCOSE 191 (H) 11/02/2017 0435   GLUCOSE 248 (H) 11/01/2017 0631   GLUCOSE 143 (H) 10/31/2017 1250   BUN 11 11/02/2017 0435   BUN 13 11/01/2017 0631   BUN 14 10/31/2017 1250   CREATININE 0.82 11/02/2017 0435   CREATININE 0.88 11/01/2017 0631   CREATININE 0.92 10/31/2017 1250   CALCIUM 8.7 (L) 11/02/2017 0435   CALCIUM 9.3 11/01/2017 0631   CALCIUM 9.4 10/31/2017 1250   GFRNONAA >60 11/02/2017 0435   GFRNONAA 59 (L) 11/01/2017 0631    GFRNONAA 56 (L) 10/31/2017 1250   GFRAA >60 11/02/2017 0435   GFRAA >60 11/01/2017 0631   GFRAA >60 10/31/2017 1250   CBC    Component Value Date/Time   WBC 3.0 (L) 11/01/2017 0631   RBC 4.74 11/01/2017 0631   HGB 13.2 11/01/2017 0631   HCT 42.7 11/01/2017 0631   PLT 246 11/01/2017 0631   MCV 90.1 11/01/2017 0631   MCH 27.8 11/01/2017 0631   MCHC 30.9 11/01/2017 0631   RDW 13.6 11/01/2017 0631   LYMPHSABS 1.6 10/13/2017 1444   MONOABS 0.4 10/13/2017 1444   EOSABS 0.1 10/13/2017 1444   BASOSABS 0.1 10/13/2017 1444   HEPATIC Function Panel Recent Labs    05/30/17 1759 10/13/17 1444 10/31/17 1250  PROT 8.0 7.5 7.2   HEMOGLOBIN A1C No components found for: HGA1C,  MPG CARDIAC ENZYMES Lab Results  Component Value Date   CKTOTAL 112 05/29/2010   CKMB 2.1 05/29/2010   TROPONINI <0.03 10/31/2017   TROPONINI <0.03 12/21/2015   TROPONINI <0.03 12/21/2015   BNP No results for input(s): PROBNP in the last 8760 hours. TSH No results for input(s): TSH in the last 8760 hours. CHOLESTEROL No results for input(s): CHOL in the last 8760 hours.  Scheduled Meds: . chlorhexidine  15 mL Mouth Rinse BID  . fentaNYL  12.5 mcg  Transdermal Q72H  . heparin  5,000 Units Subcutaneous Q8H  . insulin aspart  0-15 Units Subcutaneous TID WC  . mouth rinse  15 mL Mouth Rinse q12n4p  . pantoprazole (PROTONIX) IV  40 mg Intravenous Daily   Continuous Infusions: . 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   LOS: 4 days    Dixie Dials  MD  11/04/2017, 5:56 PM

## 2017-11-04 NOTE — Social Work (Signed)
CSW spoke with office at [employer] [facility] remaining HIPPA compliant inquired about any care provided in community, this community is not an ALF. It is affordable housing community for seniors over the age of 89. They do not require any information regarding any resident for them to return to community.   CSW signing off. Please consult if any additional needs arise.  Alexander Mt, [place] Work [phone]

## 2017-11-04 NOTE — Care Management Note (Signed)
Case Management Note  Patient Details  Name: Katherine [name] MRN: [id number] Date of Birth: 1930-12-29  Subjective/Objective:                 SBO   Action/Plan:  Spoke w patient at bedside. She is from home alone, would like to return home at DC.  She has Aid through [employer] M-F from 11-2:30. She has used [facility] in the past and would like to use them again if needed at DC. She has a RW, cane, WC, shower seat and toilet riser at home. Her medications are delivered to her home from the pharmacy. CM will continue to follow for transition of care needs.   Expected Discharge Date:  11/04/17               Expected Discharge Plan:     In-House Referral:     Discharge planning Services  CM Consult  Post Acute Care Choice:    Choice offered to:     DME Arranged:    DME Agency:     HH Arranged:    HH Agency:     Status of Service:  In process, will continue to follow  If discussed at Long Length of Stay Meetings, dates discussed:    Additional Comments:  Carles Collet, RN 11/04/2017, 2:57 PM

## 2017-11-04 NOTE — Progress Notes (Signed)
[facility]/Trauma Progress Note      Assessment/Plan DM2 CAD HTN PMH DVT  PMH CVA  PMH GI bleed with AV malformation   SBO - small bowel protocol with gastrografin showed contrast in colon - mobilize/OOB   - having BM's, NGT output 350 in last 24hrs  FEN: DC NGT, clears VTE: SCD's, heparin ID: none Foley: none Follow up: TBD  DISPO: DC NGT, clears, ambulate    LOS: 4 days    Subjective: CC: SBO, abdominal pain  Pt states she began having crampy, intermittent, right sided abdominal pain last night. No nausea. Large BM yesterday evening and 2 BM's this am. No family at bedside.   Objective: Vital signs in last 24 hours: Temp:  [99.1 F (37.3 C)-99.5 F (37.5 C)] 99.1 F (37.3 C) (09/04 0530) Pulse Rate:  [66-81] 81 (09/04 0530) Resp:  [16] 16 (09/04 0530) BP: (132-164)/(63-71) 158/71 (09/04 0530) SpO2:  [98 %-100 %] 99 % (09/04 0530) Last BM Date: 11/04/17  Intake/Output from previous day: 09/03 0701 - 09/04 0700 In: 1700 [I.V.:1700] Out: 1302 [Urine:951; Emesis/NG output:350; Stool:1] Intake/Output this shift: Total I/O In: 987 [I.V.:987] Out: -   PE: Gen:  Alert, NAD, pleasant, cooperative HEENT: NGT in place Pulm:  Rate and effort normal Abd: Soft, ND, +BS, no HSM, TTP right side of abdomen, mild guarding, no signs of peritonitis Skin: no rashes noted, warm and dry   Anti-infectives: Anti-infectives (From admission, onward)   None      Lab Results:  No results for input(s): WBC, HGB, HCT, PLT in the last 72 hours. BMET Recent Labs    11/02/17 0435  NA 140  K 3.4*  CL 109  CO2 25  GLUCOSE 191*  BUN 11  CREATININE 0.82  CALCIUM 8.7*   PT/INR No results for input(s): LABPROT, INR in the last 72 hours. CMP     Component Value Date/Time   NA 140 11/02/2017 0435   K 3.4 (L) 11/02/2017 0435   CL 109 11/02/2017 0435   CO2 25 11/02/2017 0435   GLUCOSE 191 (H) 11/02/2017 0435   BUN 11 11/02/2017 0435   CREATININE  0.82 11/02/2017 0435   CALCIUM 8.7 (L) 11/02/2017 0435   PROT 7.2 10/31/2017 1250   ALBUMIN 3.6 10/31/2017 1250   AST 29 10/31/2017 1250   ALT 14 10/31/2017 1250   ALKPHOS 60 10/31/2017 1250   BILITOT 0.7 10/31/2017 1250   GFRNONAA >60 11/02/2017 0435   GFRAA >60 11/02/2017 0435   Lipase     Component Value Date/Time   LIPASE 41 10/31/2017 1250    Studies/Results: Dg Abd 2 Views  Result Date: 11/02/2017 CLINICAL DATA:  Abdominal pain EXAM: ABDOMEN - 2 VIEW COMPARISON:  11/01/2017 FINDINGS: A nasogastric tube is in place, tip overlying the level of the gastroesophageal junction, side port in the LOWER esophagus. Consider advancing 8-10 centimeters further into the stomach. There is persistent mild dilatation of small bowel loops within the LEFT central abdomen consistent with small bowel obstruction. Surgical clips are noted in the RIGHT UPPER QUADRANT of the abdomen. Inferior vena cava filter apex overlies L2. There are atelectatic changes at both lung bases. Coarse, dystrophic calcifications in the buttocks are consistent with injection granulomata. IMPRESSION: 1. Nasogastric tube to the gastroesophageal junction. Recommend advancing to 8-10 centimeters. 2. Persistent small bowel dilatation. Electronically Signed   By: Nolon Nations M.D.   On: 11/02/2017 10:36   Dg Abd Portable 1v-small Bowel Obstruction Protocol-initial, 8 Hr Delay  Result Date: 11/03/2017 CLINICAL DATA:  Shortness of breath. History of bowel obstruction and kidney disease. EXAM: PORTABLE ABDOMEN - 1 VIEW COMPARISON:  11/03/2017 FINDINGS: Enteric tube is not visualized within the field of view. Inferior vena caval filter is present. There is scattered residual contrast material throughout the colon. Colon is not distended. There are mildly distended gas-filled mid abdominal small bowel with mild small bowel fold thickening. This may indicate obstruction or enteritis. Soft tissue calcifications over the pelvis likely  representing injection granulomas. Degenerative changes in the spine. No radiopaque stones identified. IMPRESSION: Enteric tube is not visualized within the field of view. Mild gaseous distension of mid abdominal small bowel with small bowel fold thickening may indicate obstruction or enteritis. Residual contrast material within nondistended colon. Electronically Signed   By: Lucienne Capers M.D.   On: 11/03/2017 21:29   Dg Abd Portable 1v-small Bowel Protocol-position Verification  Result Date: 11/03/2017 CLINICAL DATA:  84 year old female. Nasogastric tube placement. Subsequent encounter. EXAM: PORTABLE ABDOMEN - 1 VIEW COMPARISON:  11/02/2017. FINDINGS: Nasogastric tube has been advanced. The tip projects at the gastric fundus level. Side hole just beyond the expected location of the gastroesophageal junction. Progressive gas distended small bowel loops measuring up to 3.6 cm with slightly thickened folds within the left abdomen. This may reflect result of partial obstruction. The possibility of free intraperitoneal air cannot be assessed on a supine view. Post cholecystectomy. Inferior vena cava filter in place. Injection granulomas. Degenerative changes lower thoracic and lumbar spine. IMPRESSION: Nasogastric tube has been advanced. The tip projects at the gastric fundus level. Side hole just beyond the expected location of the gastroesophageal junction. Progressive gas distended small bowel loops measuring up to 3.6 cm with slightly thickened folds within the left abdomen. This may reflect result of partial obstruction. Electronically Signed   By: Genia Del M.D.   On: 11/03/2017 09:08      Kalman Drape , [facility] 11/04/2017, 10:00 AM  Pager: [phone] Mon-Wed, Friday 7:00am-4:30pm Thurs 7am-11:30am  Consults: [phone]

## 2017-11-05 LAB — CBC WITH DIFFERENTIAL/PLATELET
Abs Immature Granulocytes: 0 10*3/uL (ref 0.0–0.1)
Basophils Absolute: 0 10*3/uL (ref 0.0–0.1)
Basophils Relative: 1 %
EOS PCT: 2 %
Eosinophils Absolute: 0.1 10*3/uL (ref 0.0–0.7)
HEMATOCRIT: 32.3 % — AB (ref 36.0–46.0)
HEMOGLOBIN: 10.4 g/dL — AB (ref 12.0–15.0)
IMMATURE GRANULOCYTES: 1 %
LYMPHS ABS: 2 10*3/uL (ref 0.7–4.0)
LYMPHS PCT: 34 %
MCH: 28.5 pg (ref 26.0–34.0)
MCHC: 32.2 g/dL (ref 30.0–36.0)
MCV: 88.5 fL (ref 78.0–100.0)
Monocytes Absolute: 0.6 10*3/uL (ref 0.1–1.0)
Monocytes Relative: 10 %
Neutro Abs: 3.1 10*3/uL (ref 1.7–7.7)
Neutrophils Relative %: 52 %
Platelets: 225 10*3/uL (ref 150–400)
RBC: 3.65 MIL/uL — AB (ref 3.87–5.11)
RDW: 13 % (ref 11.5–15.5)
WBC: 5.9 10*3/uL (ref 4.0–10.5)

## 2017-11-05 LAB — GLUCOSE, CAPILLARY
GLUCOSE-CAPILLARY: 175 mg/dL — AB (ref 70–99)
GLUCOSE-CAPILLARY: 114 mg/dL — AB (ref 70–99)
Glucose-Capillary: 189 mg/dL — ABNORMAL HIGH (ref 70–99)
Glucose-Capillary: 178 mg/dL — ABNORMAL HIGH (ref 70–99)

## 2017-11-05 LAB — COMPREHENSIVE METABOLIC PANEL
ALBUMIN: 2.6 g/dL — AB (ref 3.5–5.0)
ALT: 13 U/L (ref 0–44)
ANION GAP: 8 (ref 5–15)
AST: 15 U/L (ref 15–41)
Alkaline Phosphatase: 52 U/L (ref 38–126)
BILIRUBIN TOTAL: 0.8 mg/dL (ref 0.3–1.2)
BUN: 5 mg/dL — ABNORMAL LOW (ref 8–23)
CALCIUM: 8.8 mg/dL — AB (ref 8.9–10.3)
CO2: 23 mmol/L (ref 22–32)
Chloride: 110 mmol/L (ref 98–111)
Creatinine, Ser: 0.69 mg/dL (ref 0.44–1.00)
GFR calc non Af Amer: >60 mL/min (ref >60)
GLUCOSE: 195 mg/dL — AB (ref 70–99)
POTASSIUM: 4.3 mmol/L (ref 3.5–5.1)
SODIUM: 141 mmol/L (ref 135–145)
TOTAL PROTEIN: 6 g/dL — AB (ref 6.5–8.1)

## 2017-11-05 NOTE — Plan of Care (Signed)

## 2017-11-05 NOTE — Progress Notes (Signed)
[facility] Progress Note     Subjective: CC:  Still having intermittent, sharp abdominal pain. Tolerating clear liquids but reports decreased appetite. +flatus and small, loose BMs. Denies fever, chills, nausea, vomiting.   Objective: Vital signs in last 24 hours: Temp:  [98.7 F (37.1 C)-99.4 F (37.4 C)] 98.7 F (37.1 C) (09/05 0535) Pulse Rate:  [66-78] 78 (09/05 0535) Resp:  [16-18] 18 (09/04 2059) BP: (154-163)/(67-89) 163/89 (09/05 0535) SpO2:  [98 %-100 %] 98 % (09/05 0535) Weight:  [89 kg] 89 kg (09/05 0535) Last BM Date: 11/04/17  Intake/Output from previous day: 09/04 0701 - 09/05 0700 In: 3160.5 [P.O.:100; I.V.:3060.5] Out: 200 [Urine:100; Emesis/NG output:100] Intake/Output this shift: No intake/output data recorded.  PE: Gen:  Alert, NAD, pleasant Card:  Regular rate and rhythm, pedal pulses 2+ BL Pulm:  Normal effort, clear to auscultation bilaterally Abd: Soft, mild TTP RLQ and periumbilical region without peritonitis, +BS, previous surgical scars. Skin: warm and dry, no rashes  Psych: A&Ox3   Lab Results:  Recent Labs    11/05/17 0458  WBC 5.9  HGB 10.4*  HCT 32.3*  PLT 225   BMET Recent Labs    11/05/17 0458  NA 141  K 4.3  CL 110  CO2 23  GLUCOSE 195*  BUN 5*  CREATININE 0.69  CALCIUM 8.8*   PT/INR No results for input(s): LABPROT, INR in the last 72 hours. CMP     Component Value Date/Time   NA 141 11/05/2017 0458   K 4.3 11/05/2017 0458   CL 110 11/05/2017 0458   CO2 23 11/05/2017 0458   GLUCOSE 195 (H) 11/05/2017 0458   BUN 5 (L) 11/05/2017 0458   CREATININE 0.69 11/05/2017 0458   CALCIUM 8.8 (L) 11/05/2017 0458   PROT 6.0 (L) 11/05/2017 0458   ALBUMIN 2.6 (L) 11/05/2017 0458   AST 15 11/05/2017 0458   ALT 13 11/05/2017 0458   ALKPHOS 52 11/05/2017 0458   BILITOT 0.8 11/05/2017 0458   GFRNONAA >60 11/05/2017 0458   GFRAA >60 11/05/2017 0458   Lipase     Component Value Date/Time   LIPASE 41  10/31/2017 1250       Studies/Results: Dg Abd Portable 1v-small Bowel Obstruction Protocol-initial, 8 Hr Delay  Result Date: 11/03/2017 CLINICAL DATA:  Shortness of breath. History of bowel obstruction and kidney disease. EXAM: PORTABLE ABDOMEN - 1 VIEW COMPARISON:  11/03/2017 FINDINGS: Enteric tube is not visualized within the field of view. Inferior vena caval filter is present. There is scattered residual contrast material throughout the colon. Colon is not distended. There are mildly distended gas-filled mid abdominal small bowel with mild small bowel fold thickening. This may indicate obstruction or enteritis. Soft tissue calcifications over the pelvis likely representing injection granulomas. Degenerative changes in the spine. No radiopaque stones identified. IMPRESSION: Enteric tube is not visualized within the field of view. Mild gaseous distension of mid abdominal small bowel with small bowel fold thickening may indicate obstruction or enteritis. Residual contrast material within nondistended colon. Electronically Signed   By: Lucienne Capers M.D.   On: 11/03/2017 21:29    Anti-infectives: Anti-infectives (From admission, onward)   None     Assessment/Plan CAD HTN PMH DVT  PMH CVA  PMH GI bleed with AV malformation   SBO - small bowel protocol with gastrografin showed contrast in colon - tolerating clears, advance to SOFT as tolerated  - mobilize/OOB  FEN: DC NGT, clears VTE: SCD's, heparin ID: none Foley: none Follow up:  TBD  DISPO: advance diet, ambulate    LOS: 5 days    Obie Dredge, [facility] Pager: [phone]

## 2017-11-05 NOTE — Progress Notes (Signed)
Ref: Katherine Forward, MD   Subjective:  Abdominal pain continues. Tolerating small liquid feeds. T max 99.4 degree F.   Objective:  Vital Signs in the last 24 hours: Temp:  [98.7 F (37.1 C)-99.4 F (37.4 C)] 98.7 F (37.1 C) (09/05 0535) Pulse Rate:  [66-78] 78 (09/05 0535) Resp:  [16-18] 18 (09/04 2059) BP: (154-163)/(67-89) 163/89 (09/05 0535) SpO2:  [98 %-100 %] 98 % (09/05 0535) Weight:  [89 kg] 89 kg (09/05 0535)  Physical Exam: BP Readings from Last 1 Encounters:  11/05/17 (!) 163/89     Wt Readings from Last 1 Encounters:  11/05/17 89 kg    Weight change:  Body mass index is 30.73 kg/m. HEENT: [place]/AT, Eyes-Brown, PERL, EOMI, Conjunctiva-Pink, Sclera-Non-icteric Neck: No JVD, No bruit, Trachea midline. Lungs:  Clear, Bilateral. Cardiac:  Regular rhythm, normal S1 and S2, no S3. II/VI systolic murmur. Abdomen:  Soft, decreasing abdominal tenderness. BS present. Extremities:  No edema present. No cyanosis. No clubbing. CNS: AxOx3, Cranial nerves grossly intact, moves all 4 extremities.  Skin: Warm and dry.   Intake/Output from previous day: 09/04 0701 - 09/05 0700 In: 3160.5 [P.O.:100; I.V.:3060.5] Out: 200 [Urine:100; Emesis/NG output:100]    Lab Results: BMET    Component Value Date/Time   NA 141 11/05/2017 0458   NA 140 11/02/2017 0435   NA 140 11/01/2017 0631   K 4.3 11/05/2017 0458   K 3.4 (L) 11/02/2017 0435   K 4.3 11/01/2017 0631   CL 110 11/05/2017 0458   CL 109 11/02/2017 0435   CL 109 11/01/2017 0631   CO2 23 11/05/2017 0458   CO2 25 11/02/2017 0435   CO2 22 11/01/2017 0631   GLUCOSE 195 (H) 11/05/2017 0458   GLUCOSE 191 (H) 11/02/2017 0435   GLUCOSE 248 (H) 11/01/2017 0631   BUN 5 (L) 11/05/2017 0458   BUN 11 11/02/2017 0435   BUN 13 11/01/2017 0631   CREATININE 0.69 11/05/2017 0458   CREATININE 0.82 11/02/2017 0435   CREATININE 0.88 11/01/2017 0631   CALCIUM 8.8 (L) 11/05/2017 0458   CALCIUM 8.7 (L) 11/02/2017 0435   CALCIUM 9.3  11/01/2017 0631   GFRNONAA >60 11/05/2017 0458   GFRNONAA >60 11/02/2017 0435   GFRNONAA 59 (L) 11/01/2017 0631   GFRAA >60 11/05/2017 0458   GFRAA >60 11/02/2017 0435   GFRAA >60 11/01/2017 0631   CBC    Component Value Date/Time   WBC 5.9 11/05/2017 0458   RBC 3.65 (L) 11/05/2017 0458   HGB 10.4 (L) 11/05/2017 0458   HCT 32.3 (L) 11/05/2017 0458   PLT 225 11/05/2017 0458   MCV 88.5 11/05/2017 0458   MCH 28.5 11/05/2017 0458   MCHC 32.2 11/05/2017 0458   RDW 13.0 11/05/2017 0458   LYMPHSABS 2.0 11/05/2017 0458   MONOABS 0.6 11/05/2017 0458   EOSABS 0.1 11/05/2017 0458   BASOSABS 0.0 11/05/2017 0458   HEPATIC Function Panel Recent Labs    10/13/17 1444 10/31/17 1250 11/05/17 0458  PROT 7.5 7.2 6.0*   HEMOGLOBIN A1C No components found for: HGA1C,  MPG CARDIAC ENZYMES Lab Results  Component Value Date   CKTOTAL 112 05/29/2010   CKMB 2.1 05/29/2010   TROPONINI <0.03 10/31/2017   TROPONINI <0.03 12/21/2015   TROPONINI <0.03 12/21/2015   BNP No results for input(s): PROBNP in the last 8760 hours. TSH No results for input(s): TSH in the last 8760 hours. CHOLESTEROL No results for input(s): CHOL in the last 8760 hours.  Scheduled Meds: . chlorhexidine  15 mL Mouth Rinse BID  . fentaNYL  12.5 mcg Transdermal Q72H  . heparin  5,000 Units Subcutaneous Q8H  . insulin aspart  0-15 Units Subcutaneous TID WC  . mouth rinse  15 mL Mouth Rinse q12n4p  . pantoprazole (PROTONIX) IV  40 mg Intravenous Daily   Continuous Infusions: . dextrose 5 % and 0.45 % NaCl with KCl 20 mEq/L 50 mL/hr at 11/05/17 0944   PRN Meds:.morphine injection, ondansetron **OR** ondansetron (ZOFRAN) IV  Assessment/Plan: Acute small bowel partial obstruction Type 2 DM CAD HTN H/O DVT H/O stroke H/O GI bleed from AVM  Continue diet as tolerated. Increase activity.   LOS: 5 days    Dixie Dials  MD  11/05/2017, 10:23 AM

## 2017-11-05 NOTE — Plan of Care (Signed)

## 2017-11-06 LAB — GLUCOSE, CAPILLARY
GLUCOSE-CAPILLARY: 150 mg/dL — AB (ref 70–99)
GLUCOSE-CAPILLARY: 198 mg/dL — AB (ref 70–99)
GLUCOSE-CAPILLARY: 159 mg/dL — AB (ref 70–99)
GLUCOSE-CAPILLARY: 128 mg/dL — AB (ref 70–99)

## 2017-11-06 MED ORDER — ENSURE ENLIVE PO LIQD: 237.0000 mL | Freq: Two times a day (BID) | ORAL | Status: DC

## 2017-11-06 MED ORDER — ATORVASTATIN CALCIUM 40 MG PO TABS
40.0000 mg | ORAL_TABLET | Freq: Every day | ORAL | Status: DC
  Administered 2017-11-06 – 2017-11-07 (×2): 40 mg via ORAL
  Filled 2017-11-06 (×3): qty 1

## 2017-11-06 MED ORDER — AMLODIPINE BESYLATE 5 MG PO TABS
5.0000 mg | ORAL_TABLET | Freq: Every day | ORAL | Status: DC
  Administered 2017-11-06 – 2017-11-07 (×2): 5 mg via ORAL
  Filled 2017-11-06 (×3): qty 1

## 2017-11-06 MED ORDER — LATANOPROST 0.005 % OP SOLN
1.0000 [drp] | Freq: Every day | OPHTHALMIC | Status: DC
  Administered 2017-11-06: 1 [drp] via OPHTHALMIC
  Filled 2017-11-06: qty 2.5

## 2017-11-06 MED ORDER — DOCUSATE SODIUM 100 MG PO CAPS
100.0000 mg | ORAL_CAPSULE | Freq: Two times a day (BID) | ORAL | Status: DC
  Administered 2017-11-06 – 2017-11-07 (×2): 100 mg via ORAL
  Filled 2017-11-06 (×3): qty 1

## 2017-11-06 MED ORDER — LOSARTAN POTASSIUM 50 MG PO TABS
25.0000 mg | ORAL_TABLET | Freq: Every day | ORAL | Status: DC
  Administered 2017-11-06 – 2017-11-07 (×2): 25 mg via ORAL
  Filled 2017-11-06 (×3): qty 1

## 2017-11-06 MED ORDER — POLYETHYLENE GLYCOL 3350 17 G PO PACK
17.0000 g | PACK | Freq: Every day | ORAL | Status: DC
  Administered 2017-11-06: 17 g via ORAL
  Filled 2017-11-06 (×3): qty 1

## 2017-11-06 MED ORDER — PANTOPRAZOLE SODIUM 40 MG PO PACK
40.0000 mg | PACK | Freq: Every day | ORAL | Status: DC
  Administered 2017-11-06 – 2017-11-07 (×2): 40 mg via ORAL
  Filled 2017-11-06 (×2): qty 20

## 2017-11-06 MED ORDER — METFORMIN HCL 500 MG PO TABS
250.0000 mg | ORAL_TABLET | Freq: Two times a day (BID) | ORAL | Status: DC
  Administered 2017-11-06 – 2017-11-07 (×2): 250 mg via ORAL
  Filled 2017-11-06 (×4): qty 1

## 2017-11-06 MED ORDER — LINAGLIPTIN 5 MG PO TABS
5.0000 mg | ORAL_TABLET | Freq: Every day | ORAL | Status: DC
  Administered 2017-11-06 – 2017-11-07 (×2): 5 mg via ORAL
  Filled 2017-11-06 (×3): qty 1

## 2017-11-06 MED ORDER — CLOPIDOGREL BISULFATE 75 MG PO TABS
75.0000 mg | ORAL_TABLET | Freq: Every day | ORAL | Status: DC
  Administered 2017-11-06 – 2017-11-07 (×2): 75 mg via ORAL
  Filled 2017-11-06 (×3): qty 1

## 2017-11-06 MED ORDER — BRIMONIDINE TARTRATE 0.15 % OP SOLN
1.0000 [drp] | Freq: Three times a day (TID) | OPHTHALMIC | Status: DC
  Administered 2017-11-06 – 2017-11-07 (×4): 1 [drp] via OPHTHALMIC
  Filled 2017-11-06: qty 5

## 2017-11-06 MED ORDER — BOOST / RESOURCE BREEZE PO LIQD CUSTOM
237.0000 mL | Freq: Three times a day (TID) | ORAL | Status: DC
  Administered 2017-11-06 – 2017-11-07 (×2): 1 via ORAL

## 2017-11-06 NOTE — Progress Notes (Signed)
Ref: [name], MD   Subjective:  Feeling little better. Decreased appetite. Afebrile.  Objective:  Vital Signs in the last 24 hours: Temp:  [98.8 F (37.1 C)-98.9 F (37.2 C)] 98.9 F (37.2 C) (09/06 0600) Pulse Rate:  [57-73] 73 (09/06 0600) Resp:  [18] 18 (09/05 1510) BP: (143-166)/(70-98) 166/98 (09/06 0600) SpO2:  [99 %-100 %] 99 % (09/06 0600)  Physical Exam: BP Readings from Last 1 Encounters:  11/06/17 (!) 166/98     Wt Readings from Last 1 Encounters:  11/05/17 89 kg    Weight change:  Body mass index is 30.73 kg/m. HEENT: [place]/AT, Eyes-Brown, PERL, EOMI, Conjunctiva-Pink, Sclera-Non-icteric Neck: No JVD, No bruit, Trachea midline. Lungs:  Clear, Bilateral. Cardiac:  Regular rhythm, normal S1 and S2, no S3. II/VI systolic murmur. Abdomen:  Soft, epigastric and umbilical area tender. BS present. Extremities:  No edema present. No cyanosis. No clubbing. CNS: AxOx3, Cranial nerves grossly intact, moves all 4 extremities.  Skin: Warm and dry.   Intake/Output from previous day: 09/05 0701 - 09/06 0700 In: 1073.1 [P.O.:300; I.V.:773.1] Out: 700 [Urine:700]    Lab Results: BMET    Component Value Date/Time   NA 141 11/05/2017 0458   NA 140 11/02/2017 0435   NA 140 11/01/2017 0631   K 4.3 11/05/2017 0458   K 3.4 (L) 11/02/2017 0435   K 4.3 11/01/2017 0631   CL 110 11/05/2017 0458   CL 109 11/02/2017 0435   CL 109 11/01/2017 0631   CO2 23 11/05/2017 0458   CO2 25 11/02/2017 0435   CO2 22 11/01/2017 0631   GLUCOSE 195 (H) 11/05/2017 0458   GLUCOSE 191 (H) 11/02/2017 0435   GLUCOSE 248 (H) 11/01/2017 0631   BUN 5 (L) 11/05/2017 0458   BUN 11 11/02/2017 0435   BUN 13 11/01/2017 0631   CREATININE 0.69 11/05/2017 0458   CREATININE 0.82 11/02/2017 0435   CREATININE 0.88 11/01/2017 0631   CALCIUM 8.8 (L) 11/05/2017 0458   CALCIUM 8.7 (L) 11/02/2017 0435   CALCIUM 9.3 11/01/2017 0631   GFRNONAA >60 11/05/2017 0458   GFRNONAA >60 11/02/2017 0435    GFRNONAA 59 (L) 11/01/2017 0631   GFRAA >60 11/05/2017 0458   GFRAA >60 11/02/2017 0435   GFRAA >60 11/01/2017 0631   CBC    Component Value Date/Time   WBC 5.9 11/05/2017 0458   RBC 3.65 (L) 11/05/2017 0458   HGB 10.4 (L) 11/05/2017 0458   HCT 32.3 (L) 11/05/2017 0458   PLT 225 11/05/2017 0458   MCV 88.5 11/05/2017 0458   MCH 28.5 11/05/2017 0458   MCHC 32.2 11/05/2017 0458   RDW 13.0 11/05/2017 0458   LYMPHSABS 2.0 11/05/2017 0458   MONOABS 0.6 11/05/2017 0458   EOSABS 0.1 11/05/2017 0458   BASOSABS 0.0 11/05/2017 0458   HEPATIC Function Panel Recent Labs    10/13/17 1444 10/31/17 1250 11/05/17 0458  PROT 7.5 7.2 6.0*   HEMOGLOBIN A1C No components found for: HGA1C,  MPG CARDIAC ENZYMES Lab Results  Component Value Date   CKTOTAL 112 05/29/2010   CKMB 2.1 05/29/2010   TROPONINI <0.03 10/31/2017   TROPONINI <0.03 12/21/2015   TROPONINI <0.03 12/21/2015   BNP No results for input(s): PROBNP in the last 8760 hours. TSH No results for input(s): TSH in the last 8760 hours. CHOLESTEROL No results for input(s): CHOL in the last 8760 hours.  Scheduled Meds: . amLODipine  5 mg Oral Daily  . atorvastatin  40 mg Oral Daily  . brimonidine  1 drop Both Eyes TID  . chlorhexidine  15 mL Mouth Rinse BID  . clopidogrel  75 mg Oral Daily  . docusate sodium  100 mg Oral BID  . feeding supplement  237 mL Oral TID WC  . fentaNYL  12.5 mcg Transdermal Q72H  . heparin  5,000 Units Subcutaneous Q8H  . insulin aspart  0-15 Units Subcutaneous TID WC  . latanoprost  1 drop Both Eyes QHS  . linagliptin  5 mg Oral Daily  . losartan  25 mg Oral Daily  . mouth rinse  15 mL Mouth Rinse q12n4p  . metFORMIN  250 mg Oral BID WC  . pantoprazole sodium  40 mg Oral Daily  . polyethylene glycol  17 g Oral Daily   Continuous Infusions: . dextrose 5 % and 0.45 % NaCl with KCl 20 mEq/L 50 mL/hr at 11/06/17 0855   PRN Meds:.morphine injection, ondansetron **OR** ondansetron (ZOFRAN)  IV  Assessment/Plan: Acute small bowel partial tenderness Type 2 DM CAD HTN, essential H/O DVT H/O stroke H/O GI bleed from AVM  Add Boost. Add most of the home medications in smaller dose or size or liquid form. Home in AM if stable. Decrease IV fluids.   LOS: 6 days    Dixie Dials  MD  11/06/2017, 10:22 AM

## 2017-11-06 NOTE — Final Consult Note (Signed)
Consultant Final Sign-Off Note    Assessment/Final recommendations  Katherine [name] is a 84 y.o. female followed by me for SBO   Wound care (if applicable):    Diet at discharge: soft for 2 weeks then regular diet   Activity at discharge: per primary team   Follow-up appointment:  None needed   Pending results:  Unresulted Labs (From admission, onward)   None       Medication recommendations: stool softener daily and miralax PRN   Other recommendations:    Thank you for allowing [place] to participate in the care of your patient!  Please consult [place] again if you have further needs for your patient.  Kalman Drape 11/06/2017 9:11 AM    Subjective   CC: abdominal pain  Pt states her pain is different than when she came in. The pain that brought her to the hospital was around her umbilicus. She has had "stomach problems" for a while now. She states she has intermittent right sided abdominal pain at baseline. She states she has talked to her primary doctor about this pain in the past. She is not eating much cause nothing tastes good to her. She is willing to try Ensure and solid food. She is still having flatus. No BM today. No nausea or vomiting. No bloating.   Objective  Vital signs in last 24 hours: Temp:  [98.8 F (37.1 C)-98.9 F (37.2 C)] 98.9 F (37.2 C) (09/06 0600) Pulse Rate:  [57-73] 73 (09/06 0600) Resp:  [18] 18 (09/05 1510) BP: (143-166)/(70-98) 166/98 (09/06 0600) SpO2:  [99 %-100 %] 99 % (09/06 0600)  PE: Gen:  Alert, NAD, pleasant, cooperative Pulm:  Rate and effort normal Abd: Soft, ND, +BS, no HSM, no TTP, no signs of peritonitis Skin: no rashes noted, warm and dry  Pertinent labs and Studies: Recent Labs    11/05/17 0458  WBC 5.9  HGB 10.4*  HCT 32.3*   BMET Recent Labs    11/05/17 0458  NA 141  K 4.3  CL 110  CO2 23  GLUCOSE 195*  BUN 5*  CREATININE 0.69  CALCIUM 8.8*   No results for input(s): LABURIN in the last 72  hours. Results for orders placed or performed during the hospital encounter of 10/31/17  MRSA PCR Screening     Status: None   Collection Time: 11/01/17  6:38 AM  Result Value Ref Range Status   MRSA by PCR NEGATIVE NEGATIVE Final    Comment:        The GeneXpert MRSA Assay (FDA approved for NASAL specimens only), is one component of a comprehensive MRSA colonization surveillance program. It is not intended to diagnose MRSA infection nor to guide or monitor treatment for MRSA infections. Performed at [facility], [place] [address], [place], [place] [address]     Imaging: No results found.

## 2017-11-07 LAB — GLUCOSE, CAPILLARY
Glucose-Capillary: 154 mg/dL — ABNORMAL HIGH (ref 70–99)
Glucose-Capillary: 146 mg/dL — ABNORMAL HIGH (ref 70–99)

## 2017-11-07 MED ORDER — INSULIN ASPART PROT & ASPART (70-30 MIX) 100 UNIT/ML ~~LOC~~ SUSP: 20.0000 [IU] | SUBCUTANEOUS | 11 refills | Status: DC

## 2017-11-07 MED ORDER — POTASSIUM CHLORIDE CRYS ER 20 MEQ PO TBCR: 10.0000 meq | EXTENDED_RELEASE_TABLET | Freq: Every day | ORAL | Status: DC

## 2017-11-07 NOTE — Discharge Summary (Signed)
Physician Discharge Summary  Patient ID: Katherine [name] MRN: [id number] DOB/AGE: 1930/04/26 84 y.o.  Admit date: 10/31/2017 Discharge date: 11/07/2017  Admission Diagnoses: Acute partial small bowel obstruction Type 2 diabetes mellitus Dehydration CAD Hypertension History of stroke History of DVT History of GI bleed with AV malformation  Discharge Diagnoses:  Principle Problem: Acute small bowel partial obstruction Active Problems: Type 2 diabetes mellitus Dehydration corrected Hypokalemia-corrected Coronary artery disease Hypertension, essential Mild protein calorie malnutrition History of DVT History of stroke History of GI bleed from AV malformation  Discharged Condition: fair  Hospital Course: This 84 year old female from assisted living facility had a right lower quadrant abdominal pain with nausea vomiting and diarrhea she denied any fever or shortness of breath.  Her a CT scan of the abdomen and pelvis was positive for partial small bowel obstruction.  She had similar episode 4 years ago as she has multiple abdominal surgeries in the past with possible bowel adhesions. She was treated with NG tube on low suction and n.p.o. for 4 days. IV fluid was given for dehydration and potassium supplementation.  Surgical consult was obtained and conservative treatment was continued.  Gradually patient was able to tolerate clear liquids followed by soft diet. She will take stool softner daily. Her insulin was decreased by 60 %.  Her activity was marginally improved. Patient was reminded to go to skilled nursing facility however she declined and was discharged home with follow-up by primary care physician in 1 week.  Consults: cardiology and general surgery  Significant Diagnostic Studies: labs: Near normal blood work except blood sugar of 143 mg. CBC near normal except hgb of 11.4 g. Lipase was normal. Troponin I was normal. UA was unremarkable except mild proteinuria.  CT Abdomen and  Pelvis was positive for partial obstruction and malrotation of small bowel. Evidence of Hysterectomy, Cholecystectomy, Appendectomy.  X-ray abdomen: Small bowel gaseous distension.  Treatments: IV hydration and NG to Low intermittent suction. Decrease in insulin dosage due to decreased oral intake.  Discharge Exam: Blood pressure 136/78, pulse 70, temperature 99.2 F (37.3 C), temperature source Oral, resp. rate 16, height 5\' 7"  (1.702 m), weight 90 kg, SpO2 99 %. General appearance: alert, cooperative and appears stated age. Head: Normocephalic, atraumatic. Eyes: Brown eyes, pink conjunctiva, corneas clear. PERRL, EOM's intact.  Neck: No adenopathy, no carotid bruit, no JVD, supple, symmetrical, trachea midline and thyroid not enlarged. Resp: Clear to auscultation bilaterally. Cardio: Regular rate and rhythm, S1, S2 normal, II/VI systolic murmur, no click, rub or gallop. GI: Soft, mild umbilical area tenderness; bowel sounds normal; no organomegaly. Extremities: No edema, cyanosis or clubbing. Skin: Warm and dry.  Neurologic: Alert and oriented X 3, normal strength and tone. Normal coordination and gait.  Disposition: Discharge disposition: 01-Home or Self Care        Allergies as of 11/07/2017      Reactions   Aspirin Hives   Blue Dyes (parenteral) Hives   Contrast Media [iodinated Diagnostic Agents] Other (See Comments)   Unknown reaction   Ibuprofen Hives   Iohexol Other (See Comments)   Unknown reaction   Shellfish-derived Products Hives      Medication List    STOP taking these medications   dicyclomine 10 MG capsule Commonly known as:  BENTYL   dorzolamide 2 % ophthalmic solution Commonly known as:  TRUSOPT   loperamide 2 MG capsule Commonly known as:  IMODIUM   pilocarpine 4 % ophthalmic solution Commonly known as:  PILOCAR   torsemide 20  MG tablet Commonly known as:  DEMADEX     TAKE these medications   amLODipine-valsartan 5-320 MG tablet Commonly  known as:  EXFORGE Take 1 tablet by mouth daily.   atorvastatin 40 MG tablet Commonly known as:  LIPITOR Take 40 mg by mouth daily.   bimatoprost 0.01 % Soln Commonly known as:  LUMIGAN Place 1 drop into both eyes at bedtime.   brimonidine 0.1 % Soln Commonly known as:  ALPHAGAN P Place 1 drop into both eyes 3 (three) times daily.   clopidogrel 75 MG tablet Commonly known as:  PLAVIX Take 1 tablet (75 mg total) by mouth daily.   ergocalciferol 50000 units capsule Commonly known as:  VITAMIN D2 Take 50,000 Units by mouth every Saturday.   fentaNYL 12 MCG/HR Commonly known as:  DURAGESIC - dosed mcg/hr Place 12.5 mcg onto the skin every 3 (three) days.   insulin aspart protamine- aspart (70-30) 100 UNIT/ML injection Commonly known as:  NOVOLOG MIX 70/30 Inject 0.2-0.45 mLs (20-45 Units total) into the skin See admin instructions. Inject 20 units subcutaneously every morning and 10 units at night What changed:  additional instructions   metoprolol tartrate 25 MG tablet Commonly known as:  LOPRESSOR Take 12.5 mg by mouth 2 (two) times daily.   nitroGLYCERIN 0.4 MG SL tablet Commonly known as:  NITROSTAT Place 1 tablet (0.4 mg total) under the tongue every 5 (five) minutes as needed for chest pain (CP or SOB). What changed:  reasons to take this   nitroGLYCERIN 0.4 mg/hr patch Commonly known as:  NITRODUR - Dosed in mg/24 hr Place 0.4 mg onto the skin daily. What changed:  Another medication with the same name was changed. Make sure you understand how and when to take each.   omeprazole 40 MG capsule Commonly known as:  PRILOSEC Take 40 mg by mouth daily.   potassium chloride SA 20 MEQ tablet Commonly known as:  K-DUR,KLOR-CON Take 0.5 tablets (10 mEq total) by mouth daily. What changed:  how much to take   sitaGLIPtin-metformin 50-500 MG tablet Commonly known as:  JANUMET Take 1 tablet by mouth daily.   ULORIC 80 MG Tabs Generic drug:  Febuxostat Take 80 mg by  mouth daily.      Follow-up Information    [name], MD. Schedule an appointment as soon as possible for a visit in 1 week(s).   Specialty:  Cardiology Why:  Call and schedule a follow up appointment for post-hospital visit Contact information: [address] [place] [address] [phone]           Signed: Birdie Riddle 11/07/2017, 10:30 AM

## 2017-11-07 NOTE — Care Management (Signed)
84 yr old female admitted with partial small bowel obstruction. Case manager spoke with patient concerning discharge plan. Patient says she has two aides that assist her at the ALF and does not want any further services. No further CM needs identified.

## 2017-11-07 NOTE — Evaluation (Addendum)
Physical Therapy Evaluation & Discharge Patient Details Name: Katherine [name] MRN: [id number] DOB: 1931-01-18 Today's Date: 11/07/2017   History of Present Illness  Pt is an 84 y.o. female admitted 10/31/17 with partial SBO. PMH includes multiple abdominal surgeries, HTN, CVA, DVT, CAD, DM.    Clinical Impression  Patient evaluated by Physical Therapy with no further acute PT needs identified. PTA, pt from ALF, ambulatory with SPC/RW, and has assist from aide for ADLs/IADLs. Today, pt ambulating with RW at supervision-level. All education has been completed and the patient has no further questions. Pt plans to return to ALF today with family and is not interested in any follow-up services. Acute PT is signing off. Thank you for this referral.    Follow Up Recommendations No PT follow up;Supervision for mobility/OOB    Equipment Recommendations  None recommended by PT    Recommendations for Other Services       Precautions / Restrictions Precautions Precautions: Fall Restrictions Weight Bearing Restrictions: No      Mobility  Bed Mobility               General bed mobility comments: Received sittng in recliner  Transfers Overall transfer level: Modified independent Equipment used: Rolling walker (2 wheeled)                Ambulation/Gait Ambulation/Gait assistance: Supervision Gait Distance (Feet): 120 Feet Assistive device: Rolling walker (2 wheeled) Gait Pattern/deviations: Step-through pattern;Decreased stride length;Trunk flexed Gait velocity: Decreased Gait velocity interpretation: 1.31 - 2.62 ft/sec, indicative of limited community ambulator General Gait Details: Slow, steady amb with RW  Stairs            Wheelchair Mobility    Modified Rankin (Stroke Patients Only)       Balance Overall balance assessment: Mild deficits observed, not formally tested                                           Pertinent Vitals/Pain Pain  Assessment: Faces Faces Pain Scale: Hurts a little bit Pain Location: Stomach Pain Descriptors / Indicators: Sore Pain Intervention(s): Monitored during session    Home Living Family/patient expects to be discharged to:: Assisted living               Home Equipment: Shower seat;Bedside commode;Cane - single point;Walker - 2 wheels;Wheelchair - power      Prior Function Level of Independence: Needs assistance   Gait / Transfers Assistance Needed: uses cane in her apartment and walker in the halls, occasional use of w/c  ADL's / Homemaking Assistance Needed: aide helps with showering, dressing and does all IADL        Hand Dominance   Dominant Hand: Right    Extremity/Trunk Assessment   Upper Extremity Assessment Upper Extremity Assessment: Overall WFL for tasks assessed    Lower Extremity Assessment Lower Extremity Assessment: Overall WFL for tasks assessed    Cervical / Trunk Assessment Cervical / Trunk Assessment: Kyphotic  Communication   Communication: No difficulties  Cognition Arousal/Alertness: Awake/alert Behavior During Therapy: WFL for tasks assessed/performed Overall Cognitive Status: Within Functional Limits for tasks assessed                                        General Comments  Exercises     Assessment/Plan    PT Assessment Patent does not need any further PT services  PT Problem List         PT Treatment Interventions      PT Goals (Current goals can be found in the Care Plan section)  Acute Rehab PT Goals PT Goal Formulation: All assessment and education complete, DC therapy    Frequency     Barriers to discharge        Co-evaluation               AM-PAC PT "6 Clicks" Daily Activity  Outcome Measure Difficulty turning over in bed (including adjusting bedclothes, sheets and blankets)?: A Little Difficulty moving from lying on back to sitting on the side of the bed? : A Little Difficulty  sitting down on and standing up from a chair with arms (e.g., wheelchair, bedside commode, etc,.)?: None Help needed moving to and from a bed to chair (including a wheelchair)?: None Help needed walking in hospital room?: A Little Help needed climbing 3-5 steps with a railing? : A Little 6 Click Score: 20    End of Session Equipment Utilized During Treatment: Gait belt Activity Tolerance: Patient tolerated treatment well Patient left: in chair;with call bell/phone within reach Nurse Communication: Mobility status PT Visit Diagnosis: Other abnormalities of gait and mobility (R26.89)    Time: 9798-9211 PT Time Calculation (min) (ACUTE ONLY): 20 min   Charges:   PT Evaluation $PT Eval Moderate Complexity: [place], PT, DPT Acute Rehabilitation Services  Pager [phone] Office [place] 11/07/2017, 3:18 PM

## 2018-01-13 ENCOUNTER — Other Ambulatory Visit: Payer: Self-pay | Admitting: Cardiology

## 2018-01-13 DIAGNOSIS — Z1231 Encounter for screening mammogram for malignant neoplasm of breast: Secondary | ICD-10-CM

## 2018-02-26 ENCOUNTER — Ambulatory Visit
Admission: RE | Admit: 2018-02-26 | Discharge: 2018-02-26 | Disposition: A | Discharge status: Home / Self Care | Payer: Medicare Other | Source: Ambulatory Visit | Attending: Cardiology | Admitting: Cardiology

## 2018-02-26 DIAGNOSIS — Z1231 Encounter for screening mammogram for malignant neoplasm of breast: Secondary | ICD-10-CM

## 2018-09-04 ENCOUNTER — Inpatient Hospital Stay (HOSPITAL_COMMUNITY): Payer: Medicare Other

## 2018-09-04 ENCOUNTER — Emergency Department (HOSPITAL_COMMUNITY): Payer: Medicare Other

## 2018-09-04 ENCOUNTER — Other Ambulatory Visit: Payer: Self-pay

## 2018-09-04 ENCOUNTER — Inpatient Hospital Stay (HOSPITAL_COMMUNITY)
Admission: EM | Admit: 2018-09-04 | Discharge: 2018-09-10 | DRG: 389 | Disposition: A | Discharge status: Home Health | Payer: Medicare Other | Attending: Cardiology | Admitting: Cardiology

## 2018-09-04 ENCOUNTER — Encounter (HOSPITAL_COMMUNITY): Payer: Self-pay | Admitting: Emergency Medicine

## 2018-09-04 DIAGNOSIS — Z86718 Personal history of other venous thrombosis and embolism: Secondary | ICD-10-CM

## 2018-09-04 DIAGNOSIS — I358 Other nonrheumatic aortic valve disorders: Secondary | ICD-10-CM | POA: Diagnosis present

## 2018-09-04 DIAGNOSIS — Z794 Long term (current) use of insulin: Secondary | ICD-10-CM

## 2018-09-04 DIAGNOSIS — E876 Hypokalemia: Secondary | ICD-10-CM | POA: Diagnosis not present

## 2018-09-04 DIAGNOSIS — K219 Gastro-esophageal reflux disease without esophagitis: Secondary | ICD-10-CM | POA: Diagnosis present

## 2018-09-04 DIAGNOSIS — Z95828 Presence of other vascular implants and grafts: Secondary | ICD-10-CM

## 2018-09-04 DIAGNOSIS — I2510 Atherosclerotic heart disease of native coronary artery without angina pectoris: Secondary | ICD-10-CM | POA: Diagnosis present

## 2018-09-04 DIAGNOSIS — N179 Acute kidney failure, unspecified: Secondary | ICD-10-CM | POA: Diagnosis present

## 2018-09-04 DIAGNOSIS — H409 Unspecified glaucoma: Secondary | ICD-10-CM | POA: Diagnosis present

## 2018-09-04 DIAGNOSIS — I872 Venous insufficiency (chronic) (peripheral): Secondary | ICD-10-CM | POA: Diagnosis present

## 2018-09-04 DIAGNOSIS — Z8249 Family history of ischemic heart disease and other diseases of the circulatory system: Secondary | ICD-10-CM

## 2018-09-04 DIAGNOSIS — I5022 Chronic systolic (congestive) heart failure: Secondary | ICD-10-CM | POA: Diagnosis present

## 2018-09-04 DIAGNOSIS — Z8673 Personal history of transient ischemic attack (TIA), and cerebral infarction without residual deficits: Secondary | ICD-10-CM | POA: Diagnosis not present

## 2018-09-04 DIAGNOSIS — E1140 Type 2 diabetes mellitus with diabetic neuropathy, unspecified: Secondary | ICD-10-CM | POA: Diagnosis present

## 2018-09-04 DIAGNOSIS — Z1159 Encounter for screening for other viral diseases: Secondary | ICD-10-CM | POA: Diagnosis not present

## 2018-09-04 DIAGNOSIS — K565 Intestinal adhesions [bands], unspecified as to partial versus complete obstruction: Secondary | ICD-10-CM | POA: Diagnosis not present

## 2018-09-04 DIAGNOSIS — E860 Dehydration: Secondary | ICD-10-CM | POA: Diagnosis present

## 2018-09-04 DIAGNOSIS — K56609 Unspecified intestinal obstruction, unspecified as to partial versus complete obstruction: Secondary | ICD-10-CM | POA: Diagnosis present

## 2018-09-04 DIAGNOSIS — Z87891 Personal history of nicotine dependence: Secondary | ICD-10-CM

## 2018-09-04 DIAGNOSIS — Z0189 Encounter for other specified special examinations: Secondary | ICD-10-CM

## 2018-09-04 DIAGNOSIS — Z4659 Encounter for fitting and adjustment of other gastrointestinal appliance and device: Secondary | ICD-10-CM

## 2018-09-04 DIAGNOSIS — D638 Anemia in other chronic diseases classified elsewhere: Secondary | ICD-10-CM | POA: Diagnosis present

## 2018-09-04 DIAGNOSIS — M1990 Unspecified osteoarthritis, unspecified site: Secondary | ICD-10-CM | POA: Diagnosis present

## 2018-09-04 DIAGNOSIS — Z7902 Long term (current) use of antithrombotics/antiplatelets: Secondary | ICD-10-CM | POA: Diagnosis not present

## 2018-09-04 DIAGNOSIS — I110 Hypertensive heart disease with heart failure: Secondary | ICD-10-CM | POA: Diagnosis present

## 2018-09-04 DIAGNOSIS — G8929 Other chronic pain: Secondary | ICD-10-CM | POA: Diagnosis present

## 2018-09-04 LAB — CBC WITH DIFFERENTIAL/PLATELET
Abs Immature Granulocytes: 0.03 10*3/uL (ref 0.00–0.07)
Basophils Absolute: 0 10*3/uL (ref 0.0–0.1)
Basophils Relative: 0 %
Eosinophils Absolute: 0 10*3/uL (ref 0.0–0.5)
Eosinophils Relative: 0 %
HCT: 41.5 % (ref 36.0–46.0)
Hemoglobin: 13.1 g/dL (ref 12.0–15.0)
Immature Granulocytes: 1 %
Lymphocytes Relative: 15 %
Lymphs Abs: 0.9 10*3/uL (ref 0.7–4.0)
MCH: 27.9 pg (ref 26.0–34.0)
MCHC: 31.6 g/dL (ref 30.0–36.0)
MCV: 88.5 fL (ref 80.0–100.0)
Monocytes Absolute: 0.3 10*3/uL (ref 0.1–1.0)
Monocytes Relative: 5 %
Neutro Abs: 4.5 10*3/uL (ref 1.7–7.7)
Neutrophils Relative %: 79 %
Platelets: 263 10*3/uL (ref 150–400)
RBC: 4.69 MIL/uL (ref 3.87–5.11)
RDW: 14 % (ref 11.5–15.5)
WBC: 5.7 10*3/uL (ref 4.0–10.5)
nRBC: 0 % (ref 0.0–0.2)

## 2018-09-04 LAB — COMPREHENSIVE METABOLIC PANEL
ALT: 14 U/L (ref 0–44)
AST: 30 U/L (ref 15–41)
Albumin: 4.3 g/dL (ref 3.5–5.0)
Alkaline Phosphatase: 82 U/L (ref 38–126)
Anion gap: 15 (ref 5–15)
BUN: 38 mg/dL — ABNORMAL HIGH (ref 8–23)
CO2: 20 mmol/L — ABNORMAL LOW (ref 22–32)
Calcium: 10 mg/dL (ref 8.9–10.3)
Chloride: 107 mmol/L (ref 98–111)
Creatinine, Ser: 1.44 mg/dL — ABNORMAL HIGH (ref 0.44–1.00)
GFR calc Af Amer: 38 mL/min — ABNORMAL LOW (ref >60)
GFR calc non Af Amer: 33 mL/min — ABNORMAL LOW (ref >60)
Glucose, Bld: 188 mg/dL — ABNORMAL HIGH (ref 70–99)
Potassium: 4.8 mmol/L (ref 3.5–5.1)
Sodium: 142 mmol/L (ref 135–145)
Total Bilirubin: 0.9 mg/dL (ref 0.3–1.2)
Total Protein: 8.3 g/dL — ABNORMAL HIGH (ref 6.5–8.1)

## 2018-09-04 LAB — SARS CORONAVIRUS 2 BY RT PCR (HOSPITAL ORDER, PERFORMED IN ~~LOC~~ HOSPITAL LAB): SARS Coronavirus 2: NEGATIVE

## 2018-09-04 LAB — MAGNESIUM: Magnesium: 2 mg/dL (ref 1.7–2.4)

## 2018-09-04 LAB — LIPASE, BLOOD: Lipase: 31 U/L (ref 11–51)

## 2018-09-04 LAB — HEMOGLOBIN A1C
Hgb A1c MFr Bld: 7 % — ABNORMAL HIGH (ref 4.8–5.6)
Mean Plasma Glucose: 154.2 mg/dL

## 2018-09-04 MED ORDER — ENOXAPARIN SODIUM 40 MG/0.4ML ~~LOC~~ SOLN
40.0000 mg | SUBCUTANEOUS | Status: DC
  Administered 2018-09-04 – 2018-09-09 (×6): 40 mg via SUBCUTANEOUS
  Filled 2018-09-04 (×6): qty 0.4

## 2018-09-04 MED ORDER — PANTOPRAZOLE SODIUM 40 MG IV SOLR
40.0000 mg | INTRAVENOUS | Status: DC
  Administered 2018-09-04 – 2018-09-09 (×6): 40 mg via INTRAVENOUS
  Filled 2018-09-04 (×6): qty 40

## 2018-09-04 MED ORDER — SODIUM CHLORIDE 0.9 % IV SOLN
INTRAVENOUS | Status: DC
  Administered 2018-09-04: 23:00:00 via INTRAVENOUS

## 2018-09-04 MED ORDER — MORPHINE SULFATE (PF) 4 MG/ML IV SOLN
4.0000 mg | Freq: Once | INTRAVENOUS | Status: AC
  Administered 2018-09-04: 4 mg via INTRAVENOUS
  Filled 2018-09-04: qty 1

## 2018-09-04 MED ORDER — SODIUM CHLORIDE 0.9 % IV BOLUS
1000.0000 mL | Freq: Once | INTRAVENOUS | Status: AC
  Administered 2018-09-04: 1000 mL via INTRAVENOUS

## 2018-09-04 MED ORDER — ONDANSETRON 4 MG PO TBDP: 4.0000 mg | ORAL_TABLET | Freq: Once | ORAL | Status: DC

## 2018-09-04 MED ORDER — DIATRIZOATE MEGLUMINE & SODIUM 66-10 % PO SOLN
90.0000 mL | Freq: Once | ORAL | Status: AC
  Administered 2018-09-05: 90 mL via NASOGASTRIC
  Filled 2018-09-04: qty 90

## 2018-09-04 MED ORDER — ONDANSETRON HCL 4 MG/2ML IJ SOLN
4.0000 mg | Freq: Once | INTRAMUSCULAR | Status: AC
  Administered 2018-09-04: 4 mg via INTRAVENOUS
  Filled 2018-09-04: qty 2

## 2018-09-04 MED ORDER — ONDANSETRON HCL 4 MG PO TABS: 4.0000 mg | ORAL_TABLET | Freq: Four times a day (QID) | ORAL | Status: DC | PRN

## 2018-09-04 MED ORDER — MORPHINE SULFATE (PF) 2 MG/ML IV SOLN
1.0000 mg | Freq: Four times a day (QID) | INTRAVENOUS | Status: DC | PRN
  Administered 2018-09-04 – 2018-09-05 (×3): 2 mg via INTRAVENOUS
  Filled 2018-09-04 (×3): qty 1

## 2018-09-04 MED ORDER — INSULIN ASPART 100 UNIT/ML ~~LOC~~ SOLN
0.0000 [IU] | Freq: Three times a day (TID) | SUBCUTANEOUS | Status: DC
  Administered 2018-09-05 – 2018-09-07 (×6): 1 [IU] via SUBCUTANEOUS
  Administered 2018-09-07 – 2018-09-08 (×3): 2 [IU] via SUBCUTANEOUS
  Administered 2018-09-08: 1 [IU] via SUBCUTANEOUS
  Administered 2018-09-09 (×2): 2 [IU] via SUBCUTANEOUS
  Administered 2018-09-10: 3 [IU] via SUBCUTANEOUS
  Administered 2018-09-10: 1 [IU] via SUBCUTANEOUS

## 2018-09-04 MED ORDER — NITROGLYCERIN 0.4 MG/HR TD PT24
0.4000 mg | MEDICATED_PATCH | Freq: Every day | TRANSDERMAL | Status: DC
  Administered 2018-09-05 – 2018-09-10 (×7): 0.4 mg via TRANSDERMAL
  Filled 2018-09-04 (×9): qty 1

## 2018-09-04 MED ORDER — ONDANSETRON HCL 4 MG/2ML IJ SOLN
4.0000 mg | Freq: Four times a day (QID) | INTRAMUSCULAR | Status: DC | PRN
  Administered 2018-09-05 – 2018-09-08 (×5): 4 mg via INTRAVENOUS
  Filled 2018-09-04 (×5): qty 2

## 2018-09-04 NOTE — ED Notes (Signed)
Iv team at bedside  

## 2018-09-04 NOTE — ED Notes (Signed)
Pt aware a urine sample is needed and will provide one when able.  

## 2018-09-04 NOTE — ED Notes (Addendum)
Assisted pt to call family with room phone

## 2018-09-04 NOTE — ED Notes (Addendum)
ED TO INPATIENT HANDOFF REPORT  ED Nurse Name and Phone #: Lorrin Goodell, RN  S Name/Age/Gender Katherine [name] 85 y.o. female Room/Bed: 019C/019C  Code Status   Code Status: Prior  Home/SNF/Other Home Patient oriented to: self, place, time and situation Is this baseline? Yes   Triage Complete: Triage complete  Chief Complaint abd pain/n/v  Triage Note Pt BIB [facility] from home, c/o nausea/vomiting/diarrhea/abdominal pain that started at 0200 this am. A&O x 4.   Allergies Allergies  Allergen Reactions  . Aspirin Hives  . Blue Dyes (Parenteral) Hives  . Contrast Media [Iodinated Diagnostic Agents] Other (See Comments)    Unknown reaction  . Ibuprofen Hives  . Iohexol Other (See Comments)    Unknown reaction  . Shellfish-Derived Products Hives    Level of Care/Admitting Diagnosis ED Disposition    ED Disposition Condition [facility] Area: [place] [[id number]]  Level of Care: Telemetry Medical [104]  Covid Evaluation: Asymptomatic Screening Protocol (No Symptoms)  Diagnosis: Small bowel obstruction [facility]) [[id number]]  Admitting Physician: [name] [[id number]]  Attending Physician: [name] [[id number]]  Estimated length of stay: 3 - 4 days  Certification:: I certify this patient will need inpatient services for at least 2 midnights  PT Class (Do Not Modify): Inpatient [101]  PT Acc Code (Do Not Modify): Private [1]       B Medical/Surgery History Past Medical History:  Diagnosis Date  . Anemia    mild  . Atrial fibrillation ([facility])   . Bowel obstruction ([place]) 2015   PSBO  . Bowel obstruction ([facility]) 10/2017  . CAD (coronary artery disease)   . Carotid artery stenosis   . CHF (congestive heart failure) ([facility])   . Chronic kidney disease    renal insufficiency - DR Florene Glen yearly  . Diabetes mellitus without complication ([facility])    type 2  . Full dentures   . History of hiatal hernia   . Hypertension   . Mobility impaired    uses walker  for ambulation  . Stroke [facility])    right sided weakness,walker  . Wears glasses    Past Surgical History:  Procedure Laterality Date  . ABDOMINAL HYSTERECTOMY    . angiodysplasia    . APPENDECTOMY    . BREAST DUCTAL SYSTEM EXCISION  03/02/2012   Procedure: EXCISION DUCTAL SYSTEM BREAST;  Surgeon: Adin Hector, MD;  Location: [facility];  Service: General;  Laterality: Left;  . BREAST EXCISIONAL BIOPSY    . BREAST SURGERY     bx rt and lt   . CATARACT EXTRACTION     left  . CHOLECYSTECTOMY    . COLONOSCOPY N/A 10/15/2012   Procedure: COLONOSCOPY;  Surgeon: Beryle Beams, MD;  Location: [facility];  Service: Endoscopy;  Laterality: N/A;  . DILATION AND CURETTAGE OF UTERUS    . DVT    . ENTEROSCOPY N/A 11/17/2014   Procedure: ENTEROSCOPY;  Surgeon: Carol Ada, MD;  Location: [facility];  Service: Endoscopy;  Laterality: N/A;  . ESOPHAGOGASTRODUODENOSCOPY N/A 09/10/2012   Procedure: ESOPHAGOGASTRODUODENOSCOPY (EGD);  Surgeon: Beryle Beams, MD;  Location: [facility];  Service: Endoscopy;  Laterality: N/A;  . EYE SURGERY     right-glaucoma  . HOT HEMOSTASIS N/A 09/10/2012   Procedure: HOT HEMOSTASIS (ARGON PLASMA COAGULATION/BICAP);  Surgeon: Beryle Beams, MD;  Location: [facility];  Service: Endoscopy;  Laterality: N/A;  . HOT HEMOSTASIS N/A 11/17/2014   Procedure: HOT HEMOSTASIS (ARGON PLASMA COAGULATION/BICAP);  Surgeon: Carol Ada, MD;  Location: [facility];  Service: Endoscopy;  Laterality: N/A;  . TUBAL LIGATION       A IV Location/Drains/Wounds Patient Lines/Drains/Airways Status   Active Line/Drains/Airways    Name:   Placement date:   Placement time:   Site:   Days:   Peripheral IV 09/04/18 Left;Anterior;Upper Arm   09/04/18    1630    Arm   less than 1   NG/OG Tube Nasogastric 14 Fr. Right nare Xray Measured external length of tube   09/04/18    1842    Right nare   less than 1          Intake/Output Last 24 hours No intake or output  data in the 24 hours ending 09/04/18 1953  Labs/Imaging Results for orders placed or performed during the hospital encounter of 09/04/18 (from the past 48 hour(s))  Comprehensive metabolic panel     Status: Abnormal   Collection Time: 09/04/18  4:30 PM  Result Value Ref Range   Sodium 142 135 - 145 mmol/L   Potassium 4.8 3.5 - 5.1 mmol/L   Chloride 107 98 - 111 mmol/L   CO2 20 (L) 22 - 32 mmol/L   Glucose, Bld 188 (H) 70 - 99 mg/dL   BUN 38 (H) 8 - 23 mg/dL   Creatinine, Ser 1.44 (H) 0.44 - 1.00 mg/dL   Calcium 10.0 8.9 - 10.3 mg/dL   Total Protein 8.3 (H) 6.5 - 8.1 g/dL   Albumin 4.3 3.5 - 5.0 g/dL   AST 30 15 - 41 U/L   ALT 14 0 - 44 U/L   Alkaline Phosphatase 82 38 - 126 U/L   Total Bilirubin 0.9 0.3 - 1.2 mg/dL   GFR calc non Af Amer 33 (L) >60 mL/min   GFR calc Af Amer 38 (L) >60 mL/min   Anion gap 15 5 - 15    Comment: Performed at [facility], [address] [place], [place] [address]  Lipase, blood     Status: None   Collection Time: 09/04/18  4:30 PM  Result Value Ref Range   Lipase 31 11 - 51 U/L    Comment: Performed at [facility] [address], [place], [place] [address]  CBC with Differential/Platelet     Status: None   Collection Time: 09/04/18  5:26 PM  Result Value Ref Range   WBC 5.7 4.0 - 10.5 K/uL   RBC 4.69 3.87 - 5.11 MIL/uL   Hemoglobin 13.1 12.0 - 15.0 g/dL   HCT 41.5 36.0 - 46.0 %   MCV 88.5 80.0 - 100.0 fL   MCH 27.9 26.0 - 34.0 pg   MCHC 31.6 30.0 - 36.0 g/dL   RDW 14.0 11.5 - 15.5 %   Platelets 263 150 - 400 K/uL   nRBC 0.0 0.0 - 0.2 %   Neutrophils Relative % 79 %   Neutro Abs 4.5 1.7 - 7.7 K/uL   Lymphocytes Relative 15 %   Lymphs Abs 0.9 0.7 - 4.0 K/uL   Monocytes Relative 5 %   Monocytes Absolute 0.3 0.1 - 1.0 K/uL   Eosinophils Relative 0 %   Eosinophils Absolute 0.0 0.0 - 0.5 K/uL   Basophils Relative 0 %   Basophils Absolute 0.0 0.0 - 0.1 K/uL   Immature Granulocytes 1 %   Abs Immature Granulocytes 0.03 0.00 -  0.07 K/uL    Comment: Performed at [facility], [address]  [place], [place] [address]   Ct Abdomen Pelvis Wo Contrast  Result Date: 09/04/2018 CLINICAL DATA:  Clinical concern for bowel obstruction. EXAM: CT ABDOMEN AND PELVIS WITHOUT CONTRAST TECHNIQUE: Multidetector CT imaging of the abdomen and pelvis was performed following the standard protocol without IV contrast. COMPARISON:  11/03/2017 abdominal radiograph. 10/31/2017 CT abdomen/pelvis. FINDINGS: Lower chest: No significant pulmonary nodules or acute consolidative airspace disease. Coronary atherosclerosis. Hepatobiliary: Normal liver size. No liver mass. Cholecystectomy. Bile ducts are stable and within normal post cholecystectomy limits. Pancreas: Normal, with no mass or duct dilation. Spleen: Normal size. No mass. Adrenals/Urinary Tract: Normal adrenals. No hydronephrosis. No renal stones. No contour deforming renal masses. Normal bladder. Stomach/Bowel: Normal non-distended stomach. There are several mildly dilated fluid-filled proximal small bowel loops in the left and central abdomen measuring up to 3.1 cm diameter. The distal small bowel is collapsed. The transition point is probably in the anterior left lower quadrant. Findings are compatible with mechanical mid small bowel obstruction. No small bowel wall thickening or pneumatosis. Appendectomy. Collapsed normal large bowel. Vascular/Lymphatic: Atherosclerotic nonaneurysmal abdominal aorta. IVC filter is in place below the level of the renal veins. No pathologically enlarged lymph nodes in the abdomen or pelvis. Reproductive: Status post hysterectomy, with no abnormal findings at the vaginal cuff. No adnexal mass. Other: No pneumoperitoneum, ascites or focal fluid collection. Musculoskeletal: No aggressive appearing focal osseous lesions. Moderate thoracolumbar spondylosis. IMPRESSION: 1. CT findings are compatible with mechanical mid small bowel obstruction with probable transition  point in the anterior left lower quadrant, probably due to adhesions. No bowel wall thickening, pneumatosis or pneumoperitoneum. 2.  Aortic Atherosclerosis (ICD10-I70.0). Electronically Signed   By: Ilona Sorrel M.D.   On: 09/04/2018 17:15    Pending Labs Unresulted Labs (From admission, onward)    Start     Ordered   09/04/18 1651  CBC with Differential  ONCE - STAT,   STAT     09/04/18 1650   09/04/18 1617  CBC with Differential  ONCE - STAT,   STAT     09/04/18 1617   09/04/18 1617  Urinalysis, Routine w reflex microscopic  Once,   STAT     09/04/18 1617   09/04/18 1617  Urine culture  ONCE - STAT,   STAT     09/04/18 1617   Signed and Held  SARS Coronavirus 2 (CEPHEID - Performed in [place] hospital lab), Hosp Order  ([employer])  Once,   R    Question:  Pre-procedural testing  Answer:  Yes   Signed and Held   Signed and Held  CBC  (enoxaparin (LOVENOX)    CrCl >/= 30 ml/min)  Once,   R    Comments: Baseline for enoxaparin therapy IF NOT ALREADY DRAWN.  Notify MD if PLT < 100 K.    Signed and Held   Signed and Held  Creatinine, serum  (enoxaparin (LOVENOX)    CrCl >/= 30 ml/min)  Once,   R    Comments: Baseline for enoxaparin therapy IF NOT ALREADY DRAWN.    Signed and Held   Signed and Held  Creatinine, serum  (enoxaparin (LOVENOX)    CrCl >/= 30 ml/min)  Weekly,   R    Comments: while on enoxaparin therapy    Signed and Held   Signed and Held  Magnesium  Once,   R     Signed and Held   Signed and Held  Urinalysis, Routine w reflex microscopic  Once,   R  Signed and Held   Signed and Held  Hemoglobin A1c  Once,   R     Signed and Held   Signed and Held  Basic metabolic panel  Tomorrow morning,   R     Signed and Held   Signed and Held  CBC  Tomorrow morning,   R     Signed and Held          Vitals/Pain Today's Vitals   09/04/18 1713 09/04/18 1730 09/04/18 1800 09/04/18 1854  BP: (!) 160/74 (!) 166/79 (!) 142/69   Pulse: 85 84 83   Resp: 18 17 15    Temp:       TempSrc:      SpO2: 99% 97% 100%   PainSc:    10-Worst pain ever    Isolation Precautions No active isolations  Medications Medications  ondansetron (ZOFRAN-ODT) disintegrating tablet 4 mg (4 mg Oral Not Given 09/04/18 1637)  sodium chloride 0.9 % bolus 1,000 mL (1,000 mLs Intravenous New Bag/Given 09/04/18 1636)  ondansetron (ZOFRAN) injection 4 mg (4 mg Intravenous Given 09/04/18 1636)  morphine 4 MG/ML injection 4 mg (4 mg Intravenous Given 09/04/18 1636)  morphine 4 MG/ML injection 4 mg (4 mg Intravenous Given 09/04/18 1855)  ondansetron (ZOFRAN) injection 4 mg (4 mg Intravenous Given 09/04/18 1855)    Mobility walks Low fall risk   Focused Assessments    R Recommendations: See Admitting Provider Note  Report given to: Claiborne Billings, RN  Additional Notes:

## 2018-09-04 NOTE — Consult Note (Signed)
Reason for Consult:sbo Referring Physician: Dr Clois Comber Katherine [name] is an 85 y.o. female.  HPI: 68 yof with mmp including dm, cad, history dvt who has multiple abdominal surgeries. She does not remember what all these surgeries are. She states she started having surgery at age 47 .  She has history of sbo managed nonoperatively and she states about four over the last year.  Yesterday she began having abd pain associated with n/v. She had a bm this am but no flatus.  The pain was aggravated by moving.  It was getting worse and was continuous.  She came to er. She was evaluated and appears to have a sbo. She had ng placed and does feel some better now. I was asked to see her.   Past Medical History:  Diagnosis Date  . Anemia    mild  . Atrial fibrillation ([place])   . Bowel obstruction ([place]) 2015   PSBO  . Bowel obstruction ([place]) 10/2017  . CAD (coronary artery disease)   . Carotid artery stenosis   . CHF (congestive heart failure) ([place])   . Chronic kidney disease    renal insufficiency - DR Florene Glen yearly  . Diabetes mellitus without complication ([place])    type 2  . Full dentures   . History of hiatal hernia   . Hypertension   . Mobility impaired    uses walker for ambulation  . Stroke [facility])    right sided weakness,walker  . Wears glasses     Past Surgical History:  Procedure Laterality Date  . ABDOMINAL HYSTERECTOMY    . angiodysplasia    . APPENDECTOMY    . BREAST DUCTAL SYSTEM EXCISION  03/02/2012   Procedure: EXCISION DUCTAL SYSTEM BREAST;  Surgeon: Adin Hector, MD;  Location: [facility];  Service: General;  Laterality: Left;  . BREAST EXCISIONAL BIOPSY    . BREAST SURGERY     bx rt and lt   . CATARACT EXTRACTION     left  . CHOLECYSTECTOMY    . COLONOSCOPY N/A 10/15/2012   Procedure: COLONOSCOPY;  Surgeon: Beryle Beams, MD;  [facility];  Service: Endoscopy;  Laterality: N/A;  . DILATION AND CURETTAGE OF UTERUS    . DVT    .  ENTEROSCOPY N/A 11/17/2014   Procedure: ENTEROSCOPY;  Surgeon: Carol Ada, MD;  [facility];  Service: Endoscopy;  Laterality: N/A;  . ESOPHAGOGASTRODUODENOSCOPY N/A 09/10/2012   Procedure: ESOPHAGOGASTRODUODENOSCOPY (EGD);  Surgeon: Beryle Beams, MD;  Location[facility];  Service: Endoscopy;  Laterality: N/A;  . EYE SURGERY     right-glaucoma  . HOT HEMOSTASIS N/A 09/10/2012   Procedure: HOT HEMOSTASIS (ARGON PLASMA COAGULATION/BICAP);  Surgeon: Beryle Beams, MD;  Location: [facility];  Service: Endoscopy;  Laterality: N/A;  . HOT HEMOSTASIS N/A 11/17/2014   Procedure: HOT HEMOSTASIS (ARGON PLASMA COAGULATION/BICAP);  Surgeon: Carol Ada, MD;  Location: [facility];  Service: Endoscopy;  Laterality: N/A;  . TUBAL LIGATION      Family History  Problem Relation Age of Onset  . Heart disease Sister   . Heart disease Brother   . Heart disease Mother   . Heart disease Father   . Heart disease Daughter     Social History:  reports that she quit smoking about 16 years ago. She has never used smokeless tobacco. She reports that she does not drink alcohol or use drugs.  Allergies:  Allergies  Allergen Reactions  . Aspirin Hives  .  Blue Dyes (Parenteral) Hives  . Contrast Media [Iodinated Diagnostic Agents] Other (See Comments)    Unknown reaction  . Ibuprofen Hives  . Iohexol Other (See Comments)    Unknown reaction  . Shellfish-Derived Products Hives    Medications: I have reviewed the patient's current medications.  Results for orders placed or performed during the hospital encounter of 09/04/18 (from the past 48 hour(s))  Comprehensive metabolic panel     Status: Abnormal   Collection Time: 09/04/18  4:30 PM  Result Value Ref Range   Sodium 142 135 - 145 mmol/L   Potassium 4.8 3.5 - 5.1 mmol/L   Chloride 107 98 - 111 mmol/L   CO2 20 (L) 22 - 32 mmol/L   Glucose, Bld 188 (H) 70 - 99 mg/dL   BUN 38 (H) 8 - 23 mg/dL   Creatinine, Ser 1.44 (H) 0.44 - 1.00  mg/dL   Calcium 10.0 8.9 - 10.3 mg/dL   Total Protein 8.3 (H) 6.5 - 8.1 g/dL   Albumin 4.3 3.5 - 5.0 g/dL   AST 30 15 - 41 U/L   ALT 14 0 - 44 U/L   Alkaline Phosphatase 82 38 - 126 U/L   Total Bilirubin 0.9 0.3 - 1.2 mg/dL   GFR calc non Af Amer 33 (L) >60 mL/min   GFR calc Af Amer 38 (L) >60 mL/min   Anion gap 15 5 - 15    Comment: Performed at [facility], [address], [place], [place] [address]  Lipase, blood     Status: None   Collection Time: 09/04/18  4:30 PM  Result Value Ref Range   Lipase 31 11 - 51 U/L    Comment: Performed at [facility] [address] [place], [place] [address]  CBC with Differential/Platelet     Status: None   Collection Time: 09/04/18  5:26 PM  Result Value Ref Range   WBC 5.7 4.0 - 10.5 K/uL   RBC 4.69 3.87 - 5.11 MIL/uL   Hemoglobin 13.1 12.0 - 15.0 g/dL   HCT 41.5 36.0 - 46.0 %   MCV 88.5 80.0 - 100.0 fL   MCH 27.9 26.0 - 34.0 pg   MCHC 31.6 30.0 - 36.0 g/dL   RDW 14.0 11.5 - 15.5 %   Platelets 263 150 - 400 K/uL   nRBC 0.0 0.0 - 0.2 %   Neutrophils Relative % 79 %   Neutro Abs 4.5 1.7 - 7.7 K/uL   Lymphocytes Relative 15 %   Lymphs Abs 0.9 0.7 - 4.0 K/uL   Monocytes Relative 5 %   Monocytes Absolute 0.3 0.1 - 1.0 K/uL   Eosinophils Relative 0 %   Eosinophils Absolute 0.0 0.0 - 0.5 K/uL   Basophils Relative 0 %   Basophils Absolute 0.0 0.0 - 0.1 K/uL   Immature Granulocytes 1 %   Abs Immature Granulocytes 0.03 0.00 - 0.07 K/uL    Comment: Performed at [facility], [address], [place], [place] [address]    Ct Abdomen Pelvis Wo Contrast  Result Date: 09/04/2018 CLINICAL DATA:  Clinical concern for bowel obstruction. EXAM: CT ABDOMEN AND PELVIS WITHOUT CONTRAST TECHNIQUE: Multidetector CT imaging of the abdomen and pelvis was performed following the standard protocol without IV contrast. COMPARISON:  11/03/2017 abdominal radiograph. 10/31/2017 CT abdomen/pelvis. FINDINGS: Lower chest: No significant pulmonary  nodules or acute consolidative airspace disease. Coronary atherosclerosis. Hepatobiliary: Normal liver size. No liver mass. Cholecystectomy. Bile ducts are stable and  within normal post cholecystectomy limits. Pancreas: Normal, with no mass or duct dilation. Spleen: Normal size. No mass. Adrenals/Urinary Tract: Normal adrenals. No hydronephrosis. No renal stones. No contour deforming renal masses. Normal bladder. Stomach/Bowel: Normal non-distended stomach. There are several mildly dilated fluid-filled proximal small bowel loops in the left and central abdomen measuring up to 3.1 cm diameter. The distal small bowel is collapsed. The transition point is probably in the anterior left lower quadrant. Findings are compatible with mechanical mid small bowel obstruction. No small bowel wall thickening or pneumatosis. Appendectomy. Collapsed normal large bowel. Vascular/Lymphatic: Atherosclerotic nonaneurysmal abdominal aorta. IVC filter is in place below the level of the renal veins. No pathologically enlarged lymph nodes in the abdomen or pelvis. Reproductive: Status post hysterectomy, with no abnormal findings at the vaginal cuff. No adnexal mass. Other: No pneumoperitoneum, ascites or focal fluid collection. Musculoskeletal: No aggressive appearing focal osseous lesions. Moderate thoracolumbar spondylosis. IMPRESSION: 1. CT findings are compatible with mechanical mid small bowel obstruction with probable transition point in the anterior left lower quadrant, probably due to adhesions. No bowel wall thickening, pneumatosis or pneumoperitoneum. 2.  Aortic Atherosclerosis (ICD10-I70.0). Electronically Signed   By: Ilona Sorrel M.D.   On: 09/04/2018 17:15   Dg Abd Portable 1v  Result Date: 09/04/2018 CLINICAL DATA:  Evaluate NG tube EXAM: PORTABLE ABDOMEN - 1 VIEW COMPARISON:  CT scan September 04, 2018 FINDINGS: The side port of the NG tube is near the GE junction with the tip in the stomach. An IVC filter is noted. No other  acute abnormalities. IMPRESSION: The side port of the NG tube is near the GE junction. Recommend advancing several cm. Electronically Signed   By: Dorise Bullion III M.D   On: 09/04/2018 21:08    Review of Systems  Gastrointestinal: Positive for abdominal pain, nausea and vomiting.  All other systems reviewed and are negative.  Blood pressure (!) 155/91, pulse 91, temperature 98.7 F (37.1 C), temperature source Oral, resp. rate 19, SpO2 100 %.  Assessment/Plan: SBO likely adhesive -she Is frustrated these keep occurring but I reviewed why surgery is not best option for her still att his point. She has no indication for urgent surgery -agree with ng tube, I have written for sbo protocol which she has done in the past as well -will follow and hopefully resolves conservatively  Rolm Bookbinder 09/04/2018, 9:33 PM

## 2018-09-04 NOTE — ED Provider Notes (Signed)
[facility] EMERGENCY DEPARTMENT Provider Note   CSN: [id number] Arrival date & time: 09/04/18  1513    History   Chief Complaint Chief Complaint  Patient presents with  . Emesis  . Abdominal Pain    HPI Katherine [name] is a 85 y.o. female.     HPI   85 year old female with a history of CHF, hypertension, diabetes, CVA, multiple abdominal surgeries with history of small bowel obstruction, presents with concern for abdominal pain, nausea and vomiting. Reports symptoms began at 2AM. Has had 3-4 episodes each of n/v and diarrhea.  No black or bloody stool.  Diffuse abdominal pain, worse on left side.  Reminds her of when she has had SBO in the past.  Cramping pain, severe.  Has not passed flatus since very early in illness and has not had recent diarrhea.  No fevers, no urinary symptoms.   Past Medical History:  Diagnosis Date  . Anemia    mild  . Atrial fibrillation ([place])   . Bowel obstruction ([place]) 2015   PSBO  . Bowel obstruction ([place]) 10/2017  . CAD (coronary artery disease)   . Carotid artery stenosis   . CHF (congestive heart failure) ([place])   . Chronic kidney disease    renal insufficiency - DR Florene Glen yearly  . Diabetes mellitus without complication ([place])    type 2  . Full dentures   . History of hiatal hernia   . Hypertension   . Mobility impaired    uses walker for ambulation  . Stroke [facility])    right sided weakness,walker  . Wears glasses     Patient Active Problem List   Diagnosis Date Noted  . Small bowel obstruction ([place]) 10/31/2017  . Abdominal pain   . Nausea and vomiting   . Acute colitis 05/30/2017  . Chest pain 12/21/2015  . ACS (acute coronary syndrome) ([place]) 12/21/2015  . Bowel obstruction ([place]) 10/15/2013  . Angioedema due to seafood allergy 09/11/2013  . Nipple discharge, bloody 01/07/2012    Past Surgical History:  Procedure Laterality Date  . ABDOMINAL HYSTERECTOMY    . angiodysplasia    . APPENDECTOMY    . BREAST  DUCTAL SYSTEM EXCISION  03/02/2012   Procedure: EXCISION DUCTAL SYSTEM BREAST;  Surgeon: Adin Hector, MD;  Location: [facility];  Service: General;  Laterality: Left;  . BREAST EXCISIONAL BIOPSY    . BREAST SURGERY     bx rt and lt   . CATARACT EXTRACTION     left  . CHOLECYSTECTOMY    . COLONOSCOPY N/A 10/15/2012   Procedure: COLONOSCOPY;  Surgeon: Beryle Beams, MD;  [facility];  Service: Endoscopy;  Laterality: N/A;  . DILATION AND CURETTAGE OF UTERUS    . DVT    . ENTEROSCOPY N/A 11/17/2014   Procedure: ENTEROSCOPY;  Surgeon: Carol Ada, MD;  [facility];  Service: Endoscopy;  Laterality: N/A;  . ESOPHAGOGASTRODUODENOSCOPY N/A 09/10/2012   Procedure: ESOPHAGOGASTRODUODENOSCOPY (EGD);  Surgeon: Beryle Beams, MD;  Location[facility];  Service: Endoscopy;  Laterality: N/A;  . EYE SURGERY     right-glaucoma  . HOT HEMOSTASIS N/A 09/10/2012   Procedure: HOT HEMOSTASIS (ARGON PLASMA COAGULATION/BICAP);  Surgeon: Beryle Beams, MD;  Location[facility];  Service: Endoscopy;  Laterality: N/A;  . HOT HEMOSTASIS N/A 11/17/2014   Procedure: HOT HEMOSTASIS (ARGON PLASMA COAGULATION/BICAP);  Surgeon: Carol Ada, MD;  Location[facility];  Service: Endoscopy;  Laterality: N/A;  . TUBAL  LIGATION       OB History   No obstetric history on file.      Home Medications    Prior to Admission medications   Medication Sig Start Date End Date Taking? Authorizing Provider  amLODipine-valsartan (EXFORGE) 5-320 MG tablet Take 1 tablet by mouth daily.  01/14/16   [provider]  atorvastatin (LIPITOR) 40 MG tablet Take 40 mg by mouth daily.    [provider]  bimatoprost (LUMIGAN) 0.01 % SOLN Place 1 drop into both eyes at bedtime.    [provider]  brimonidine (ALPHAGAN P) 0.1 % SOLN Place 1 drop into both eyes 3 (three) times daily.     [provider]  clopidogrel (PLAVIX) 75 MG tablet Take 1 tablet (75  mg total) by mouth daily. 06/04/17   [name], MD  ergocalciferol (VITAMIN D2) 50000 units capsule Take 50,000 Units by mouth every Saturday.     [provider]  insulin aspart protamine- aspart (NOVOLOG MIX 70/30) (70-30) 100 UNIT/ML injection Inject 0.2-0.45 mLs (20-45 Units total) into the skin See admin instructions. Inject 20 units subcutaneously every morning and 10 units at night 11/07/17   Dixie Dials, MD  metoprolol tartrate (LOPRESSOR) 25 MG tablet Take 12.5 mg by mouth 2 (two) times daily.    [provider]  nitroGLYCERIN (NITRODUR - DOSED IN MG/24 HR) 0.4 mg/hr patch Place 0.4 mg onto the skin daily.  01/21/16   [provider]  nitroGLYCERIN (NITROSTAT) 0.4 MG SL tablet Place 1 tablet (0.4 mg total) under the tongue every 5 (five) minutes as needed for chest pain (CP or SOB). 12/23/15   [name], MD  omeprazole (PRILOSEC) 40 MG capsule Take 40 mg by mouth daily.     [provider]  potassium chloride SA (K-DUR,KLOR-CON) 20 MEQ tablet Take 0.5 tablets (10 mEq total) by mouth daily. 11/07/17   Dixie Dials, MD  sitaGLIPtin-metformin (JANUMET) 50-500 MG tablet Take 1 tablet by mouth daily.    [provider]    Family History Family History  Problem Relation Age of Onset  . Heart disease Sister   . Heart disease Brother   . Heart disease Mother   . Heart disease Father   . Heart disease Daughter     Social History Social History   Tobacco Use  . Smoking status: Former Smoker    Quit date: 02/25/2002    Years since quitting: 16.5  . Smokeless tobacco: Never Used  Substance Use Topics  . Alcohol use: No  . Drug use: No     Allergies   Aspirin, Blue dyes (parenteral), Ibuprofen, Contrast media [iodinated diagnostic agents], Iohexol, and Shellfish-derived products   Review of Systems Review of Systems  Constitutional: Negative for fever.  HENT: Negative for sore throat.   Eyes: Negative for visual disturbance.   Respiratory: Negative for cough and shortness of breath.   Cardiovascular: Negative for chest pain.  Gastrointestinal: Positive for abdominal pain, constipation, diarrhea, nausea and vomiting.  Genitourinary: Negative for difficulty urinating and dysuria.  Musculoskeletal: Negative for back pain and neck pain.  Skin: Negative for rash.  Neurological: Negative for syncope and headaches.     Physical Exam Updated Vital Signs BP (!) 163/75 (BP Location: Left Arm)   Pulse 93   Temp 98.6 F (37 C) (Oral)   Resp 18   Ht 5\' 7"  (1.702 m)   Wt 82.3 kg   SpO2 99%   BMI 28.43 kg/m   Physical  Exam Vitals signs and nursing note reviewed.  Constitutional:      General: She is not in acute distress.    Appearance: She is well-developed. She is not diaphoretic.  HENT:     Head: Normocephalic and atraumatic.  Eyes:     Conjunctiva/sclera: Conjunctivae normal.  Neck:     Musculoskeletal: Normal range of motion.  Cardiovascular:     Rate and Rhythm: Normal rate and regular rhythm.     Heart sounds: Normal heart sounds. No murmur. No friction rub. No gallop.   Pulmonary:     Effort: Pulmonary effort is normal. No respiratory distress.     Breath sounds: Normal breath sounds. No wheezing or rales.  Abdominal:     General: There is no distension.     Palpations: Abdomen is soft.     Tenderness: There is generalized abdominal tenderness. There is no guarding.  Musculoskeletal:        General: No tenderness.  Skin:    General: Skin is warm and dry.     Findings: No erythema or rash.  Neurological:     Mental Status: She is alert and oriented to person, place, and time.      ED Treatments / Results  Labs (all labs ordered are listed, but only abnormal results are displayed) Labs Reviewed  COMPREHENSIVE METABOLIC PANEL - Abnormal; Notable for the following components:      Result Value   CO2 20 (*)    Glucose, Bld 188 (*)    BUN 38 (*)    Creatinine, Ser 1.44 (*)    Total  Protein 8.3 (*)    GFR calc non Af Amer 33 (*)    GFR calc Af Amer 38 (*)    All other components within normal limits  URINALYSIS, ROUTINE W REFLEX MICROSCOPIC - Abnormal; Notable for the following components:   Hgb urine dipstick SMALL (*)    Protein, ur 100 (*)    Leukocytes,Ua MODERATE (*)    Bacteria, UA MANY (*)    All other components within normal limits  HEMOGLOBIN A1C - Abnormal; Notable for the following components:   Hgb A1c MFr Bld 7.0 (*)    All other components within normal limits  BASIC METABOLIC PANEL - Abnormal; Notable for the following components:   Sodium 148 (*)    Potassium 3.4 (*)    Chloride 114 (*)    CO2 21 (*)    Glucose, Bld 126 (*)    BUN 31 (*)    Creatinine, Ser 1.14 (*)    GFR calc non Af Amer 44 (*)    GFR calc Af Amer 51 (*)    All other components within normal limits  SARS CORONAVIRUS 2 (HOSPITAL ORDER, [facility])  URINE CULTURE  LIPASE, BLOOD  CBC WITH DIFFERENTIAL/PLATELET  MAGNESIUM  CBC  CBC WITH DIFFERENTIAL/PLATELET  CBC WITH DIFFERENTIAL/PLATELET    EKG None  Radiology Ct Abdomen Pelvis Wo Contrast  Result Date: 09/04/2018 CLINICAL DATA:  Clinical concern for bowel obstruction. EXAM: CT ABDOMEN AND PELVIS WITHOUT CONTRAST TECHNIQUE: Multidetector CT imaging of the abdomen and pelvis was performed following the standard protocol without IV contrast. COMPARISON:  11/03/2017 abdominal radiograph. 10/31/2017 CT abdomen/pelvis. FINDINGS: Lower chest: No significant pulmonary nodules or acute consolidative airspace disease. Coronary atherosclerosis. Hepatobiliary: Normal liver size. No liver mass. Cholecystectomy. Bile ducts are stable and within normal post cholecystectomy limits. Pancreas: Normal, with no mass or duct dilation. Spleen: Normal size. No mass.  Adrenals/Urinary Tract: Normal adrenals. No hydronephrosis. No renal stones. No contour deforming renal masses. Normal bladder. Stomach/Bowel: Normal  non-distended stomach. There are several mildly dilated fluid-filled proximal small bowel loops in the left and central abdomen measuring up to 3.1 cm diameter. The distal small bowel is collapsed. The transition point is probably in the anterior left lower quadrant. Findings are compatible with mechanical mid small bowel obstruction. No small bowel wall thickening or pneumatosis. Appendectomy. Collapsed normal large bowel. Vascular/Lymphatic: Atherosclerotic nonaneurysmal abdominal aorta. IVC filter is in place below the level of the renal veins. No pathologically enlarged lymph nodes in the abdomen or pelvis. Reproductive: Status post hysterectomy, with no abnormal findings at the vaginal cuff. No adnexal mass. Other: No pneumoperitoneum, ascites or focal fluid collection. Musculoskeletal: No aggressive appearing focal osseous lesions. Moderate thoracolumbar spondylosis. IMPRESSION: 1. CT findings are compatible with mechanical mid small bowel obstruction with probable transition point in the anterior left lower quadrant, probably due to adhesions. No bowel wall thickening, pneumatosis or pneumoperitoneum. 2.  Aortic Atherosclerosis (ICD10-I70.0). Electronically Signed   By: Ilona Sorrel M.D.   On: 09/04/2018 17:15   Dg Abd Portable 1v  Result Date: 09/04/2018 CLINICAL DATA:  Evaluate NG tube EXAM: PORTABLE ABDOMEN - 1 VIEW COMPARISON:  CT scan September 04, 2018 FINDINGS: The side port of the NG tube is near the GE junction with the tip in the stomach. An IVC filter is noted. No other acute abnormalities. IMPRESSION: The side port of the NG tube is near the GE junction. Recommend advancing several cm. Electronically Signed   By: Dorise Bullion III M.D   On: 09/04/2018 21:08    Procedures Procedures (including critical care time)  Medications Ordered in ED Medications  ondansetron (ZOFRAN-ODT) disintegrating tablet 4 mg (4 mg Oral Not Given 09/04/18 1637)  nitroGLYCERIN (NITRODUR - Dosed in mg/24 hr) patch  0.4 mg (0.4 mg Transdermal Patch Applied 09/05/18 0247)  enoxaparin (LOVENOX) injection 40 mg (40 mg Subcutaneous Given 09/04/18 2349)  0.9 %  sodium chloride infusion ( Intravenous New Bag/Given 09/04/18 2310)  ondansetron (ZOFRAN) tablet 4 mg (has no administration in time range)    Or  ondansetron (ZOFRAN) injection 4 mg (has no administration in time range)  insulin aspart (novoLOG) injection 0-9 Units (1 Units Subcutaneous Given 09/05/18 0647)  pantoprazole (PROTONIX) injection 40 mg (40 mg Intravenous Given 09/04/18 2348)  morphine 2 MG/ML injection 1-2 mg (2 mg Intravenous Given 09/05/18 0538)  sodium chloride 0.9 % bolus 1,000 mL (0 mLs Intravenous Stopped 09/04/18 1800)  ondansetron (ZOFRAN) injection 4 mg (4 mg Intravenous Given 09/04/18 1636)  morphine 4 MG/ML injection 4 mg (4 mg Intravenous Given 09/04/18 1636)  morphine 4 MG/ML injection 4 mg (4 mg Intravenous Given 09/04/18 1855)  ondansetron (ZOFRAN) injection 4 mg (4 mg Intravenous Given 09/04/18 1855)  diatrizoate meglumine-sodium (GASTROGRAFIN) 66-10 % solution 90 mL (90 mLs Per NG tube Given 09/05/18 0203)     Initial Impression / Assessment and Plan / ED Course  I have reviewed the triage vital signs and the nursing notes.  Pertinent labs & imaging results that were available during my care of the patient were reviewed by me and considered in my medical decision making (see chart for details).        85yo female with history above presents with concern for abdominal pain, nausea and vomiting.  DDx includes diverticulitis, SBO, viral infection. CT shows SBO. Given pain, nausea medication, NG tube placed. Dr. Donne Hazel of surgery consulted  and Dr. Terrence Dupont to admit for further care.    Final Clinical Impressions(s) / ED Diagnoses   Final diagnoses:  Encounter for nasogastric (NG) tube placement  Small bowel obstruction ([place])  Encounter for imaging study to confirm nasogastric (NG) tube placement    ED Discharge Orders    None        Gareth Morgan, MD 09/05/18 [phone]

## 2018-09-04 NOTE — ED Notes (Signed)
Patient transported to CT 

## 2018-09-04 NOTE — ED Triage Notes (Signed)
Pt BIB [facility] from home, c/o nausea/vomiting/diarrhea/abdominal pain that started at 0200 this am. A&O x 4.

## 2018-09-04 NOTE — ED Notes (Addendum)
MD Schlossman notified to put in orders for NG placement

## 2018-09-04 NOTE — H&P (Signed)
Katherine [name] is an 85 y.o. female.   Chief Complaint: Abdominal pain associate with nausea vomiting since 2 AM HPI: Patient is 85 year old female with past medical history significant for multiple medical problems i.e. coronary artery disease, hypertension, insulin requiring diabetes mellitus, history of congestive heart failure secondary to preserved LV systolic function, history of CVA in the past, chronic venous insufficiency, degenerative joint disease, history of DVT in the past status post IVC filter in the past, history of multiple abdominal surgeries and small bowel obstruction in the past she came to the ER complaining of abdominal pain associated with nausea vomiting since 2 AM.  Patient states she did not had one BM earlier this morning but due to continued abdominal pain decided to come to ED patient had CT of the abdomen which showed small bowel obstruction.  Patient was placed on NG Gomco suction in the ED with improvement in her abdominal pain.  Patient denies any chest pain or shortness of breath.  Denies palpitation lightheadedness or syncope.  Denies PND orthopnea leg swelling.  States she gets BM every other day approximately.  Denies any fever or chills.  Denies any urinary complaints.  Past Medical History:  Diagnosis Date  . Anemia    mild  . Atrial fibrillation ([place])   . Bowel obstruction ([place]) 2015   PSBO  . Bowel obstruction ([place]) 10/2017  . CAD (coronary artery disease)   . Carotid artery stenosis   . CHF (congestive heart failure) ([place])   . Chronic kidney disease    renal insufficiency - DR Florene Glen yearly  . Diabetes mellitus without complication ([place])    type 2  . Full dentures   . History of hiatal hernia   . Hypertension   . Mobility impaired    uses walker for ambulation  . Stroke [facility])    right sided weakness,walker  . Wears glasses     Past Surgical History:  Procedure Laterality Date  . ABDOMINAL HYSTERECTOMY    . angiodysplasia    . APPENDECTOMY     . BREAST DUCTAL SYSTEM EXCISION  03/02/2012   Procedure: EXCISION DUCTAL SYSTEM BREAST;  Surgeon: Adin Hector, MD;  Location: [facility];  Service: General;  Laterality: Left;  . BREAST EXCISIONAL BIOPSY    . BREAST SURGERY     bx rt and lt   . CATARACT EXTRACTION     left  . CHOLECYSTECTOMY    . COLONOSCOPY N/A 10/15/2012   Procedure: COLONOSCOPY;  Surgeon: Beryle Beams, MD;  Location: [facility] ENDOSCOPY;  Service: Endoscopy;  Laterality: N/A;  . DILATION AND CURETTAGE OF UTERUS    . DVT    . ENTEROSCOPY N/A 11/17/2014   Procedure: ENTEROSCOPY;  Surgeon: Carol Ada, MD;  Location: [facility]OS[facility];  Service: Endoscopy;  Laterality: N/A;  . ESOPHAGOGASTRODUODENOSCOPY N/A 09/10/2012   Procedure: ESOPHAGOGASTRODUODENOSCOPY (EGD);  Surgeon: Beryle Beams, MD;  Location: [facility];  Service: Endoscopy;  Laterality: N/A;  . EYE SURGERY     right-glaucoma  . HOT HEMOSTASIS N/A 09/10/2012   Procedure: HOT HEMOSTASIS (ARGON PLASMA COAGULATION/BICAP);  Surgeon: Beryle Beams, MD;  Location: [facility];  Service: Endoscopy;  Laterality: N/A;  . HOT HEMOSTASIS N/A 11/17/2014   Procedure: HOT HEMOSTASIS (ARGON PLASMA COAGULATION/BICAP);  Surgeon: Carol Ada, MD;  Location: [facility];  Service: Endoscopy;  Laterality: N/A;  . TUBAL LIGATION      Family History  Problem Relation Age of Onset  . Heart disease Sister   .  Heart disease Brother   . Heart disease Mother   . Heart disease Father   . Heart disease Daughter    Social History:  reports that she quit smoking about 16 years ago. She has never used smokeless tobacco. She reports that she does not drink alcohol or use drugs.  Allergies:  Allergies  Allergen Reactions  . Aspirin Hives  . Blue Dyes (Parenteral) Hives  . Contrast Media [Iodinated Diagnostic Agents] Other (See Comments)    Unknown reaction  . Ibuprofen Hives  . Iohexol Other (See Comments)    Unknown reaction  . Shellfish-Derived Products Hives     (Not in a hospital admission)   Results for orders placed or performed during the hospital encounter of 09/04/18 (from the past 48 hour(s))  Comprehensive metabolic panel     Status: Abnormal   Collection Time: 09/04/18  4:30 PM  Result Value Ref Range   Sodium 142 135 - 145 mmol/L   Potassium 4.8 3.5 - 5.1 mmol/L   Chloride 107 98 - 111 mmol/L   CO2 20 (L) 22 - 32 mmol/L   Glucose, Bld 188 (H) 70 - 99 mg/dL   BUN 38 (H) 8 - 23 mg/dL   Creatinine, Ser 1.44 (H) 0.44 - 1.00 mg/dL   Calcium 10.0 8.9 - 10.3 mg/dL   Total Protein 8.3 (H) 6.5 - 8.1 g/dL   Albumin 4.3 3.5 - 5.0 g/dL   AST 30 15 - 41 U/L   ALT 14 0 - 44 U/L   Alkaline Phosphatase 82 38 - 126 U/L   Total Bilirubin 0.9 0.3 - 1.2 mg/dL   GFR calc non Af Amer 33 (L) >60 mL/min   GFR calc Af Amer 38 (L) >60 mL/min   Anion gap 15 5 - 15    Comment: Performed at [facility], [address], [place], [place] [address]  Lipase, blood     Status: None   Collection Time: 09/04/18  4:30 PM  Result Value Ref Range   Lipase 31 11 - 51 U/L    Comment: Performed at [facility] [address] [place], [place] [address]  CBC with Differential/Platelet     Status: None   Collection Time: 09/04/18  5:26 PM  Result Value Ref Range   WBC 5.7 4.0 - 10.5 K/uL   RBC 4.69 3.87 - 5.11 MIL/uL   Hemoglobin 13.1 12.0 - 15.0 g/dL   HCT 41.5 36.0 - 46.0 %   MCV 88.5 80.0 - 100.0 fL   MCH 27.9 26.0 - 34.0 pg   MCHC 31.6 30.0 - 36.0 g/dL   RDW 14.0 11.5 - 15.5 %   Platelets 263 150 - 400 K/uL   nRBC 0.0 0.0 - 0.2 %   Neutrophils Relative % 79 %   Neutro Abs 4.5 1.7 - 7.7 K/uL   Lymphocytes Relative 15 %   Lymphs Abs 0.9 0.7 - 4.0 K/uL   Monocytes Relative 5 %   Monocytes Absolute 0.3 0.1 - 1.0 K/uL   Eosinophils Relative 0 %   Eosinophils Absolute 0.0 0.0 - 0.5 K/uL   Basophils Relative 0 %   Basophils Absolute 0.0 0.0 - 0.1 K/uL   Immature Granulocytes 1 %   Abs Immature Granulocytes 0.03 0.00 - 0.07 K/uL     Comment: Performed at [facility], [address] [place], [place] [address]   Ct Abdomen Pelvis Wo Contrast  Result Date: 09/04/2018 CLINICAL DATA:  Clinical concern  for bowel obstruction. EXAM: CT ABDOMEN AND PELVIS WITHOUT CONTRAST TECHNIQUE: Multidetector CT imaging of the abdomen and pelvis was performed following the standard protocol without IV contrast. COMPARISON:  11/03/2017 abdominal radiograph. 10/31/2017 CT abdomen/pelvis. FINDINGS: Lower chest: No significant pulmonary nodules or acute consolidative airspace disease. Coronary atherosclerosis. Hepatobiliary: Normal liver size. No liver mass. Cholecystectomy. Bile ducts are stable and within normal post cholecystectomy limits. Pancreas: Normal, with no mass or duct dilation. Spleen: Normal size. No mass. Adrenals/Urinary Tract: Normal adrenals. No hydronephrosis. No renal stones. No contour deforming renal masses. Normal bladder. Stomach/Bowel: Normal non-distended stomach. There are several mildly dilated fluid-filled proximal small bowel loops in the left and central abdomen measuring up to 3.1 cm diameter. The distal small bowel is collapsed. The transition point is probably in the anterior left lower quadrant. Findings are compatible with mechanical mid small bowel obstruction. No small bowel wall thickening or pneumatosis. Appendectomy. Collapsed normal large bowel. Vascular/Lymphatic: Atherosclerotic nonaneurysmal abdominal aorta. IVC filter is in place below the level of the renal veins. No pathologically enlarged lymph nodes in the abdomen or pelvis. Reproductive: Status post hysterectomy, with no abnormal findings at the vaginal cuff. No adnexal mass. Other: No pneumoperitoneum, ascites or focal fluid collection. Musculoskeletal: No aggressive appearing focal osseous lesions. Moderate thoracolumbar spondylosis. IMPRESSION: 1. CT findings are compatible with mechanical mid small bowel obstruction with probable transition point in the  anterior left lower quadrant, probably due to adhesions. No bowel wall thickening, pneumatosis or pneumoperitoneum. 2.  Aortic Atherosclerosis (ICD10-I70.0). Electronically Signed   By: Ilona Sorrel M.D.   On: 09/04/2018 17:15    Review of Systems  Constitutional: Negative for chills and fever.  HENT: Negative for hearing loss.   Eyes: Negative for blurred vision.  Respiratory: Negative for shortness of breath.   Cardiovascular: Negative for chest pain and palpitations.  Gastrointestinal: Positive for abdominal pain, nausea and vomiting.  Genitourinary: Negative for dysuria.  Skin: Negative for rash.  Neurological: Negative for dizziness.    Blood pressure (!) 142/69, pulse 83, temperature 98.4 F (36.9 C), temperature source Oral, resp. rate 15, SpO2 100 %. Physical Exam  Constitutional: She is oriented to person, place, and time.  HENT:  Head: Normocephalic and atraumatic.  Eyes: Conjunctivae are normal. Left eye exhibits no discharge. No scleral icterus.  Neck: Normal range of motion. Neck supple. No JVD present. No tracheal deviation present. No thyromegaly present.  Cardiovascular: Normal rate and regular rhythm.  Murmur (2/6 systolic murmur noted) heard. Respiratory: [place] normal and breath sounds normal. No respiratory distress. She has no wheezes. She has no rales.  GI:  Soft, bowel sounds faint present.  Mildly distended mild generalized tenderness noted no guarding  Musculoskeletal:        General: No tenderness, deformity or edema.  Neurological: She is alert and oriented to person, place, and time.     Assessment/Plan Acute small bowel obstruction probably secondary to adhesions Hypertension Diabetes mellitus Prerenal azotemia Acute kidney injury Diabetic neuropathy Coronary artery disease Valvular heart disease History of CVA Degenerative joint disease History of DVT/IVC filter in the past GERD Chronic venous insufficiency Plan As per  orders N.p.o. Continue low Gomco suction IV fluids as per orders Surgical consult has been called by EDP  [name], MD 09/04/2018, 7:25 PM

## 2018-09-05 ENCOUNTER — Inpatient Hospital Stay (HOSPITAL_COMMUNITY): Payer: Medicare Other

## 2018-09-05 ENCOUNTER — Other Ambulatory Visit: Payer: Self-pay

## 2018-09-05 LAB — CBC
HCT: 38.1 % (ref 36.0–46.0)
Hemoglobin: 12.1 g/dL (ref 12.0–15.0)
MCH: 28.2 pg (ref 26.0–34.0)
MCHC: 31.8 g/dL (ref 30.0–36.0)
MCV: 88.8 fL (ref 80.0–100.0)
Platelets: 267 10*3/uL (ref 150–400)
RBC: 4.29 MIL/uL (ref 3.87–5.11)
RDW: 14.1 % (ref 11.5–15.5)
WBC: 5.5 10*3/uL (ref 4.0–10.5)
nRBC: 0 % (ref 0.0–0.2)

## 2018-09-05 LAB — GLUCOSE, CAPILLARY
Glucose-Capillary: 125 mg/dL — ABNORMAL HIGH (ref 70–99)
Glucose-Capillary: 135 mg/dL — ABNORMAL HIGH (ref 70–99)
Glucose-Capillary: 112 mg/dL — ABNORMAL HIGH (ref 70–99)
Glucose-Capillary: 125 mg/dL — ABNORMAL HIGH (ref 70–99)
Glucose-Capillary: 150 mg/dL — ABNORMAL HIGH (ref 70–99)

## 2018-09-05 LAB — BASIC METABOLIC PANEL
Anion gap: 13 (ref 5–15)
BUN: 31 mg/dL — ABNORMAL HIGH (ref 8–23)
CO2: 21 mmol/L — ABNORMAL LOW (ref 22–32)
Calcium: 9.6 mg/dL (ref 8.9–10.3)
Chloride: 114 mmol/L — ABNORMAL HIGH (ref 98–111)
Creatinine, Ser: 1.14 mg/dL — ABNORMAL HIGH (ref 0.44–1.00)
GFR calc Af Amer: 51 mL/min — ABNORMAL LOW (ref >60)
GFR calc non Af Amer: 44 mL/min — ABNORMAL LOW (ref >60)
Glucose, Bld: 126 mg/dL — ABNORMAL HIGH (ref 70–99)
Potassium: 3.4 mmol/L — ABNORMAL LOW (ref 3.5–5.1)
Sodium: 148 mmol/L — ABNORMAL HIGH (ref 135–145)

## 2018-09-05 LAB — URINALYSIS, ROUTINE W REFLEX MICROSCOPIC
Bilirubin Urine: NEGATIVE
Glucose, UA: NEGATIVE mg/dL
Ketones, ur: NEGATIVE mg/dL
Nitrite: NEGATIVE
Protein, ur: 100 mg/dL — AB
Specific Gravity, Urine: 1.011 (ref 1.005–1.030)
pH: 6 (ref 5.0–8.0)

## 2018-09-05 MED ORDER — KCL IN DEXTROSE-NACL 20-5-0.45 MEQ/L-%-% IV SOLN
INTRAVENOUS | Status: DC
  Administered 2018-09-05 – 2018-09-08 (×5): via INTRAVENOUS
  Filled 2018-09-05 (×7): qty 1000

## 2018-09-05 MED ORDER — MORPHINE SULFATE (PF) 2 MG/ML IV SOLN
2.0000 mg | INTRAVENOUS | Status: DC | PRN
  Administered 2018-09-05 – 2018-09-10 (×21): 2 mg via INTRAVENOUS
  Filled 2018-09-05 (×22): qty 1

## 2018-09-05 NOTE — Progress Notes (Signed)
Subjective: Appreciate surgical consult and help. Patient denies any chest pain or shortness of breath continues to have vague generalized abdominal pain.  Remains on low Gomco suction.  Repeat x-ray is pending.  Renal function improving  Objective:  Vital Signs in the last 24 hours: Temp:  [97.9 F (36.6 C)-99.5 F (37.5 C)] 97.9 F (36.6 C) (07/05 0816) Pulse Rate:  [73-98] 73 (07/05 0816) Resp:  [14-21] 16 (07/05 0816) BP: (126-173)/(65-91) 126/66 (07/05 0816) SpO2:  [97 %-100 %] 98 % (07/05 0816) Weight:  [82.1 kg-82.3 kg] 82.3 kg (07/05 0518)  Intake/Output from previous day: 07/04 0701 - 07/05 0700 In: 1601.9 [I.V.:511.9; NG/GT:90; IV Piggyback:1000] Out: 150 [Urine:150] Intake/Output from this shift: No intake/output data recorded.  Physical Exam: Neck: no adenopathy, no carotid bruit, no JVD and supple, symmetrical, trachea midline Lungs: clear to auscultation bilaterally Heart: regular rate and rhythm, S1, S2 normal and 2/6 systolic murmur noted Abdomen: Soft bowel sounds faint generalized tenderness noted no guarding Extremities: extremities normal, atraumatic, no cyanosis or edema  Lab Results: Recent Labs    09/04/18 1726 09/05/18 0348  WBC 5.7 5.5  HGB 13.1 12.1  PLT 263 267   Recent Labs    09/04/18 1630 09/05/18 0348  NA 142 148*  K 4.8 3.4*  CL 107 114*  CO2 20* 21*  GLUCOSE 188* 126*  BUN 38* 31*  CREATININE 1.44* 1.14*   No results for input(s): TROPONINI in the last 72 hours.  Invalid input(s): CK, MB Hepatic Function Panel Recent Labs    09/04/18 1630  PROT 8.3*  ALBUMIN 4.3  AST 30  ALT 14  ALKPHOS 82  BILITOT 0.9   No results for input(s): CHOL in the last 72 hours. No results for input(s): PROTIME in the last 72 hours.  Imaging: Imaging results have been reviewed and Ct Abdomen Pelvis Wo Contrast  Result Date: 09/04/2018 CLINICAL DATA:  Clinical concern for bowel obstruction. EXAM: CT ABDOMEN AND PELVIS WITHOUT CONTRAST  TECHNIQUE: Multidetector CT imaging of the abdomen and pelvis was performed following the standard protocol without IV contrast. COMPARISON:  11/03/2017 abdominal radiograph. 10/31/2017 CT abdomen/pelvis. FINDINGS: Lower chest: No significant pulmonary nodules or acute consolidative airspace disease. Coronary atherosclerosis. Hepatobiliary: Normal liver size. No liver mass. Cholecystectomy. Bile ducts are stable and within normal post cholecystectomy limits. Pancreas: Normal, with no mass or duct dilation. Spleen: Normal size. No mass. Adrenals/Urinary Tract: Normal adrenals. No hydronephrosis. No renal stones. No contour deforming renal masses. Normal bladder. Stomach/Bowel: Normal non-distended stomach. There are several mildly dilated fluid-filled proximal small bowel loops in the left and central abdomen measuring up to 3.1 cm diameter. The distal small bowel is collapsed. The transition point is probably in the anterior left lower quadrant. Findings are compatible with mechanical mid small bowel obstruction. No small bowel wall thickening or pneumatosis. Appendectomy. Collapsed normal large bowel. Vascular/Lymphatic: Atherosclerotic nonaneurysmal abdominal aorta. IVC filter is in place below the level of the renal veins. No pathologically enlarged lymph nodes in the abdomen or pelvis. Reproductive: Status post hysterectomy, with no abnormal findings at the vaginal cuff. No adnexal mass. Other: No pneumoperitoneum, ascites or focal fluid collection. Musculoskeletal: No aggressive appearing focal osseous lesions. Moderate thoracolumbar spondylosis. IMPRESSION: 1. CT findings are compatible with mechanical mid small bowel obstruction with probable transition point in the anterior left lower quadrant, probably due to adhesions. No bowel wall thickening, pneumatosis or pneumoperitoneum. 2.  Aortic Atherosclerosis (ICD10-I70.0). Electronically Signed   By: Janina Mayo.D.  On: 09/04/2018 17:15   Dg Abd Portable  1v  Result Date: 09/04/2018 CLINICAL DATA:  Evaluate NG tube EXAM: PORTABLE ABDOMEN - 1 VIEW COMPARISON:  CT scan September 04, 2018 FINDINGS: The side port of the NG tube is near the GE junction with the tip in the stomach. An IVC filter is noted. No other acute abnormalities. IMPRESSION: The side port of the NG tube is near the GE junction. Recommend advancing several cm. Electronically Signed   By: Dorise Bullion III M.D   On: 09/04/2018 21:08    Cardiac Studies:  Assessment/Plan:  Acute small bowel obstruction probably secondary to adhesions Hypertension Diabetes mellitus Prerenal azotemia Acute kidney injury Diabetic neuropathy Coronary artery disease Valvular heart disease History of CVA Degenerative joint disease History of DVT/IVC filter in the past GERD Chronic venous insufficiency Hypokalemia Plan Change IV fluids as per orders Check labs in a.m.  LOS: 1 day    [name] 09/05/2018, 9:35 AM

## 2018-09-05 NOTE — Plan of Care (Signed)

## 2018-09-05 NOTE — Progress Notes (Signed)
Patient c/o  abd pain asking for morphine pain med reported not time would page kelly,[facility] on call for [facility] ask if we can increase frequency it was not due this time.

## 2018-09-05 NOTE — Progress Notes (Signed)
[facility] on call beeped for further pain med orders.

## 2018-09-05 NOTE — Progress Notes (Signed)
Patient ID: MURANDA COYE, female   DOB: 21-Mar-1930, 85 y.o.   MRN: [id number]       Subjective: Patient moaning complaining of some abdominal pain.  purewick hooked up to suction instead of NGT.  Patient states prior surgeries including ovarian tumor, appendix, and gb  Objective: Vital signs in last 24 hours: Temp:  [97.9 F (36.6 C)-99.5 F (37.5 C)] 97.9 F (36.6 C) (07/05 0816) Pulse Rate:  [73-98] 73 (07/05 0816) Resp:  [14-21] 16 (07/05 0816) BP: (126-173)/(65-91) 126/66 (07/05 0816) SpO2:  [97 %-100 %] 98 % (07/05 0816) Weight:  [82.1 kg-82.3 kg] 82.3 kg (07/05 0518) Last BM Date: 09/04/18  Intake/Output from previous day: 07/04 0701 - 07/05 0700 In: 1601.9 [I.V.:511.9; NG/GT:90; IV Piggyback:1000] Out: 150 [Urine:150] Intake/Output this shift: Total I/O In: -  Out: 600 [Urine:600]  PE: Heart: regular Lungs: CTAB Abd: soft, mildly tender diffusely, few BS, NGT cannister hooked up to suction while I was in there.  It had been on the bedside table so her purewick could be on suction.  Lab Results:  Recent Labs    09/04/18 1726 09/05/18 0348  WBC 5.7 5.5  HGB 13.1 12.1  HCT 41.5 38.1  PLT 263 267   BMET Recent Labs    09/04/18 1630 09/05/18 0348  NA 142 148*  K 4.8 3.4*  CL 107 114*  CO2 20* 21*  GLUCOSE 188* 126*  BUN 38* 31*  CREATININE 1.44* 1.14*  CALCIUM 10.0 9.6   PT/INR No results for input(s): LABPROT, INR in the last 72 hours. CMP     Component Value Date/Time   NA 148 (H) 09/05/2018 0348   K 3.4 (L) 09/05/2018 0348   CL 114 (H) 09/05/2018 0348   CO2 21 (L) 09/05/2018 0348   GLUCOSE 126 (H) 09/05/2018 0348   BUN 31 (H) 09/05/2018 0348   CREATININE 1.14 (H) 09/05/2018 0348   CALCIUM 9.6 09/05/2018 0348   PROT 8.3 (H) 09/04/2018 1630   ALBUMIN 4.3 09/04/2018 1630   AST 30 09/04/2018 1630   ALT 14 09/04/2018 1630   ALKPHOS 82 09/04/2018 1630   BILITOT 0.9 09/04/2018 1630   GFRNONAA 44 (L) 09/05/2018 0348   GFRAA 51 (L) 09/05/2018  0348   Lipase     Component Value Date/Time   LIPASE 31 09/04/2018 1630       Studies/Results: Ct Abdomen Pelvis Wo Contrast  Result Date: 09/04/2018 CLINICAL DATA:  Clinical concern for bowel obstruction. EXAM: CT ABDOMEN AND PELVIS WITHOUT CONTRAST TECHNIQUE: Multidetector CT imaging of the abdomen and pelvis was performed following the standard protocol without IV contrast. COMPARISON:  11/03/2017 abdominal radiograph. 10/31/2017 CT abdomen/pelvis. FINDINGS: Lower chest: No significant pulmonary nodules or acute consolidative airspace disease. Coronary atherosclerosis. Hepatobiliary: Normal liver size. No liver mass. Cholecystectomy. Bile ducts are stable and within normal post cholecystectomy limits. Pancreas: Normal, with no mass or duct dilation. Spleen: Normal size. No mass. Adrenals/Urinary Tract: Normal adrenals. No hydronephrosis. No renal stones. No contour deforming renal masses. Normal bladder. Stomach/Bowel: Normal non-distended stomach. There are several mildly dilated fluid-filled proximal small bowel loops in the left and central abdomen measuring up to 3.1 cm diameter. The distal small bowel is collapsed. The transition point is probably in the anterior left lower quadrant. Findings are compatible with mechanical mid small bowel obstruction. No small bowel wall thickening or pneumatosis. Appendectomy. Collapsed normal large bowel. Vascular/Lymphatic: Atherosclerotic nonaneurysmal abdominal aorta. IVC filter is in place below the level of the renal veins. No  pathologically enlarged lymph nodes in the abdomen or pelvis. Reproductive: Status post hysterectomy, with no abnormal findings at the vaginal cuff. No adnexal mass. Other: No pneumoperitoneum, ascites or focal fluid collection. Musculoskeletal: No aggressive appearing focal osseous lesions. Moderate thoracolumbar spondylosis. IMPRESSION: 1. CT findings are compatible with mechanical mid small bowel obstruction with probable  transition point in the anterior left lower quadrant, probably due to adhesions. No bowel wall thickening, pneumatosis or pneumoperitoneum. 2.  Aortic Atherosclerosis (ICD10-I70.0). Electronically Signed   By: Ilona Sorrel M.D.   On: 09/04/2018 17:15   Dg Abd Portable 1v  Result Date: 09/04/2018 CLINICAL DATA:  Evaluate NG tube EXAM: PORTABLE ABDOMEN - 1 VIEW COMPARISON:  CT scan September 04, 2018 FINDINGS: The side port of the NG tube is near the GE junction with the tip in the stomach. An IVC filter is noted. No other acute abnormalities. IMPRESSION: The side port of the NG tube is near the GE junction. Recommend advancing several cm. Electronically Signed   By: Dorise Bullion III M.D   On: 09/04/2018 21:08    Anti-infectives: Anti-infectives (From admission, onward)   None       Assessment/Plan  SBO -NGT reinserted at bedside given it was almost completely out.   -NGT returned to suction and informed nurse this is way more important that her purewick and NGT should not be taken off of suction.  This also should be LIWS around 90-100. -await 8-hr delay films -may mobilize to get up to void and walk in the halls. -will follow  FEN - NPO/NGT VTE - Lovenox ID - none currently   LOS: 1 day    Henreitta Cea , [facility] 09/05/2018, 11:07 AM Pager: [phone]

## 2018-09-06 ENCOUNTER — Inpatient Hospital Stay (HOSPITAL_COMMUNITY): Payer: Medicare Other

## 2018-09-06 LAB — BASIC METABOLIC PANEL
Anion gap: 10 (ref 5–15)
BUN: 17 mg/dL (ref 8–23)
CO2: 23 mmol/L (ref 22–32)
Calcium: 9.2 mg/dL (ref 8.9–10.3)
Chloride: 119 mmol/L — ABNORMAL HIGH (ref 98–111)
Creatinine, Ser: 0.94 mg/dL (ref 0.44–1.00)
GFR calc Af Amer: >60 mL/min (ref >60)
GFR calc non Af Amer: 55 mL/min — ABNORMAL LOW (ref >60)
Glucose, Bld: 164 mg/dL — ABNORMAL HIGH (ref 70–99)
Potassium: 3.6 mmol/L (ref 3.5–5.1)
Sodium: 152 mmol/L — ABNORMAL HIGH (ref 135–145)

## 2018-09-06 LAB — GLUCOSE, CAPILLARY
Glucose-Capillary: 142 mg/dL — ABNORMAL HIGH (ref 70–99)
Glucose-Capillary: 134 mg/dL — ABNORMAL HIGH (ref 70–99)
Glucose-Capillary: 129 mg/dL — ABNORMAL HIGH (ref 70–99)
Glucose-Capillary: 126 mg/dL — ABNORMAL HIGH (ref 70–99)

## 2018-09-06 LAB — CBC
HCT: 34 % — ABNORMAL LOW (ref 36.0–46.0)
Hemoglobin: 10.6 g/dL — ABNORMAL LOW (ref 12.0–15.0)
MCH: 27.9 pg (ref 26.0–34.0)
MCHC: 31.2 g/dL (ref 30.0–36.0)
MCV: 89.5 fL (ref 80.0–100.0)
Platelets: 242 10*3/uL (ref 150–400)
RBC: 3.8 MIL/uL — ABNORMAL LOW (ref 3.87–5.11)
RDW: 14.3 % (ref 11.5–15.5)
WBC: 5.3 10*3/uL (ref 4.0–10.5)
nRBC: 0 % (ref 0.0–0.2)

## 2018-09-06 NOTE — Progress Notes (Signed)
Subjective:  Complains of generalized abdominal pain no BM yet.  Denies any chest pain or shortness of breath denies urinary complaints  Objective:  Vital Signs in the last 24 hours: Temp:  [98 F (36.7 C)-98.6 F (37 C)] 98 F (36.7 C) (07/06 0639) Pulse Rate:  [73-77] 77 (07/06 0639) Resp:  [18-20] 18 (07/05 2003) BP: (126-150)/(59-71) 126/59 (07/06 0639) SpO2:  [100 %] 100 % (07/06 0639) Weight:  [82 kg] 82 kg (07/06 0639)  Intake/Output from previous day: 07/05 0701 - 07/06 0700 In: 1193.6 [I.V.:1193.6] Out: 2050 [Urine:1550; Emesis/NG output:500] Intake/Output from this shift: Total I/O In: -  Out: 450 [Urine:150; Emesis/NG output:300]  Physical Exam: Neck: no adenopathy, no carotid bruit, no JVD and supple, symmetrical, trachea midline Lungs: clear to auscultation bilaterally Heart: regular rate and rhythm, S1, S2 normal and 2/6 systolic murmur noted Abdomen: Soft bowel sounds present generalized tenderness noted no guarding Extremities: extremities normal, atraumatic, no cyanosis or edema  Lab Results: Recent Labs    09/05/18 0348 09/06/18 0639  WBC 5.5 5.3  HGB 12.1 10.6*  PLT 267 242   Recent Labs    09/05/18 0348 09/06/18 0639  NA 148* 152*  K 3.4* 3.6  CL 114* 119*  CO2 21* 23  GLUCOSE 126* 164*  BUN 31* 17  CREATININE 1.14* 0.94   No results for input(s): TROPONINI in the last 72 hours.  Invalid input(s): CK, MB Hepatic Function Panel Recent Labs    09/04/18 1630  PROT 8.3*  ALBUMIN 4.3  AST 30  ALT 14  ALKPHOS 82  BILITOT 0.9   No results for input(s): CHOL in the last 72 hours. No results for input(s): PROTIME in the last 72 hours.  Imaging: Imaging results have been reviewed and Ct Abdomen Pelvis Wo Contrast  Result Date: 09/04/2018 CLINICAL DATA:  Clinical concern for bowel obstruction. EXAM: CT ABDOMEN AND PELVIS WITHOUT CONTRAST TECHNIQUE: Multidetector CT imaging of the abdomen and pelvis was performed following the standard  protocol without IV contrast. COMPARISON:  11/03/2017 abdominal radiograph. 10/31/2017 CT abdomen/pelvis. FINDINGS: Lower chest: No significant pulmonary nodules or acute consolidative airspace disease. Coronary atherosclerosis. Hepatobiliary: Normal liver size. No liver mass. Cholecystectomy. Bile ducts are stable and within normal post cholecystectomy limits. Pancreas: Normal, with no mass or duct dilation. Spleen: Normal size. No mass. Adrenals/Urinary Tract: Normal adrenals. No hydronephrosis. No renal stones. No contour deforming renal masses. Normal bladder. Stomach/Bowel: Normal non-distended stomach. There are several mildly dilated fluid-filled proximal small bowel loops in the left and central abdomen measuring up to 3.1 cm diameter. The distal small bowel is collapsed. The transition point is probably in the anterior left lower quadrant. Findings are compatible with mechanical mid small bowel obstruction. No small bowel wall thickening or pneumatosis. Appendectomy. Collapsed normal large bowel. Vascular/Lymphatic: Atherosclerotic nonaneurysmal abdominal aorta. IVC filter is in place below the level of the renal veins. No pathologically enlarged lymph nodes in the abdomen or pelvis. Reproductive: Status post hysterectomy, with no abnormal findings at the vaginal cuff. No adnexal mass. Other: No pneumoperitoneum, ascites or focal fluid collection. Musculoskeletal: No aggressive appearing focal osseous lesions. Moderate thoracolumbar spondylosis. IMPRESSION: 1. CT findings are compatible with mechanical mid small bowel obstruction with probable transition point in the anterior left lower quadrant, probably due to adhesions. No bowel wall thickening, pneumatosis or pneumoperitoneum. 2.  Aortic Atherosclerosis (ICD10-I70.0). Electronically Signed   By: Ilona Sorrel M.D.   On: 09/04/2018 17:15   Dg Abd 1 View  Result Date:  09/06/2018 CLINICAL DATA:  Small bowel obstruction EXAM: ABDOMEN - 1 VIEW COMPARISON:   09/05/2018 FINDINGS: Nasogastric tube with the tip at the esophagogastric junction. Recommend advancing the nasogastric tube 10 cm. Oral contrast material has transited into the colon and to the rectosigmoid junction. There is no bowel dilatation to suggest obstruction. There is no evidence of pneumoperitoneum, portal venous gas or pneumatosis. There are no pathologic calcifications along the expected course of the ureters. The osseous structures are unremarkable. IMPRESSION: 1. No evidence of obstruction. 2. Nasogastric tube with the tip at the esophagogastric junction. Recommend advancing the nasogastric tube 10 cm. Electronically Signed   By: Kathreen Devoid   On: 09/06/2018 10:01   Dg Abd Portable 1v-small Bowel Obstruction Protocol-initial, 8 Hr Delay  Result Date: 09/05/2018 CLINICAL DATA:  Small-bowel obstruction, 8 hour delayed film. EXAM: PORTABLE ABDOMEN - 1 VIEW COMPARISON:  None. FINDINGS: The NG tube terminates below today's film. The IVC filter is stable. Contrast is seen in the distal small bowel and throughout the colon. A single prominent loop of small bowel remains in the left upper quadrant measuring 3.8 cm. No other acute abnormalities. IMPRESSION: 1. The NG tube terminates above today's film. 2. Contrast has traversed the small bowel and is now seen in the distal small bowel and throughout the colon. A single mildly dilated loop of small bowel remains in the left upper to mid abdomen. Electronically Signed   By: Dorise Bullion III M.D   On: 09/05/2018 11:15   Dg Abd Portable 1v  Result Date: 09/04/2018 CLINICAL DATA:  Evaluate NG tube EXAM: PORTABLE ABDOMEN - 1 VIEW COMPARISON:  CT scan September 04, 2018 FINDINGS: The side port of the NG tube is near the GE junction with the tip in the stomach. An IVC filter is noted. No other acute abnormalities. IMPRESSION: The side port of the NG tube is near the GE junction. Recommend advancing several cm. Electronically Signed   By: Dorise Bullion III M.D    On: 09/04/2018 21:08    Cardiac Studies:  Assessment/Plan:  Resolving acute small bowel obstruction probably secondary to adhesions Hypertension Diabetes mellitus Status post prerenal azotemia Status post acute kidney injury Diabetic neuropathy Coronary artery disease Valvular heart disease History of CVA Degenerative joint disease History of DVT/IVC filter in the past Plan Out of bed as tolerated OT PT consult Ambulate as tolerated  LOS: 2 days    [name] 09/06/2018, 11:43 AM

## 2018-09-06 NOTE — Evaluation (Signed)
Physical Therapy Evaluation Patient Details Name: Katherine [name] MRN: [id number] DOB: May 18, 1930 Today's Date: 09/06/2018   History of Present Illness  Patient is a 85 y/o female who presents with N/V and found to have a SBO. PMH includes multiple surgeries, HTN, CVA, DVT, CAD, DM, CKD, CHF.  Clinical Impression  Patient presents with generalized weakness, pain, impaired balance and impaired mobility s/p above. Pt lives alone in an independent living apt and has an aide assist with ADLs a few hours per day. Uses RW vs SPC for mobility. Today, pt requires Min A for transfers and gait with use of RW for support. Reports feeling weak and hurting. Eager to get home when ready. Will follow acutely to maximize independence and mobility prior to return home.    Follow Up Recommendations Home health PT;Supervision for mobility/OOB    Equipment Recommendations  None recommended by PT    Recommendations for Other Services       Precautions / Restrictions Precautions Precautions: Fall Restrictions Weight Bearing Restrictions: No      Mobility  Bed Mobility Overal bed mobility: Needs Assistance Bed Mobility: Supine to Sit;Sit to Supine     Supine to sit: Min assist;HOB elevated Sit to supine: Min guard;HOB elevated   General bed mobility comments: Min A to elevate trunk to get to EOB.  Transfers Overall transfer level: Needs assistance Equipment used: Rolling walker (2 wheeled) Transfers: Sit to/from [facility] Sit to Stand: Min assist Stand pivot transfers: Min guard       General transfer comment: Assist to power to standing with cues for hand placement/technique. Stood from EOB x1, SPT bed to [facility] with close Min guard due to urgency.  Ambulation/Gait Ambulation/Gait assistance: Min assist Gait Distance (Feet): 25 Feet Assistive device: Rolling walker (2 wheeled) Gait Pattern/deviations: Step-through pattern;Trunk flexed;Decreased stride length Gait velocity:  decreased   General Gait Details: Slow, mildly unsteady gait with RW for support; Feels weak all over.  Stairs            Wheelchair Mobility    Modified Rankin (Stroke Patients Only)       Balance Overall balance assessment: Needs assistance Sitting-balance support: Feet supported;No upper extremity supported Sitting balance-Leahy Scale: Good Sitting balance - Comments: Able to perform pericare with close Min guard.   Standing balance support: During functional activity Standing balance-Leahy Scale: Poor Standing balance comment: Requires UE support in standing.                             Pertinent Vitals/Pain Pain Assessment: Faces Faces Pain Scale: Hurts a little bit Pain Location: abdomen Pain Descriptors / Indicators: Aching;Discomfort Pain Intervention(s): Monitored during session;Repositioned    Home Living Family/patient expects to be discharged to:: Private residence Living Arrangements: Alone Available Help at Discharge: (3-4 hrs daily) Type of Home: Independent living facility Home Access: Elevator     Home Layout: One level Home Equipment: Shower seat;Bedside commode;Cane - single point;Walker - 2 wheels;Wheelchair - power      Prior Function Level of Independence: Needs assistance   Gait / Transfers Assistance Needed: uses cane in her apartment and walker in the halls, occasional use of w/c  ADL's / Homemaking Assistance Needed: aide helps with showering, dressing and does all IADL        Hand Dominance   Dominant Hand: Right    Extremity/Trunk Assessment   Upper Extremity Assessment Upper Extremity Assessment: Defer to OT evaluation  Lower Extremity Assessment Lower Extremity Assessment: Generalized weakness    Cervical / Trunk Assessment Cervical / Trunk Assessment: Kyphotic  Communication   Communication: No difficulties  Cognition Arousal/Alertness: Awake/alert Behavior During Therapy: WFL for tasks  assessed/performed Overall Cognitive Status: Within Functional Limits for tasks assessed                                 General Comments: for basic mobility tasks      General Comments General comments (skin integrity, edema, etc.): VSS throughout.    Exercises     Assessment/Plan    PT Assessment Patient needs continued PT services  PT Problem List Decreased strength;Decreased mobility;Pain;Decreased balance;Decreased activity tolerance       PT Treatment Interventions Gait training;Therapeutic activities;Therapeutic exercise;Patient/family education;Balance training;Functional mobility training    PT Goals (Current goals can be found in the Care Plan section)  Acute Rehab PT Goals Patient Stated Goal: to make these SBO's stop coming PT Goal Formulation: With patient Time For Goal Achievement: 09/20/18 Potential to Achieve Goals: Fair    Frequency Min 3X/week   Barriers to discharge Decreased caregiver support lives alone but has an Engineer, production    Co-evaluation               AM-PAC PT "6 Clicks" Mobility  Outcome Measure Help needed turning from your back to your side while in a flat bed without using bedrails?: A Little Help needed moving from lying on your back to sitting on the side of a flat bed without using bedrails?: A Lot Help needed moving to and from a bed to a chair (including a wheelchair)?: A Little Help needed standing up from a chair using your arms (e.g., wheelchair or bedside chair)?: A Little Help needed to walk in hospital room?: A Little Help needed climbing 3-5 steps with a railing? : A Lot 6 Click Score: 16    End of Session Equipment Utilized During Treatment: Gait belt Activity Tolerance: Patient tolerated treatment well Patient left: in bed;with call bell/phone within reach;with bed alarm set Nurse Communication: Mobility status PT Visit Diagnosis: Pain;Unsteadiness on feet (R26.81);Muscle weakness (generalized)  (M62.81);Difficulty in walking, not elsewhere classified (R26.2) Pain - part of body: (abdomen)    Time: 1610-9604 PT Time Calculation (min) (ACUTE ONLY): 23 min   Charges:   PT Evaluation $PT Eval Moderate Complexity: 1 Mod PT Treatments $Gait Training: 8-22 mins        Wray Kearns, PT, DPT Acute Rehabilitation Services Pager [phone] Office [place] 09/06/2018, 3:24 PM

## 2018-09-06 NOTE — Plan of Care (Signed)

## 2018-09-06 NOTE — Plan of Care (Signed)
  Problem: Clinical Measurements: Goal: Respiratory complications will improve Outcome: Progressing   

## 2018-09-06 NOTE — Progress Notes (Addendum)
Patient ID: Katherine [name], female   DOB: 12-18-1930, 85 y.o.   MRN: [id number]       Subjective: Feels a little better this morning. Of note, has chronic abdominal pain. No bowel function.   Objective: Vital signs in last 24 hours: Temp:  [98 F (36.7 C)-98.6 F (37 C)] 98 F (36.7 C) (07/06 0639) Pulse Rate:  [73-77] 77 (07/06 0639) Resp:  [18-20] 18 (07/05 2003) BP: (126-150)/(59-71) 126/59 (07/06 0639) SpO2:  [100 %] 100 % (07/06 0639) Weight:  [82 kg] 82 kg (07/06 0639) Last BM Date: 09/04/18  Intake/Output from previous day: 07/05 0701 - 07/06 0700 In: 1193.6 [I.V.:1193.6] Out: 2050 [Urine:1550; Emesis/NG output:500] Intake/Output this shift: Total I/O In: -  Out: 150 [Urine:150]  PE: Heart: regular Lungs: CTAB Abd: soft, mildly tender diffusely, not distended.  fentanyl patch on RLQ. Multiple surgical scars. NG output dark red  Lab Results:  Recent Labs    09/05/18 0348 09/06/18 0639  WBC 5.5 5.3  HGB 12.1 10.6*  HCT 38.1 34.0*  PLT 267 242   BMET Recent Labs    09/05/18 0348 09/06/18 0639  NA 148* 152*  K 3.4* 3.6  CL 114* 119*  CO2 21* 23  GLUCOSE 126* 164*  BUN 31* 17  CREATININE 1.14* 0.94  CALCIUM 9.6 9.2   PT/INR No results for input(s): LABPROT, INR in the last 72 hours. CMP     Component Value Date/Time   NA 152 (H) 09/06/2018 0639   K 3.6 09/06/2018 0639   CL 119 (H) 09/06/2018 0639   CO2 23 09/06/2018 0639   GLUCOSE 164 (H) 09/06/2018 0639   BUN 17 09/06/2018 0639   CREATININE 0.94 09/06/2018 0639   CALCIUM 9.2 09/06/2018 0639   PROT 8.3 (H) 09/04/2018 1630   ALBUMIN 4.3 09/04/2018 1630   AST 30 09/04/2018 1630   ALT 14 09/04/2018 1630   ALKPHOS 82 09/04/2018 1630   BILITOT 0.9 09/04/2018 1630   GFRNONAA 55 (L) 09/06/2018 0639   GFRAA >60 09/06/2018 0639   Lipase     Component Value Date/Time   LIPASE 31 09/04/2018 1630       Studies/Results: Ct Abdomen Pelvis Wo Contrast  Result Date: 09/04/2018 CLINICAL DATA:   Clinical concern for bowel obstruction. EXAM: CT ABDOMEN AND PELVIS WITHOUT CONTRAST TECHNIQUE: Multidetector CT imaging of the abdomen and pelvis was performed following the standard protocol without IV contrast. COMPARISON:  11/03/2017 abdominal radiograph. 10/31/2017 CT abdomen/pelvis. FINDINGS: Lower chest: No significant pulmonary nodules or acute consolidative airspace disease. Coronary atherosclerosis. Hepatobiliary: Normal liver size. No liver mass. Cholecystectomy. Bile ducts are stable and within normal post cholecystectomy limits. Pancreas: Normal, with no mass or duct dilation. Spleen: Normal size. No mass. Adrenals/Urinary Tract: Normal adrenals. No hydronephrosis. No renal stones. No contour deforming renal masses. Normal bladder. Stomach/Bowel: Normal non-distended stomach. There are several mildly dilated fluid-filled proximal small bowel loops in the left and central abdomen measuring up to 3.1 cm diameter. The distal small bowel is collapsed. The transition point is probably in the anterior left lower quadrant. Findings are compatible with mechanical mid small bowel obstruction. No small bowel wall thickening or pneumatosis. Appendectomy. Collapsed normal large bowel. Vascular/Lymphatic: Atherosclerotic nonaneurysmal abdominal aorta. IVC filter is in place below the level of the renal veins. No pathologically enlarged lymph nodes in the abdomen or pelvis. Reproductive: Status post hysterectomy, with no abnormal findings at the vaginal cuff. No adnexal mass. Other: No pneumoperitoneum, ascites or focal fluid collection. Musculoskeletal:  No aggressive appearing focal osseous lesions. Moderate thoracolumbar spondylosis. IMPRESSION: 1. CT findings are compatible with mechanical mid small bowel obstruction with probable transition point in the anterior left lower quadrant, probably due to adhesions. No bowel wall thickening, pneumatosis or pneumoperitoneum. 2.  Aortic Atherosclerosis (ICD10-I70.0).  Electronically Signed   By: Ilona Sorrel M.D.   On: 09/04/2018 17:15   Dg Abd Portable 1v-small Bowel Obstruction Protocol-initial, 8 Hr Delay  Result Date: 09/05/2018 CLINICAL DATA:  Small-bowel obstruction, 8 hour delayed film. EXAM: PORTABLE ABDOMEN - 1 VIEW COMPARISON:  None. FINDINGS: The NG tube terminates below today's film. The IVC filter is stable. Contrast is seen in the distal small bowel and throughout the colon. A single prominent loop of small bowel remains in the left upper quadrant measuring 3.8 cm. No other acute abnormalities. IMPRESSION: 1. The NG tube terminates above today's film. 2. Contrast has traversed the small bowel and is now seen in the distal small bowel and throughout the colon. A single mildly dilated loop of small bowel remains in the left upper to mid abdomen. Electronically Signed   By: Dorise Bullion III M.D   On: 09/05/2018 11:15   Dg Abd Portable 1v  Result Date: 09/04/2018 CLINICAL DATA:  Evaluate NG tube EXAM: PORTABLE ABDOMEN - 1 VIEW COMPARISON:  CT scan September 04, 2018 FINDINGS: The side port of the NG tube is near the GE junction with the tip in the stomach. An IVC filter is noted. No other acute abnormalities. IMPRESSION: The side port of the NG tube is near the GE junction. Recommend advancing several cm. Electronically Signed   By: Dorise Bullion III M.D   On: 09/04/2018 21:08    Anti-infectives: Anti-infectives (From admission, onward)   None       Assessment/Plan  SBO -8-hr delay films show transit of contrast into colon, one persistently prominent LUQ sb loop. Her pain is in the rlq and mid-abdomen. -may mobilize to get up to void and walk in the halls. -will repeat plain film this morning  FEN - NPO/NGT VTE - Lovenox ID - none currently   LOS: 2 days    Clovis Riley , MD [facility] Surgery 09/06/2018, 9:01 AM

## 2018-09-07 LAB — GLUCOSE, CAPILLARY
Glucose-Capillary: 151 mg/dL — ABNORMAL HIGH (ref 70–99)
Glucose-Capillary: 155 mg/dL — ABNORMAL HIGH (ref 70–99)
Glucose-Capillary: 137 mg/dL — ABNORMAL HIGH (ref 70–99)
Glucose-Capillary: 100 mg/dL — ABNORMAL HIGH (ref 70–99)

## 2018-09-07 MED ORDER — BISACODYL 10 MG RE SUPP
10.0000 mg | Freq: Once | RECTAL | Status: AC
  Administered 2018-09-07: 10 mg via RECTAL
  Filled 2018-09-07: qty 1

## 2018-09-07 NOTE — Progress Notes (Signed)
Subjective:  Patient denies any chest pain or shortness of breath.  States now passing gas but no BM yet.  Abdominal pain slightly improved started on clear liquids Objective:  Vital Signs in the last 24 hours: Temp:  [98.1 F (36.7 C)-99.6 F (37.6 C)] 99.6 F (37.6 C) (07/07 0754) Pulse Rate:  [67-73] 72 (07/07 0754) Resp:  [18-20] 20 (07/07 0749) BP: (116-144)/(55-93) 116/55 (07/07 0749) SpO2:  [97 %-100 %] 100 % (07/07 0754) Weight:  [82.6 kg] 82.6 kg (07/07 0533)  Intake/Output from previous day: 07/06 0701 - 07/07 0700 In: 1746.7 [P.O.:360; I.V.:1386.7] Out: 2600 [Urine:2300; Emesis/NG output:300] Intake/Output from this shift: No intake/output data recorded.  Physical Exam: Neck: no adenopathy, no carotid bruit, no JVD and supple, symmetrical, trachea midline Lungs: clear to auscultation bilaterally Heart: regular rate and rhythm, S1, S2 normal and 2/6 systolic murmur noted Abdomen: Soft faint bowel sounds present generalized mild tenderness no guarding Extremities: extremities normal, atraumatic, no cyanosis or edema  Lab Results: Recent Labs    09/05/18 0348 09/06/18 0639  WBC 5.5 5.3  HGB 12.1 10.6*  PLT 267 242   Recent Labs    09/05/18 0348 09/06/18 0639  NA 148* 152*  K 3.4* 3.6  CL 114* 119*  CO2 21* 23  GLUCOSE 126* 164*  BUN 31* 17  CREATININE 1.14* 0.94   No results for input(s): TROPONINI in the last 72 hours.  Invalid input(s): CK, MB Hepatic Function Panel Recent Labs    09/04/18 1630  PROT 8.3*  ALBUMIN 4.3  AST 30  ALT 14  ALKPHOS 82  BILITOT 0.9   No results for input(s): CHOL in the last 72 hours. No results for input(s): PROTIME in the last 72 hours.  Imaging: Imaging results have been reviewed and Dg Abd 1 View  Result Date: 09/06/2018 CLINICAL DATA:  Small bowel obstruction EXAM: ABDOMEN - 1 VIEW COMPARISON:  09/05/2018 FINDINGS: Nasogastric tube with the tip at the esophagogastric junction. Recommend advancing the  nasogastric tube 10 cm. Oral contrast material has transited into the colon and to the rectosigmoid junction. There is no bowel dilatation to suggest obstruction. There is no evidence of pneumoperitoneum, portal venous gas or pneumatosis. There are no pathologic calcifications along the expected course of the ureters. The osseous structures are unremarkable. IMPRESSION: 1. No evidence of obstruction. 2. Nasogastric tube with the tip at the esophagogastric junction. Recommend advancing the nasogastric tube 10 cm. Electronically Signed   By: Kathreen Devoid   On: 09/06/2018 10:01   Dg Abd Portable 1v-small Bowel Obstruction Protocol-initial, 8 Hr Delay  Result Date: 09/05/2018 CLINICAL DATA:  Small-bowel obstruction, 8 hour delayed film. EXAM: PORTABLE ABDOMEN - 1 VIEW COMPARISON:  None. FINDINGS: The NG tube terminates below today's film. The IVC filter is stable. Contrast is seen in the distal small bowel and throughout the colon. A single prominent loop of small bowel remains in the left upper quadrant measuring 3.8 cm. No other acute abnormalities. IMPRESSION: 1. The NG tube terminates above today's film. 2. Contrast has traversed the small bowel and is now seen in the distal small bowel and throughout the colon. A single mildly dilated loop of small bowel remains in the left upper to mid abdomen. Electronically Signed   By: Dorise Bullion III M.D   On: 09/05/2018 11:15    Cardiac Studies:  Assessment/Plan:  Resolving acute small bowel obstruction probably secondary to adhesions Hypertension Diabetes mellitus Status post prerenal azotemia Status post acute kidney injury Diabetic  neuropathy Coronary artery disease Valvular heart disease History of CVA Degenerative joint disease History of DVT/IVC filter in the past Plan Continue present management Advance diet as per surgery Avoid narcotics Ambulate as tolerated Check labs  LOS: 3 days    [name] 09/07/2018, 9:06 AM

## 2018-09-07 NOTE — Progress Notes (Signed)
Patient ID: Katherine [name], female   DOB: 06-05-1930, 85 y.o.   MRN: [id number]       Subjective: Feels about the same today. Of note, has chronic abdominal pain. Reports flatus, no BM. Some nausea.   Objective: Vital signs in last 24 hours: Temp:  [98.1 F (36.7 C)-98.8 F (37.1 C)] 98.8 F (37.1 C) (07/07 0533) Pulse Rate:  [67-73] 69 (07/07 0533) Resp:  [18] 18 (07/07 0533) BP: (139-144)/(79-93) 140/79 (07/07 0533) SpO2:  [97 %-100 %] 100 % (07/07 0533) Weight:  [82.6 kg] 82.6 kg (07/07 0533) Last BM Date: 09/04/18  Intake/Output from previous day: 07/06 0701 - 07/07 0700 In: 1746.7 [P.O.:360; I.V.:1386.7] Out: 2600 [Urine:2300; Emesis/NG output:300] Intake/Output this shift: No intake/output data recorded.  PE: Heart: regular Lungs: CTAB Abd: soft, mildly tender diffusely, not distended.  fentanyl patch on RLQ. Multiple surgical scars.   Lab Results:  Recent Labs    09/05/18 0348 09/06/18 0639  WBC 5.5 5.3  HGB 12.1 10.6*  HCT 38.1 34.0*  PLT 267 242   BMET Recent Labs    09/05/18 0348 09/06/18 0639  NA 148* 152*  K 3.4* 3.6  CL 114* 119*  CO2 21* 23  GLUCOSE 126* 164*  BUN 31* 17  CREATININE 1.14* 0.94  CALCIUM 9.6 9.2   PT/INR No results for input(s): LABPROT, INR in the last 72 hours. CMP     Component Value Date/Time   NA 152 (H) 09/06/2018 0639   K 3.6 09/06/2018 0639   CL 119 (H) 09/06/2018 0639   CO2 23 09/06/2018 0639   GLUCOSE 164 (H) 09/06/2018 0639   BUN 17 09/06/2018 0639   CREATININE 0.94 09/06/2018 0639   CALCIUM 9.2 09/06/2018 0639   PROT 8.3 (H) 09/04/2018 1630   ALBUMIN 4.3 09/04/2018 1630   AST 30 09/04/2018 1630   ALT 14 09/04/2018 1630   ALKPHOS 82 09/04/2018 1630   BILITOT 0.9 09/04/2018 1630   GFRNONAA 55 (L) 09/06/2018 0639   GFRAA >60 09/06/2018 0639   Lipase     Component Value Date/Time   LIPASE 31 09/04/2018 1630       Studies/Results: Dg Abd 1 View  Result Date: 09/06/2018 CLINICAL DATA:  Small  bowel obstruction EXAM: ABDOMEN - 1 VIEW COMPARISON:  09/05/2018 FINDINGS: Nasogastric tube with the tip at the esophagogastric junction. Recommend advancing the nasogastric tube 10 cm. Oral contrast material has transited into the colon and to the rectosigmoid junction. There is no bowel dilatation to suggest obstruction. There is no evidence of pneumoperitoneum, portal venous gas or pneumatosis. There are no pathologic calcifications along the expected course of the ureters. The osseous structures are unremarkable. IMPRESSION: 1. No evidence of obstruction. 2. Nasogastric tube with the tip at the esophagogastric junction. Recommend advancing the nasogastric tube 10 cm. Electronically Signed   By: Kathreen Devoid   On: 09/06/2018 10:01   Dg Abd Portable 1v-small Bowel Obstruction Protocol-initial, 8 Hr Delay  Result Date: 09/05/2018 CLINICAL DATA:  Small-bowel obstruction, 8 hour delayed film. EXAM: PORTABLE ABDOMEN - 1 VIEW COMPARISON:  None. FINDINGS: The NG tube terminates below today's film. The IVC filter is stable. Contrast is seen in the distal small bowel and throughout the colon. A single prominent loop of small bowel remains in the left upper quadrant measuring 3.8 cm. No other acute abnormalities. IMPRESSION: 1. The NG tube terminates above today's film. 2. Contrast has traversed the small bowel and is now seen in the distal small bowel and  throughout the colon. A single mildly dilated loop of small bowel remains in the left upper to mid abdomen. Electronically Signed   By: Dorise Bullion III M.D   On: 09/05/2018 11:15    Anti-infectives: Anti-infectives (From admission, onward)   None       Assessment/Plan  SBO -8-hr delay films show transit of contrast into colon, one persistently prominent LUQ sb loop. Her pain is in the rlq and mid-abdomen. Plain films yesterday without obstruction, contrast in rectum -needs to mobilize/ walk in the halls. -try suppository today  FEN - Advance to  clears VTE - Lovenox ID - none currently   LOS: 3 days    Clovis Riley , [facility] 09/07/2018, 7:42 AM

## 2018-09-07 NOTE — Care Management Important Message (Signed)
Important Message  Patient Details  Name: RUWEYDA MACKNIGHT MRN: [id number] Date of Birth: Dec 27, 1930   Medicare Important Message Given:  Yes     Shelda Altes 09/07/2018, 12:45 PM

## 2018-09-07 NOTE — Plan of Care (Signed)

## 2018-09-07 NOTE — Plan of Care (Signed)

## 2018-09-07 NOTE — Progress Notes (Signed)
PT Cancellation Note  Patient Details Name: Katherine [name] MRN: [id number] DOB: 1930/09/08   Cancelled Treatment:    Reason Eval/Treat Not Completed: Pain limiting ability to participate(despite receiving morphine pt reports abdominal pain 8/10 and denied mobility at this time)   [name] B [name] 09/07/2018, 12:15 PM [place] Pager: [phone] Office: [phone]

## 2018-09-07 NOTE — TOC Initial Note (Signed)
Transition of Care [facility]) - Initial/Assessment Note    Patient Details  Name: Katherine [name] MRN: [id number] Date of Birth: December 25, 1930  [facility] CM/SW Contact:    Candie Chroman, LCSW Phone Number: 09/07/2018, 12:36 PM  Clinical Narrative: CSW met with patient, introduced role, and explained that PT recommendations would be discussed. Patient agreeable to home health. Provided CMS list for agencies that serve her zip code. [facility] because she worked [facility] last year. Made referral to [facility] for PT and told him she would likely need a nurse as well. Patient already has an aide through [employer]. No DME recommendations from PT. She said one of her sisters may be able to take her home once discharged but it will depend on the day. No further concerns. CSW encouraged patient to contact CSW as needed. CSW will continue to follow patient for support and facilitate return home once medically stable.            Expected Discharge Plan: [place] Barriers to Discharge: Continued Medical Work up   Patient Goals and CMS Choice   CMS Medicare.gov Compare Post Acute Care list provided to:: Patient    Expected Discharge Plan and Services Expected Discharge Plan: [place] Choice: [place] arrangements for the past 2 months: Apartment Expected Discharge Date: 09/06/18                         HH Arranged: PT, RN [place] Agency: [place] ([place]) Date HH Agency Contacted: 09/07/18   Representative spoke with at [place]: [place] Arrangements/Services Living arrangements for the past 2 months: [place] with:: Self Patient language and need for interpreter reviewed:: Yes(No needs.) Do you feel safe going back to the place where you live?: Yes      Need for Family Participation in Patient Care: Yes (Comment) Care giver support system in place?: Yes (comment)(Home alone  but has an aide that comes in for a few hours per day.) Current home services: Homehealth aide, DME Criminal Activity/Legal Involvement Pertinent to Current Situation/Hospitalization: No - Comment as needed  Activities of Daily Living Home Assistive Devices/Equipment: Environmental consultant (specify type), Shower chair with back ADL Screening (condition at time of admission) Patient's cognitive ability adequate to safely complete daily activities?: Yes Is the patient deaf or have difficulty hearing?: No Does the patient have difficulty seeing, even when wearing glasses/contacts?: No Does the patient have difficulty concentrating, remembering, or making decisions?: No Patient able to express need for assistance with ADLs?: Yes Does the patient have difficulty dressing or bathing?: Yes Independently performs ADLs?: No Communication: Independent Dressing (OT): Needs assistance Is this a change from baseline?: Pre-admission baseline Grooming: Needs assistance Is this a change from baseline?: Pre-admission baseline Feeding: Independent Bathing: Needs assistance Is this a change from baseline?: Pre-admission baseline Toileting: Independent with device (comment) In/Out Bed: Independent with device (comment) Walks in Home: Independent with device (comment) Does the patient have difficulty walking or climbing stairs?: Yes Weakness of Legs: Both Weakness of Arms/Hands: None  Permission Sought/Granted Permission sought to share information with : Facility [facility] administrator granted to share info w AGENCY: [facility]        Emotional Assessment Appearance:: Appears stated age Attitude/Demeanor/Rapport: Engaged, Gracious Affect (typically observed): Accepting, Appropriate, Calm, Pleasant Orientation: : Oriented to  Self, Oriented to Place, Oriented to  Time, Oriented to Situation Alcohol / Substance Use: Never Used Psych Involvement: No (comment)  Admission diagnosis:   Encounter for nasogastric (NG) tube placement [Z46.59] Patient Active Problem List   Diagnosis Date Noted  . Small bowel obstruction ([place]) 10/31/2017  . Abdominal pain   . Nausea and vomiting   . Acute colitis 05/30/2017  . Chest pain 12/21/2015  . ACS (acute coronary syndrome) ([place]) 12/21/2015  . Bowel obstruction ([place]) 10/15/2013  . Angioedema due to seafood allergy 09/11/2013  . Nipple discharge, bloody 01/07/2012   PCP:  [name], MD Pharmacy:   [facility], [place] [address] [place] [address] [place] [address] Phone: [phone] Fax: [phone]  [id number] [place] [id number]: [phone]: [phone]    Social Determinants of Health (SDOH) Interventions    Readmission Risk Interventions No flowsheet data found.

## 2018-09-08 LAB — GLUCOSE, CAPILLARY
Glucose-Capillary: 138 mg/dL — ABNORMAL HIGH (ref 70–99)
Glucose-Capillary: 156 mg/dL — ABNORMAL HIGH (ref 70–99)
Glucose-Capillary: 154 mg/dL — ABNORMAL HIGH (ref 70–99)
Glucose-Capillary: 111 mg/dL — ABNORMAL HIGH (ref 70–99)

## 2018-09-08 LAB — CBC
HCT: 29.4 % — ABNORMAL LOW (ref 36.0–46.0)
Hemoglobin: 9.3 g/dL — ABNORMAL LOW (ref 12.0–15.0)
MCH: 28.3 pg (ref 26.0–34.0)
MCHC: 31.6 g/dL (ref 30.0–36.0)
MCV: 89.4 fL (ref 80.0–100.0)
Platelets: 202 10*3/uL (ref 150–400)
RBC: 3.29 MIL/uL — ABNORMAL LOW (ref 3.87–5.11)
RDW: 14 % (ref 11.5–15.5)
WBC: 4.7 10*3/uL (ref 4.0–10.5)
nRBC: 0 % (ref 0.0–0.2)

## 2018-09-08 LAB — BASIC METABOLIC PANEL
Anion gap: 6 (ref 5–15)
BUN: 5 mg/dL — ABNORMAL LOW (ref 8–23)
CO2: 22 mmol/L (ref 22–32)
Calcium: 8.4 mg/dL — ABNORMAL LOW (ref 8.9–10.3)
Chloride: 116 mmol/L — ABNORMAL HIGH (ref 98–111)
Creatinine, Ser: 0.84 mg/dL (ref 0.44–1.00)
GFR calc Af Amer: >60 mL/min (ref >60)
GFR calc non Af Amer: >60 mL/min (ref >60)
Glucose, Bld: 144 mg/dL — ABNORMAL HIGH (ref 70–99)
Potassium: 3.6 mmol/L (ref 3.5–5.1)
Sodium: 144 mmol/L (ref 135–145)

## 2018-09-08 MED ORDER — BOOST / RESOURCE BREEZE PO LIQD CUSTOM
1.0000 | Freq: Three times a day (TID) | ORAL | Status: DC
  Administered 2018-09-08 – 2018-09-10 (×4): 1 via ORAL

## 2018-09-08 NOTE — Plan of Care (Signed)

## 2018-09-08 NOTE — Progress Notes (Signed)
Subjective:  Denies chest pain or shortness of breath.  Had 2 large BM yesterday.  Up in chair today feeling better.  Tolerating clear liquids  Objective:  Vital Signs in the last 24 hours: Temp:  [98.7 F (37.1 C)-99.6 F (37.6 C)] 99.6 F (37.6 C) (07/08 0438) Pulse Rate:  [66-72] 70 (07/08 0438) Resp:  [18-19] 18 (07/08 0438) BP: (139-145)/(63-68) 141/63 (07/08 0438) SpO2:  [99 %-100 %] 99 % (07/08 0438) Weight:  [83.8 kg] 83.8 kg (07/08 0438)  Intake/Output from previous day: 07/07 0701 - 07/08 0700 In: 2408.5 [P.O.:960; I.V.:1448.5] Out: 250 [Urine:250] Intake/Output from this shift: Total I/O In: 120 [P.O.:120] Out: -   Physical Exam: Neck: no adenopathy, no carotid bruit, no JVD and supple, symmetrical, trachea midline Lungs: clear to auscultation bilaterally Heart: regular rate and rhythm, S1, S2 normal and 2/6 systolic murmur noted Abdomen: soft.  Bowel sounds present.  Mild generalized tenderness Extremities: extremities normal, atraumatic, no cyanosis or edema  Lab Results: Recent Labs    09/06/18 0639 09/08/18 0414  WBC 5.3 4.7  HGB 10.6* 9.3*  PLT 242 202   Recent Labs    09/06/18 0639 09/08/18 0414  NA 152* 144  K 3.6 3.6  CL 119* 116*  CO2 23 22  GLUCOSE 164* 144*  BUN 17 5*  CREATININE 0.94 0.84   No results for input(s): TROPONINI in the last 72 hours.  Invalid input(s): CK, MB Hepatic Function Panel No results for input(s): PROT, ALBUMIN, AST, ALT, ALKPHOS, BILITOT, BILIDIR, IBILI in the last 72 hours. No results for input(s): CHOL in the last 72 hours. No results for input(s): PROTIME in the last 72 hours.  Imaging: Imaging results have been reviewed and No results found.  Cardiac Studies:  Assessment/Plan:  Resolving acute small bowel obstruction probably secondary to adhesions Hypertension Diabetes mellitus Status post prerenal azotemia Status post acute kidney injury Diabetic neuropathy Acute on chronic anemia secondary to  hydration Coronary artery disease Valvular heart disease History of CVA Degenerative joint disease History of DVT/IVC filter in the past Plan Reduce IV fluids to [facility]. Advance diet as tolerated. Ambulate as tolerated. Check stool for occult blood.  Check labs in a.m. Possible discharge tomorrow if stable and okay with surgery  LOS: 4 days    [name] 09/08/2018, 11:20 AM

## 2018-09-08 NOTE — Progress Notes (Signed)
Patient ID: Katherine [name], female   DOB: 02-Sep-1930, 85 y.o.   MRN: [id number]       Subjective: Reports 2 large bowel movements yesterday, continues with flatus. Took in about 1L of liquids yesterday, no emesis.   Objective: Vital signs in last 24 hours: Temp:  [98.7 F (37.1 C)-99.6 F (37.6 C)] 99.6 F (37.6 C) (07/08 0438) Pulse Rate:  [66-72] 70 (07/08 0438) Resp:  [18-19] 18 (07/08 0438) BP: (139-145)/(63-68) 141/63 (07/08 0438) SpO2:  [99 %-100 %] 99 % (07/08 0438) Weight:  [83.8 kg] 83.8 kg (07/08 0438) Last BM Date: 09/07/18  Intake/Output from previous day: 07/07 0701 - 07/08 0700 In: 2408.5 [P.O.:960; I.V.:1448.5] Out: 250 [Urine:250] Intake/Output this shift: No intake/output data recorded.  PE: Heart: regular Lungs: CTAB Abd: soft, mildly tender diffusely, not distended.  fentanyl patch on RLQ. Multiple surgical scars.   Lab Results:  Recent Labs    09/06/18 0639 09/08/18 0414  WBC 5.3 4.7  HGB 10.6* 9.3*  HCT 34.0* 29.4*  PLT 242 202   BMET Recent Labs    09/06/18 0639 09/08/18 0414  NA 152* 144  K 3.6 3.6  CL 119* 116*  CO2 23 22  GLUCOSE 164* 144*  BUN 17 5*  CREATININE 0.94 0.84  CALCIUM 9.2 8.4*   PT/INR No results for input(s): LABPROT, INR in the last 72 hours. CMP     Component Value Date/Time   NA 144 09/08/2018 0414   K 3.6 09/08/2018 0414   CL 116 (H) 09/08/2018 0414   CO2 22 09/08/2018 0414   GLUCOSE 144 (H) 09/08/2018 0414   BUN 5 (L) 09/08/2018 0414   CREATININE 0.84 09/08/2018 0414   CALCIUM 8.4 (L) 09/08/2018 0414   PROT 8.3 (H) 09/04/2018 1630   ALBUMIN 4.3 09/04/2018 1630   AST 30 09/04/2018 1630   ALT 14 09/04/2018 1630   ALKPHOS 82 09/04/2018 1630   BILITOT 0.9 09/04/2018 1630   GFRNONAA >60 09/08/2018 0414   GFRAA >60 09/08/2018 0414   Lipase     Component Value Date/Time   LIPASE 31 09/04/2018 1630       Studies/Results: Dg Abd 1 View  Result Date: 09/06/2018 CLINICAL DATA:  Small bowel  obstruction EXAM: ABDOMEN - 1 VIEW COMPARISON:  09/05/2018 FINDINGS: Nasogastric tube with the tip at the esophagogastric junction. Recommend advancing the nasogastric tube 10 cm. Oral contrast material has transited into the colon and to the rectosigmoid junction. There is no bowel dilatation to suggest obstruction. There is no evidence of pneumoperitoneum, portal venous gas or pneumatosis. There are no pathologic calcifications along the expected course of the ureters. The osseous structures are unremarkable. IMPRESSION: 1. No evidence of obstruction. 2. Nasogastric tube with the tip at the esophagogastric junction. Recommend advancing the nasogastric tube 10 cm. Electronically Signed   By: Kathreen Devoid   On: 09/06/2018 10:01    Anti-infectives: Anti-infectives (From admission, onward)   None       Assessment/Plan  SBO -8-hr delay films show transit of contrast into colon, one persistently prominent LUQ sb loop. Her pain is in the rlq and mid-abdomen. Plain films showed resolution of obstruction and she is clinically progressing -needs to mobilize/ walk in the halls. -try suppository today  FEN - Advance to full liquid VTE - Lovenox ID - none currently  If she is able to tolerate a full liquid diet and continues to have bowel movements, she can be cleared for discharge from the standpoint of surgery.  LOS: 4 days    Clovis Riley , [facility] [facility] 09/08/2018, 8:30 AM

## 2018-09-09 LAB — BASIC METABOLIC PANEL
Anion gap: 7 (ref 5–15)
BUN: <5 mg/dL — ABNORMAL LOW (ref 8–23)
CO2: 21 mmol/L — ABNORMAL LOW (ref 22–32)
Calcium: 8.6 mg/dL — ABNORMAL LOW (ref 8.9–10.3)
Chloride: 115 mmol/L — ABNORMAL HIGH (ref 98–111)
Creatinine, Ser: 0.82 mg/dL (ref 0.44–1.00)
GFR calc Af Amer: >60 mL/min (ref >60)
GFR calc non Af Amer: >60 mL/min (ref >60)
Glucose, Bld: 119 mg/dL — ABNORMAL HIGH (ref 70–99)
Potassium: 3.8 mmol/L (ref 3.5–5.1)
Sodium: 143 mmol/L (ref 135–145)

## 2018-09-09 LAB — GLUCOSE, CAPILLARY
Glucose-Capillary: 123 mg/dL — ABNORMAL HIGH (ref 70–99)
Glucose-Capillary: 164 mg/dL — ABNORMAL HIGH (ref 70–99)
Glucose-Capillary: 160 mg/dL — ABNORMAL HIGH (ref 70–99)
Glucose-Capillary: 173 mg/dL — ABNORMAL HIGH (ref 70–99)

## 2018-09-09 LAB — CBC
HCT: 29.5 % — ABNORMAL LOW (ref 36.0–46.0)
Hemoglobin: 9.3 g/dL — ABNORMAL LOW (ref 12.0–15.0)
MCH: 28.2 pg (ref 26.0–34.0)
MCHC: 31.5 g/dL (ref 30.0–36.0)
MCV: 89.4 fL (ref 80.0–100.0)
Platelets: 195 10*3/uL (ref 150–400)
RBC: 3.3 MIL/uL — ABNORMAL LOW (ref 3.87–5.11)
RDW: 13.6 % (ref 11.5–15.5)
WBC: 4.4 10*3/uL (ref 4.0–10.5)
nRBC: 0 % (ref 0.0–0.2)

## 2018-09-09 NOTE — Progress Notes (Signed)
Subjective:  Patient denies any chest pain or shortness of breath.  States not feel strong enough to go home today, tolerating soft diet, had BM yesterday.  Abdominal pain improved  Objective:  Vital Signs in the last 24 hours: Temp:  [98.1 F (36.7 C)-98.5 F (36.9 C)] 98.5 F (36.9 C) (07/09 1139) Pulse Rate:  [52-70] 52 (07/09 1139) Resp:  [16-20] 20 (07/09 1139) BP: (120-149)/(56-99) 120/56 (07/09 1139) SpO2:  [99 %-100 %] 99 % (07/09 1139) Weight:  [84.6 kg] 84.6 kg (07/09 0535)  Intake/Output from previous day: 07/08 0701 - 07/09 0700 In: 840 [P.O.:840] Out: -  Intake/Output from this shift: Total I/O In: 926.1 [I.V.:926.1] Out: -   Physical Exam: Neck: no adenopathy, no carotid bruit, no JVD and supple, symmetrical, trachea midline Lungs: clear to auscultation bilaterally Heart: regular rate and rhythm, S1, S2 normal and 2/6 systolic murmur noted Abdomen: soft, bowel sounds present.  Mild generalized tenderness.  No guarding Extremities: extremities normal, atraumatic, no cyanosis or edema  Lab Results: Recent Labs    09/08/18 0414 09/09/18 0523  WBC 4.7 4.4  HGB 9.3* 9.3*  PLT 202 195   Recent Labs    09/08/18 0414 09/09/18 0523  NA 144 143  K 3.6 3.8  CL 116* 115*  CO2 22 21*  GLUCOSE 144* 119*  BUN 5* <5*  CREATININE 0.84 0.82   No results for input(s): TROPONINI in the last 72 hours.  Invalid input(s): CK, MB Hepatic Function Panel No results for input(s): PROT, ALBUMIN, AST, ALT, ALKPHOS, BILITOT, BILIDIR, IBILI in the last 72 hours. No results for input(s): CHOL in the last 72 hours. No results for input(s): PROTIME in the last 72 hours.  Imaging: Imaging results have been reviewed and No results found.  Cardiac Studies:  Assessment/Plan:  Resolving acute small bowel obstruction probably secondary to adhesions Hypertension Diabetes mellitus Status post prerenal azotemia Status post acute kidney injury Diabetic neuropathy Acute on  chronic anemia secondary to hydration Coronary artery disease Valvular heart disease History of CVA Degenerative joint disease History of DVT/IVC filter in the past Plan Continue present management. Advance diet as tolerated. Discussed with patient regarding skilled nursing facility states wants to go home, but don't feel strong enough today to go home.    LOS: 5 days    [name] 09/09/2018, 12:34 PM

## 2018-09-09 NOTE — Progress Notes (Signed)
  [facility] Progress Note     Subjective: CC-  Patient states that she had a couple more loose BMs yesterday. She reports mild nausea with juice but tolerated all other full liquids. No emesis.  Objective: Vital signs in last 24 hours: Temp:  [98 F (36.7 C)-98.1 F (36.7 C)] 98.1 F (36.7 C) (07/08 2037) Pulse Rate:  [56-70] 70 (07/08 2037) Resp:  [16-18] 16 (07/08 2037) BP: (149-151)/(73-99) 149/99 (07/08 2037) SpO2:  [100 %] 100 % (07/08 2037) Weight:  [84.6 kg] 84.6 kg (07/09 0535) Last BM Date: 09/08/18  Intake/Output from previous day: 07/08 0701 - 07/09 0700 In: 840 [P.O.:840] Out: -  Intake/Output this shift: No intake/output data recorded.  PE: Gen:  Alert, NAD, pleasant HEENT: EOM's intact, pupils equal and round Card:  RRR Pulm:  CTAB, no W/R/R, effort normal Abd: several well healed scars, soft, nondistended, mild diffuse tenderness without rebound or guarding Skin: warm and dry  Lab Results:  Recent Labs    09/08/18 0414 09/09/18 0523  WBC 4.7 4.4  HGB 9.3* 9.3*  HCT 29.4* 29.5*  PLT 202 195   BMET Recent Labs    09/08/18 0414 09/09/18 0523  NA 144 143  K 3.6 3.8  CL 116* 115*  CO2 22 21*  GLUCOSE 144* 119*  BUN 5* <5*  CREATININE 0.84 0.82  CALCIUM 8.4* 8.6*   PT/INR No results for input(s): LABPROT, INR in the last 72 hours. CMP     Component Value Date/Time   NA 143 09/09/2018 0523   K 3.8 09/09/2018 0523   CL 115 (H) 09/09/2018 0523   CO2 21 (L) 09/09/2018 0523   GLUCOSE 119 (H) 09/09/2018 0523   BUN <5 (L) 09/09/2018 0523   CREATININE 0.82 09/09/2018 0523   CALCIUM 8.6 (L) 09/09/2018 0523   PROT 8.3 (H) 09/04/2018 1630   ALBUMIN 4.3 09/04/2018 1630   AST 30 09/04/2018 1630   ALT 14 09/04/2018 1630   ALKPHOS 82 09/04/2018 1630   BILITOT 0.9 09/04/2018 1630   GFRNONAA >60 09/09/2018 0523   GFRAA >60 09/09/2018 0523   Lipase     Component Value Date/Time   LIPASE 31 09/04/2018 1630        Studies/Results: No results found.  Anti-infectives: Anti-infectives (From admission, onward)   None       Assessment/Plan SBO -8-hr delay films show transit of contrast into colon, one persistently prominent LUQ sb loop; repeat film showed no evidence of obstruction -tolerating fulls and having bowel function  FEN - soft diet VTE - Lovenox ID - none  Foley - none Follow up - no surgical follow up  Plan - Patient still with some abdominal pain but she is tolerating full liquids and having bowel function. Advance to soft diet. Mobilize. Patient stable for discharge from surgical standpoint.    LOS: 5 days    [place] , [facility] Surgery 09/09/2018, 9:35 AM Pager: [phone] Mon-Thurs 7:00 am-4:30 pm Fri 7:00 am -11:30 AM Sat-Sun 7:00 am-11:30 am

## 2018-09-09 NOTE — Progress Notes (Signed)
Physical Therapy Treatment Patient Details Name: Katherine [name] MRN: [id number] DOB: [birth year] Today's Date: 09/09/2018    History of Present Illness Patient is a 85 y/o female who presents with N/V and found to have a SBO. PMH includes multiple surgeries, HTN, CVA, DVT, CAD, DM, CKD, CHF.    PT Comments    Pt reports decreased pain after morphine this am and willingness to participate in mobility. Pt able to transfer to [facility] and walk in hall but declined HEP stating she doesn't want to aggravate her stomach. Pt educated for importance of mobility and bowel mobilization as well. Will continue to follow to promote independence for safe return home.     Follow Up Recommendations  Home health PT;Supervision for mobility/OOB     Equipment Recommendations  None recommended by PT    Recommendations for Other Services       Precautions / Restrictions Precautions Precautions: Fall    Mobility  Bed Mobility Overal bed mobility: Needs Assistance Bed Mobility: Supine to Sit     Supine to sit: Supervision     General bed mobility comments: pt able to transition to EOB with increased time and reliance on rail, supervision for lines  Transfers Overall transfer level: Needs assistance   Transfers: Sit to/from Stand;Stand Pivot Transfers Sit to Stand: Supervision Stand pivot transfers: Supervision       General transfer comment: supervision for safety, lines and hand placement to stand from bed, pivot to BSc with single UE support on RW with transfers and cues for sequence. Pt able to perform pericare in standing without assist  Ambulation/Gait Ambulation/Gait assistance: Min guard Gait Distance (Feet): 180 Feet Assistive device: Rolling walker (2 wheeled) Gait Pattern/deviations: Step-through pattern;Trunk flexed;Decreased stride length   Gait velocity interpretation: >2.62 ft/sec, indicative of community ambulatory General Gait Details: cues for posture and position in RW  with pt declining further gait due to fatigue   Stairs             Wheelchair Mobility    Modified Rankin (Stroke Patients Only)       Balance   Sitting-balance support: Feet supported;No upper extremity supported Sitting balance-Leahy Scale: Good     Standing balance support: No upper extremity supported;Bilateral upper extremity supported Standing balance-Leahy Scale: Fair Standing balance comment: pt able to stand for pericare without UE support, RW use for gait                            Cognition Arousal/Alertness: Awake/alert Behavior During Therapy: WFL for tasks assessed/performed Overall Cognitive Status: Within Functional Limits for tasks assessed                                        Exercises      General Comments        Pertinent Vitals/Pain Pain Score: 4  Pain Location: abdomen Pain Descriptors / Indicators: Aching;Discomfort Pain Intervention(s): Limited activity within patient's tolerance;Repositioned;Monitored during session;Premedicated before session    Home Living                      Prior Function            PT Goals (current goals can now be found in the care plan section) Progress towards PT goals: Progressing toward goals    Frequency  PT Plan Current plan remains appropriate    Co-evaluation              AM-PAC PT "6 Clicks" Mobility   Outcome Measure  Help needed turning from your back to your side while in a flat bed without using bedrails?: None Help needed moving from lying on your back to sitting on the side of a flat bed without using bedrails?: None Help needed moving to and from a bed to a chair (including a wheelchair)?: A Little Help needed standing up from a chair using your arms (e.g., wheelchair or bedside chair)?: A Little Help needed to walk in hospital room?: A Little Help needed climbing 3-5 steps with a railing? : A Little 6 Click Score: 20     End of Session Equipment Utilized During Treatment: Gait belt Activity Tolerance: Patient tolerated treatment well Patient left: in chair;with call bell/phone within reach;with chair alarm set Nurse Communication: Mobility status PT Visit Diagnosis: Muscle weakness (generalized) (M62.81);Difficulty in walking, not elsewhere classified (R26.2);Other abnormalities of gait and mobility (R26.89)     Time: 0355-9741 PT Time Calculation (min) (ACUTE ONLY): 26 min  Charges:  $Gait Training: 8-22 mins $Therapeutic Activity: 8-22 mins                     Johnson, PT Acute Rehabilitation Services Pager: [phone] Office: [place] 09/09/2018, 1:05 PM

## 2018-09-10 LAB — GLUCOSE, CAPILLARY
Glucose-Capillary: 147 mg/dL — ABNORMAL HIGH (ref 70–99)
Glucose-Capillary: 213 mg/dL — ABNORMAL HIGH (ref 70–99)

## 2018-09-10 MED ORDER — INSULIN ASPART PROT & ASPART (70-30 MIX) 100 UNIT/ML ~~LOC~~ SUSP: 20.0000 [IU] | Freq: Two times a day (BID) | SUBCUTANEOUS | 11 refills | Status: DC

## 2018-09-10 MED ORDER — METOPROLOL TARTRATE 25 MG PO TABS: 12.5000 mg | ORAL_TABLET | Freq: Two times a day (BID) | ORAL | 3 refills | Status: DC

## 2018-09-10 MED ORDER — PANTOPRAZOLE SODIUM 40 MG PO TBEC: 40.0000 mg | DELAYED_RELEASE_TABLET | Freq: Every day | ORAL | Status: DC

## 2018-09-10 NOTE — Care Management Important Message (Signed)
Important Message  Patient Details  Name: Katherine [name] MRN: [id number] Date of Birth: [birth year]   Medicare Important Message Given:  Yes     Shelda Altes 09/10/2018, 1:02 PM

## 2018-09-10 NOTE — Progress Notes (Signed)
Physical Therapy Treatment Patient Details Name: Katherine [name] MRN: [id number] DOB: 1930-11-11 Today's Date: 09/10/2018    History of Present Illness Patient is a 85 y/o female admitted with N/V and found to have a SBO. PMH includes multiple surgeries, HTN, CVA, DVT, CAD, DM, CKD, CHF.    PT Comments    Pt very pleasant sitting in chair on arrival and willing to walk. Pt requested return to bed end of session and again refused any HEP or further activity beyond walking. Pt is tolerating gait and transfer with increased ease and despite stating pain 4/10 in abdomen during gait reported 8/10 with return to supine with request for pain medication. Recommend continued mobility with staff for return home.     Follow Up Recommendations  Home health PT;Supervision for mobility/OOB     Equipment Recommendations  None recommended by PT    Recommendations for Other Services       Precautions / Restrictions Precautions Precautions: Fall    Mobility  Bed Mobility   Bed Mobility: Sit to Supine           General bed mobility comments: pt able to transition from sit to supine without assist  Transfers Overall transfer level: Needs assistance     Sit to Stand: Supervision         General transfer comment: supervision for lines with pt able to stand from chair and to bed without cues  Ambulation/Gait Ambulation/Gait assistance: Supervision Gait Distance (Feet): 230 Feet Assistive device: Rolling walker (2 wheeled) Gait Pattern/deviations: Step-through pattern;Trunk flexed;Decreased stride length   Gait velocity interpretation: >2.62 ft/sec, indicative of community ambulatory General Gait Details: cues for posture and position in RW, pt with increased trunk flexion with fatigue but increased overall gait tolerance   Stairs             Wheelchair Mobility    Modified Rankin (Stroke Patients Only)       Balance   Sitting-balance support: Feet supported;No  upper extremity supported Sitting balance-Leahy Scale: Good     Standing balance support: Bilateral upper extremity supported Standing balance-Leahy Scale: Fair Standing balance comment: RW use for gait                            Cognition Arousal/Alertness: Awake/alert Behavior During Therapy: WFL for tasks assessed/performed Overall Cognitive Status: Within Functional Limits for tasks assessed                                        Exercises      General Comments        Pertinent Vitals/Pain Pain Score: 8  Pain Location: abdomen Pain Descriptors / Indicators: Aching;Discomfort Pain Intervention(s): Limited activity within patient's tolerance;Repositioned;Monitored during session;Patient requesting pain meds-RN notified    Home Living                      Prior Function            PT Goals (current goals can now be found in the care plan section) Progress towards PT goals: Progressing toward goals    Frequency           PT Plan Current plan remains appropriate    Co-evaluation              AM-PAC PT "6 Clicks" Mobility  Outcome Measure  Help needed turning from your back to your side while in a flat bed without using bedrails?: None Help needed moving from lying on your back to sitting on the side of a flat bed without using bedrails?: None Help needed moving to and from a bed to a chair (including a wheelchair)?: A Little Help needed standing up from a chair using your arms (e.g., wheelchair or bedside chair)?: A Little Help needed to walk in hospital room?: A Little Help needed climbing 3-5 steps with a railing? : A Little 6 Click Score: 20    End of Session   Activity Tolerance: Patient tolerated treatment well Patient left: in bed;with call bell/phone within reach Nurse Communication: Mobility status PT Visit Diagnosis: Muscle weakness (generalized) (M62.81);Difficulty in walking, not elsewhere  classified (R26.2);Other abnormalities of gait and mobility (R26.89)     Time: 5726-2035 PT Time Calculation (min) (ACUTE ONLY): 16 min  Charges:  $Gait Training: 8-22 mins                     Scanlon, PT Acute Rehabilitation Services Pager: [phone] Office: [place] 09/10/2018, 1:46 PM

## 2018-09-10 NOTE — Discharge Instructions (Signed)
Bowel Obstruction °A bowel obstruction means that something is blocking the small or large bowel. The bowel is also called the intestine. It is the long tube that connects the stomach to the opening of the butt (anus). When something blocks the bowel, food and fluids cannot pass through like normal. This condition needs to be treated. Treatment depends on the cause of the problem and how bad the problem is. °What are the causes? °Common causes of this condition include: °· Scar tissue (adhesions) from past surgery or from high-energy X-rays (radiation). °· Recent surgery in the belly. This affects how food moves in the bowel. °· Some diseases, such as: °? Irritation of the lining of the digestive tract (Crohn's disease). °? Irritation of small pouches in the bowel (diverticulitis). °· Growths or tumors. °· A bulging organ (hernia). °· Twisting of the bowel (volvulus). °· A foreign body. °· Slipping of a part of the bowel into another part (intussusception). °What are the signs or symptoms? °Symptoms of this condition include: °· Pain in the belly. °· Feeling sick to your stomach (nauseous). °· Throwing up (vomiting). °· Bloating in the belly. °· Being unable to pass gas. °· Trouble pooping (constipation). °· Watery poop (diarrhea). °· A lot of belching. °How is this diagnosed? °This condition may be diagnosed based on: °· A physical exam. °· Medical history. °· Imaging tests, such as X-ray or CT scan. °· Blood tests. °· Urine tests. °How is this treated? °Treatment for this condition may include: °· Fluids and pain medicines that are given through an IV tube. Your doctor may tell you not to eat or drink if you feel sick to your stomach and are throwing up. °· Eating a clear liquid diet for a few days. °· Putting a small tube (nasogastric tube) into the stomach. This will help with pain, discomfort, and nausea by removing blocked air and fluids from the stomach. °· Surgery. This may be needed if other treatments do  not work. °Follow these instructions at home: °Medicines °· Take over-the-counter and prescription medicines only as told by your doctor. °· If you were prescribed an antibiotic medicine, take it as told by your doctor. Do not stop taking the antibiotic even if you start to feel better. °General instructions °· Follow your diet as told by your doctor. You may need to: °? Only drink clear liquids until you start to get better. °? Avoid solid foods. °· Return to your normal activities as told by your doctor. Ask your doctor what activities are safe for you. °· Do not sit for a long time without moving. Get up to take short walks every 1-2 hours. This is important. Ask for help if you feel weak or unsteady. °· Keep all follow-up visits as told by your doctor. This is important. °How is this prevented? °After having a bowel obstruction, you may be more likely to have another. You can do some things to stop it from happening again. °· If you have a long-term (chronic) disease, contact your doctor if you see changes or problems. °· Take steps to prevent or treat trouble pooping. Your doctor may ask that you: °? Drink enough fluid to keep your pee (urine) pale yellow. °? Take over-the-counter or prescription medicines. °? Eat foods that are high in fiber. These include beans, whole grains, and fresh fruits and vegetables. °? Limit foods that are high in fat and sugar. These include fried or sweet foods. °· Stay active. Ask your doctor which exercises are   safe for you. °· Avoid stress. °· Eat three small meals and three small snacks each day. °· Work with a food expert (dietitian) to make a meal plan that works for you. °· Do not use any products that contain nicotine or tobacco, such as cigarettes and e-cigarettes. If you need help quitting, ask your doctor. °Contact a doctor if: °· You have a fever. °· You have chills. °Get help right away if: °· You have pain or cramps that get worse. °· You throw up blood. °· You are  sick to your stomach. °· You cannot stop throwing up. °· You cannot drink fluids. °· You feel mixed up (confused). °· You feel very thirsty (dehydrated). °· Your belly gets more bloated. °· You feel weak or you pass out (faint). °Summary °· A bowel obstruction means that something is blocking the small or large bowel. °· Treatment may include IV fluids and pain medicine. You may also have a clear liquid diet, a small tube in your stomach, or surgery. °· Drink clear liquids and avoid solid foods until you get better. °This information is not intended to replace advice given to you by your health care provider. Make sure you discuss any questions you have with your health care provider. °Document Released: 03/27/2004 Document Revised: 07/01/2017 Document Reviewed: 07/01/2017 °Elsevier Patient Education © 2020 [employer] ° °

## 2018-09-10 NOTE — Progress Notes (Signed)
  [facility] Progress Note     Subjective: CC-  Continues to tolerate soft diet and have bowel movements. Still with chronic RLQ abdominal pain  Objective: Vital signs in last 24 hours: Temp:  [98.5 F (36.9 C)-99.2 F (37.3 C)] 98.5 F (36.9 C) (07/10 0415) Pulse Rate:  [52-65] 60 (07/10 0640) Resp:  [20] 20 (07/10 0415) BP: (120-145)/(56-77) 132/56 (07/10 0415) SpO2:  [99 %-100 %] 99 % (07/10 0415) Weight:  [83.9 kg] 83.9 kg (07/10 0548) Last BM Date: 09/09/18  Intake/Output from previous day: 07/09 0701 - 07/10 0700 In: 1166.1 [P.O.:240; I.V.:926.1] Out: 526 [Urine:525; Stool:1] Intake/Output this shift: No intake/output data recorded.  PE: Gen:  Alert, NAD, pleasant HEENT: EOM's intact, pupils equal and round Card:  RRR Pulm:  CTAB, no W/R/R, effort normal Abd: several well healed scars, soft, nondistended, mild right sided tenderness without rebound or guarding Skin: warm and dry  Lab Results:  Recent Labs    09/08/18 0414 09/09/18 0523  WBC 4.7 4.4  HGB 9.3* 9.3*  HCT 29.4* 29.5*  PLT 202 195   BMET Recent Labs    09/08/18 0414 09/09/18 0523  NA 144 143  K 3.6 3.8  CL 116* 115*  CO2 22 21*  GLUCOSE 144* 119*  BUN 5* <5*  CREATININE 0.84 0.82  CALCIUM 8.4* 8.6*   PT/INR No results for input(s): LABPROT, INR in the last 72 hours. CMP     Component Value Date/Time   NA 143 09/09/2018 0523   K 3.8 09/09/2018 0523   CL 115 (H) 09/09/2018 0523   CO2 21 (L) 09/09/2018 0523   GLUCOSE 119 (H) 09/09/2018 0523   BUN <5 (L) 09/09/2018 0523   CREATININE 0.82 09/09/2018 0523   CALCIUM 8.6 (L) 09/09/2018 0523   PROT 8.3 (H) 09/04/2018 1630   ALBUMIN 4.3 09/04/2018 1630   AST 30 09/04/2018 1630   ALT 14 09/04/2018 1630   ALKPHOS 82 09/04/2018 1630   BILITOT 0.9 09/04/2018 1630   GFRNONAA >60 09/09/2018 0523   GFRAA >60 09/09/2018 0523   Lipase     Component Value Date/Time   LIPASE 31 09/04/2018 1630        Studies/Results: No results found.  Anti-infectives: Anti-infectives (From admission, onward)   None       Assessment/Plan SBO -8-hr delay films show transit of contrast into colon, one persistently prominent LUQ sb loop; repeat film showed no evidence of obstruction -tolerating soft diet and having bowel function  FEN - soft diet VTE - Lovenox ID - none  Foley - none Follow up - no surgical follow up  Plan - Patient still with some abdominal pain (chronic) but she is tolerating soft diet and having bowel function. Mobilize. Patient stable for discharge from surgical standpoint. Surgery will sign off at this time- please call if we can be of help   LOS: 6 days    [facility] 09/10/2018, 8:33 AM

## 2018-09-10 NOTE — Discharge Summary (Signed)
NAME: Katherine [name], VIGLIONE MEDICAL RECORD [id number] ACCOUNT [id number] DATE OF BIRTH:01/12/1931 FACILITY: [facility] LOCATION: [facility] PHYSICIAN:Ashe Gago Daivd Council, MD  DISCHARGE SUMMARY  DATE OF DISCHARGE:  09/10/2018  DATE OF ADMISSION:  09/04/2018  DATE OF DISCHARGE:  09/10/2018  ADMITTING DIAGNOSES: 1.  Acute small-bowel obstruction, probably secondary to adhesions. 2.  Hypertension. 3.  Diabetes mellitus. 4.  Prerenal azotemia. 5.  Dehydration. 6.  Acute renal injury. 7.  Diabetic neuropathy. 8.  Coronary artery disease. 9.  Valvular heart disease. 10.  History of cerebrovascular accident. 11.  Degenerative joint disease. 12.  History of deep venous thrombosis/inferior vena cava filter in the past. 13.  Gastroesophageal reflux disease. 13.  Chronic venous insufficiency.  FINAL DIAGNOSES: 1.  Status post acute small-bowel obstruction, treated conservatively. 2.  Hypertension. 3.  Diabetes mellitus. 4.  Diabetic neuropathy. 5.  Status post dehydration, status post acute renal injury secondary to above. 6.  Anemia of chronic disease. 7.  Valvular heart disease. 8.  Coronary artery disease. 9.  History of cerebrovascular accident. 10.  Degenerative joint disease. 11.  Gastroesophageal reflux disease. 12.  History of deep venous thrombosis/inferior vena cava filter in the past. 13.  Chronic venous insufficiency.  DISCHARGE HOME MEDICATIONS: 1.  Amlodipine/valsartan 5/320 mg 1 tablet daily. 2.  Atorvastatin 40 mg daily. 3.  Lumigan eyedrops one drop in both eyes as before. 4.  Alphagan P eyedrops as before. 5.  Clopidogrel 75 mg daily. 6.  Bentyl 1 capsule 3 times daily as needed. 7.  Trusopt eyedrops daily as before. 8.  Vitamin D2, 50,000 units every Saturday. 9.  Nitrostat 0.4 mg sublingual p.r.n. 10.  Nitro-Dur patch 0.4 mg per hour daily. 11.  Omeprazole 40 mg daily. 12.  Potassium chloride 20 mEq half tablet daily. 13.  Rhopressa eyedrops as before. 14.  NovoLog  mix 70/30 dose has been reduced to 20 units twice daily. 15.  Metoprolol 25 mg half tablet twice daily.  The patient has been advised to stop fentanyl, Imodium and torsemide.  DIET:  Low salt, low cholesterol 1800 calories ADA diet.  DISCHARGE INSTRUCTIONS:  The patient has been advised to monitor blood sugar twice daily.  Follow up with me in 1 week.  Follow up with surgery as needed.  Followup up with PMD as scheduled.  CONDITION AT DISCHARGE:  Stable.  BRIEF HISTORY AND HOSPITAL COURSE:  The patient is an 85 year old female with past medical history significant for multiple medical problems, i.e., coronary artery disease, hypertension, insulin-requiring diabetes mellitus, history of congestive heart  failure secondary to preserved systolic function, history of CVA in the past, chronic venous insufficiency, degenerative joint disease, history of deep venous thrombosis in the past, status post IVC filter in the past, history of multiple abdominal  surgeries and small-bowel obstruction in the past.  She came to the ER complaining of abdominal pain associated with nausea and vomiting since 2 a.m.  The patient states she did have one bowel movement earlier this morning, but due to continued abdominal  pain, decided to come to the ED.  The patient had CT of the abdomen which showed a small-bowel obstruction.  The patient was placed on NG Gomco suction in the ED with improvement in her abdominal pain.  The patient denies any chest pain or shortness of  breath.  Denies palpitation, lightheadedness or syncope.  Denies PND, orthopnea, leg swelling.  States her states she gets BM every other day approximately.  Denies any fever or chills.  Denies any urinary  complaints.  PHYSICAL EXAMINATION: GENERAL:  She was alert, awake, oriented x3. VITAL SIGNS:  Blood pressure was 142/69, pulse 83.  She was afebrile. HEENT:  Conjunctivae are pink. NECK:  Supple, no JVD, no bruit. LUNGS:  Clear to auscultation  without rhonchi or rales. CARDIOVASCULAR:  S1, S2 was normal.  There was II/VI systolic murmur. ABDOMEN:  Soft.  Bowel sounds faint, mildly distended.  Mild generalized tenderness noted.  No guarding.  Surgical scars noted. NEUROLOGIC:  Grossly intact. EXTREMITIES:  There is no clubbing, cyanosis or edema.  LABORATORY DATA:  Sodium was 142, potassium was 4.8, BUN 38, creatinine 1.44, glucose was 188.  Hemoglobin was 13.1, hematocrit 41.5, white count of 5.7.  Her hemoglobin A1c was 7.0.  The patient had CT of the abdomen which showed mechanical mid  small-bowel obstruction with probable transition point in the anterior left lower quadrant, probably due to adhesions.  No bowel wall thickening, pneumatosis, pneumoperitoneum, aortic atherosclerosis.  Repeat x-ray done showed contrast has traversed the  small bowel and is now seen in the distal small bowel and throughout the colon.  The single mildly dilated loop in the small bowel remains in the left upper to mid abdomen.  Repeat abdominal ____ showed no evidence of obstruction.  Repeat electrolytes  showed a sodium of 152, potassium 3.6, glucose 164, BUN 17, creatinine 0.94, hemoglobin of 10.6, hematocrit 34, white count of 5.3.  BRIEF HOSPITAL COURSE:  The patient was admitted to telemetry unit.  The patient was kept n.p.o. and was started on slow NG Gomco suction.  Surgical consultation was obtained.  The patient was treated conservatively with improvement in her abdominal pain  and resolution of intestinal obstruction.  The patient had a BM for the last 2 days.  She is tolerating a clear liquid diet, which was advanced to a soft diet yesterday, which she is tolerating okay.  The patient continues to have mild abdominal  discomfort, but with good bowel sounds.  There were no further episodes of nausea or vomiting.  PT consultation was called.  The patient has been ambulating with the help of a walker.  Discussed with the patient regarding skilled  nursing facility, but  states she wanted to go home.  The patient will be discharged home on above medications and will be followed up in my office in 1 week.  TN/NUANCE D:09/10/2018 T:09/10/2018 JOB:[id number]

## 2018-09-10 NOTE — Discharge Summary (Signed)
Discharge summary dictated on 09/10/2018, dictation number is [phone]

## 2018-10-29 ENCOUNTER — Other Ambulatory Visit: Payer: Self-pay | Admitting: Nephrology

## 2018-10-29 DIAGNOSIS — I8393 Asymptomatic varicose veins of bilateral lower extremities: Secondary | ICD-10-CM

## 2018-11-05 ENCOUNTER — Inpatient Hospital Stay: Admission: RE | Admit: 2018-11-05 | Payer: Medicare Other | Source: Ambulatory Visit

## 2018-11-05 ENCOUNTER — Other Ambulatory Visit: Payer: Self-pay | Admitting: Nephrology

## 2018-11-05 DIAGNOSIS — I8393 Asymptomatic varicose veins of bilateral lower extremities: Secondary | ICD-10-CM

## 2018-11-17 ENCOUNTER — Emergency Department (HOSPITAL_COMMUNITY): Payer: Medicare Other

## 2018-11-17 ENCOUNTER — Emergency Department (HOSPITAL_COMMUNITY)
Admission: EM | Admit: 2018-11-17 | Discharge: 2018-11-18 | Disposition: A | Discharge status: Home / Self Care | Payer: Medicare Other | Attending: Emergency Medicine | Admitting: Emergency Medicine

## 2018-11-17 ENCOUNTER — Other Ambulatory Visit: Payer: Self-pay

## 2018-11-17 ENCOUNTER — Encounter (HOSPITAL_COMMUNITY): Payer: Self-pay

## 2018-11-17 DIAGNOSIS — L0390 Cellulitis, unspecified: Secondary | ICD-10-CM

## 2018-11-17 DIAGNOSIS — L03116 Cellulitis of left lower limb: Secondary | ICD-10-CM | POA: Diagnosis not present

## 2018-11-17 DIAGNOSIS — R0602 Shortness of breath: Secondary | ICD-10-CM | POA: Diagnosis not present

## 2018-11-17 DIAGNOSIS — N189 Chronic kidney disease, unspecified: Secondary | ICD-10-CM | POA: Insufficient documentation

## 2018-11-17 DIAGNOSIS — I130 Hypertensive heart and chronic kidney disease with heart failure and stage 1 through stage 4 chronic kidney disease, or unspecified chronic kidney disease: Secondary | ICD-10-CM | POA: Insufficient documentation

## 2018-11-17 DIAGNOSIS — I509 Heart failure, unspecified: Secondary | ICD-10-CM | POA: Insufficient documentation

## 2018-11-17 DIAGNOSIS — R0789 Other chest pain: Secondary | ICD-10-CM | POA: Diagnosis present

## 2018-11-17 DIAGNOSIS — Z87891 Personal history of nicotine dependence: Secondary | ICD-10-CM | POA: Diagnosis not present

## 2018-11-17 DIAGNOSIS — I2510 Atherosclerotic heart disease of native coronary artery without angina pectoris: Secondary | ICD-10-CM | POA: Insufficient documentation

## 2018-11-17 DIAGNOSIS — R079 Chest pain, unspecified: Secondary | ICD-10-CM

## 2018-11-17 LAB — BASIC METABOLIC PANEL
Anion gap: 10 (ref 5–15)
BUN: 19 mg/dL (ref 8–23)
CO2: 19 mmol/L — ABNORMAL LOW (ref 22–32)
Calcium: 8.8 mg/dL — ABNORMAL LOW (ref 8.9–10.3)
Chloride: 112 mmol/L — ABNORMAL HIGH (ref 98–111)
Creatinine, Ser: 0.98 mg/dL (ref 0.44–1.00)
GFR calc Af Amer: >60 mL/min (ref >60)
GFR calc non Af Amer: 53 mL/min — ABNORMAL LOW (ref >60)
Glucose, Bld: 92 mg/dL (ref 70–99)
Potassium: 4.1 mmol/L (ref 3.5–5.1)
Sodium: 141 mmol/L (ref 135–145)

## 2018-11-17 LAB — CBC
HCT: 34.9 % — ABNORMAL LOW (ref 36.0–46.0)
Hemoglobin: 11.2 g/dL — ABNORMAL LOW (ref 12.0–15.0)
MCH: 28.7 pg (ref 26.0–34.0)
MCHC: 32.1 g/dL (ref 30.0–36.0)
MCV: 89.5 fL (ref 80.0–100.0)
Platelets: 208 10*3/uL (ref 150–400)
RBC: 3.9 MIL/uL (ref 3.87–5.11)
RDW: 14.6 % (ref 11.5–15.5)
WBC: 5 10*3/uL (ref 4.0–10.5)
nRBC: 0 % (ref 0.0–0.2)

## 2018-11-17 LAB — TROPONIN I (HIGH SENSITIVITY)
Troponin I (High Sensitivity): 6 ng/L (ref <18)
Troponin I (High Sensitivity): 6 ng/L (ref <18)

## 2018-11-17 MED ORDER — CEPHALEXIN 500 MG PO CAPS: 500.0000 mg | ORAL_CAPSULE | Freq: Two times a day (BID) | ORAL | 0 refills | Status: DC

## 2018-11-17 MED ORDER — FENTANYL CITRATE (PF) 100 MCG/2ML IJ SOLN
25.0000 ug | Freq: Once | INTRAMUSCULAR | Status: AC
  Administered 2018-11-17: 21:00:00 25 ug via INTRAVENOUS
  Filled 2018-11-17: qty 2

## 2018-11-17 MED ORDER — SULFAMETHOXAZOLE-TRIMETHOPRIM 800-160 MG PO TABS: 1.0000 | ORAL_TABLET | Freq: Two times a day (BID) | ORAL | 0 refills | Status: DC

## 2018-11-17 MED ORDER — SODIUM CHLORIDE 0.9% FLUSH: 3.0000 mL | Freq: Once | INTRAVENOUS | Status: DC

## 2018-11-17 MED ORDER — DIPHENHYDRAMINE HCL 50 MG/ML IJ SOLN
50.0000 mg | Freq: Once | INTRAMUSCULAR | Status: AC
  Administered 2018-11-17: 50 mg via INTRAVENOUS
  Filled 2018-11-17: qty 1

## 2018-11-17 MED ORDER — SULFAMETHOXAZOLE-TRIMETHOPRIM 800-160 MG PO TABS
1.0000 | ORAL_TABLET | Freq: Once | ORAL | Status: AC
  Administered 2018-11-17: 1 via ORAL
  Filled 2018-11-17: qty 1

## 2018-11-17 MED ORDER — METHYLPREDNISOLONE SODIUM SUCC 125 MG IJ SOLR
125.0000 mg | Freq: Once | INTRAMUSCULAR | Status: AC
  Administered 2018-11-17: 125 mg via INTRAVENOUS
  Filled 2018-11-17: qty 2

## 2018-11-17 MED ORDER — CEPHALEXIN 250 MG PO CAPS
500.0000 mg | ORAL_CAPSULE | Freq: Once | ORAL | Status: AC
  Administered 2018-11-17: 500 mg via ORAL
  Filled 2018-11-17: qty 2

## 2018-11-17 NOTE — Discharge Instructions (Addendum)
You were evaluated in the Emergency Department and after careful evaluation, we did not find any emergent condition requiring admission or further testing in the hospital.  Your CT scan of the chest did not show a blood clot in the lung or pneumonia.   Your symptoms seem to be due to a skin infection of the left foot.  Please take the antibiotics as directed and follow-up closely with your primary care doctor.  Please return to the Emergency Department if you experience any worsening of your condition.  We encourage you to follow up with a primary care provider.  Thank you for allowing [place] to be a part of your care.

## 2018-11-17 NOTE — ED Triage Notes (Signed)
Patient brought in by EMS with a chief complaint of chest pain. Patient states it started last night and has hurt like a dull ache intermittently. Patient had normal vital signs en route to the ER and an IV was not established. Patient also complains of shortness of breath with chest pain, but does not complain of it now.

## 2018-11-17 NOTE — ED Provider Notes (Signed)
[facility] Emergency Department Provider Note MRN:  [id number]  Arrival date & time: 11/17/18     Chief Complaint   Chest Pain   History of Present Illness   Katherine [name] is a 85 y.o. year-old female with a history of CAD, CHF presenting to the ED with chief complaint of chest pain.  2 days of sharp left-sided chest pain, constant, associated with shortness of breath.  Also with left leg pain and swelling.  Notes that the last time her leg felt like this was when she had a blood clot.  Denies fever, no cough, no abdominal pain.  Symptoms constant, no exacerbating relieving factors.  Review of Systems  A complete 10 system review of systems was obtained and all systems are negative except as noted in the HPI and PMH.   Patient's Health History    Past Medical History:  Diagnosis Date  . Anemia    mild  . Atrial fibrillation ([place])   . Bowel obstruction ([place]) 2015   PSBO  . Bowel obstruction ([place]) 10/2017  . CAD (coronary artery disease)   . Carotid artery stenosis   . CHF (congestive heart failure) ([place])   . Chronic kidney disease    renal insufficiency - DR Florene Glen yearly  . Diabetes mellitus without complication ([place])    type 2  . Full dentures   . History of hiatal hernia   . Hypertension   . Mobility impaired    uses walker for ambulation  . Stroke [facility])    right sided weakness,walker  . Wears glasses     Past Surgical History:  Procedure Laterality Date  . ABDOMINAL HYSTERECTOMY    . angiodysplasia    . APPENDECTOMY    . BREAST DUCTAL SYSTEM EXCISION  03/02/2012   Procedure: EXCISION DUCTAL SYSTEM BREAST;  Surgeon: Adin Hector, MD;  Location: [facility];  Service: General;  Laterality: Left;  . BREAST EXCISIONAL BIOPSY    . BREAST SURGERY     bx rt and lt   . CATARACT EXTRACTION     left  . CHOLECYSTECTOMY    . COLONOSCOPY N/A 10/15/2012   Procedure: COLONOSCOPY;  Surgeon: Beryle Beams, MD;  Location: [facility]  ENDOSCOPY;  Service: Endoscopy;  Laterality: N/A;  . DILATION AND CURETTAGE OF UTERUS    . DVT    . ENTEROSCOPY N/A 11/17/2014   Procedure: ENTEROSCOPY;  Surgeon: Carol Ada, MD;  Location: [facility] ENDOSCOPY;  Service: Endoscopy;  Laterality: N/A;  . ESOPHAGOGASTRODUODENOSCOPY N/A 09/10/2012   Procedure: ESOPHAGOGASTRODUODENOSCOPY (EGD);  Surgeon: Beryle Beams, MD;  Location: [facility];  Service: Endoscopy;  Laterality: N/A;  . EYE SURGERY     right-glaucoma  . HOT HEMOSTASIS N/A 09/10/2012   Procedure: HOT HEMOSTASIS (ARGON PLASMA COAGULATION/BICAP);  Surgeon: Beryle Beams, MD;  Location: [facility]OS[facility];  Service: Endoscopy;  Laterality: N/A;  . HOT HEMOSTASIS N/A 11/17/2014   Procedure: HOT HEMOSTASIS (ARGON PLASMA COAGULATION/BICAP);  Surgeon: Carol Ada, MD;  Location: [facility]OS[facility];  Service: Endoscopy;  Laterality: N/A;  . TUBAL LIGATION      Family History  Problem Relation Age of Onset  . Heart disease Sister   . Heart disease Brother   . Heart disease Mother   . Heart disease Father   . Heart disease Daughter     Social History   Socioeconomic History  . Marital status: Widowed    Spouse name: Not on file  . Number of children: Not on  file  . Years of education: Not on file  . Highest education level: Not on file  Occupational History  . Not on file  Social Needs  . Financial resource strain: Not on file  . Food insecurity    Worry: Not on file    Inability: Not on file  . Transportation needs    Medical: Not on file    Non-medical: Not on file  Tobacco Use  . Smoking status: Former Smoker    Quit date: 02/25/2002    Years since quitting: 16.7  . Smokeless tobacco: Never Used  Substance and Sexual Activity  . Alcohol use: No  . Drug use: No  . Sexual activity: Not Currently  Lifestyle  . Physical activity    Days per week: Not on file    Minutes per session: Not on file  . Stress: Not on file  Relationships  . Social Herbalist on phone: Not  on file    Gets together: Not on file    Attends religious service: Not on file    Active member of club or organization: Not on file    Attends meetings of clubs or organizations: Not on file    Relationship status: Not on file  . Intimate partner violence    Fear of current or ex partner: Not on file    Emotionally abused: Not on file    Physically abused: Not on file    Forced sexual activity: Not on file  Other Topics Concern  . Not on file  Social History Narrative  . Not on file     Physical Exam  Vital Signs and Nursing Notes reviewed Vitals:   11/17/18 1947 11/17/18 2144  BP: 124/89 (!) 157/78  Pulse: (!) 55 65  Resp: 14 14  Temp: 97.9 F (36.6 C) 98 F (36.7 C)  SpO2: 100% 100%    CONSTITUTIONAL: Well-appearing, NAD NEURO:  Alert and oriented x 3, no focal deficits EYES:  eyes equal and reactive ENT/NECK:  no LAD, no JVD CARDIO: Regular rate, well-perfused, normal S1 and S2 PULM:  CTAB no wheezing or rhonchi GI/GU:  normal bowel sounds, non-distended, non-tender MSK/SPINE:  No gross deformities; asymmetric edema most prominent at the left ankle and foot, increased warmth SKIN:  no rash, atraumatic PSYCH:  Appropriate speech and behavior  Diagnostic and Interventional Summary    EKG Interpretation  Date/Time:  Wednesday November 17 2018 18:57:08 EDT Ventricular Rate:  55 PR Interval:    QRS Duration: 82 QT Interval:  424 QTC Calculation: 406 R Axis:   2 Text Interpretation:  Sinus rhythm Low voltage, precordial leads Confirmed by Gerlene Fee [phone]) on 11/17/2018 7:12:41 PM      Labs Reviewed  BASIC METABOLIC PANEL - Abnormal; Notable for the following components:      Result Value   Chloride 112 (*)    CO2 19 (*)    Calcium 8.8 (*)    GFR calc non Af Amer 53 (*)    All other components within normal limits  CBC - Abnormal; Notable for the following components:   Hemoglobin 11.2 (*)    HCT 34.9 (*)    All other components within normal  limits  TROPONIN I (HIGH SENSITIVITY)  TROPONIN I (HIGH SENSITIVITY)    DG Foot Complete Left  Final Result    DG Ankle Complete Left  Final Result    CT ANGIO CHEST PE W OR WO CONTRAST    (Results Pending)  Medications  sodium chloride flush (NS) 0.9 % injection 3 mL (has no administration in time range)  diphenhydrAMINE (BENADRYL) injection 50 mg (has no administration in time range)  cephALEXin (KEFLEX) capsule 500 mg (has no administration in time range)  sulfamethoxazole-trimethoprim (BACTRIM DS) 800-160 MG per tablet 1 tablet (has no administration in time range)  fentaNYL (SUBLIMAZE) injection 25 mcg (25 mcg Intravenous Given 11/17/18 2031)  methylPREDNISolone sodium succinate (SOLU-MEDROL) 125 mg/2 mL injection 125 mg (125 mg Intravenous Given 11/17/18 2035)     Ultrasound ED Soft Tissue  Date/Time: 11/17/2018 10:07 PM Performed by: Maudie Flakes, MD Authorized by: Maudie Flakes, MD   Procedure details:    Indications: limb pain     Transverse view:  Visualized   Longitudinal view:  Visualized   Images: archived   Location:    Location comment:  Left foot and ankle Comments:     No abscess, no joint effusion.  Diffuse cobblestoning.    EMERGENCY DEPARTMENT US EXTREMITY EXAM "Study:  Limited Duplex of bilateral lower Extremity Veins"  INDICATIONS: Leg swelling Visualization of  regions in transverse plane with full compression visualized.   PERFORMED BY: Myself IMAGES ARCHIVED?: Yes VIEWS USED: Saphenous-femoral junction, Proximal femoral vein and Popliteal vein INTERPRETATION: No DVT visualized   Critical Care  ED Course and Medical Decision Making  I have reviewed the triage vital signs and the nursing notes.  Pertinent labs & imaging results that were available during my care of the patient were reviewed by me and considered in my medical decision making (see below for details).  Concern for VTE in this 85 year old female with reported history of  the same.  Patient has contrast allergy, will pretreat with Solu-Medrol and Benadryl per protocol.  Also considering ACS, leg does not appear to be infected, normal range of motion of the joints, nothing to suggest septic joint, patient denies trauma but will x-ray to exclude fracture.  Bedside DVT ultrasound shows no evidence of DVT bilaterally.  Ultrasound of the swollen foot/ankle reveals diffuse cobblestoning.  There is increased warmth, she has a darker skin pigmentation but there may be a component of erythema as well.  Further considering cellulitis is a possibility.  Patient still needs CTA of the chest to exclude pulmonary embolism.  Troponins are negative x2.  She is without fever or signs of systemic illness.  She is a candidate for discharge with oral antibiotics and close PCP follow-up so long a CTA is without acute issue.  Signed out to oncoming provider at shift change.  Barth Kirks. Sedonia Small, [place] [email or web address]  Final Clinical Impressions(s) / ED Diagnoses     ICD-10-CM   1. Cellulitis, unspecified cellulitis site  L03.90   2. Chest pain, unspecified type  R07.9     ED Discharge Orders         Ordered    cephALEXin (KEFLEX) 500 MG capsule  2 times daily     11/17/18 2255    sulfamethoxazole-trimethoprim (BACTRIM DS) 800-160 MG tablet  2 times daily     11/17/18 2255          Discharge Instructions Discussed with and Provided to Patient:    Maudie Flakes, MD 11/17/18 2259

## 2018-11-17 NOTE — ED Notes (Signed)
Patient transported to X-ray 

## 2018-11-18 ENCOUNTER — Emergency Department (HOSPITAL_COMMUNITY): Payer: Medicare Other

## 2018-11-18 DIAGNOSIS — R0789 Other chest pain: Secondary | ICD-10-CM | POA: Diagnosis not present

## 2018-11-18 MED ORDER — SULFAMETHOXAZOLE-TRIMETHOPRIM 800-160 MG PO TABS: 1.0000 | ORAL_TABLET | Freq: Two times a day (BID) | ORAL | 0 refills | Status: AC

## 2018-11-18 MED ORDER — IOHEXOL 350 MG/ML SOLN
75.0000 mL | Freq: Once | INTRAVENOUS | Status: AC | PRN
  Administered 2018-11-18: 01:00:00 75 mL via INTRAVENOUS

## 2018-11-18 MED ORDER — CEPHALEXIN 500 MG PO CAPS: 500.0000 mg | ORAL_CAPSULE | Freq: Two times a day (BID) | ORAL | 0 refills | Status: AC

## 2018-11-18 NOTE — ED Provider Notes (Signed)
I received the patient in signout from Dr. Sedonia Small.  Briefly the patient is 85 year old female with atypical chest pain and leg swelling.  Plan is for CT scan of the chest.  Unfortunately the patient has a contrast dye allergy and she is awaiting pretreatment with steroids and Benadryl.  CT has returned and is negative.  Troponins are negative.  Will treat as cellulitis.  PCP follow-up.   Deno Etienne, DO 11/18/18 0105

## 2018-11-25 ENCOUNTER — Ambulatory Visit
Admission: RE | Admit: 2018-11-25 | Discharge: 2018-11-25 | Disposition: A | Discharge status: Home / Self Care | Payer: Medicare Other | Source: Ambulatory Visit | Attending: Nephrology | Admitting: Nephrology

## 2018-11-25 DIAGNOSIS — I8393 Asymptomatic varicose veins of bilateral lower extremities: Secondary | ICD-10-CM

## 2018-11-25 NOTE — Consult Note (Signed)
Chief Complaint: Patient was seen in consultation today for left lower leg swelling at the request of Gross  Referring Physician(s): Upton,Elizabeth  History of Present Illness: Katherine [name] is a 85 y.o. female complains of left ankle swelling, bilateral lower extremity pain and skin color changes.  No visible varicose veins identified.  She has had no previous treatment for varicose or spider veins.  She does describe a history of venous thrombus in the posterior popliteal region 3-4 years ago.  No family history of varicose or spider veins.  She does not currently wear support hose.  She is not currently working or required to sit or stand for long periods of time.  She does not require pain medications for her symptoms.  She finds that leg elevation does sometimes relieve the symptoms using a recliner.  Past Medical History:  Diagnosis Date  . Anemia    mild  . Atrial fibrillation ([place])   . Bowel obstruction ([place]) 2015   PSBO  . Bowel obstruction ([place]) 10/2017  . CAD (coronary artery disease)   . Carotid artery stenosis   . CHF (congestive heart failure) ([place])   . Chronic kidney disease    renal insufficiency - DR Florene Glen yearly  . Diabetes mellitus without complication ([place])    type 2  . Full dentures   . History of hiatal hernia   . Hypertension   . Mobility impaired    uses walker for ambulation  . Stroke [facility])    right sided weakness,walker  . Wears glasses     Past Surgical History:  Procedure Laterality Date  . ABDOMINAL HYSTERECTOMY    . angiodysplasia    . APPENDECTOMY    . BREAST DUCTAL SYSTEM EXCISION  03/02/2012   Procedure: EXCISION DUCTAL SYSTEM BREAST;  Surgeon: Adin Hector, MD;  Location: [place];  Service: General;  Laterality: Left;  . BREAST EXCISIONAL BIOPSY    . BREAST SURGERY     bx rt and lt   . CATARACT EXTRACTION     left  . CHOLECYSTECTOMY    . COLONOSCOPY N/A 10/15/2012   Procedure: COLONOSCOPY;   Surgeon: Beryle Beams, MD;  Location: [facility];  Service: Endoscopy;  Laterality: N/A;  . DILATION AND CURETTAGE OF UTERUS    . DVT    . ENTEROSCOPY N/A 11/17/2014   Procedure: ENTEROSCOPY;  Surgeon: Carol Ada, MD;  Location: [facility];  Service: Endoscopy;  Laterality: N/A;  . ESOPHAGOGASTRODUODENOSCOPY N/A 09/10/2012   Procedure: ESOPHAGOGASTRODUODENOSCOPY (EGD);  Surgeon: Beryle Beams, MD;  Location: [facility];  Service: Endoscopy;  Laterality: N/A;  . EYE SURGERY     right-glaucoma  . HOT HEMOSTASIS N/A 09/10/2012   Procedure: HOT HEMOSTASIS (ARGON PLASMA COAGULATION/BICAP);  Surgeon: Beryle Beams, MD;  Location: [facility];  Service: Endoscopy;  Laterality: N/A;  . HOT HEMOSTASIS N/A 11/17/2014   Procedure: HOT HEMOSTASIS (ARGON PLASMA COAGULATION/BICAP);  Surgeon: Carol Ada, MD;  Location: [facility];  Service: Endoscopy;  Laterality: N/A;  . TUBAL LIGATION      Allergies: Aspirin, Blue dyes (parenteral), Ibuprofen, Contrast media [iodinated diagnostic agents], Iohexol, and Shellfish-derived products  Medications: Prior to Admission medications   Medication Sig Start Date End Date Taking? Authorizing Provider  Accu-Chek FastClix Lancets MISC daily. 10/18/18   [provider]  ACCU-CHEK GUIDE test strip 1 strip daily. 10/18/18   [provider]  amLODipine-valsartan (EXFORGE) 5-320 MG tablet Take 1 tablet by mouth daily.  01/14/16  [provider]  atorvastatin (LIPITOR) 40 MG tablet Take 40 mg by mouth daily.    [provider]  bimatoprost (LUMIGAN) 0.01 % SOLN Place 1 drop into both eyes at bedtime.    [provider]  Blood Glucose Monitoring Suppl (ACCU-CHEK GUIDE) w/Device KIT daily.  10/18/18   [provider]  brimonidine (ALPHAGAN P) 0.1 % SOLN Place 1 drop into both eyes 3 (three) times daily.     [provider]  cephALEXin (KEFLEX) 500 MG capsule Take 1 capsule (500 mg total) by mouth 2  (two) times daily for 7 days. 11/18/18 11/25/18  Deno Etienne, DO  clopidogrel (PLAVIX) 75 MG tablet Take 1 tablet (75 mg total) by mouth daily. 06/04/17   [name], MD  dicyclomine (BENTYL) 10 MG capsule Take 1 capsule by mouth 3 (three) times daily. 08/09/18   [provider]  dorzolamide (TRUSOPT) 2 % ophthalmic solution Place 1 drop into both eyes daily. 08/09/18   [provider]  ergocalciferol (VITAMIN D2) 50000 units capsule Take 50,000 Units by mouth every Saturday.     [provider]  fentaNYL (DURAGESIC) 12 MCG/HR Place 1 patch onto the skin every 3 (three) days. 11/10/18   [provider]  ferrous gluconate (FERGON) 324 MG tablet Take 324 mg by mouth daily. 10/26/18   [provider]  insulin aspart protamine- aspart (NOVOLOG MIX 70/30) (70-30) 100 UNIT/ML injection Inject 0.2 mLs (20 Units total) into the skin 2 (two) times daily with a meal. Inject 20 units subcutaneously every morning and 10 units at night Patient taking differently: Inject 10-20 Units into the skin 2 (two) times daily with a meal. Inject 20 units in the morning and 10 units at night 09/10/18   [name], MD  loperamide (IMODIUM) 2 MG capsule Take 2 mg by mouth as needed for diarrhea or loose stools.    [provider]  metoprolol tartrate (LOPRESSOR) 25 MG tablet Take 0.5 tablets (12.5 mg total) by mouth 2 (two) times daily. 09/10/18   [name], MD  nitroGLYCERIN (NITRODUR - DOSED IN MG/24 HR) 0.4 mg/hr patch Place 0.4 mg onto the skin daily.  01/21/16   [provider]  nitroGLYCERIN (NITROSTAT) 0.4 MG SL tablet Place 1 tablet (0.4 mg total) under the tongue every 5 (five) minutes as needed for chest pain (CP or SOB). 12/23/15   [name], MD  omeprazole (PRILOSEC) 40 MG capsule Take 40 mg by mouth daily.     [provider]  potassium chloride SA (K-DUR,KLOR-CON) 20 MEQ tablet Take 0.5 tablets (10 mEq total) by mouth daily. 11/07/17    Dixie Dials, MD  RHOPRESSA 0.02 % SOLN Place 1 drop into both eyes at bedtime. 08/08/18   [provider]  sulfamethoxazole-trimethoprim (BACTRIM DS) 800-160 MG tablet Take 1 tablet by mouth 2 (two) times daily for 7 days. 11/18/18 11/25/18  Deno Etienne, DO  torsemide (DEMADEX) 100 MG tablet Take 100 mg by mouth daily. 10/20/18   [provider]     Family History  Problem Relation Age of Onset  . Heart disease Sister   . Heart disease Brother   . Heart disease Mother   . Heart disease Father   . Heart disease Daughter     Social History   Socioeconomic History  . Marital status: Widowed    Spouse name: Not on file  . Number of children: Not on file  . Years of education: Not on file  .  Highest education level: Not on file  Occupational History  . Not on file  Social Needs  . Financial resource strain: Not on file  . Food insecurity    Worry: Not on file    Inability: Not on file  . Transportation needs    Medical: Not on file    Non-medical: Not on file  Tobacco Use  . Smoking status: Former Smoker    Quit date: 02/25/2002    Years since quitting: 16.7  . Smokeless tobacco: Never Used  Substance and Sexual Activity  . Alcohol use: No  . Drug use: No  . Sexual activity: Not Currently  Lifestyle  . Physical activity    Days per week: Not on file    Minutes per session: Not on file  . Stress: Not on file  Relationships  . Social Herbalist on phone: Not on file    Gets together: Not on file    Attends religious service: Not on file    Active member of club or organization: Not on file    Attends meetings of clubs or organizations: Not on file    Relationship status: Not on file  Other Topics Concern  . Not on file  Social History Narrative  . Not on file    ECOG Status: 1 - Symptomatic but completely ambulatory    Physical Exam Constitutional: Oriented to person, place, and time. Well-developed and well-nourished. No distress.   Vital Signs: BP (!) 149/69 (BP Location: Right Arm)   Pulse 65   Temp 98.1 F (36.7 C)   SpO2 100%   HENT:  Head: Normocephalic and atraumatic.  Eyes: Conjunctivae and EOM are normal. Right eye exhibits no discharge. Left eye exhibits no discharge. No scleral icterus.  Neck: No JVD present.  Pulmonary/Chest: Effort normal. No stridor. No respiratory distress.  Abdomen: soft, non distended Neurological:  alert and oriented to person, place, and time.  Skin: Skin is warm and dry.  not diaphoretic.  Psychiatric:   normal mood and affect.   behavior is normal. Judgment and thought content normal.  Extremities: 1+ left ankle edema Mallampati Score: Review of Systems Review of Systems: A 12 point ROS discussed and pertinent positives are indicated in the HPI above.  All other systems are negative.      Imaging: Dg Ankle Complete Left  Result Date: 11/17/2018 CLINICAL DATA:  Ankle pain, no known injury, initial encounter EXAM: LEFT ANKLE COMPLETE - 3+ VIEW COMPARISON:  None. FINDINGS: Soft tissue swelling is noted about the ankle particularly laterally. No acute fracture or dislocation is seen. Calcaneal spurring is noted. IMPRESSION: Considerable soft tissue swelling without acute bony abnormality. Electronically Signed   By: Inez Catalina M.D.   On: 11/17/2018 20:17   Ct Angio Chest Pe W Or Wo Contrast  Result Date: 11/18/2018 CLINICAL DATA:  Chest pain, shortness of breath EXAM: CT ANGIOGRAPHY CHEST WITH CONTRAST TECHNIQUE: Multidetector CT imaging of the chest was performed using the standard protocol during bolus administration of intravenous contrast. Multiplanar CT image reconstructions and MIPs were obtained to evaluate the vascular anatomy. CONTRAST:  66 mL OMNIPAQUE IOHEXOL 350 MG/ML SOLN COMPARISON:  Chest radiograph August 06, 2010, esophagram January 11, 2013 FINDINGS: Cardiovascular: Satisfactory opacification of the pulmonary arteries to the segmental level. More distal  subsegmental evaluation is limited by respiratory motion artifact. No central, lobar or segmental filling defects are identified. Atherosclerotic plaque within the normal caliber aorta. Additional calcifications are seen in the proximal  great vessels. Atherosclerotic calcification of the coronary arteries. Normal heart size. No pericardial effusion. Mediastinum/Nodes: No enlarged mediastinal, hilar or axillary lymph nodes. Thyroid gland and thoracic inlet are unremarkable. Trachea is free of abnormality. Small hiatal hernia. Lungs/Pleura: Dependent atelectasis is present posteriorly. There is mild airways thickening with few patchy areas of peribronchial ground-glass opacity. Bandlike areas of opacity are present in the lung bases and likely reflect atelectasis and/or scarring. No pneumothorax or effusion. No suspicious pulmonary nodules or masses. Upper Abdomen: Patient is post cholecystectomy No acute abnormalities present in the visualized portions of the upper abdomen. Musculoskeletal: Multilevel degenerative changes are present in the imaged portions of the spine. Exaggerated thoracic kyphotic curvature. Diffusely demineralized appearance of the osseous structures. No suspicious osseous lesions. Review of the MIP images confirms the above findings. IMPRESSION: 1. No evidence of central, lobar or segmental pulmonary embolism. More distal subsegmental evaluation is limited by respiratory motion artifact. 2. Mild airways thickening with few patchy areas of peribronchial ground-glass opacity, suggestive of infectious or inflammatory bronchiolitis. 3. Aortic Atherosclerosis (ICD10-I70.0). 4. Extensive coronary arterial calcifications. Electronically Signed   By: Lovena Le M.D.   On: 11/18/2018 01:01   Dg Foot Complete Left  Result Date: 11/17/2018 CLINICAL DATA:  Left foot pain, no known injury, initial encounter EXAM: LEFT FOOT - COMPLETE 3+ VIEW COMPARISON:  None. FINDINGS: Mild osteopenia is noted. No  acute fracture or dislocation is noted. Generalized soft tissue swelling along the metatarsals is seen. Calcaneal spurring is noted. Soft tissue swelling is noted laterally as well. IMPRESSION: Considerable soft tissue changes without acute bony abnormality. Electronically Signed   By: Inez Catalina M.D.   On: 11/17/2018 20:17   EXAM: BILATERAL LOWER EXTREMITY VENOUS DUPLEX ULTRASOUND  TECHNIQUE: Gray-scale sonography with graded compression, as well as color Doppler and duplex ultrasound, were performed to evaluate the deep veins of bilateral lower extremities, and the left lower extremity superficial veins . Spectral Doppler was utilized to evaluate flow at rest and with distal augmentation maneuvers. A complete superficial venous insufficiency exam was performed in the upright standing position. I personally performed the technical portion of the exam.  COMPARISON: None.  FINDINGS: Deep Venous System:  Evaluation of the deep venous systems including the common femoral, femoral, profunda femoral, popliteal and calf veins (where visible) demonstrate no evidence of deep venous thrombosis. The vessels are compressible and demonstrate normal respiratory phasicity and response to augmentation. No evidence of the deep venous reflux.  Superficial Venous System:  LEFT  SFJ: Patent, no reflux post augmentation  GSV Prox Thigh: Negative  GSV Mid Thigh: Negative  GSV Lower Thigh: Negative  GSV at Knee: Negative  GSV Prox Calf: Negative  GSV Mid Calf: Negative  GSV Distal Calf: Negative. There is a deep perforator in the posterior distal calf which shows 1 second of mild reflux post augmentation.  SPJ: Not visualized  SSV Prox: 5 mm diameter, greater than 4 seconds reflux post augmentation  SSV Mid: 4 mm, less than 2 seconds reflux post augmentation  SSV Distal: Duplicated, no reflux  Other: None  IMPRESSION: 1. Normal bilateral lower extremity deep venous systems. No  evidence of acute or chronic DVT. 2. Limited segmental reflux in the proximal left short saphenous vein. Otherwise negative saphenous venous system of the symptomatic left lower extremity.     Labs:  CBC: Recent Labs    09/06/18 0639 09/08/18 0414 09/09/18 0523 11/17/18 1902  WBC 5.3 4.7 4.4 5.0  HGB 10.6* 9.3* 9.3* 11.2*  HCT 34.0* 29.4* 29.5* 34.9*  PLT 242 202 195 208    COAGS: No results for input(s): INR, APTT in the last 8760 hours.  BMP: Recent Labs    09/06/18 0639 09/08/18 0414 09/09/18 0523 11/17/18 1902  NA 152* 144 143 141  K 3.6 3.6 3.8 4.1  CL 119* 116* 115* 112*  CO2 23 22 21* 19*  GLUCOSE 164* 144* 119* 92  BUN 17 5* <5* 19  CALCIUM 9.2 8.4* 8.6* 8.8*  CREATININE 0.94 0.84 0.82 0.98  GFRNONAA 55* >60 >60 53*  GFRAA >60 >60 >60 >60    LIVER FUNCTION TESTS: Recent Labs    09/04/18 1630  BILITOT 0.9  AST 30  ALT 14  ALKPHOS 82  PROT 8.3*  ALBUMIN 4.3    TUMOR MARKERS: No results for input(s): AFPTM, CEA, CA199, CHROMGRNA in the last 8760 hours.  Assessment and Plan:  My impression is that this patient has minimal segmental valvular incompetence and reflux in the proximal left short saphenous vein which may contribute to her left ankle swelling.  The remainder of the saphenous venous system of the left lower extremity is normal.  Deep venous systems are normal bilaterally.  I discussed with the patient the pathophysiology of superficial venous valvular incompetence and reflux, and progression to varicose veins and clinical symptomatology.  We discussed treatment options including watchful waiting, compression hose, and ablation.  I think her symptoms are relatively mild with no skin breakdown or trophic changes, and with   limited reflux, I would start with graduated compression hose of at least knee-high 20-30 mmHg.  I gave her a prescription for these.  If after trying these for 3 months she finds the symptoms are inadequately  controlled, we could revisit the possibility of venous ablation although this will be somewhat complicated by the fact that she is on long-term anticoagulation which will need to be held for several weeks.  Thank you for this interesting consult.  I greatly enjoyed meeting Katherine [name] and look forward to participating in their care.  A copy of this report was sent to the requesting provider on this date.  Electronically Signed: Rickard Rhymes 11/25/2018, 11:57 AM   I spent a total of  40 Minutes   in face to face in clinical consultation, greater than 50% of which was counseling/coordinating care for left ankle swelling.

## 2019-03-07 ENCOUNTER — Other Ambulatory Visit: Payer: Self-pay

## 2019-03-07 ENCOUNTER — Encounter (HOSPITAL_COMMUNITY): Payer: Self-pay | Admitting: Emergency Medicine

## 2019-03-07 ENCOUNTER — Emergency Department (HOSPITAL_COMMUNITY)
Admission: EM | Admit: 2019-03-07 | Discharge: 2019-03-08 | Disposition: A | Discharge status: Home / Self Care | Payer: Medicare Other | Attending: Emergency Medicine | Admitting: Emergency Medicine

## 2019-03-07 DIAGNOSIS — I130 Hypertensive heart and chronic kidney disease with heart failure and stage 1 through stage 4 chronic kidney disease, or unspecified chronic kidney disease: Secondary | ICD-10-CM | POA: Insufficient documentation

## 2019-03-07 DIAGNOSIS — Z87891 Personal history of nicotine dependence: Secondary | ICD-10-CM | POA: Insufficient documentation

## 2019-03-07 DIAGNOSIS — K921 Melena: Secondary | ICD-10-CM | POA: Diagnosis present

## 2019-03-07 DIAGNOSIS — N189 Chronic kidney disease, unspecified: Secondary | ICD-10-CM | POA: Diagnosis not present

## 2019-03-07 DIAGNOSIS — N3090 Cystitis, unspecified without hematuria: Secondary | ICD-10-CM | POA: Diagnosis not present

## 2019-03-07 DIAGNOSIS — Z794 Long term (current) use of insulin: Secondary | ICD-10-CM | POA: Insufficient documentation

## 2019-03-07 DIAGNOSIS — I509 Heart failure, unspecified: Secondary | ICD-10-CM | POA: Diagnosis not present

## 2019-03-07 DIAGNOSIS — E1122 Type 2 diabetes mellitus with diabetic chronic kidney disease: Secondary | ICD-10-CM | POA: Insufficient documentation

## 2019-03-07 DIAGNOSIS — N179 Acute kidney failure, unspecified: Secondary | ICD-10-CM

## 2019-03-07 DIAGNOSIS — Z79899 Other long term (current) drug therapy: Secondary | ICD-10-CM | POA: Diagnosis not present

## 2019-03-07 DIAGNOSIS — I2510 Atherosclerotic heart disease of native coronary artery without angina pectoris: Secondary | ICD-10-CM | POA: Diagnosis not present

## 2019-03-07 LAB — COMPREHENSIVE METABOLIC PANEL
ALT: 12 U/L (ref 0–44)
AST: 17 U/L (ref 15–41)
Albumin: 3.8 g/dL (ref 3.5–5.0)
Alkaline Phosphatase: 73 U/L (ref 38–126)
Anion gap: 10 (ref 5–15)
BUN: 44 mg/dL — ABNORMAL HIGH (ref 8–23)
CO2: 20 mmol/L — ABNORMAL LOW (ref 22–32)
Calcium: 9.6 mg/dL (ref 8.9–10.3)
Chloride: 108 mmol/L (ref 98–111)
Creatinine, Ser: 1.67 mg/dL — ABNORMAL HIGH (ref 0.44–1.00)
GFR calc Af Amer: 32 mL/min — ABNORMAL LOW (ref >60)
GFR calc non Af Amer: 27 mL/min — ABNORMAL LOW (ref >60)
Glucose, Bld: 169 mg/dL — ABNORMAL HIGH (ref 70–99)
Potassium: 5.2 mmol/L — ABNORMAL HIGH (ref 3.5–5.1)
Sodium: 138 mmol/L (ref 135–145)
Total Bilirubin: 0.3 mg/dL (ref 0.3–1.2)
Total Protein: 7.2 g/dL (ref 6.5–8.1)

## 2019-03-07 LAB — CBC
HCT: 39.4 % (ref 36.0–46.0)
Hemoglobin: 12.1 g/dL (ref 12.0–15.0)
MCH: 27.8 pg (ref 26.0–34.0)
MCHC: 30.7 g/dL (ref 30.0–36.0)
MCV: 90.4 fL (ref 80.0–100.0)
Platelets: 257 10*3/uL (ref 150–400)
RBC: 4.36 MIL/uL (ref 3.87–5.11)
RDW: 14.8 % (ref 11.5–15.5)
WBC: 5 10*3/uL (ref 4.0–10.5)
nRBC: 0 % (ref 0.0–0.2)

## 2019-03-07 LAB — TYPE AND SCREEN
ABO/RH(D): O POS
Antibody Screen: NEGATIVE

## 2019-03-07 LAB — ABO/RH: ABO/RH(D): O POS

## 2019-03-07 NOTE — ED Notes (Signed)
Please inform pt her daughter Simona Huh is calling checking on her

## 2019-03-07 NOTE — ED Triage Notes (Signed)
Pt BIB [facility] from home, pt reports large, dark stool on Saturday with nausea and RLQ pain. Hx blood transfusions. Pain has increased since Saturday. Hx blood transfusions. Given 4mg  zofran by EMS. EMS VS: B 131/64, HR 53, RR 16, SpO2 100%ra, CBG 232.

## 2019-03-08 ENCOUNTER — Emergency Department (HOSPITAL_COMMUNITY): Payer: Medicare Other

## 2019-03-08 DIAGNOSIS — N3090 Cystitis, unspecified without hematuria: Secondary | ICD-10-CM | POA: Diagnosis not present

## 2019-03-08 LAB — POC OCCULT BLOOD, ED: Fecal Occult Bld: NEGATIVE

## 2019-03-08 MED ORDER — ONDANSETRON HCL 4 MG/2ML IJ SOLN
4.0000 mg | Freq: Once | INTRAMUSCULAR | Status: AC
  Administered 2019-03-08: 4 mg via INTRAVENOUS
  Filled 2019-03-08: qty 2

## 2019-03-08 MED ORDER — SODIUM CHLORIDE 0.9 % IV BOLUS
500.0000 mL | Freq: Once | INTRAVENOUS | Status: AC
  Administered 2019-03-08: 500 mL via INTRAVENOUS

## 2019-03-08 MED ORDER — CEPHALEXIN 500 MG PO CAPS: 500.0000 mg | ORAL_CAPSULE | Freq: Four times a day (QID) | ORAL | 0 refills | Status: DC

## 2019-03-08 MED ORDER — MORPHINE SULFATE (PF) 4 MG/ML IV SOLN
4.0000 mg | Freq: Once | INTRAVENOUS | Status: AC
  Administered 2019-03-08: 4 mg via INTRAVENOUS
  Filled 2019-03-08: qty 1

## 2019-03-08 NOTE — ED Notes (Signed)
Esignature pad not available. Pt understands d/c instructions and prescription. Pt's niece is picking her up. Pt has been in contact with family to keep them updated.

## 2019-03-08 NOTE — ED Provider Notes (Signed)
[facility]ERGENCY DEPARTMENT Provider Note   CSN: [id number] Arrival date & time: 03/07/19  1609     History Chief Complaint  Patient presents with  . GI Bleeding    Katherine [name] is a 86 y.o. female with PMH significant for SBO, multiple abdominal surgeries, type II DM, HTN, DVT, CVA, CAD, CHF, who presents to the ED via EMS for RLQ pain and concern for a GI bleed.  Patient reportedly had an episode of nonbloody emesis 3 days ago and a large loose dark black stool 2 days ago.  Since then, her nausea has persisted and she denies any significant p.o. intake.  She endorses an aching pain in her RLQ that she suspects to be an obstruction.  She also endorses chest pain, but no diaphoresis or shortness of breath.  She reports that Dr. Terrence Dupont, her cardiologist, encouraged her to come into the ER today for evaluation.  She denies any recent illness, fevers or chills, shortness of breath, diaphoresis, or pelvic pain. Patient is a relatively poor historian.   HPI     Past Medical History:  Diagnosis Date  . Anemia    mild  . Atrial fibrillation ([place])   . Bowel obstruction ([place]) 2015   PSBO  . Bowel obstruction ([place]) 10/2017  . CAD (coronary artery disease)   . Carotid artery stenosis   . CHF (congestive heart failure) ([place])   . Chronic kidney disease    renal insufficiency - DR Florene Glen yearly  . Diabetes mellitus without complication ([place])    type 2  . Full dentures   . History of hiatal hernia   . Hypertension   . Mobility impaired    uses walker for ambulation  . Stroke [facility])    right sided weakness,walker  . Wears glasses     Patient Active Problem List   Diagnosis Date Noted  . Small bowel obstruction ([place]) 10/31/2017  . Abdominal pain   . Nausea and vomiting   . Acute colitis 05/30/2017  . Chest pain 12/21/2015  . ACS (acute coronary syndrome) ([place]) 12/21/2015  . Bowel obstruction ([place]) 10/15/2013  . Angioedema due to seafood allergy 09/11/2013    . Nipple discharge, bloody 01/07/2012    Past Surgical History:  Procedure Laterality Date  . ABDOMINAL HYSTERECTOMY    . angiodysplasia    . APPENDECTOMY    . BREAST DUCTAL SYSTEM EXCISION  03/02/2012   Procedure: EXCISION DUCTAL SYSTEM BREAST;  Surgeon: Adin Hector, MD;  Location: [facility];  Service: General;  Laterality: Left;  . BREAST EXCISIONAL BIOPSY    . BREAST SURGERY     bx rt and lt   . CATARACT EXTRACTION     left  . CHOLECYSTECTOMY    . COLONOSCOPY N/A 10/15/2012   Procedure: COLONOSCOPY;  Surgeon: Beryle Beams, MD;  Location: [facility];  Service: Endoscopy;  Laterality: N/A;  . DILATION AND CURETTAGE OF UTERUS    . DVT    . ENTEROSCOPY N/A 11/17/2014   Procedure: ENTEROSCOPY;  Surgeon: Carol Ada, MD;  Location: [facility];  Service: Endoscopy;  Laterality: N/A;  . ESOPHAGOGASTRODUODENOSCOPY N/A 09/10/2012   Procedure: ESOPHAGOGASTRODUODENOSCOPY (EGD);  Surgeon: Beryle Beams, MD;  Location: [facility];  Service: Endoscopy;  Laterality: N/A;  . EYE SURGERY     right-glaucoma  . HOT HEMOSTASIS N/A 09/10/2012   Procedure: HOT HEMOSTASIS (ARGON PLASMA COAGULATION/BICAP);  Surgeon: Beryle Beams, MD;  Location: [facility];  Service: Endoscopy;  Laterality: N/A;  . HOT HEMOSTASIS N/A 11/17/2014   Procedure: HOT HEMOSTASIS (ARGON PLASMA COAGULATION/BICAP);  Surgeon: Carol Ada, MD;  Location: [facility];  Service: Endoscopy;  Laterality: N/A;  . TUBAL LIGATION       OB History   No obstetric history on file.     Family History  Problem Relation Age of Onset  . Heart disease Sister   . Heart disease Brother   . Heart disease Mother   . Heart disease Father   . Heart disease Daughter     Social History   Tobacco Use  . Smoking status: Former Smoker    Quit date: 02/25/2002    Years since quitting: 17.0  . Smokeless tobacco: Never Used  Substance Use Topics  . Alcohol use: No  . Drug use: No    Home  Medications Prior to Admission medications   Medication Sig Start Date End Date Taking? Authorizing Provider  Accu-Chek FastClix Lancets MISC daily. 10/18/18   [provider]  ACCU-CHEK GUIDE test strip 1 strip daily. 10/18/18   [provider]  amLODipine-valsartan (EXFORGE) 5-320 MG tablet Take 1 tablet by mouth daily.  01/14/16   [provider]  atorvastatin (LIPITOR) 40 MG tablet Take 40 mg by mouth daily.    [provider]  bimatoprost (LUMIGAN) 0.01 % SOLN Place 1 drop into both eyes at bedtime.    [provider]  Blood Glucose Monitoring Suppl (ACCU-CHEK GUIDE) w/Device KIT daily.  10/18/18   [provider]  brimonidine (ALPHAGAN P) 0.1 % SOLN Place 1 drop into both eyes 3 (three) times daily.     [provider]  cephALEXin (KEFLEX) 500 MG capsule Take 1 capsule (500 mg total) by mouth 4 (four) times daily. 03/08/19   Corena Herter, PA-C  clopidogrel (PLAVIX) 75 MG tablet Take 1 tablet (75 mg total) by mouth daily. 06/04/17   [name], MD  dicyclomine (BENTYL) 10 MG capsule Take 1 capsule by mouth 3 (three) times daily. 08/09/18   [provider]  dorzolamide (TRUSOPT) 2 % ophthalmic solution Place 1 drop into both eyes daily. 08/09/18   [provider]  ergocalciferol (VITAMIN D2) 50000 units capsule Take 50,000 Units by mouth every Saturday.     [provider]  fentaNYL (DURAGESIC) 12 MCG/HR Place 1 patch onto the skin every 3 (three) days. 11/10/18   [provider]  ferrous gluconate (FERGON) 324 MG tablet Take 324 mg by mouth daily. 10/26/18   [provider]  insulin aspart protamine- aspart (NOVOLOG MIX 70/30) (70-30) 100 UNIT/ML injection Inject 0.2 mLs (20 Units total) into the skin 2 (two) times daily with a meal. Inject 20 units subcutaneously every morning and 10 units at night Patient taking differently: Inject 10-20 Units into the skin 2 (two) times daily with a meal.  Inject 20 units in the morning and 10 units at night 09/10/18   [name], MD  loperamide (IMODIUM) 2 MG capsule Take 2 mg by mouth as needed for diarrhea or loose stools.    [provider]  metoprolol tartrate (LOPRESSOR) 25 MG tablet Take 0.5 tablets (12.5 mg total) by mouth 2 (two) times daily. 09/10/18   [name], MD  nitroGLYCERIN (NITRODUR - DOSED IN MG/24 HR) 0.4 mg/hr patch Place 0.4 mg onto the skin daily.  01/21/16   [provider]  nitroGLYCERIN (NITROSTAT) 0.4 MG SL tablet Place 1 tablet (0.4 mg total) under the tongue every 5 (five) minutes  as needed for chest pain (CP or SOB). 12/23/15   [name], MD  omeprazole (PRILOSEC) 40 MG capsule Take 40 mg by mouth daily.     [provider]  potassium chloride SA (K-DUR,KLOR-CON) 20 MEQ tablet Take 0.5 tablets (10 mEq total) by mouth daily. 11/07/17   Dixie Dials, MD  RHOPRESSA 0.02 % SOLN Place 1 drop into both eyes at bedtime. 08/08/18   [provider]  torsemide (DEMADEX) 100 MG tablet Take 100 mg by mouth daily. 10/20/18   [provider]    Allergies    Aspirin, Blue dyes (parenteral), Ibuprofen, Contrast media [iodinated diagnostic agents], Iohexol, and Shellfish-derived products  Review of Systems   Review of Systems  All other systems reviewed and are negative.   Physical Exam Updated Vital Signs BP 109/90   Pulse 63   Temp 98.4 F (36.9 C) (Oral)   Resp 17   SpO2 100%   Physical Exam Vitals and nursing note reviewed. Exam conducted with a chaperone present.  Constitutional:      Appearance: Normal appearance.  HENT:     Head: Normocephalic and atraumatic.  Eyes:     General: No scleral icterus.    Conjunctiva/sclera: Conjunctivae normal.  Cardiovascular:     Rate and Rhythm: Normal rate and regular rhythm.     Pulses: Normal pulses.     Heart sounds: Normal heart sounds.  Pulmonary:     Effort: Pulmonary effort is normal. No respiratory distress.      Breath sounds: Normal breath sounds. No wheezing or rales.  Abdominal:     Comments: Soft, nondistended.  Moderate TTP over RLQ and suprapubic region.  No TTP elsewhere.  No overlying skin changes.  No masses appreciated.  No guarding.  Musculoskeletal:     Cervical back: Normal range of motion.     Comments: Mild pedal edema.  Pulses and cap refill intact bilaterally.  Skin:    General: Skin is dry.  Neurological:     Mental Status: She is alert.     GCS: GCS eye subscore is 4. GCS verbal subscore is 5. GCS motor subscore is 6.  Psychiatric:        Mood and Affect: Mood normal.        Behavior: Behavior normal.        Thought Content: Thought content normal.     ED Results / Procedures / Treatments   Labs (all labs ordered are listed, but only abnormal results are displayed) Labs Reviewed  COMPREHENSIVE METABOLIC PANEL - Abnormal; Notable for the following components:      Result Value   Potassium 5.2 (*)    CO2 20 (*)    Glucose, Bld 169 (*)    BUN 44 (*)    Creatinine, Ser 1.67 (*)    GFR calc non Af Amer 27 (*)    GFR calc Af Amer 32 (*)    All other components within normal limits  CBC  POC OCCULT BLOOD, ED  TYPE AND SCREEN  ABO/RH    EKG None  Radiology CT ABDOMEN PELVIS WO CONTRAST  Result Date: 03/08/2019 CLINICAL DATA:  Concern for bowel obstruction. EXAM: CT ABDOMEN AND PELVIS WITHOUT CONTRAST TECHNIQUE: Multidetector CT imaging of the abdomen and pelvis was performed following the standard protocol without IV contrast. COMPARISON:  None. FINDINGS: Lower chest: Lung bases are clear. Hepatobiliary: Entirety of liver not imaged. Pancreas: Pancreas is normal. No ductal dilatation. No pancreatic inflammation. Spleen: Normal spleen Adrenals/urinary tract:  Adrenal glands and kidneys are normal. The ureters and bladder normal. Subtle dependent high-density material layering bladder (image 67/3) Stomach/Bowel: Stomach, duodenum small-bowel. Pericecal inflammation. The  ascending, transverse descending colon normal. Rectosigmoid colon normal. Vascular/Lymphatic: Abdominal aorta is normal caliber with atherosclerotic calcification. There is no retroperitoneal or periportal lymphadenopathy. No pelvic lymphadenopathy. IVC filter infrarenal location Reproductive: Post hysterectomy. Other: No free fluid. Musculoskeletal: No aggressive osseous lesion. Ejection granulomas in the flanks IMPRESSION: 1. No evidence of bowel obstruction. 2. No acute findings 3. Infrarenal IVC filter. 4.  Aortic Atherosclerosis (ICD10-I70.0). 5. Potential debris layering in the bladder. Recommend correlation for cystitis Electronically Signed   By: Suzy Bouchard M.D.   On: 03/08/2019 11:07    Procedures Procedures (including critical care time)  Medications Ordered in ED Medications  sodium chloride 0.9 % bolus 500 mL (500 mLs Intravenous New Bag/Given 03/08/19 0942)  morphine 4 MG/ML injection 4 mg (4 mg Intravenous Given 03/08/19 0942)  ondansetron (ZOFRAN) injection 4 mg (4 mg Intravenous Given 03/08/19 8889)    ED Course  I have reviewed the triage vital signs and the nursing notes.  Pertinent labs & imaging results that were available during my care of the patient were reviewed by me and considered in my medical decision making (see chart for details).    MDM Rules/Calculators/A&P                      Patient recently evaluated in the ED 11/18/2018 for chest pain.  Bedside DVT ultrasound demonstrated no DVT bilaterally.  Patient pretreated given contrast allergy and CTA chest negative for PE.  Troponins were negative.  Patient followed by Dr. Terrence Dupont.  POC fecal occult blood test was negative, although appeared dark during my collection.  Patient's hemoglobin is 12.1 which is actually improved from her baseline.  CMP demonstrates a mild hyperkalemia to 5.1 as well as an AKI.  Creatinine is 1.67 and GFR is 32 when compared to labs obtained 3 months ago which were 0.98 and >60  respectively. IVF, Zofran, and Morphine provided while awaiting results of CT abdomen and pelvis.   CT abdomen and pelvis no evidence of bowel obstruction or other acute findings, but debris layering in the bladder possibly suggestive of a cystitis.  On reexamination, patient endorses that she has been having increased urinary frequency and intermittent dysuria.  Given her nausea and suprapubic discomfort in conjunction with CT evidence of cystitis, will treat with Keflex x5 days. Discussed case with Dr. Kathrynn Humble who personally evaluated patient and he agrees with assessment and plan.  Instructed to follow-up with PCP in a week or 2 regarding your worsening renal function, likely AKI attributable to dehydration in the setting of diminished p.o. intake.  All of the evaluation and work-up results were discussed with the patient and any family at bedside. They were provided opportunity to ask any additional questions and have none at this time. They have expressed understanding of verbal discharge instructions as well as return precautions and are agreeable to the plan.     Final Clinical Impression(s) / ED Diagnoses Final diagnoses:  Cystitis  AKI (acute kidney injury) ([place])    Rx / DC Orders ED Discharge Orders         Ordered    cephALEXin (KEFLEX) 500 MG capsule  4 times daily     03/08/19 1151           Reita Chard 03/08/19 1152    Varney Biles, MD  03/08/19 1522  

## 2019-03-08 NOTE — Discharge Instructions (Addendum)
Please take your Keflex antibiotics, as prescribed.  Please call Dr. Terrence Dupont to schedule an appointment for repeat lab work to ensure that your kidney function has improved.  It is vitally important that you increase your oral hydration as you are likely dehydrated.  Please consider ginger ale for nausea symptoms.  Please return to the ED or seek medical attention for any new or worsening symptoms.

## 2019-04-18 ENCOUNTER — Other Ambulatory Visit: Payer: Self-pay

## 2019-04-18 ENCOUNTER — Emergency Department (HOSPITAL_COMMUNITY)
Admission: EM | Admit: 2019-04-18 | Discharge: 2019-04-18 | Disposition: A | Discharge status: Home / Self Care | Payer: Medicare Other | Attending: Emergency Medicine | Admitting: Emergency Medicine

## 2019-04-18 ENCOUNTER — Encounter (HOSPITAL_COMMUNITY): Payer: Self-pay | Admitting: Emergency Medicine

## 2019-04-18 DIAGNOSIS — I130 Hypertensive heart and chronic kidney disease with heart failure and stage 1 through stage 4 chronic kidney disease, or unspecified chronic kidney disease: Secondary | ICD-10-CM | POA: Diagnosis not present

## 2019-04-18 DIAGNOSIS — Z794 Long term (current) use of insulin: Secondary | ICD-10-CM | POA: Diagnosis not present

## 2019-04-18 DIAGNOSIS — Z8673 Personal history of transient ischemic attack (TIA), and cerebral infarction without residual deficits: Secondary | ICD-10-CM | POA: Insufficient documentation

## 2019-04-18 DIAGNOSIS — Z79899 Other long term (current) drug therapy: Secondary | ICD-10-CM | POA: Insufficient documentation

## 2019-04-18 DIAGNOSIS — N390 Urinary tract infection, site not specified: Secondary | ICD-10-CM | POA: Diagnosis not present

## 2019-04-18 DIAGNOSIS — Z7902 Long term (current) use of antithrombotics/antiplatelets: Secondary | ICD-10-CM | POA: Insufficient documentation

## 2019-04-18 DIAGNOSIS — E1122 Type 2 diabetes mellitus with diabetic chronic kidney disease: Secondary | ICD-10-CM | POA: Diagnosis not present

## 2019-04-18 DIAGNOSIS — R350 Frequency of micturition: Secondary | ICD-10-CM | POA: Diagnosis present

## 2019-04-18 DIAGNOSIS — Z87891 Personal history of nicotine dependence: Secondary | ICD-10-CM | POA: Diagnosis not present

## 2019-04-18 DIAGNOSIS — Z9049 Acquired absence of other specified parts of digestive tract: Secondary | ICD-10-CM | POA: Insufficient documentation

## 2019-04-18 DIAGNOSIS — N189 Chronic kidney disease, unspecified: Secondary | ICD-10-CM | POA: Insufficient documentation

## 2019-04-18 DIAGNOSIS — I509 Heart failure, unspecified: Secondary | ICD-10-CM | POA: Insufficient documentation

## 2019-04-18 DIAGNOSIS — I2510 Atherosclerotic heart disease of native coronary artery without angina pectoris: Secondary | ICD-10-CM | POA: Diagnosis not present

## 2019-04-18 LAB — CBC
HCT: 38.7 % (ref 36.0–46.0)
Hemoglobin: 11.9 g/dL — ABNORMAL LOW (ref 12.0–15.0)
MCH: 28.1 pg (ref 26.0–34.0)
MCHC: 30.7 g/dL (ref 30.0–36.0)
MCV: 91.3 fL (ref 80.0–100.0)
Platelets: 247 10*3/uL (ref 150–400)
RBC: 4.24 MIL/uL (ref 3.87–5.11)
RDW: 14.4 % (ref 11.5–15.5)
WBC: 5.2 10*3/uL (ref 4.0–10.5)
nRBC: 0 % (ref 0.0–0.2)

## 2019-04-18 LAB — URINALYSIS, ROUTINE W REFLEX MICROSCOPIC
Bilirubin Urine: NEGATIVE
Glucose, UA: NEGATIVE mg/dL
Ketones, ur: NEGATIVE mg/dL
Nitrite: NEGATIVE
Protein, ur: NEGATIVE mg/dL
Specific Gravity, Urine: 1.008 (ref 1.005–1.030)
WBC, UA: >50 WBC/hpf — ABNORMAL HIGH (ref 0–5)
pH: 5 (ref 5.0–8.0)

## 2019-04-18 LAB — COMPREHENSIVE METABOLIC PANEL
ALT: 10 U/L (ref 0–44)
AST: 17 U/L (ref 15–41)
Albumin: 3.9 g/dL (ref 3.5–5.0)
Alkaline Phosphatase: 75 U/L (ref 38–126)
Anion gap: 13 (ref 5–15)
BUN: 35 mg/dL — ABNORMAL HIGH (ref 8–23)
CO2: 22 mmol/L (ref 22–32)
Calcium: 9.8 mg/dL (ref 8.9–10.3)
Chloride: 110 mmol/L (ref 98–111)
Creatinine, Ser: 1.35 mg/dL — ABNORMAL HIGH (ref 0.44–1.00)
GFR calc Af Amer: 41 mL/min — ABNORMAL LOW (ref >60)
GFR calc non Af Amer: 35 mL/min — ABNORMAL LOW (ref >60)
Glucose, Bld: 124 mg/dL — ABNORMAL HIGH (ref 70–99)
Potassium: 4.5 mmol/L (ref 3.5–5.1)
Sodium: 145 mmol/L (ref 135–145)
Total Bilirubin: 0.8 mg/dL (ref 0.3–1.2)
Total Protein: 7.4 g/dL (ref 6.5–8.1)

## 2019-04-18 LAB — LIPASE, BLOOD: Lipase: 32 U/L (ref 11–51)

## 2019-04-18 MED ORDER — SODIUM CHLORIDE 0.9% FLUSH: 3.0000 mL | Freq: Once | INTRAVENOUS | Status: DC

## 2019-04-18 MED ORDER — CEPHALEXIN 500 MG PO CAPS: 500.0000 mg | ORAL_CAPSULE | Freq: Two times a day (BID) | ORAL | 0 refills | Status: AC

## 2019-04-18 NOTE — Discharge Instructions (Signed)
Take antibiotics as prescribed.  Recheck with your primary care doctor.

## 2019-04-18 NOTE — ED Provider Notes (Signed)
[facility] EMERGENCY DEPARTMENT Provider Note   CSN: [id number] Arrival date & time: 04/18/19  1110     History Chief Complaint  Patient presents with  . Urinary Tract Infection    Katherine [name] is a 86 y.o. female.  86yo female with past medical history of insulin dependent diabetes, afib on Plavix, CKD, complaint of not feeling well since yesterday. Reports urinary frequency and dysuria onset yesterday. Denies nausea, fever, vomiting, abdominal pain, changes in bowel habits.  Patient states that she was at Dr. Zenia Resides office today and was told to come to the emergency room. No other complaints or concerns.         Past Medical History:  Diagnosis Date  . Anemia    mild  . Atrial fibrillation ([place])   . Bowel obstruction ([place]) 2015   PSBO  . Bowel obstruction ([place]) 10/2017  . CAD (coronary artery disease)   . Carotid artery stenosis   . CHF (congestive heart failure) ([place])   . Chronic kidney disease    renal insufficiency - DR Florene Glen yearly  . Diabetes mellitus without complication ([place])    type 2  . Full dentures   . History of hiatal hernia   . Hypertension   . Mobility impaired    uses walker for ambulation  . Stroke [facility])    right sided weakness,walker  . Wears glasses     Patient Active Problem List   Diagnosis Date Noted  . Small bowel obstruction ([place]) 10/31/2017  . Abdominal pain   . Nausea and vomiting   . Acute colitis 05/30/2017  . Chest pain 12/21/2015  . ACS (acute coronary syndrome) ([place]) 12/21/2015  . Bowel obstruction ([place]) 10/15/2013  . Angioedema due to seafood allergy 09/11/2013  . Nipple discharge, bloody 01/07/2012    Past Surgical History:  Procedure Laterality Date  . ABDOMINAL HYSTERECTOMY    . angiodysplasia    . APPENDECTOMY    . BREAST DUCTAL SYSTEM EXCISION  03/02/2012   Procedure: EXCISION DUCTAL SYSTEM BREAST;  Surgeon: Adin Hector, MD;  Location: [facility];  Service: General;   Laterality: Left;  . BREAST EXCISIONAL BIOPSY    . BREAST SURGERY     bx rt and lt   . CATARACT EXTRACTION     left  . CHOLECYSTECTOMY    . COLONOSCOPY N/A 10/15/2012   Procedure: COLONOSCOPY;  Surgeon: Beryle Beams, MD;  [facility];  Service: Endoscopy;  Laterality: N/A;  . DILATION AND CURETTAGE OF UTERUS    . DVT    . ENTEROSCOPY N/A 11/17/2014   Procedure: ENTEROSCOPY;  Surgeon: Carol Ada, MD;  [facility];  Service: Endoscopy;  Laterality: N/A;  . ESOPHAGOGASTRODUODENOSCOPY N/A 09/10/2012   Procedure: ESOPHAGOGASTRODUODENOSCOPY (EGD);  Surgeon: Beryle Beams, MD;  Location: [facility];  Service: Endoscopy;  Laterality: N/A;  . EYE SURGERY     right-glaucoma  . HOT HEMOSTASIS N/A 09/10/2012   Procedure: HOT HEMOSTASIS (ARGON PLASMA COAGULATION/BICAP);  Surgeon: Beryle Beams, MD;  Location: [facility];  Service: Endoscopy;  Laterality: N/A;  . HOT HEMOSTASIS N/A 11/17/2014   Procedure: HOT HEMOSTASIS (ARGON PLASMA COAGULATION/BICAP);  Surgeon: Carol Ada, MD;  Location: [facility];  Service: Endoscopy;  Laterality: N/A;  . TUBAL LIGATION       OB History   No obstetric history on file.     Family History  Problem Relation Age of Onset  . Heart disease Sister   . Heart  disease Brother   . Heart disease Mother   . Heart disease Father   . Heart disease Daughter     Social History   Tobacco Use  . Smoking status: Former Smoker    Quit date: 02/25/2002    Years since quitting: 17.1  . Smokeless tobacco: Never Used  Substance Use Topics  . Alcohol use: No  . Drug use: No    Home Medications Prior to Admission medications   Medication Sig Start Date End Date Taking? Authorizing Provider  Accu-Chek FastClix Lancets MISC daily. 10/18/18   [provider]  ACCU-CHEK GUIDE test strip 1 strip daily. 10/18/18   [provider]  amLODipine-valsartan (EXFORGE) 5-320 MG tablet Take 1 tablet by mouth daily.  01/14/16    [provider]  atorvastatin (LIPITOR) 40 MG tablet Take 40 mg by mouth daily.    [provider]  bimatoprost (LUMIGAN) 0.01 % SOLN Place 1 drop into both eyes at bedtime.    [provider]  Blood Glucose Monitoring Suppl (ACCU-CHEK GUIDE) w/Device KIT daily.  10/18/18   [provider]  brimonidine (ALPHAGAN P) 0.1 % SOLN Place 1 drop into both eyes 3 (three) times daily.     [provider]  cephALEXin (KEFLEX) 500 MG capsule Take 1 capsule (500 mg total) by mouth 2 (two) times daily for 5 days. 04/18/19 04/23/19  Tacy Learn, PA-C  clopidogrel (PLAVIX) 75 MG tablet Take 1 tablet (75 mg total) by mouth daily. 06/04/17   [name], MD  dicyclomine (BENTYL) 10 MG capsule Take 1 capsule by mouth 3 (three) times daily. 08/09/18   [provider]  dorzolamide (TRUSOPT) 2 % ophthalmic solution Place 1 drop into both eyes daily. 08/09/18   [provider]  ergocalciferol (VITAMIN D2) 50000 units capsule Take 50,000 Units by mouth every Saturday.     [provider]  fentaNYL (DURAGESIC) 12 MCG/HR Place 1 patch onto the skin every 3 (three) days. 11/10/18   [provider]  ferrous gluconate (FERGON) 324 MG tablet Take 324 mg by mouth daily. 10/26/18   [provider]  insulin aspart protamine- aspart (NOVOLOG MIX 70/30) (70-30) 100 UNIT/ML injection Inject 0.2 mLs (20 Units total) into the skin 2 (two) times daily with a meal. Inject 20 units subcutaneously every morning and 10 units at night Patient taking differently: Inject 10-20 Units into the skin 2 (two) times daily with a meal. Inject 20 units in the morning and 10 units at night 09/10/18   [name], MD  loperamide (IMODIUM) 2 MG capsule Take 2 mg by mouth as needed for diarrhea or loose stools.    [provider]  metoprolol tartrate (LOPRESSOR) 25 MG tablet Take 0.5 tablets (12.5 mg total) by mouth 2 (two) times daily. 09/10/18   [name],  MD  nitroGLYCERIN (NITRODUR - DOSED IN MG/24 HR) 0.4 mg/hr patch Place 0.4 mg onto the skin daily.  01/21/16   [provider]  nitroGLYCERIN (NITROSTAT) 0.4 MG SL tablet Place 1 tablet (0.4 mg total) under the tongue every 5 (five) minutes as needed for chest pain (CP or SOB). 12/23/15   [name], MD  omeprazole (PRILOSEC) 40 MG capsule Take 40 mg by mouth daily.     [provider]  potassium chloride SA (K-DUR,KLOR-CON) 20 MEQ tablet Take 0.5 tablets (10 mEq total) by mouth daily. 11/07/17   Dixie Dials, MD  RHOPRESSA 0.02 % SOLN Place 1 drop into both eyes at  bedtime. 08/08/18   [provider]  torsemide (DEMADEX) 100 MG tablet Take 100 mg by mouth daily. 10/20/18   [provider]    Allergies    Aspirin, Blue dyes (parenteral), Ibuprofen, Contrast media [iodinated diagnostic agents], Iohexol, and Shellfish-derived products  Review of Systems   Review of Systems  Constitutional: Negative for fever.  Respiratory: Negative for shortness of breath.   Cardiovascular: Negative for chest pain.  Gastrointestinal: Negative for abdominal pain, constipation, diarrhea, nausea and vomiting.  Genitourinary: Positive for dysuria and frequency.  Skin: Negative for rash and wound.  Allergic/Immunologic: Positive for immunocompromised state.  Neurological: Negative for weakness.  Psychiatric/Behavioral: Negative for confusion.  All other systems reviewed and are negative.   Physical Exam Updated Vital Signs BP 109/63 (BP Location: Right Arm)   Pulse (!) 57   Temp 98.4 F (36.9 C) (Oral)   Resp 14   SpO2 100%   Physical Exam Vitals and nursing note reviewed.  Constitutional:      General: She is not in acute distress.    Appearance: She is well-developed. She is not diaphoretic.  HENT:     Head: Normocephalic and atraumatic.  Cardiovascular:     Rate and Rhythm: Normal rate and regular rhythm.     Pulses: Normal pulses.     Heart sounds: Normal  heart sounds.  Pulmonary:     Effort: Pulmonary effort is normal.     Breath sounds: Normal breath sounds.  Abdominal:     Palpations: Abdomen is soft.     Tenderness: There is abdominal tenderness in the suprapubic area.     Comments: Multiple well healed surgical scars  Musculoskeletal:        General: No tenderness.     Right lower leg: No edema.     Left lower leg: No edema.  Skin:    General: Skin is warm and dry.     Findings: No erythema or rash.  Neurological:     Mental Status: She is alert and oriented to person, place, and time.  Psychiatric:        Behavior: Behavior normal.     ED Results / Procedures / Treatments   Labs (all labs ordered are listed, but only abnormal results are displayed) Labs Reviewed  COMPREHENSIVE METABOLIC PANEL - Abnormal; Notable for the following components:      Result Value   Glucose, Bld 124 (*)    BUN 35 (*)    Creatinine, Ser 1.35 (*)    GFR calc non Af Amer 35 (*)    GFR calc Af Amer 41 (*)    All other components within normal limits  CBC - Abnormal; Notable for the following components:   Hemoglobin 11.9 (*)    All other components within normal limits  URINALYSIS, ROUTINE W REFLEX MICROSCOPIC - Abnormal; Notable for the following components:   APPearance CLOUDY (*)    Hgb urine dipstick SMALL (*)    Leukocytes,Ua LARGE (*)    WBC, UA >50 (*)    Bacteria, UA MANY (*)    All other components within normal limits  URINE CULTURE  LIPASE, BLOOD    EKG None  Radiology No results found.  Procedures Procedures (including critical care time)  Medications Ordered in ED Medications  sodium chloride flush (NS) 0.9 % injection 3 mL (has no administration in time range)    ED Course  I have reviewed the triage vital signs and the nursing notes.  Pertinent labs &  imaging results that were available during my care of the patient were reviewed by me and considered in my medical decision making (see chart for details).   Clinical Course as of Apr 17 1345  Mon Apr 17, 2418  1190 86 year old female with complaint of dysuria and frequency onset yesterday, similar to prior ER visit on January 5 where she was diagnosed with a urinary tract infection and treated with Keflex.  Patient states that she went to Dr. Zenia Resides office today and he told her to come to the emergency room.  Call to Dr. Terrence Dupont for clarification, states that patient had reported possibly having vaginal discharge which is why he told her to follow-up with her PCP or gynecologist otherwise did not feel that she needed to be in the emergency room today.  On exam, patient has vague lower abdominal tenderness with multiple well-healed surgical scars, she is well-appearing.  CBC is unremarkable, CMP with mildly elevated creatinine at 1.35, improved from prior in January.  Urinalysis suggestive of urinary tract infection with large leukocytes, greater than 50 white blood cells and many bacteria.  Labs are otherwise reassuring, will plan to treat patient's urinary tract infection, will send culture and refer to PCP for recheck.   [LM]    Clinical Course User Index [LM] Roque Lias   MDM Rules/Calculators/A&P                      Final Clinical Impression(s) / ED Diagnoses Final diagnoses:  Lower urinary tract infectious disease    Rx / DC Orders ED Discharge Orders         Ordered    cephALEXin (KEFLEX) 500 MG capsule  2 times daily     04/18/19 1345           Roque Lias 04/18/19 1347    Malvin Johns, MD 04/19/19 [phone]

## 2019-04-18 NOTE — ED Triage Notes (Signed)
Pt reports lower abd pain with urination, and foul smell with cloudy urine. Pt reports this has been ongoing for over 2 weeks.

## 2019-05-08 ENCOUNTER — Encounter (HOSPITAL_COMMUNITY): Payer: Self-pay

## 2019-05-08 ENCOUNTER — Emergency Department (HOSPITAL_COMMUNITY)
Admission: EM | Admit: 2019-05-08 | Discharge: 2019-05-09 | Disposition: A | Discharge status: Home / Self Care | Payer: Medicare Other | Attending: Emergency Medicine | Admitting: Emergency Medicine

## 2019-05-08 DIAGNOSIS — I509 Heart failure, unspecified: Secondary | ICD-10-CM | POA: Insufficient documentation

## 2019-05-08 DIAGNOSIS — I110 Hypertensive heart disease with heart failure: Secondary | ICD-10-CM | POA: Diagnosis not present

## 2019-05-08 DIAGNOSIS — Z794 Long term (current) use of insulin: Secondary | ICD-10-CM | POA: Insufficient documentation

## 2019-05-08 DIAGNOSIS — E11649 Type 2 diabetes mellitus with hypoglycemia without coma: Secondary | ICD-10-CM | POA: Diagnosis not present

## 2019-05-08 DIAGNOSIS — I2510 Atherosclerotic heart disease of native coronary artery without angina pectoris: Secondary | ICD-10-CM | POA: Insufficient documentation

## 2019-05-08 DIAGNOSIS — Z79899 Other long term (current) drug therapy: Secondary | ICD-10-CM | POA: Insufficient documentation

## 2019-05-08 DIAGNOSIS — E162 Hypoglycemia, unspecified: Secondary | ICD-10-CM

## 2019-05-08 LAB — CBG MONITORING, ED
Glucose-Capillary: 152 mg/dL — ABNORMAL HIGH (ref 70–99)
Glucose-Capillary: 170 mg/dL — ABNORMAL HIGH (ref 70–99)
Glucose-Capillary: 158 mg/dL — ABNORMAL HIGH (ref 70–99)
Glucose-Capillary: 184 mg/dL — ABNORMAL HIGH (ref 70–99)

## 2019-05-08 LAB — COMPREHENSIVE METABOLIC PANEL
ALT: 13 U/L (ref 0–44)
AST: 19 U/L (ref 15–41)
Albumin: 3.5 g/dL (ref 3.5–5.0)
Alkaline Phosphatase: 67 U/L (ref 38–126)
Anion gap: 10 (ref 5–15)
BUN: 36 mg/dL — ABNORMAL HIGH (ref 8–23)
CO2: 20 mmol/L — ABNORMAL LOW (ref 22–32)
Calcium: 9.1 mg/dL (ref 8.9–10.3)
Chloride: 107 mmol/L (ref 98–111)
Creatinine, Ser: 1.54 mg/dL — ABNORMAL HIGH (ref 0.44–1.00)
GFR calc Af Amer: 35 mL/min — ABNORMAL LOW (ref >60)
GFR calc non Af Amer: 30 mL/min — ABNORMAL LOW (ref >60)
Glucose, Bld: 300 mg/dL — ABNORMAL HIGH (ref 70–99)
Potassium: 4.4 mmol/L (ref 3.5–5.1)
Sodium: 137 mmol/L (ref 135–145)
Total Bilirubin: 0.5 mg/dL (ref 0.3–1.2)
Total Protein: 6.7 g/dL (ref 6.5–8.1)

## 2019-05-08 LAB — CBC WITH DIFFERENTIAL/PLATELET
Abs Immature Granulocytes: 0.02 K/uL (ref 0.00–0.07)
Basophils Absolute: 0 K/uL (ref 0.0–0.1)
Basophils Relative: 1 %
Eosinophils Absolute: 0 K/uL (ref 0.0–0.5)
Eosinophils Relative: 0 %
HCT: 37.6 % (ref 36.0–46.0)
Hemoglobin: 11.7 g/dL — ABNORMAL LOW (ref 12.0–15.0)
Immature Granulocytes: 0 %
Lymphocytes Relative: 20 %
Lymphs Abs: 1.2 K/uL (ref 0.7–4.0)
MCH: 28.5 pg (ref 26.0–34.0)
MCHC: 31.1 g/dL (ref 30.0–36.0)
MCV: 91.5 fL (ref 80.0–100.0)
Monocytes Absolute: 0.3 K/uL (ref 0.1–1.0)
Monocytes Relative: 4 %
Neutro Abs: 4.4 K/uL (ref 1.7–7.7)
Neutrophils Relative %: 75 %
Platelets: 220 K/uL (ref 150–400)
RBC: 4.11 MIL/uL (ref 3.87–5.11)
RDW: 13.9 % (ref 11.5–15.5)
WBC: 5.9 K/uL (ref 4.0–10.5)
nRBC: 0 % (ref 0.0–0.2)

## 2019-05-08 NOTE — Discharge Instructions (Signed)
Hold insulin tonight   Eat regularly and check your blood sugar regularly   See your doctor  Return to ER if your glucose is less than 60 or greater than 600, vomiting, abdominal pain, fever

## 2019-05-08 NOTE — ED Provider Notes (Signed)
[facility] EMERGENCY DEPARTMENT Provider Note   CSN: [id number] Arrival date & time: 05/08/19  2004     History Chief Complaint  Patient presents with  . Hypoglycemia    Katherine [name] is a 86 y.o. female hx of CAD, CHF, diabetes on insulin here presenting with hypoglycemia.  Patient has been confused at home and was noted to have a sugar of 52.  Patient was given sandwich or orange juice and then went back down to 42.  Patient was given D10 by EMS.  Patient states that she did not eat much today.  She last took her insulin last night but did not take any today.  The history is provided by the patient.       Past Medical History:  Diagnosis Date  . Anemia    mild  . Atrial fibrillation ([place])   . Bowel obstruction ([place]) 2015   PSBO  . Bowel obstruction ([place]) 10/2017  . CAD (coronary artery disease)   . Carotid artery stenosis   . CHF (congestive heart failure) ([place])   . Chronic kidney disease    renal insufficiency - DR Florene Glen yearly  . Diabetes mellitus without complication ([place])    type 2  . Full dentures   . History of hiatal hernia   . Hypertension   . Mobility impaired    uses walker for ambulation  . Stroke [facility])    right sided weakness,walker  . Wears glasses     Patient Active Problem List   Diagnosis Date Noted  . Small bowel obstruction ([place]) 10/31/2017  . Abdominal pain   . Nausea and vomiting   . Acute colitis 05/30/2017  . Chest pain 12/21/2015  . ACS (acute coronary syndrome) ([place]) 12/21/2015  . Bowel obstruction ([place]) 10/15/2013  . Angioedema due to seafood allergy 09/11/2013  . Nipple discharge, bloody 01/07/2012    Past Surgical History:  Procedure Laterality Date  . ABDOMINAL HYSTERECTOMY    . angiodysplasia    . APPENDECTOMY    . BREAST DUCTAL SYSTEM EXCISION  03/02/2012   Procedure: EXCISION DUCTAL SYSTEM BREAST;  Surgeon: Adin Hector, MD;  Location: [facility];  Service: General;  Laterality:  Left;  . BREAST EXCISIONAL BIOPSY    . BREAST SURGERY     bx rt and lt   . CATARACT EXTRACTION     left  . CHOLECYSTECTOMY    . COLONOSCOPY N/A 10/15/2012   Procedure: COLONOSCOPY;  Surgeon: Beryle Beams, MD;  [facility];  Service: Endoscopy;  Laterality: N/A;  . DILATION AND CURETTAGE OF UTERUS    . DVT    . ENTEROSCOPY N/A 11/17/2014   Procedure: ENTEROSCOPY;  Surgeon: Carol Ada, MD;  [facility];  Service: Endoscopy;  Laterality: N/A;  . ESOPHAGOGASTRODUODENOSCOPY N/A 09/10/2012   Procedure: ESOPHAGOGASTRODUODENOSCOPY (EGD);  Surgeon: Beryle Beams, MD;  Location: [facility];  Service: Endoscopy;  Laterality: N/A;  . EYE SURGERY     right-glaucoma  . HOT HEMOSTASIS N/A 09/10/2012   Procedure: HOT HEMOSTASIS (ARGON PLASMA COAGULATION/BICAP);  Surgeon: Beryle Beams, MD;  Location: [facility];  Service: Endoscopy;  Laterality: N/A;  . HOT HEMOSTASIS N/A 11/17/2014   Procedure: HOT HEMOSTASIS (ARGON PLASMA COAGULATION/BICAP);  Surgeon: Carol Ada, MD;  Location: [facility];  Service: Endoscopy;  Laterality: N/A;  . TUBAL LIGATION       OB History   No obstetric history on file.     Family History  Problem  Relation Age of Onset  . Heart disease Sister   . Heart disease Brother   . Heart disease Mother   . Heart disease Father   . Heart disease Daughter     Social History   Tobacco Use  . Smoking status: Former Smoker    Quit date: 02/25/2002    Years since quitting: 17.2  . Smokeless tobacco: Never Used  Substance Use Topics  . Alcohol use: No  . Drug use: No    Home Medications Prior to Admission medications   Medication Sig Start Date End Date Taking? Authorizing Provider  Accu-Chek FastClix Lancets MISC daily. 10/18/18   [provider]  ACCU-CHEK GUIDE test strip 1 strip daily. 10/18/18   [provider]  amLODipine-valsartan (EXFORGE) 5-320 MG tablet Take 1 tablet by mouth daily.  01/14/16   [provider]  atorvastatin (LIPITOR) 40 MG tablet Take 40 mg by mouth daily.    [provider]  bimatoprost (LUMIGAN) 0.01 % SOLN Place 1 drop into both eyes at bedtime.    [provider]  Blood Glucose Monitoring Suppl (ACCU-CHEK GUIDE) w/Device KIT daily.  10/18/18   [provider]  brimonidine (ALPHAGAN P) 0.1 % SOLN Place 1 drop into both eyes 3 (three) times daily.     [provider]  clopidogrel (PLAVIX) 75 MG tablet Take 1 tablet (75 mg total) by mouth daily. 06/04/17   [name], MD  dicyclomine (BENTYL) 10 MG capsule Take 1 capsule by mouth 3 (three) times daily. 08/09/18   [provider]  dorzolamide (TRUSOPT) 2 % ophthalmic solution Place 1 drop into both eyes daily. 08/09/18   [provider]  ergocalciferol (VITAMIN D2) 50000 units capsule Take 50,000 Units by mouth every Saturday.     [provider]  fentaNYL (DURAGESIC) 12 MCG/HR Place 1 patch onto the skin every 3 (three) days. 11/10/18   [provider]  ferrous gluconate (FERGON) 324 MG tablet Take 324 mg by mouth daily. 10/26/18   [provider]  insulin aspart protamine- aspart (NOVOLOG MIX 70/30) (70-30) 100 UNIT/ML injection Inject 0.2 mLs (20 Units total) into the skin 2 (two) times daily with a meal. Inject 20 units subcutaneously every morning and 10 units at night Patient taking differently: Inject 10-20 Units into the skin 2 (two) times daily with a meal. Inject 20 units in the morning and 10 units at night 09/10/18   [name], MD  loperamide (IMODIUM) 2 MG capsule Take 2 mg by mouth as needed for diarrhea or loose stools.    [provider]  metoprolol tartrate (LOPRESSOR) 25 MG tablet Take 0.5 tablets (12.5 mg total) by mouth 2 (two) times daily. 09/10/18   [name], MD  nitroGLYCERIN (NITRODUR - DOSED IN MG/24 HR) 0.4 mg/hr patch Place 0.4 mg onto the skin daily.  01/21/16   [provider]  nitroGLYCERIN  (NITROSTAT) 0.4 MG SL tablet Place 1 tablet (0.4 mg total) under the tongue every 5 (five) minutes as needed for chest pain (CP or SOB). 12/23/15   [name], MD  omeprazole (PRILOSEC) 40 MG capsule Take 40 mg by mouth daily.     [provider]  potassium chloride SA (K-DUR,KLOR-CON) 20 MEQ tablet Take 0.5 tablets (10 mEq total) by mouth daily. 11/07/17   Dixie Dials, MD  RHOPRESSA 0.02 % SOLN Place 1 drop into both eyes at bedtime. 08/08/18   [provider]  torsemide (DEMADEX) 100 MG tablet Take 100  mg by mouth daily. 10/20/18   [provider]  torsemide (DEMADEX) 20 MG tablet Take 20 mg by mouth daily. 05/02/19   [provider]    Allergies    Aspirin, Blue dyes (parenteral), Ibuprofen, Contrast media [iodinated diagnostic agents], Iohexol, and Shellfish-derived products  Review of Systems   Review of Systems  Psychiatric/Behavioral: Positive for confusion.  All other systems reviewed and are negative.   Physical Exam Updated Vital Signs BP (!) 141/86   Pulse 74   Temp (!) 97.3 F (36.3 C) (Oral)   Resp 20   Ht '5\' 5"'  (1.651 m)   Wt 81.6 kg   SpO2 99%   BMI 29.95 kg/m   Physical Exam Vitals and nursing note reviewed.  HENT:     Head: Normocephalic.     Right Ear: Tympanic membrane normal.     Left Ear: Tympanic membrane normal.     Nose: Nose normal.     Mouth/Throat:     Mouth: Mucous membranes are moist.  Eyes:     Extraocular Movements: Extraocular movements intact.     Pupils: Pupils are equal, round, and reactive to light.  Cardiovascular:     Rate and Rhythm: Normal rate and regular rhythm.     Pulses: Normal pulses.     Heart sounds: Normal heart sounds.  Pulmonary:     Effort: Pulmonary effort is normal.     Breath sounds: Normal breath sounds.  Abdominal:     General: Abdomen is flat.     Palpations: Abdomen is soft.  Musculoskeletal:        General: Normal range of motion.     Cervical back: Normal range of  motion.  Skin:    General: Skin is warm.     Capillary Refill: Capillary refill takes less than 2 seconds.  Neurological:     General: No focal deficit present.     Mental Status: She is alert and oriented to person, place, and time.     Cranial Nerves: No cranial nerve deficit.     Sensory: No sensory deficit.  Psychiatric:        Mood and Affect: Mood normal.        Behavior: Behavior normal.     ED Results / Procedures / Treatments   Labs (all labs ordered are listed, but only abnormal results are displayed) Labs Reviewed  CBC WITH DIFFERENTIAL/PLATELET - Abnormal; Notable for the following components:      Result Value   Hemoglobin 11.7 (*)    All other components within normal limits  COMPREHENSIVE METABOLIC PANEL - Abnormal; Notable for the following components:   CO2 20 (*)    Glucose, Bld 300 (*)    BUN 36 (*)    Creatinine, Ser 1.54 (*)    GFR calc non Af Amer 30 (*)    GFR calc Af Amer 35 (*)    All other components within normal limits  CBG MONITORING, ED - Abnormal; Notable for the following components:   Glucose-Capillary 152 (*)    All other components within normal limits  CBG MONITORING, ED - Abnormal; Notable for the following components:   Glucose-Capillary 170 (*)    All other components within normal limits  CBG MONITORING, ED - Abnormal; Notable for the following components:   Glucose-Capillary 158 (*)    All other components within normal limits  CBG MONITORING, ED - Abnormal; Notable for the following components:   Glucose-Capillary 184 (*)    All  other components within normal limits    EKG None  Radiology No results found.  Procedures Procedures (including critical care time)  Medications Ordered in ED Medications - No data to display  ED Course  I have reviewed the triage vital signs and the nursing notes.  Pertinent labs & imaging results that were available during my care of the patient were reviewed by me and considered in my  medical decision making (see chart for details).    MDM Rules/Calculators/A&P                      Katherine [name] is a 86 y.o. female here with hypoglycemia.  Patient does have history of diabetes and used insulin last night.  She did not eat much today.  Will check labs and p.o. trial and get serial CBGs.  11:07 PM Observed for about 3 hrs in the ED. CBG on discharge is 180. Tolerated PO in the ED. Stable for discharge   Final Clinical Impression(s) / ED Diagnoses Final diagnoses:  None    Rx / DC Orders ED Discharge Orders    None       Drenda Freeze, MD 05/08/19 2307

## 2019-05-08 NOTE — ED Triage Notes (Signed)
Pt came in [facility] from home with the c/o of hypoglycemia. EMS arrived to her house and initial CBG was 52, she was A/O and able to eat a PB&J Sandwich and Orange Juice. CBG increased to 72, went to the bathroom and became dizzy, CBG after urination was 42. EMS gave 25gD10 in route. Pt states "she has not had her insulin today".  22G LHand.

## 2019-05-08 NOTE — ED Notes (Signed)
Pt able to tolerate fluids well without N/V

## 2019-05-09 NOTE — ED Notes (Signed)
Patient verbalizes understanding of discharge instructions. Opportunity for questioning and answers were provided. Armband removed by staff, pt discharged from ED. Pt. ambulatory and discharged home.  

## 2019-05-18 LAB — URINE CULTURE: Culture: >=100000 — AB

## 2019-09-16 ENCOUNTER — Other Ambulatory Visit: Payer: Self-pay | Admitting: Gastroenterology

## 2019-09-16 DIAGNOSIS — K5900 Constipation, unspecified: Secondary | ICD-10-CM

## 2019-09-16 DIAGNOSIS — K921 Melena: Secondary | ICD-10-CM

## 2019-09-16 DIAGNOSIS — K31819 Angiodysplasia of stomach and duodenum without bleeding: Secondary | ICD-10-CM

## 2019-09-23 ENCOUNTER — Ambulatory Visit
Admission: RE | Admit: 2019-09-23 | Discharge: 2019-09-23 | Disposition: A | Discharge status: Home / Self Care | Payer: Medicare Other | Source: Ambulatory Visit | Attending: Gastroenterology | Admitting: Gastroenterology

## 2019-09-23 DIAGNOSIS — K31819 Angiodysplasia of stomach and duodenum without bleeding: Secondary | ICD-10-CM

## 2019-09-23 DIAGNOSIS — K921 Melena: Secondary | ICD-10-CM

## 2020-01-18 ENCOUNTER — Other Ambulatory Visit: Payer: Self-pay | Admitting: Urology

## 2020-01-19 NOTE — Patient Instructions (Addendum)
DUE TO COVID-19 ONLY ONE VISITOR IS ALLOWED TO COME WITH YOU AND STAY IN THE WAITING ROOM ONLY DURING PRE OP AND PROCEDURE DAY OF SURGERY. THE 1 VISITOR  MAY VISIT WITH YOU AFTER SURGERY IN YOUR PRIVATE ROOM DURING VISITING HOURS ONLY!  YOU NEED TO HAVE A COVID 19 TEST ON: 01/20/20 @ 12:50 PM, THIS TEST MUST BE DONE BEFORE SURGERY,  COVID TESTING SITE [place] [address], IT IS ON THE RIGHT GOING OUT [address] APPROXIMATELY  2 MINUTES PAST [employer] ON THE RIGHT. ONCE YOUR COVID TEST IS COMPLETED,  PLEASE BEGIN THE QUARANTINE INSTRUCTIONS AS OUTLINED IN YOUR HANDOUT.                Katherine [name]    Your procedure is scheduled on: 01/24/20   Report to [facility] Main  Entrance   Report to short stay at: 5:30 AM     Call this number if you have problems the morning of surgery [phone]    Remember: Do not eat food or drink liquids :After Midnight.   BRUSH YOUR TEETH MORNING OF SURGERY AND RINSE YOUR MOUTH OUT, NO CHEWING GUM CANDY OR MINTS.     Take these medicines the morning of surgery with A SIP OF WATER: amlodipine,bentyl.Use eye drops as usual.  How to Manage Your Diabetes Before and After Surgery  Why is it important to control my blood sugar before and after surgery? . Improving blood sugar levels before and after surgery helps healing and can limit problems. . A way of improving blood sugar control is eating a healthy diet by: o  Eating less sugar and carbohydrates o  Increasing activity/exercise o  Talking with your doctor about reaching your blood sugar goals . High blood sugars (greater than 180 mg/dL) can raise your risk of infections and slow your recovery, so you will need to focus on controlling your diabetes during the weeks before surgery. . Make sure that the doctor who takes care of your diabetes knows about your planned surgery including the date and location.  How do I manage my blood sugar before  surgery? . Check your blood sugar at least 4 times a day, starting 2 days before surgery, to make sure that the level is not too high or low. o Check your blood sugar the morning of your surgery when you wake up and every 2 hours until you get to the Short Stay unit. . If your blood sugar is less than 70 mg/dL, you will need to treat for low blood sugar: o Do not take insulin. o Treat a low blood sugar (less than 70 mg/dL) with  cup of clear juice (cranberry or apple), 4 glucose tablets, OR glucose gel. o Recheck blood sugar in 15 minutes after treatment (to make sure it is greater than 70 mg/dL). If your blood sugar is not greater than 70 mg/dL on recheck, call [phone] for further instructions. . Report your blood sugar to the short stay nurse when you get to Short Stay.  . If you are admitted to the hospital after surgery: o Your blood sugar will be checked by the staff and you will probably be given insulin after surgery (instead of oral diabetes medicines) to make sure you have good blood sugar levels. o The goal for blood sugar control after surgery is 80-180 mg/dL.   WHAT DO I DO ABOUT MY DIABETES MEDICATION?  Marland Kitchen Do not take oral diabetes medicines (pills)  the morning of surgery.  . THE DAY BEFORE SURGERY, take     units of       insulin.       . THE MORNING OF SURGERY, DO NOT take insulin.   DO NOT TAKE ANY DIABETIC MEDICATIONS DAY OF YOUR SURGERY                               You may not have any metal on your body including hair pins and              piercings  Do not wear jewelry, make-up, lotions, powders or perfumes, deodorant             Do not wear nail polish on your fingernails.  Do not shave  48 hours prior to surgery.           Do not bring valuables to the hospital. [place].  Contacts, dentures or bridgework may not be worn into surgery.  Leave suitcase in the car. After surgery it may be brought to your  room.     Patients discharged the day of surgery will not be allowed to drive home. IF YOU ARE HAVING SURGERY AND GOING HOME THE SAME DAY, YOU MUST HAVE AN ADULT TO DRIVE YOU HOME AND BE WITH YOU FOR 24 HOURS. YOU MAY GO HOME BY TAXI OR UBER OR ORTHERWISE, BUT AN ADULT MUST ACCOMPANY YOU HOME AND STAY WITH YOU FOR 24 HOURS.  Name and phone number of your driver:  Special Instructions: N/A              Please read over the following fact sheets you were given: _____________________________________________________________________         [facility] - Preparing for Surgery Before surgery, you can play an important role.  Because skin is not sterile, your skin needs to be as free of germs as possible.  You can reduce the number of germs on your skin by washing with CHG (chlorahexidine gluconate) soap before surgery.  CHG is an antiseptic cleaner which kills germs and bonds with the skin to continue killing germs even after washing. Please DO NOT use if you have an allergy to CHG or antibacterial soaps.  If your skin becomes reddened/irritated stop using the CHG and inform your nurse when you arrive at Short Stay. Do not shave (including legs and underarms) for at least 48 hours prior to the first CHG shower.  You may shave your face/neck. Please follow these instructions carefully:  1.  Shower with CHG Soap the night before surgery and the  morning of Surgery.  2.  If you choose to wash your hair, wash your hair first as usual with your  normal  shampoo.  3.  After you shampoo, rinse your hair and body thoroughly to remove the  shampoo.                           4.  Use CHG as you would any other liquid soap.  You can apply chg directly  to the skin and wash                       Gently with a scrungie or clean washcloth.  5.  Apply the  CHG Soap to your body ONLY FROM THE NECK DOWN.   Do not use on face/ open                           Wound or open sores. Avoid contact with eyes, ears mouth and  genitals (private parts).                       Wash face,  Genitals (private parts) with your normal soap.             6.  Wash thoroughly, paying special attention to the area where your surgery  will be performed.  7.  Thoroughly rinse your body with warm water from the neck down.  8.  DO NOT shower/wash with your normal soap after using and rinsing off  the CHG Soap.                9.  Pat yourself dry with a clean towel.            10.  Wear clean pajamas.            11.  Place clean sheets on your bed the night of your first shower and do not  sleep with pets. Day of Surgery : Do not apply any lotions/deodorants the morning of surgery.  Please wear clean clothes to the hospital/surgery center.  FAILURE TO FOLLOW THESE INSTRUCTIONS MAY RESULT IN THE CANCELLATION OF YOUR SURGERY PATIENT SIGNATURE_________________________________  NURSE SIGNATURE__________________________________  ________________________________________________________________________

## 2020-01-20 ENCOUNTER — Other Ambulatory Visit (HOSPITAL_COMMUNITY)
Admission: RE | Admit: 2020-01-20 | Discharge: 2020-01-20 | Disposition: A | Discharge status: Home / Self Care | Payer: Medicare Other | Source: Ambulatory Visit | Attending: Urology | Admitting: Urology

## 2020-01-20 ENCOUNTER — Other Ambulatory Visit: Payer: Self-pay

## 2020-01-20 ENCOUNTER — Encounter (HOSPITAL_COMMUNITY)
Admission: RE | Admit: 2020-01-20 | Discharge: 2020-01-20 | Disposition: A | Discharge status: Home / Self Care | Payer: Medicare Other | Source: Ambulatory Visit | Attending: Urology | Admitting: Urology

## 2020-01-20 ENCOUNTER — Encounter (HOSPITAL_COMMUNITY): Payer: Self-pay

## 2020-01-20 DIAGNOSIS — E119 Type 2 diabetes mellitus without complications: Secondary | ICD-10-CM | POA: Diagnosis not present

## 2020-01-20 DIAGNOSIS — Z20822 Contact with and (suspected) exposure to covid-19: Secondary | ICD-10-CM | POA: Insufficient documentation

## 2020-01-20 DIAGNOSIS — Z01812 Encounter for preprocedural laboratory examination: Secondary | ICD-10-CM | POA: Insufficient documentation

## 2020-01-20 HISTORY — DX: Angina pectoris, unspecified: I20.9

## 2020-01-20 HISTORY — DX: Nonrheumatic aortic (valve) stenosis: I35.0

## 2020-01-20 LAB — CBC
HCT: 40.4 % (ref 36.0–46.0)
Hemoglobin: 12.6 g/dL (ref 12.0–15.0)
MCH: 28.3 pg (ref 26.0–34.0)
MCHC: 31.2 g/dL (ref 30.0–36.0)
MCV: 90.8 fL (ref 80.0–100.0)
Platelets: 236 10*3/uL (ref 150–400)
RBC: 4.45 MIL/uL (ref 3.87–5.11)
RDW: 12.9 % (ref 11.5–15.5)
WBC: 5.8 10*3/uL (ref 4.0–10.5)
nRBC: 0 % (ref 0.0–0.2)

## 2020-01-20 LAB — HEMOGLOBIN A1C
Hgb A1c MFr Bld: 7.8 % — ABNORMAL HIGH (ref 4.8–5.6)
Mean Plasma Glucose: 177.16 mg/dL

## 2020-01-20 LAB — BASIC METABOLIC PANEL
Anion gap: 9 (ref 5–15)
BUN: 41 mg/dL — ABNORMAL HIGH (ref 8–23)
CO2: 24 mmol/L (ref 22–32)
Calcium: 9.5 mg/dL (ref 8.9–10.3)
Chloride: 106 mmol/L (ref 98–111)
Creatinine, Ser: 1.33 mg/dL — ABNORMAL HIGH (ref 0.44–1.00)
GFR, Estimated: 39 mL/min — ABNORMAL LOW (ref >60)
Glucose, Bld: 191 mg/dL — ABNORMAL HIGH (ref 70–99)
Potassium: 5.1 mmol/L (ref 3.5–5.1)
Sodium: 139 mmol/L (ref 135–145)

## 2020-01-20 LAB — SARS CORONAVIRUS 2 (TAT 6-24 HRS): SARS Coronavirus 2: NEGATIVE

## 2020-01-20 LAB — GLUCOSE, CAPILLARY: Glucose-Capillary: 181 mg/dL — ABNORMAL HIGH (ref 70–99)

## 2020-01-20 NOTE — Progress Notes (Signed)
COVID Vaccine Completed: Yes Date COVID Vaccine completed: 08/11/19 COVID vaccine manufacturer: Pfizer    PCP - Dr. [name]. Cardiologist - Dr. Dixie Dials.  Chest x-ray -  EKG - 03/08/19 EPIC Stress Test -  ECHO -  Cardiac Cath -  Pacemaker/ICD device last checked:  Sleep Study -  CPAP -   Fasting Blood Sugar - 100's Checks Blood Sugar ___2__ times a day  Blood Thinner Instructions: Plavix will be hold since 01/20/20. As per Dr. Zenia Resides recommendation. Aspirin Instructions: Last Dose:  Anesthesia review: Hx: CHF,Stroke,HTN,DIA,Afib,CAD. Pt's left side weaker than right side,due to stroke.Pt. does not ambulate much,able to stand a walk few steps with walker.  Patient denies shortness of breath, fever, cough and chest pain at PAT appointment   Patient verbalized understanding of instructions that were given to them at the PAT appointment. Patient was also instructed that they will need to review over the PAT instructions again at home before surgery.

## 2020-01-23 ENCOUNTER — Encounter (HOSPITAL_COMMUNITY): Payer: Self-pay

## 2020-01-23 NOTE — Anesthesia Preprocedure Evaluation (Addendum)
Anesthesia Evaluation    Reviewed: Allergy & Precautions, H&P , Patient's Chart, lab work & pertinent test results  Airway Mallampati: III  TM Distance: >3 FB Neck ROM: Full    Dental no notable dental hx. (+) Chipped, Poor Dentition, Missing,    Pulmonary neg pulmonary ROS, former smoker,    Pulmonary exam normal breath sounds clear to auscultation       Cardiovascular Exercise Tolerance: Good hypertension, Pt. on medications + CAD and +CHF  Normal cardiovascular exam Rhythm:Regular Rate:Normal     Neuro/Psych CVA negative psych ROS   GI/Hepatic Neg liver ROS, hiatal hernia, GERD  Medicated,  Endo/Other  negative endocrine ROSdiabetes, Type 2, Insulin Dependent  Renal/GU Renal disease  negative genitourinary   Musculoskeletal   Abdominal   Peds  Hematology  (+) Blood dyscrasia, anemia ,   Anesthesia Other Findings   Reproductive/Obstetrics negative OB ROS                           Anesthesia Physical Anesthesia Plan  ASA: III  Anesthesia Plan: General   Post-op Pain Management:    Induction: Intravenous  PONV Risk Score and Plan: Ondansetron, Dexamethasone and Treatment may vary due to age or medical condition  Airway Management Planned: LMA and Oral ETT  Additional Equipment: None  Intra-op Plan:   Post-operative Plan:   Informed Consent: I have reviewed the patients History and Physical, chart, labs and discussed the procedure including the risks, benefits and alternatives for the proposed anesthesia with the patient or authorized representative who has indicated his/her understanding and acceptance.       Plan Discussed with: CRNA, Surgeon and Anesthesiologist  Anesthesia Plan Comments: (See APP note by Durel Salts, FNP  Pt is an 86 year old with history of CAD, CHF, atrial fibrillation, aortic stenosis (moderate by 08/25/18 echo), HTN, stroke, carotid artery stenosis (s/p  L CEA 2003), DM, CKD  - PCP/cardiologist is [name], MD. Last office visit 01/03/20. Dr. Terrence Dupont noted pt is at acceptable risk to proceed with surgery  EKG 03/08/19: Sinus rhythm. Low voltage, precordial leads. Abnormal R-wave progression, early transition. Baseline wander in lead(s) V6  Echo 08/25/18 (Dr. Zenia Resides office):  - Normal LV size. LV systolic function normal with normal EF 55%. Normal - LV contractility. LVH seen with abnormal diastolic function - Trace MR - Moderate aortic stenosis - Trace PR - Mild TR with RVSP estimated at 33 mmHg  Carotid artery duplex 01/29/17:  1. R ICA velocities suggest a high-end 1-39% stenosis, however velocities may be underestimated due to acoustic shadowing 2. Patent L CEA site without evidence of restenosis. Mild calcific plaque noted in the surgical bulb.   Nuclear stress test 10/31/16:  1. No reversible ischemia or infarction. 2. Normal left ventricular wall motion. 3. Left ventricular ejection fraction 70% 4. Non invasive risk stratification*: Low  )      Anesthesia Quick Evaluation

## 2020-01-23 NOTE — Progress Notes (Signed)
Anesthesia Chart Review:   Case: [id number] Date/Time: 01/24/20 0715   Procedure: CYSTOSCOPY WITH BLADDER BIOPSY AND FULGERATION (N/A )   Anesthesia type: General   Pre-op diagnosis: BLADDER LESION   Location: [facility] ROOM 07 / [facility]   Surgeons: Robley Fries, MD      DISCUSSION:  Pt is an 86 year old with history of CAD, CHF, atrial fibrillation, aortic stenosis (moderate by 08/25/18 echo), HTN, stroke, carotid artery stenosis (s/p L CEA 2003), DM, CKD  Reviewed case with Dr. Royce Macadamia.    VS: BP 120/62   Pulse (!) 56   Temp 37.2 C (Oral)   Ht '5\' 7"'  (1.702 m)   Wt 85.7 kg   SpO2 100%   BMI 29.60 kg/m   PROVIDERS: - PCP/cardiologist is [name], MD. Last office visit 01/03/20. Dr. Terrence Dupont noted pt is at acceptable risk to proceed with surgery  - Used to be seen by vascular surgeon Curt Jews, MD. At last office visit 01/29/17 with Clemon Chambers, NP, f/u in 1 year for repear carotid duplex recommended but this has not happened.    LABS: Labs reviewed: Acceptable for surgery. (all labs ordered are listed, but only abnormal results are displayed)  Labs Reviewed  BASIC METABOLIC PANEL - Abnormal; Notable for the following components:      Result Value   Glucose, Bld 191 (*)    BUN 41 (*)    Creatinine, Ser 1.33 (*)    GFR, Estimated 39 (*)    All other components within normal limits  HEMOGLOBIN A1C - Abnormal; Notable for the following components:   Hgb A1c MFr Bld 7.8 (*)    All other components within normal limits  GLUCOSE, CAPILLARY - Abnormal; Notable for the following components:   Glucose-Capillary 181 (*)    All other components within normal limits  CBC    EKG 03/08/19: Sinus rhythm. Low voltage, precordial leads. Abnormal R-wave progression, early transition. Baseline wander in lead(s) V6   CV: Echo 08/25/18 (Dr. Zenia Resides office):  - Normal LV size. LV systolic function normal with normal EF 55%. Normal - LV contractility. LVH seen with abnormal  diastolic function - Trace MR - Moderate aortic stenosis - Trace PR - Mild TR with RVSP estimated at 33 mmHg   Carotid artery duplex 01/29/17:  1. R ICA velocities suggest a high-end 1-39% stenosis, however velocities may be underestimated due to acoustic shadowing 2. Patent L CEA site without evidence of restenosis. Mild calcific plaque noted in the surgical bulb.    Nuclear stress test 10/31/16:  1. No reversible ischemia or infarction. 2. Normal left ventricular wall motion. 3. Left ventricular ejection fraction 70% 4. Non invasive risk stratification*: Low    Past Medical History:  Diagnosis Date  . Anemia    mild  . Anginal pain ([place])   . Aortic stenosis   . Atrial fibrillation ([place])   . Bowel obstruction ([place]) 2015   PSBO  . Bowel obstruction ([place]) 10/2017  . CAD (coronary artery disease)   . Carotid artery stenosis   . CHF (congestive heart failure) ([place])   . Chronic kidney disease    renal insufficiency - DR Florene Glen yearly  . Diabetes mellitus without complication ([place])    type 2  . Full dentures   . History of hiatal hernia   . Hypertension   . Mobility impaired    uses walker for ambulation  . Stroke [facility])    right sided weakness,walker  . Wears glasses  Past Surgical History:  Procedure Laterality Date  . ABDOMINAL HYSTERECTOMY    . angiodysplasia    . APPENDECTOMY    . BREAST DUCTAL SYSTEM EXCISION  03/02/2012   Procedure: EXCISION DUCTAL SYSTEM BREAST;  Surgeon: Adin Hector, MD;  Location: [facility];  Service: General;  Laterality: Left;  . BREAST EXCISIONAL BIOPSY    . BREAST SURGERY     bx rt and lt   . CATARACT EXTRACTION     left  . CHOLECYSTECTOMY    . COLONOSCOPY N/A 10/15/2012   Procedure: COLONOSCOPY;  Surgeon: Beryle Beams, MD;  [facility];  Service: Endoscopy;  Laterality: N/A;  . DILATION AND CURETTAGE OF UTERUS    . DVT    . ENTEROSCOPY N/A 11/17/2014   Procedure: ENTEROSCOPY;  Surgeon: Carol Ada, MD;  [facility];  Service: Endoscopy;  Laterality: N/A;  . ESOPHAGOGASTRODUODENOSCOPY N/A 09/10/2012   Procedure: ESOPHAGOGASTRODUODENOSCOPY (EGD);  Surgeon: Beryle Beams, MD;  [facility];  Service: Endoscopy;  Laterality: N/A;  . EYE SURGERY     right-glaucoma  . HOT HEMOSTASIS N/A 09/10/2012   Procedure: HOT HEMOSTASIS (ARGON PLASMA COAGULATION/BICAP);  Surgeon: Beryle Beams, MD;  Location: [facility];  Service: Endoscopy;  Laterality: N/A;  . HOT HEMOSTASIS N/A 11/17/2014   Procedure: HOT HEMOSTASIS (ARGON PLASMA COAGULATION/BICAP);  Surgeon: Carol Ada, MD;  Location: [facility];  Service: Endoscopy;  Laterality: N/A;  . TUBAL LIGATION      MEDICATIONS: . Accu-Chek FastClix Lancets MISC  . ACCU-CHEK GUIDE test strip  . amLODipine-valsartan (EXFORGE) 5-320 MG tablet  . atorvastatin (LIPITOR) 40 MG tablet  . bimatoprost (LUMIGAN) 0.01 % SOLN  . Blood Glucose Monitoring Suppl (ACCU-CHEK GUIDE) w/Device KIT  . brimonidine (ALPHAGAN P) 0.1 % SOLN  . cephALEXin (KEFLEX) 250 MG capsule  . clopidogrel (PLAVIX) 75 MG tablet  . dicyclomine (BENTYL) 10 MG capsule  . dorzolamide (TRUSOPT) 2 % ophthalmic solution  . ergocalciferol (VITAMIN D2) 50000 units capsule  . fentaNYL (DURAGESIC) 12 MCG/HR  . ferrous gluconate (FERGON) 324 MG tablet  . insulin aspart protamine- aspart (NOVOLOG MIX 70/30) (70-30) 100 UNIT/ML injection  . nitroGLYCERIN (NITRODUR - DOSED IN MG/24 HR) 0.4 mg/hr patch  . nitroGLYCERIN (NITROSTAT) 0.4 MG SL tablet  . omeprazole (PRILOSEC) 40 MG capsule  . potassium chloride SA (K-DUR,KLOR-CON) 20 MEQ tablet  . torsemide (DEMADEX) 20 MG tablet   No current facility-administered medications for this encounter.    If no changes, I anticipate pt can proceed with surgery as scheduled.   Willeen Cass, PhD, FNP-BC [facility] Short Stay Surgical Center/Anesthesiology Phone: [phone] 01/23/2020 3:38 PM

## 2020-01-23 NOTE — H&P (Signed)
CC/HPI: cc: Recurrent UTIs   01/09/20: Here for cystoscopy   12/08/19: 86 yo woman with hx of recurrent UTIs comes in for cystoscopy. She has been using nitrofurantoin for ppx. She overall today feels poorly with dysuria and suprapubic pain. Urine looks infected. Will postpone cystoscopy.   11/14/19: 86 year old woman with a history of chronic cystitis returns for follow-up. She also has significant urinary urgency and urge incontinence however with elevated PVRs unable to take medication. She has been treated with vaginal estrogen cream as well as 3 month course of low-dose Keflex for prophylaxis. She comes in today with reports of blood daily in her urine. She has also had some abdominal discomfort and discoloration around her prior surgical incision. Patient's urinalysis today is consistent with infection versus colonization. She has not had a cystoscopy before. It is unclear if patient is taking medication as prescribed.   08/18/19: 86 year old woman with a history of chronic cystitis and urinary urge incontinence returns for follow-up. She is placed on vaginal estrogen cream at her last visit to help with UTI prevention. Due to elevated PVR of 200 cc, I did not give her any medication for urinary urgency. Patient has been using the vaginal estrogen cream but does not feel like it is helping. She currently is experiencing vaginal pain and dysuria. She feels like she may have UTI.   07/07/19: 86 yo woman previously seen by Dr. Janice Norrie for urinary urge incontinence referred for frequent UTIs. Patient was last seen in 2016 and placed on VESIcare. Today she has a PVR of over 200 mL. Her urinalysis also appears to be infected today. Straight catheter was obtained to repeat the urinalysis and get true PVR which is approximately 200 cc. Patient reports urinary frequency, urgency and urge incontinence. She has also noticed some hematuria.     ALLERGIES: Betadine Contrast Dye Ibuprofen  TABS Penicillins Shellfish Tylenol TABS    MEDICATIONS: Metoprolol Tartrate 25 mg tablet  Alphagan P 0.1 % drops  Amlodipine-Valsartan 5 mg-320 mg tablet  Atorvastatin Calcium 40 mg tablet Oral  Cephalexin 250 mg capsule 1 capsule PO Q HS  Dicyclomine Hcl 10 mg capsule  Dorzolamide Hcl 2 % drops Ophthalmic  Loperamide 2 mg capsule Oral  Lumigan 0.01 % drops  Nitrofurantoin 50 mg capsule 1 capsule PO Q HS  Nitrostat 0.4 mg tablet, sublingual Sublingual  Novolog  Omeprazole 40 mg capsule,delayed release Oral  Potassium Chloride 20 meq tablet, ext release, particles/crystals Oral  Torsemide 20 mg tablet Oral     GU PSH: Catheterization For Collection Of Specimen, Single Patient, All Places Of Service - 08/18/2019, 07/07/2019 Catheterize For Residual - 08/18/2019 D&C Non-OB - 2016 Hysterectomy Unilat SO - 2016       PSH Notes: Eye Surgery, Cholecystectomy, Dilation And Curettage, Tubal Ligation, Appendectomy, Cataract Surgery, Breast Surgery, Hysterectomy   NON-GU PSH: Appendectomy - 2016 Breast Surgery Procedure - 2016 Cholecystectomy (open) - 2016 Tubal Ligation - 2016     GU PMH: Acute Cystitis/UTI - 12/16/2019, will obtain UTI + today, - 12/08/2019, I will send ampicllin (renally dosed) to patient's pharmacy for treatment of presumed UTI based on her last positive culture and sensitivities in epic., - 07/07/2019 Chronic cystitis (with hematuria) - 12/16/2019, will treat pt for UTI then resume nitrofurantoin ppx. would like to cysto patient while on abx but symptoms needs to improve first. will also recheck PVR at next visit, - 12/08/2019, - 11/14/2019, I gave patient a handout on recurrent urinary tract infections including prevention strategies  as well as recommended a probiotic with the mass and cranberry it. Also prescribed patient vaginal estrogen cream to use twice a week in the vagina., - 07/07/2019 Urge incontinence, Due to elevated PVRs do not feel comfortable giving OAB meds -  12/08/2019, - 11/14/2019, Unfortunately given patient's elevated postvoid residual volume I do not feel comfortable giving her any pharmacologic treatment. The side effects of the medication may worsen urinary retention and put the patient was for infection., - 07/07/2019 Gross hematuria - 11/29/2019, - 11/14/2019 Incontinence w/o Sensation, Urinary incontinence without sensory awareness - 2016 Urinary incontinence, Unspec, Urinary incontinence - 2016 Urinary Tract Inf, Unspec site, Urinary tract infection - 2016      PMH Notes:  2006-02-17 12:49:05 - Note: Ischemic Stroke   NON-GU PMH: Encounter for general adult medical examination without abnormal findings, Encounter for preventive health examination - 2016 Personal history of other diseases of the circulatory system, History of cardiac disorder - 2016, History of hypertension, - 2014 Personal history of other diseases of the musculoskeletal system and connective tissue, History of arthritis - 2016 Personal history of other mental and behavioral disorders, History of depression - 2016 Personal history of other specified conditions, History of heartburn - 2016 Cardiac murmur, unspecified, Murmurs - 2014 Gout, Gout - 2014 Personal history of other diseases of the digestive system, History of esophageal reflux - 2014 Personal history of other endocrine, nutritional and metabolic disease, History of diabetes mellitus - 2014    FAMILY HISTORY: Diabetes - Runs In Family Family Health Status Number - Runs In Family malignant neoplasm of breast - Runs In Family renal failure - Runs In Family   SOCIAL HISTORY: Marital Status: Divorced Preferred Language: English; Ethnicity: Not Hispanic Or Latino; Race: Black or African American Current Smoking Status: Patient does not smoke anymore. Has not smoked since 07/01/2001.   Tobacco Use Assessment Completed: Used Tobacco in last 30 days? Does not drink anymore.  Drinks 3 caffeinated drinks per day.      Notes: Former smoker, Mother deceased, Alcohol use, Father deceased, Number of children, Retired, Divorced, Caffeine use, Alcohol Use, Occupation:, Caffeine Use, Marital History - Widowed, Tobacco Use   REVIEW OF SYSTEMS:    GU Review Female:   Patient denies frequent urination, hard to postpone urination, burning /pain with urination, get up at night to urinate, leakage of urine, stream starts and stops, trouble starting your stream, have to strain to urinate, and being pregnant.  Gastrointestinal (Upper):   Patient denies nausea, vomiting, and indigestion/ heartburn.  Gastrointestinal (Lower):   Patient denies diarrhea and constipation.  Constitutional:   Patient denies fever, night sweats, weight loss, and fatigue.  Skin:   Patient denies skin rash/ lesion and itching.  Eyes:   Patient denies blurred vision and double vision.  Ears/ Nose/ Throat:   Patient denies sore throat and sinus problems.  Hematologic/Lymphatic:   Patient denies swollen glands and easy bruising.  Cardiovascular:   Patient denies leg swelling and chest pains.  Respiratory:   Patient denies cough and shortness of breath.  Endocrine:   Patient denies excessive thirst.  Musculoskeletal:   Patient denies back pain and joint pain.  Neurological:   Patient denies headaches and dizziness.  Psychologic:   Patient denies depression and anxiety.   VITAL SIGNS: None   GU PHYSICAL EXAMINATION:    Cervix: S/P Hysterectomy  Uterus: S/P Hysterectomy   MULTI-SYSTEM PHYSICAL EXAMINATION:    Constitutional: Well-nourished. No physical deformities. Normally developed. Good grooming.  Neck: Neck  symmetrical, not swollen. Normal tracheal position.  Respiratory: No labored breathing, no use of accessory muscles.   Cardiovascular: Normal temperature  Skin: No paleness, no jaundice, no cyanosis. No lesion, no ulcer, no rash.  Neurologic / Psychiatric: Oriented to time, oriented to place, oriented to person. No depression, no anxiety,  no agitation.  Gastrointestinal: No rigidity  Eyes: Normal conjunctivae. Normal eyelids.  Ears, Nose, Mouth, and Throat: Left ear no scars, no lesions, no masses. Right ear no scars, no lesions, no masses. Nose no scars, no lesions, no masses. Normal hearing. Normal lips.  Musculoskeletal: Normal gait and station of head and neck.     Complexity of Data:  Records Review:   POC Tool  Urine Test Review:   Urinalysis  Notes:                     09/16/2019: BUN 42, creatinine 1.43   PROCEDURES:         Flexible Cystoscopy - 52000  Risks, benefits, and some of the potential complications of the procedure were discussed at length with the patient including infection, bleeding, voiding discomfort, urinary retention, fever, chills, sepsis, and others. All questions were answered. Informed consent was obtained. Sterile technique and intraurethral analgesia were used.  Meatus:  Normal size. Normal location. Normal condition.  Urethra:  Normal  Ureteral Orifices:  Normal location. Normal size. Normal shape. Effluxed clear urine.  Bladder:  No trabeculation. Flat erythema with shaggy/sloughing mucosa right superior wall and inflammation of trigone      The lower urinary tract was carefully examined. The procedure was well-tolerated and without complications. Antibiotic instructions were given. Instructions were given to call the office immediately for bloody urine, difficulty urinating, urinary retention, painful or frequent urination, fever, chills, nausea, vomiting or other illness. The patient stated that she understood these instructions and would comply with them.         Urinalysis w/Scope Dipstick Dipstick Cont'd Micro  Color: Yellow Bilirubin: Neg mg/dL WBC/hpf: 10 - 20/hpf  Appearance: Cloudy Ketones: Neg mg/dL RBC/hpf: 3 - 10/hpf  Specific Gravity: 1.025 Blood: Neg ery/uL Bacteria: Few (10-25/hpf)  pH: <=5.0 Protein: Trace mg/dL Cystals: NS (Not Seen)  Glucose: Neg mg/dL Urobilinogen: 0.2  mg/dL Casts: NS (Not Seen)    Nitrites: Neg Trichomonas: Not Present    Leukocyte Esterase: 2+ leu/uL Mucous: Not Present      Epithelial Cells: 10 - 20/hpf      Yeast: Few (1 - 5/hpf)      Sperm: Not Present    ASSESSMENT:      ICD-10 Details  1 GU:   Chronic cystitis (with hematuria) - N30.21 Chronic, Stable  2   Bladder, 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 I think she would benefit from cystoscopy with bladder biopsy. We discussed the risks and benefits of this including but not limited to bleeding, infection, need for future treatment, damage to surrounding structures including urethra/bladder/ureters, need for Foley catheter, bladder perforation, infection and pain. Patient wishes to proceed and will be scheduled for the next available date.

## 2020-01-24 ENCOUNTER — Other Ambulatory Visit: Payer: Self-pay

## 2020-01-24 ENCOUNTER — Encounter (HOSPITAL_COMMUNITY): Payer: Self-pay | Admitting: Urology

## 2020-01-24 ENCOUNTER — Encounter (HOSPITAL_COMMUNITY)
Admission: RE | Disposition: A | Discharge status: Home / Self Care | Payer: Self-pay | Source: Home / Self Care | Attending: Urology

## 2020-01-24 ENCOUNTER — Ambulatory Visit (HOSPITAL_COMMUNITY): Payer: Medicare Other | Admitting: Emergency Medicine

## 2020-01-24 ENCOUNTER — Other Ambulatory Visit (HOSPITAL_COMMUNITY): Payer: Self-pay

## 2020-01-24 ENCOUNTER — Ambulatory Visit (HOSPITAL_COMMUNITY): Payer: Medicare Other | Admitting: Certified Registered Nurse Anesthetist

## 2020-01-24 ENCOUNTER — Ambulatory Visit (HOSPITAL_COMMUNITY)
Admission: RE | Admit: 2020-01-24 | Discharge: 2020-01-24 | Disposition: A | Discharge status: Home / Self Care | Payer: Medicare Other | Attending: Urology | Admitting: Urology

## 2020-01-24 DIAGNOSIS — Z79899 Other long term (current) drug therapy: Secondary | ICD-10-CM | POA: Diagnosis not present

## 2020-01-24 DIAGNOSIS — Z8673 Personal history of transient ischemic attack (TIA), and cerebral infarction without residual deficits: Secondary | ICD-10-CM | POA: Insufficient documentation

## 2020-01-24 DIAGNOSIS — Z794 Long term (current) use of insulin: Secondary | ICD-10-CM | POA: Insufficient documentation

## 2020-01-24 DIAGNOSIS — N3021 Other chronic cystitis with hematuria: Secondary | ICD-10-CM | POA: Diagnosis present

## 2020-01-24 DIAGNOSIS — R3915 Urgency of urination: Secondary | ICD-10-CM | POA: Diagnosis not present

## 2020-01-24 DIAGNOSIS — Z87440 Personal history of urinary (tract) infections: Secondary | ICD-10-CM | POA: Insufficient documentation

## 2020-01-24 DIAGNOSIS — Z87891 Personal history of nicotine dependence: Secondary | ICD-10-CM | POA: Insufficient documentation

## 2020-01-24 DIAGNOSIS — N3031 Trigonitis with hematuria: Secondary | ICD-10-CM | POA: Insufficient documentation

## 2020-01-24 HISTORY — PX: CYSTOSCOPY WITH BIOPSY: SHX5122

## 2020-01-24 LAB — GLUCOSE, CAPILLARY
Glucose-Capillary: 126 mg/dL — ABNORMAL HIGH (ref 70–99)
Glucose-Capillary: 115 mg/dL — ABNORMAL HIGH (ref 70–99)

## 2020-01-24 SURGERY — CYSTOSCOPY, WITH BIOPSY
Anesthesia: General

## 2020-01-24 MED ORDER — PROPOFOL 10 MG/ML IV BOLUS
INTRAVENOUS | Status: DC | PRN
  Administered 2020-01-24: 100 mg via INTRAVENOUS

## 2020-01-24 MED ORDER — CHLORHEXIDINE GLUCONATE 0.12 % MT SOLN
15.0000 mL | Freq: Once | OROMUCOSAL | Status: AC
  Administered 2020-01-24: 15 mL via OROMUCOSAL

## 2020-01-24 MED ORDER — DEXAMETHASONE SODIUM PHOSPHATE 10 MG/ML IJ SOLN
INTRAMUSCULAR | Status: AC
  Filled 2020-01-24: qty 1

## 2020-01-24 MED ORDER — ONDANSETRON HCL 4 MG/2ML IJ SOLN
INTRAMUSCULAR | Status: DC | PRN
  Administered 2020-01-24: 4 mg via INTRAVENOUS

## 2020-01-24 MED ORDER — ACETAMINOPHEN 160 MG/5ML PO SOLN: 325.0000 mg | ORAL | Status: DC | PRN

## 2020-01-24 MED ORDER — LACTATED RINGERS IV SOLN: INTRAVENOUS | Status: DC

## 2020-01-24 MED ORDER — FENTANYL CITRATE (PF) 100 MCG/2ML IJ SOLN: 25.0000 ug | INTRAMUSCULAR | Status: DC | PRN

## 2020-01-24 MED ORDER — LIDOCAINE 2% (20 MG/ML) 5 ML SYRINGE
INTRAMUSCULAR | Status: AC
  Filled 2020-01-24: qty 5

## 2020-01-24 MED ORDER — OXYCODONE HCL 5 MG PO TABS: 5.0000 mg | ORAL_TABLET | Freq: Once | ORAL | Status: DC | PRN

## 2020-01-24 MED ORDER — MEPERIDINE HCL 50 MG/ML IJ SOLN: 6.2500 mg | INTRAMUSCULAR | Status: DC | PRN

## 2020-01-24 MED ORDER — ONDANSETRON HCL 4 MG/2ML IJ SOLN: 4.0000 mg | Freq: Once | INTRAMUSCULAR | Status: DC | PRN

## 2020-01-24 MED ORDER — OXYCODONE HCL 5 MG/5ML PO SOLN: 5.0000 mg | Freq: Once | ORAL | Status: DC | PRN

## 2020-01-24 MED ORDER — SODIUM CHLORIDE 0.9 % IR SOLN
Status: DC | PRN
  Administered 2020-01-24: 3000 mL via INTRAVESICAL

## 2020-01-24 MED ORDER — SODIUM CHLORIDE 0.9 % IV SOLN
2.0000 g | INTRAVENOUS | Status: AC
  Administered 2020-01-24: 2 g via INTRAVENOUS
  Filled 2020-01-24: qty 20

## 2020-01-24 MED ORDER — FENTANYL CITRATE (PF) 100 MCG/2ML IJ SOLN
INTRAMUSCULAR | Status: DC | PRN
  Administered 2020-01-24: 25 ug via INTRAVENOUS

## 2020-01-24 MED ORDER — STERILE WATER FOR IRRIGATION IR SOLN
Status: DC | PRN
  Administered 2020-01-24: 3000 mL

## 2020-01-24 MED ORDER — ONDANSETRON HCL 4 MG/2ML IJ SOLN
INTRAMUSCULAR | Status: AC
  Filled 2020-01-24: qty 2

## 2020-01-24 MED ORDER — PHENYLEPHRINE HCL-NACL 20-0.9 MG/250ML-% IV SOLN
INTRAVENOUS | Status: DC | PRN
  Administered 2020-01-24: 25 ug/min via INTRAVENOUS

## 2020-01-24 MED ORDER — PROPOFOL 10 MG/ML IV BOLUS
INTRAVENOUS | Status: AC
  Filled 2020-01-24: qty 20

## 2020-01-24 MED ORDER — FENTANYL CITRATE (PF) 100 MCG/2ML IJ SOLN
INTRAMUSCULAR | Status: AC
  Filled 2020-01-24: qty 2

## 2020-01-24 MED ORDER — LIDOCAINE 2% (20 MG/ML) 5 ML SYRINGE
INTRAMUSCULAR | Status: DC | PRN
  Administered 2020-01-24: 100 mg via INTRAVENOUS

## 2020-01-24 MED ORDER — ACETAMINOPHEN 325 MG PO TABS: 325.0000 mg | ORAL_TABLET | ORAL | Status: DC | PRN

## 2020-01-24 MED ORDER — DEXAMETHASONE SODIUM PHOSPHATE 10 MG/ML IJ SOLN
INTRAMUSCULAR | Status: DC | PRN
  Administered 2020-01-24: 8 mg via INTRAVENOUS

## 2020-01-24 MED ORDER — PHENYLEPHRINE HCL (PRESSORS) 10 MG/ML IV SOLN
INTRAVENOUS | Status: AC
  Filled 2020-01-24: qty 2

## 2020-01-24 MED ORDER — ORAL CARE MOUTH RINSE: 15.0000 mL | Freq: Once | OROMUCOSAL | Status: AC

## 2020-01-24 SURGICAL SUPPLY — 17 items
BAG DRN RND TRDRP ANRFLXCHMBR (UROLOGICAL SUPPLIES)
BAG URINE DRAIN 2000ML AR STRL (UROLOGICAL SUPPLIES) IMPLANT
BAG URO CATCHER STRL LF (MISCELLANEOUS) ×3 IMPLANT
CLOTH BEACON ORANGE TIMEOUT ST (SAFETY) ×3 IMPLANT
ELECT COAG BIPOLAR CYL 1.2MMM (ELECTROSURGICAL)
ELECT REM PT RETURN 15FT ADLT (MISCELLANEOUS) ×3 IMPLANT
ELECTRODE COAG BIPLR CYL 1.2MM (ELECTROSURGICAL) IMPLANT
GLOVE BIO SURGEON STRL SZ 6.5 (GLOVE) ×2 IMPLANT
GLOVE BIO SURGEONS STRL SZ 6.5 (GLOVE) ×1
GOWN STRL REUS W/TWL LRG LVL3 (GOWN DISPOSABLE) ×3 IMPLANT
KIT TURNOVER KIT A (KITS) IMPLANT
LOOP CUT BIPOLAR 24F LRG (ELECTROSURGICAL) IMPLANT
MANIFOLD NEPTUNE II (INSTRUMENTS) ×3 IMPLANT
PACK CYSTO (CUSTOM PROCEDURE TRAY) ×3 IMPLANT
TUBING CONNECTING 10 (TUBING) ×2 IMPLANT
TUBING CONNECTING 10' (TUBING) ×1
TUBING UROLOGY SET (TUBING) ×3 IMPLANT

## 2020-01-24 NOTE — Interval H&P Note (Signed)
History and Physical Interval Note: Patient reports taking her antibiotics.  She is asymptomatic today.  01/24/2020 7:06 AM  Katherine [name]  has presented today for surgery, with the diagnosis of BLADDER LESION.  The various methods of treatment have been discussed with the patient and family. After consideration of risks, benefits and other options for treatment, the patient has consented to  Procedure(s): [place] (N/A) as a surgical intervention.  The patient's history has been reviewed, patient examined, no change in status, stable for surgery.  I have reviewed the patient's chart and labs.  Questions were answered to the patient's satisfaction.     Katherine [name] D Katherine [name]

## 2020-01-24 NOTE — Discharge Instructions (Signed)

## 2020-01-24 NOTE — Anesthesia Procedure Notes (Signed)
Procedure Name: LMA Insertion Date/Time: 01/24/2020 7:28 AM Performed by: Victoriano Lain, CRNA Pre-anesthesia Checklist: Patient identified, Emergency Drugs available, Suction available, Patient being monitored and Timeout performed Patient Re-evaluated:Patient Re-evaluated prior to induction Oxygen Delivery Method: Circle system utilized Preoxygenation: Pre-oxygenation with 100% oxygen Induction Type: IV induction LMA: LMA with gastric port inserted LMA Size: 4.0 Number of attempts: 1 Placement Confirmation: positive ETCO2 and breath sounds checked- equal and bilateral Tube secured with: Tape Dental Injury: Teeth and Oropharynx as per pre-operative assessment

## 2020-01-24 NOTE — Op Note (Signed)
PATIENT:  Katherine [name]  PRE-OPERATIVE DIAGNOSIS: bladder lesion  POST-OPERATIVE DIAGNOSIS: Same  PROCEDURE: cystoscopy, bladder biopsy, fulguration of abnormal bladder mucosa  SURGEON:  Jacalyn Lefevre, MD  INDICATION: Katherine [name] is a 86 year old woman seen for recurrent UTIs found to have bladder lesion on cystoscopy in the office.  ANESTHESIA:  General  EBL:  Minimal  DRAINS: None  LOCAL MEDICATIONS USED:  None  SPECIMEN: Trigone bladder biopsy  Findings: 1.  Normal urethra 2.  Bilateral orthotopic ureteral orifices effluxing clear urine 3.  Abnormal bladder mucosa encompassing trigone and floor of bladder that appeared bumpy in nature 4.   Bladder biopsy taken from center of trigone  Description of procedure: After informed consent the patient was taken to the operating room and placed on the table in a supine position. General anesthesia was then administered. Once fully anesthetized the patient was moved to the dorsal lithotomy position and the genitalia were sterilely prepped and draped in standard fashion. An official timeout was then performed.  A 21 French rigid cystoscope was placed in the urethral meatus and advanced in the bladder and direct visualization.  Cystoscopy took place.  The bladder lesion was noted to be at the trigone.  Full inspection of the bladder revealed no other abnormalities.  She had bilateral orthotopic ureteral orifices that effluxed clear urine.  Next a cold cup bladder biopsy was taken from the abnormal bladder mucosa in the center of the trigone.  A Bugbee was used to then fulgurate and achieve hemostasis.  The bladder lesion encompassed the floor of the bladder as well as the trigone approximately 2 to 3 cm in length.  Hemostasis was adequate with the irrigant turned off.  The patient's bladder was decompressed.  The cystoscope was removed.  Patient was awakened from anesthesia and transferred the PACU in stable condition.  PLAN OF CARE:  Discharge to home after PACU  PATIENT DISPOSITION:  PACU - hemodynamically stable.

## 2020-01-24 NOTE — Transfer of Care (Signed)
Immediate Anesthesia Transfer of Care Note  Patient: Katherine [name]  Procedure(s) Performed: CYSTOSCOPY WITH BLADDER BIOPSY AND FULGERATION (N/A )  Patient Location: PACU  Anesthesia Type:General  Level of Consciousness: awake, alert , oriented and patient cooperative  Airway & Oxygen Therapy: Patient Spontanous Breathing and Patient connected to face mask oxygen  Post-op Assessment: Report given to RN, Post -op Vital signs reviewed and stable and Patient moving all extremities  Post vital signs: Reviewed and stable  Last Vitals:  Vitals Value Taken Time  BP 144/107 01/24/20 0758  Temp    Pulse 59 01/24/20 0800  Resp 17 01/24/20 0800  SpO2 100 % 01/24/20 0800  Vitals shown include unvalidated device data.  Last Pain:  Vitals:   01/24/20 0620  TempSrc:   PainSc: 0-No pain      Patients Stated Pain Goal: 4 (82/95/62 1308)  Complications: No complications documented.

## 2020-01-25 ENCOUNTER — Encounter (HOSPITAL_COMMUNITY): Payer: Self-pay | Admitting: Urology

## 2020-01-25 LAB — SURGICAL PATHOLOGY

## 2020-01-25 MED ORDER — OMEPRAZOLE 40 MG PO CPDR: 40.0000 mg | DELAYED_RELEASE_CAPSULE | Freq: Every day | ORAL | 3 refills | Status: AC

## 2020-01-25 NOTE — Anesthesia Postprocedure Evaluation (Signed)
Anesthesia Post Note  Patient: Katherine [name]  Procedure(s) Performed: CYSTOSCOPY WITH BLADDER BIOPSY AND FULGERATION (N/A )     Patient location during evaluation: PACU Anesthesia Type: General Level of consciousness: awake and alert Pain management: pain level controlled Vital Signs Assessment: post-procedure vital signs reviewed and stable Respiratory status: spontaneous breathing, nonlabored ventilation, respiratory function stable and patient connected to nasal cannula oxygen Cardiovascular status: blood pressure returned to baseline and stable Postop Assessment: no apparent nausea or vomiting Anesthetic complications: no   No complications documented.  Last Vitals:  Vitals:   01/24/20 0829 01/24/20 0845  BP: 140/72 (!) 156/75  Pulse: 64 62  Resp: 16 16  Temp: 36.4 C   SpO2: 99% 99%    Last Pain:  Vitals:   01/24/20 0845  TempSrc:   PainSc: 0-No pain                 Kelcey Korus

## 2020-02-01 ENCOUNTER — Emergency Department (HOSPITAL_COMMUNITY): Payer: Medicare Other

## 2020-02-01 ENCOUNTER — Other Ambulatory Visit: Payer: Self-pay

## 2020-02-01 ENCOUNTER — Inpatient Hospital Stay (HOSPITAL_COMMUNITY)
Admission: EM | Admit: 2020-02-01 | Discharge: 2020-02-05 | DRG: 149 | Disposition: A | Discharge status: Home Health | Payer: Medicare Other | Attending: Internal Medicine | Admitting: Internal Medicine

## 2020-02-01 DIAGNOSIS — N329 Bladder disorder, unspecified: Secondary | ICD-10-CM | POA: Diagnosis present

## 2020-02-01 DIAGNOSIS — Z87891 Personal history of nicotine dependence: Secondary | ICD-10-CM

## 2020-02-01 DIAGNOSIS — I2510 Atherosclerotic heart disease of native coronary artery without angina pectoris: Secondary | ICD-10-CM | POA: Diagnosis present

## 2020-02-01 DIAGNOSIS — I679 Cerebrovascular disease, unspecified: Secondary | ICD-10-CM

## 2020-02-01 DIAGNOSIS — Z91041 Radiographic dye allergy status: Secondary | ICD-10-CM

## 2020-02-01 DIAGNOSIS — Z792 Long term (current) use of antibiotics: Secondary | ICD-10-CM

## 2020-02-01 DIAGNOSIS — I10 Essential (primary) hypertension: Secondary | ICD-10-CM | POA: Diagnosis present

## 2020-02-01 DIAGNOSIS — R42 Dizziness and giddiness: Secondary | ICD-10-CM

## 2020-02-01 DIAGNOSIS — S46912A Strain of unspecified muscle, fascia and tendon at shoulder and upper arm level, left arm, initial encounter: Secondary | ICD-10-CM

## 2020-02-01 DIAGNOSIS — D509 Iron deficiency anemia, unspecified: Secondary | ICD-10-CM | POA: Diagnosis present

## 2020-02-01 DIAGNOSIS — I6521 Occlusion and stenosis of right carotid artery: Secondary | ICD-10-CM | POA: Diagnosis present

## 2020-02-01 DIAGNOSIS — I69398 Other sequelae of cerebral infarction: Secondary | ICD-10-CM

## 2020-02-01 DIAGNOSIS — I63219 Cerebral infarction due to unspecified occlusion or stenosis of unspecified vertebral arteries: Secondary | ICD-10-CM | POA: Diagnosis not present

## 2020-02-01 DIAGNOSIS — R4701 Aphasia: Secondary | ICD-10-CM | POA: Diagnosis present

## 2020-02-01 DIAGNOSIS — I651 Occlusion and stenosis of basilar artery: Secondary | ICD-10-CM | POA: Diagnosis present

## 2020-02-01 DIAGNOSIS — E1165 Type 2 diabetes mellitus with hyperglycemia: Secondary | ICD-10-CM | POA: Diagnosis present

## 2020-02-01 DIAGNOSIS — H8110 Benign paroxysmal vertigo, unspecified ear: Secondary | ICD-10-CM | POA: Diagnosis not present

## 2020-02-01 DIAGNOSIS — I480 Paroxysmal atrial fibrillation: Secondary | ICD-10-CM | POA: Diagnosis present

## 2020-02-01 DIAGNOSIS — I130 Hypertensive heart and chronic kidney disease with heart failure and stage 1 through stage 4 chronic kidney disease, or unspecified chronic kidney disease: Secondary | ICD-10-CM | POA: Diagnosis present

## 2020-02-01 DIAGNOSIS — Z8249 Family history of ischemic heart disease and other diseases of the circulatory system: Secondary | ICD-10-CM

## 2020-02-01 DIAGNOSIS — I5032 Chronic diastolic (congestive) heart failure: Secondary | ICD-10-CM | POA: Diagnosis present

## 2020-02-01 DIAGNOSIS — R200 Anesthesia of skin: Secondary | ICD-10-CM | POA: Diagnosis present

## 2020-02-01 DIAGNOSIS — Z79891 Long term (current) use of opiate analgesic: Secondary | ICD-10-CM

## 2020-02-01 DIAGNOSIS — G459 Transient cerebral ischemic attack, unspecified: Secondary | ICD-10-CM

## 2020-02-01 DIAGNOSIS — I6322 Cerebral infarction due to unspecified occlusion or stenosis of basilar arteries: Secondary | ICD-10-CM | POA: Diagnosis not present

## 2020-02-01 DIAGNOSIS — M25512 Pain in left shoulder: Secondary | ICD-10-CM | POA: Diagnosis present

## 2020-02-01 DIAGNOSIS — Z79899 Other long term (current) drug therapy: Secondary | ICD-10-CM

## 2020-02-01 DIAGNOSIS — E669 Obesity, unspecified: Secondary | ICD-10-CM | POA: Diagnosis present

## 2020-02-01 DIAGNOSIS — N3289 Other specified disorders of bladder: Secondary | ICD-10-CM | POA: Diagnosis present

## 2020-02-01 DIAGNOSIS — Z886 Allergy status to analgesic agent status: Secondary | ICD-10-CM

## 2020-02-01 DIAGNOSIS — Z90710 Acquired absence of both cervix and uterus: Secondary | ICD-10-CM

## 2020-02-01 DIAGNOSIS — Z7902 Long term (current) use of antithrombotics/antiplatelets: Secondary | ICD-10-CM

## 2020-02-01 DIAGNOSIS — R319 Hematuria, unspecified: Secondary | ICD-10-CM | POA: Diagnosis present

## 2020-02-01 DIAGNOSIS — Z20822 Contact with and (suspected) exposure to covid-19: Secondary | ICD-10-CM | POA: Diagnosis present

## 2020-02-01 DIAGNOSIS — Z6829 Body mass index (BMI) 29.0-29.9, adult: Secondary | ICD-10-CM

## 2020-02-01 DIAGNOSIS — H5340 Unspecified visual field defects: Secondary | ICD-10-CM | POA: Diagnosis present

## 2020-02-01 DIAGNOSIS — E875 Hyperkalemia: Secondary | ICD-10-CM | POA: Diagnosis present

## 2020-02-01 DIAGNOSIS — E785 Hyperlipidemia, unspecified: Secondary | ICD-10-CM | POA: Diagnosis present

## 2020-02-01 DIAGNOSIS — E1122 Type 2 diabetes mellitus with diabetic chronic kidney disease: Secondary | ICD-10-CM | POA: Diagnosis present

## 2020-02-01 DIAGNOSIS — K219 Gastro-esophageal reflux disease without esophagitis: Secondary | ICD-10-CM | POA: Diagnosis present

## 2020-02-01 DIAGNOSIS — Z794 Long term (current) use of insulin: Secondary | ICD-10-CM

## 2020-02-01 DIAGNOSIS — I350 Nonrheumatic aortic (valve) stenosis: Secondary | ICD-10-CM | POA: Diagnosis present

## 2020-02-01 DIAGNOSIS — N1831 Chronic kidney disease, stage 3a: Secondary | ICD-10-CM | POA: Diagnosis present

## 2020-02-01 DIAGNOSIS — H409 Unspecified glaucoma: Secondary | ICD-10-CM | POA: Diagnosis present

## 2020-02-01 DIAGNOSIS — R29810 Facial weakness: Secondary | ICD-10-CM | POA: Diagnosis present

## 2020-02-01 LAB — CBC WITH DIFFERENTIAL/PLATELET
Abs Immature Granulocytes: 0.02 10*3/uL (ref 0.00–0.07)
Basophils Absolute: 0 10*3/uL (ref 0.0–0.1)
Basophils Relative: 1 %
Eosinophils Absolute: 0.2 10*3/uL (ref 0.0–0.5)
Eosinophils Relative: 4 %
HCT: 40.3 % (ref 36.0–46.0)
Hemoglobin: 12.2 g/dL (ref 12.0–15.0)
Immature Granulocytes: 0 %
Lymphocytes Relative: 47 %
Lymphs Abs: 2.6 10*3/uL (ref 0.7–4.0)
MCH: 27.8 pg (ref 26.0–34.0)
MCHC: 30.3 g/dL (ref 30.0–36.0)
MCV: 91.8 fL (ref 80.0–100.0)
Monocytes Absolute: 0.5 10*3/uL (ref 0.1–1.0)
Monocytes Relative: 9 %
Neutro Abs: 2.1 10*3/uL (ref 1.7–7.7)
Neutrophils Relative %: 39 %
Platelets: 227 10*3/uL (ref 150–400)
RBC: 4.39 MIL/uL (ref 3.87–5.11)
RDW: 12.9 % (ref 11.5–15.5)
WBC: 5.5 10*3/uL (ref 4.0–10.5)
nRBC: 0 % (ref 0.0–0.2)

## 2020-02-01 LAB — MAGNESIUM: Magnesium: 2.3 mg/dL (ref 1.7–2.4)

## 2020-02-01 LAB — BASIC METABOLIC PANEL
Anion gap: 14 (ref 5–15)
BUN: 22 mg/dL (ref 8–23)
CO2: 23 mmol/L (ref 22–32)
Calcium: 9.7 mg/dL (ref 8.9–10.3)
Chloride: 104 mmol/L (ref 98–111)
Creatinine, Ser: 1.18 mg/dL — ABNORMAL HIGH (ref 0.44–1.00)
GFR, Estimated: 45 mL/min — ABNORMAL LOW (ref >60)
Glucose, Bld: 158 mg/dL — ABNORMAL HIGH (ref 70–99)
Potassium: 5.2 mmol/L — ABNORMAL HIGH (ref 3.5–5.1)
Sodium: 141 mmol/L (ref 135–145)

## 2020-02-01 MED ORDER — GADOBUTROL 1 MMOL/ML IV SOLN
8.5000 mL | Freq: Once | INTRAVENOUS | Status: AC | PRN
  Administered 2020-02-01: 8.5 mL via INTRAVENOUS

## 2020-02-01 MED ORDER — ACETAMINOPHEN 500 MG PO TABS: 1000.0000 mg | ORAL_TABLET | Freq: Once | ORAL | Status: DC

## 2020-02-01 NOTE — ED Notes (Signed)
Pt unable to ambulate self to pt restroom, pt able to ambulate with x2 assist. Pt appears unsteady on feet, concern for fall risk. Ron Parker, MD made aware.

## 2020-02-01 NOTE — ED Provider Notes (Addendum)
[facility] EMERGENCY DEPARTMENT Provider Note   CSN: [id number] Arrival date & time: 02/01/20  1434     History Chief Complaint  Patient presents with  . Dizziness    Katherine [name] is a 86 y.o. female.  Patient started having difficulty ambulating today at 8 AM.  Felt as though her head of the room was spinning.  Felt that she had to hold herself up with a railing.  Has history of stroke with chronic deficits of weakness and numbness but able to ambulate at baseline.  Today unable to ambulate due to dizziness.  No headache, no fevers chills or sick symptoms.  No sick contacts no trauma.  Taking medications as prescribed.        Past Medical History:  Diagnosis Date  . Anemia    mild  . Anginal pain ([place])   . Aortic stenosis   . Atrial fibrillation ([place])   . Bowel obstruction ([place]) 2015   PSBO  . Bowel obstruction ([place]) 10/2017  . CAD (coronary artery disease)   . Carotid artery stenosis   . CHF (congestive heart failure) ([place])   . Chronic kidney disease    renal insufficiency - DR Florene Glen yearly  . Diabetes mellitus without complication ([place])    type 2  . Full dentures   . History of hiatal hernia   . Hypertension   . Mobility impaired    uses walker for ambulation  . Stroke [facility])    right sided weakness,walker  . Wears glasses     Patient Active Problem List   Diagnosis Date Noted  . Small bowel obstruction ([place]) 10/31/2017  . Abdominal pain   . Nausea and vomiting   . Acute colitis 05/30/2017  . Chest pain 12/21/2015  . ACS (acute coronary syndrome) ([place]) 12/21/2015  . Bowel obstruction ([place]) 10/15/2013  . Angioedema due to seafood allergy 09/11/2013  . Nipple discharge, bloody 01/07/2012    Past Surgical History:  Procedure Laterality Date  . ABDOMINAL HYSTERECTOMY    . angiodysplasia    . APPENDECTOMY    . BREAST DUCTAL SYSTEM EXCISION  03/02/2012   Procedure: EXCISION DUCTAL SYSTEM BREAST;  Surgeon: Adin Hector, MD;   Location: [facility];  Service: General;  Laterality: Left;  . BREAST EXCISIONAL BIOPSY    . BREAST SURGERY     bx rt and lt   . CATARACT EXTRACTION     left  . CHOLECYSTECTOMY    . COLONOSCOPY N/A 10/15/2012   Procedure: COLONOSCOPY;  Surgeon: Beryle Beams, MD;  Location: [facility];  Service: Endoscopy;  Laterality: N/A;  . CYSTOSCOPY WITH BIOPSY N/A 01/24/2020   Procedure: CYSTOSCOPY WITH BLADDER BIOPSY AND FULGERATION;  Surgeon: Robley Fries, MD;  Location: [facility];  Service: Urology;  Laterality: N/A;  . DILATION AND CURETTAGE OF UTERUS    . DVT    . ENTEROSCOPY N/A 11/17/2014   Procedure: ENTEROSCOPY;  Surgeon: Carol Ada, MD;  Location: [facility];  Service: Endoscopy;  Laterality: N/A;  . ESOPHAGOGASTRODUODENOSCOPY N/A 09/10/2012   Procedure: ESOPHAGOGASTRODUODENOSCOPY (EGD);  Surgeon: Beryle Beams, MD;  Location: [facility];  Service: Endoscopy;  Laterality: N/A;  . EYE SURGERY     right-glaucoma  . HOT HEMOSTASIS N/A 09/10/2012   Procedure: HOT HEMOSTASIS (ARGON PLASMA COAGULATION/BICAP);  Surgeon: Beryle Beams, MD;  Location: [facility];  Service: Endoscopy;  Laterality: N/A;  . HOT HEMOSTASIS N/A 11/17/2014   Procedure: HOT HEMOSTASIS (ARGON PLASMA COAGULATION/BICAP);  Surgeon: Carol Ada, MD;  Location: [facility];  Service: Endoscopy;  Laterality: N/A;  . TUBAL LIGATION       OB History   No obstetric history on file.     Family History  Problem Relation Age of Onset  . Heart disease Sister   . Heart disease Brother   . Heart disease Mother   . Heart disease Father   . Heart disease Daughter     Social History   Tobacco Use  . Smoking status: Former Smoker    Quit date: 02/25/2002    Years since quitting: 17.9  . Smokeless tobacco: Never Used  Vaping Use  . Vaping Use: Never used  Substance Use Topics  . Alcohol use: No  . Drug use: No    Home Medications Prior to Admission medications   Medication Sig Start Date  End Date Taking? Authorizing Provider  Accu-Chek FastClix Lancets MISC daily. 10/18/18   [provider]  ACCU-CHEK GUIDE test strip 1 strip daily. 10/18/18   [provider]  amLODipine-valsartan (EXFORGE) 5-320 MG tablet Take 1 tablet by mouth daily.  01/14/16   [provider]  atorvastatin (LIPITOR) 40 MG tablet Take 40 mg by mouth daily.    [provider]  bimatoprost (LUMIGAN) 0.01 % SOLN Place 1 drop into both eyes at bedtime.    [provider]  Blood Glucose Monitoring Suppl (ACCU-CHEK GUIDE) w/Device KIT daily.  10/18/18   [provider]  brimonidine (ALPHAGAN P) 0.1 % SOLN Place 1 drop into both eyes 3 (three) times daily.     [provider]  cephALEXin (KEFLEX) 250 MG capsule Take 250 mg by mouth at bedtime.     [provider]  clopidogrel (PLAVIX) 75 MG tablet Take 1 tablet (75 mg total) by mouth daily. 06/04/17   [name], MD  dicyclomine (BENTYL) 10 MG capsule Take 10 mg by mouth 3 (three) times daily.  08/09/18   [provider]  dorzolamide (TRUSOPT) 2 % ophthalmic solution Place 1 drop into both eyes 3 (three) times daily.  08/09/18   [provider]  ergocalciferol (VITAMIN D2) 50000 units capsule Take 50,000 Units by mouth every Saturday.     [provider]  fentaNYL (DURAGESIC) 12 MCG/HR Place 1 patch onto the skin every 3 (three) days. 11/10/18   [provider]  ferrous gluconate (FERGON) 324 MG tablet Take 324 mg by mouth daily. 10/26/18   [provider]  insulin aspart protamine- aspart (NOVOLOG MIX 70/30) (70-30) 100 UNIT/ML injection Inject 0.2 mLs (20 Units total) into the skin 2 (two) times daily with a meal. Inject 20 units subcutaneously every morning and 10 units at night Patient taking differently: Inject 20-45 Units into the skin See admin instructions. Inject 45 units in the morning and 20 units at night 09/10/18   [name], MD  nitroGLYCERIN  (NITRODUR - DOSED IN MG/24 HR) 0.4 mg/hr patch Place 0.4 mg onto the skin daily.  01/21/16   [provider]  nitroGLYCERIN (NITROSTAT) 0.4 MG SL tablet Place 1 tablet (0.4 mg total) under the tongue every 5 (five) minutes as needed for chest pain (CP or SOB). 12/23/15   [name], MD  omeprazole (PRILOSEC) 40 MG capsule Take 1 capsule (40 mg total) by mouth daily. 01/25/20   [name], MD  potassium chloride SA (K-DUR,KLOR-CON) 20 MEQ tablet Take 0.5 tablets (10 mEq total) by mouth daily. Patient taking differently: Take 20 mEq by mouth daily.  11/07/17   Dixie Dials, MD  torsemide (DEMADEX) 20 MG tablet Take 20 mg by mouth daily. 05/02/19   [provider]    Allergies    Aspirin, Blue dyes (parenteral), Ibuprofen, Contrast media [iodinated diagnostic agents], Iohexol, and Shellfish-derived products  Review of Systems   Review of Systems  Constitutional: Negative for chills and fever.  HENT: Negative for congestion and rhinorrhea.   Respiratory: Negative for cough and shortness of breath.   Cardiovascular: Negative for chest pain and palpitations.  Gastrointestinal: Negative for diarrhea, nausea and vomiting.  Genitourinary: Negative for difficulty urinating and dysuria.  Musculoskeletal: Negative for arthralgias and back pain.  Skin: Negative for rash and wound.  Neurological: Positive for dizziness. Negative for light-headedness and headaches.    Physical Exam Updated Vital Signs BP (!) 153/56 (BP Location: Left Arm)   Pulse 62   Temp 98.4 F (36.9 C) (Oral)   Resp 14   SpO2 96%   Physical Exam Vitals and nursing note reviewed. Exam conducted with a chaperone present.  Constitutional:      General: She is not in acute distress.    Appearance: Normal appearance.  HENT:     Head: Normocephalic and atraumatic.     Nose: No rhinorrhea.  Eyes:     General:        Right eye: No discharge.        Left eye: No discharge.     Conjunctiva/sclera:  Conjunctivae normal.  Cardiovascular:     Rate and Rhythm: Normal rate and regular rhythm.  Pulmonary:     Effort: Pulmonary effort is normal. No respiratory distress.     Breath sounds: No stridor.  Abdominal:     General: Abdomen is flat. There is no distension.     Palpations: Abdomen is soft.  Musculoskeletal:        General: No tenderness or signs of injury.  Skin:    General: Skin is warm and dry.  Neurological:     Mental Status: She is alert.     Comments: No dysmetria, no pronator drift, equal strength in upper and lower extremities, reported decreased sensation on the right, she is able to fix her gaze on my face as I turn her head left and right.  Psychiatric:        Mood and Affect: Mood normal.        Behavior: Behavior normal.     ED Results / Procedures / Treatments   Labs (all labs ordered are listed, but only abnormal results are displayed) Labs Reviewed  BASIC METABOLIC PANEL - Abnormal; Notable for the following components:      Result Value   Potassium 5.2 (*)    Glucose, Bld 158 (*)    Creatinine, Ser 1.18 (*)    GFR, Estimated 45 (*)    All other components within normal limits  CBC WITH DIFFERENTIAL/PLATELET  MAGNESIUM  URINALYSIS, ROUTINE W REFLEX MICROSCOPIC    EKG EKG Interpretation  Date/Time:  Wednesday February 01 2020 14:45:02 EST Ventricular Rate:  62 PR Interval:  162 QRS Duration: 68 QT Interval:  404 QTC Calculation: 410 R Axis:   4 Text Interpretation: Normal sinus rhythm Low voltage QRS Borderline ECG Confirmed by Fredia Sorrow [id number]) on 02/01/2020 3:04:14 PM Also confirmed by Dewaine Conger [phone])  on 02/01/2020 4:13:14 PM   Radiology CT Head Wo Contrast  Result Date: 02/01/2020 CLINICAL DATA:  Dizziness, sudden onset beginning this morning. History of strokes. Patient takes Plavix. EXAM: CT  HEAD WITHOUT CONTRAST TECHNIQUE: Contiguous axial images were obtained from the base of the skull through the vertex without intravenous  contrast. COMPARISON:  11/23/2004 FINDINGS: Brain: No evidence of acute infarction, hemorrhage, hydrocephalus, extra-axial collection or mass lesion/mass effect. Old left posterior MCA distribution infarct, stable. Vascular: No hyperdense vessel or unexpected calcification. Skull: Normal. Negative for fracture or focal lesion. Sinuses/Orbits: Globes and orbits are unremarkable. Sinuses and mastoid air cells are clear. Other: None. IMPRESSION: 1. No acute intracranial abnormalities. 2. Old left posterior MCA distribution infarct. Electronically Signed   By: Lajean Manes M.D.   On: 02/01/2020 17:36   MR ANGIO HEAD WO CONTRAST  Result Date: 02/01/2020 CLINICAL DATA:  Sudden onset dizziness EXAM: MR HEAD WITHOUT CONTRAST MR CIRCLE OF WILLIS WITHOUT CONTRAST MRA OF THE NECK WITHOUT AND WITH CONTRAST TECHNIQUE: Multiplanar, multiecho pulse sequences of the brain, circle of willis and surrounding structures were obtained without intravenous contrast. Angiographic images of the neck were obtained using MRA technique without and with intravenous contrast. CONTRAST:  8.9m GADAVIST GADOBUTROL 1 MMOL/ML IV SOLN COMPARISON:  None. FINDINGS: MR HEAD FINDINGS Brain: No acute infarct, acute hemorrhage or extra-axial collection. There is a large, old posterior left MCA territory infarct. Early confluent hyperintense T2-weighted signal of the periventricular and deep white matter, most commonly due to chronic ischemic microangiopathy. Generalized volume loss. No chronic microhemorrhage. There is a focus of hyperintense T1-weighted signal within the pituitary gland (series 10, image 12). Vascular: Major flow voids are preserved. Skull and upper cervical spine: Normal calvarium and skull base. Visualized upper cervical spine and soft tissues are normal. Sinuses/Orbits:No paranasal sinus fluid levels or advanced mucosal thickening. No mastoid or middle ear effusion. Normal orbits. MR CIRCLE OF WILLIS FINDINGS POSTERIOR  CIRCULATION: --Vertebral arteries: Normal --Inferior cerebellar arteries: Left PICA is normal. Right PICA is not clearly visualized. --Basilar artery: Moderate-to-severe stenosis of the distal basilar artery. --Superior cerebellar arteries: Normal. --Posterior cerebral arteries: Normal. ANTERIOR CIRCULATION: --Intracranial internal carotid arteries: Normal. --Anterior cerebral arteries (ACA): Normal. --Middle cerebral arteries (MCA): Normal. ANATOMIC VARIANTS: None MRA NECK FINDINGS Vertebral arteries are codominant. They are both normal to the vertebrobasilar confluence. There is approximately 60% stenosis of the proximal right internal carotid artery. Normal left carotid system. Normal 3 vessel aortic branching pattern. IMPRESSION: 1. No acute intracranial abnormality. 2. Large, old posterior left MCA territory infarct and findings of chronic ischemic microangiopathy. 3. No emergent large vessel occlusion. 4. Moderate-to-severe stenosis of the distal basilar artery. 5. Approximately 60% stenosis of the proximal right internal carotid artery. 6. Focus of hyperintense T1-weighted signal within the pituitary gland, which may be a proteinaceous cyst. Electronically Signed   By: KUlyses JarredM.D.   On: 02/01/2020 23:01   MR ANGIO NECK W WO CONTRAST  Result Date: 02/01/2020 CLINICAL DATA:  Sudden onset dizziness EXAM: MR HEAD WITHOUT CONTRAST MR CIRCLE OF WILLIS WITHOUT CONTRAST MRA OF THE NECK WITHOUT AND WITH CONTRAST TECHNIQUE: Multiplanar, multiecho pulse sequences of the brain, circle of willis and surrounding structures were obtained without intravenous contrast. Angiographic images of the neck were obtained using MRA technique without and with intravenous contrast. CONTRAST:  8.563mGADAVIST GADOBUTROL 1 MMOL/ML IV SOLN COMPARISON:  None. FINDINGS: MR HEAD FINDINGS Brain: No acute infarct, acute hemorrhage or extra-axial collection. There is a large, old posterior left MCA territory infarct. Early confluent  hyperintense T2-weighted signal of the periventricular and deep white matter, most commonly due to chronic ischemic microangiopathy. Generalized volume loss. No chronic microhemorrhage. There is a  focus of hyperintense T1-weighted signal within the pituitary gland (series 10, image 12). Vascular: Major flow voids are preserved. Skull and upper cervical spine: Normal calvarium and skull base. Visualized upper cervical spine and soft tissues are normal. Sinuses/Orbits:No paranasal sinus fluid levels or advanced mucosal thickening. No mastoid or middle ear effusion. Normal orbits. MR CIRCLE OF WILLIS FINDINGS POSTERIOR CIRCULATION: --Vertebral arteries: Normal --Inferior cerebellar arteries: Left PICA is normal. Right PICA is not clearly visualized. --Basilar artery: Moderate-to-severe stenosis of the distal basilar artery. --Superior cerebellar arteries: Normal. --Posterior cerebral arteries: Normal. ANTERIOR CIRCULATION: --Intracranial internal carotid arteries: Normal. --Anterior cerebral arteries (ACA): Normal. --Middle cerebral arteries (MCA): Normal. ANATOMIC VARIANTS: None MRA NECK FINDINGS Vertebral arteries are codominant. They are both normal to the vertebrobasilar confluence. There is approximately 60% stenosis of the proximal right internal carotid artery. Normal left carotid system. Normal 3 vessel aortic branching pattern. IMPRESSION: 1. No acute intracranial abnormality. 2. Large, old posterior left MCA territory infarct and findings of chronic ischemic microangiopathy. 3. No emergent large vessel occlusion. 4. Moderate-to-severe stenosis of the distal basilar artery. 5. Approximately 60% stenosis of the proximal right internal carotid artery. 6. Focus of hyperintense T1-weighted signal within the pituitary gland, which may be a proteinaceous cyst. Electronically Signed   By: Ulyses Jarred M.D.   On: 02/01/2020 23:01   MR BRAIN WO CONTRAST  Result Date: 02/01/2020 CLINICAL DATA:  Sudden onset  dizziness EXAM: MR HEAD WITHOUT CONTRAST MR CIRCLE OF WILLIS WITHOUT CONTRAST MRA OF THE NECK WITHOUT AND WITH CONTRAST TECHNIQUE: Multiplanar, multiecho pulse sequences of the brain, circle of willis and surrounding structures were obtained without intravenous contrast. Angiographic images of the neck were obtained using MRA technique without and with intravenous contrast. CONTRAST:  8.31m GADAVIST GADOBUTROL 1 MMOL/ML IV SOLN COMPARISON:  None. FINDINGS: MR HEAD FINDINGS Brain: No acute infarct, acute hemorrhage or extra-axial collection. There is a large, old posterior left MCA territory infarct. Early confluent hyperintense T2-weighted signal of the periventricular and deep white matter, most commonly due to chronic ischemic microangiopathy. Generalized volume loss. No chronic microhemorrhage. There is a focus of hyperintense T1-weighted signal within the pituitary gland (series 10, image 12). Vascular: Major flow voids are preserved. Skull and upper cervical spine: Normal calvarium and skull base. Visualized upper cervical spine and soft tissues are normal. Sinuses/Orbits:No paranasal sinus fluid levels or advanced mucosal thickening. No mastoid or middle ear effusion. Normal orbits. MR CIRCLE OF WILLIS FINDINGS POSTERIOR CIRCULATION: --Vertebral arteries: Normal --Inferior cerebellar arteries: Left PICA is normal. Right PICA is not clearly visualized. --Basilar artery: Moderate-to-severe stenosis of the distal basilar artery. --Superior cerebellar arteries: Normal. --Posterior cerebral arteries: Normal. ANTERIOR CIRCULATION: --Intracranial internal carotid arteries: Normal. --Anterior cerebral arteries (ACA): Normal. --Middle cerebral arteries (MCA): Normal. ANATOMIC VARIANTS: None MRA NECK FINDINGS Vertebral arteries are codominant. They are both normal to the vertebrobasilar confluence. There is approximately 60% stenosis of the proximal right internal carotid artery. Normal left carotid system. Normal 3  vessel aortic branching pattern. IMPRESSION: 1. No acute intracranial abnormality. 2. Large, old posterior left MCA territory infarct and findings of chronic ischemic microangiopathy. 3. No emergent large vessel occlusion. 4. Moderate-to-severe stenosis of the distal basilar artery. 5. Approximately 60% stenosis of the proximal right internal carotid artery. 6. Focus of hyperintense T1-weighted signal within the pituitary gland, which may be a proteinaceous cyst. Electronically Signed   By: KUlyses JarredM.D.   On: 02/01/2020 23:01    Procedures Procedures (including critical care time)  Medications Ordered  in ED Medications  acetaminophen (TYLENOL) tablet 1,000 mg (has no administration in time range)  gadobutrol (GADAVIST) 1 MMOL/ML injection 8.5 mL (8.5 mLs Intravenous Contrast Given 02/01/20 2239)    ED Course  I have reviewed the triage vital signs and the nursing notes.  Pertinent labs & imaging results that were available during my care of the patient were reviewed by me and considered in my medical decision making (see chart for details).    MDM Rules/Calculators/A&P                          86 year old female onset of dizziness unable to ambulate here today.  Neurologic exam otherwise shows no deficits beyond baseline, history of stroke in the past.  CT imaging of the head without contrast shows no acute intracranial abnormality.  Laboratory studies thus far after my review are likely to be noncontributory.  Urine still pending.  Neurology consulted and recommend MRI MRA.  MR scan showed no acute changes however neurology has evaluated these and is not sure they can definitively say this is not central vertigo.  They will evaluate the pressure and come up with a final plan and disposition  Pt care was handed off to on coming provider at 2330.  Complete history and physical and current plan have been communicated.  Please refer to their note for the remainder of ED care and ultimate  disposition.  Pt seen in conjunction with Dr. Christy Gentles   Final Clinical Impression(s) / ED Diagnoses Final diagnoses:  Dizziness    Rx / DC Orders ED Discharge Orders    None       Breck Coons, MD 02/01/20 2314    Breck Coons, MD 02/01/20 2315

## 2020-02-01 NOTE — ED Triage Notes (Signed)
Pt here [facility] from an independent living with a sudden onset of dizziness ( room spinning) at 0800 this morning. Pt states that she was fine at 0400 when she got up to use the RR. Hx of 2 strokes. Pt takes plavix.

## 2020-02-02 ENCOUNTER — Emergency Department (HOSPITAL_COMMUNITY): Payer: Medicare Other

## 2020-02-02 ENCOUNTER — Inpatient Hospital Stay (HOSPITAL_COMMUNITY): Payer: Medicare Other

## 2020-02-02 DIAGNOSIS — I350 Nonrheumatic aortic (valve) stenosis: Secondary | ICD-10-CM | POA: Diagnosis not present

## 2020-02-02 DIAGNOSIS — G459 Transient cerebral ischemic attack, unspecified: Secondary | ICD-10-CM

## 2020-02-02 DIAGNOSIS — Z91041 Radiographic dye allergy status: Secondary | ICD-10-CM | POA: Diagnosis not present

## 2020-02-02 DIAGNOSIS — N1831 Chronic kidney disease, stage 3a: Secondary | ICD-10-CM | POA: Diagnosis present

## 2020-02-02 DIAGNOSIS — M25512 Pain in left shoulder: Secondary | ICD-10-CM | POA: Diagnosis present

## 2020-02-02 DIAGNOSIS — I679 Cerebrovascular disease, unspecified: Secondary | ICD-10-CM

## 2020-02-02 DIAGNOSIS — I6322 Cerebral infarction due to unspecified occlusion or stenosis of basilar arteries: Secondary | ICD-10-CM | POA: Diagnosis present

## 2020-02-02 DIAGNOSIS — I361 Nonrheumatic tricuspid (valve) insufficiency: Secondary | ICD-10-CM

## 2020-02-02 DIAGNOSIS — I6521 Occlusion and stenosis of right carotid artery: Secondary | ICD-10-CM | POA: Diagnosis present

## 2020-02-02 DIAGNOSIS — R200 Anesthesia of skin: Secondary | ICD-10-CM | POA: Diagnosis present

## 2020-02-02 DIAGNOSIS — H409 Unspecified glaucoma: Secondary | ICD-10-CM | POA: Diagnosis present

## 2020-02-02 DIAGNOSIS — N3289 Other specified disorders of bladder: Secondary | ICD-10-CM | POA: Diagnosis not present

## 2020-02-02 DIAGNOSIS — I63219 Cerebral infarction due to unspecified occlusion or stenosis of unspecified vertebral arteries: Secondary | ICD-10-CM | POA: Diagnosis present

## 2020-02-02 DIAGNOSIS — I651 Occlusion and stenosis of basilar artery: Secondary | ICD-10-CM | POA: Diagnosis present

## 2020-02-02 DIAGNOSIS — Z9889 Other specified postprocedural states: Secondary | ICD-10-CM | POA: Diagnosis not present

## 2020-02-02 DIAGNOSIS — Z20822 Contact with and (suspected) exposure to covid-19: Secondary | ICD-10-CM | POA: Diagnosis present

## 2020-02-02 DIAGNOSIS — Z6829 Body mass index (BMI) 29.0-29.9, adult: Secondary | ICD-10-CM | POA: Diagnosis not present

## 2020-02-02 DIAGNOSIS — R319 Hematuria, unspecified: Secondary | ICD-10-CM | POA: Diagnosis present

## 2020-02-02 DIAGNOSIS — I130 Hypertensive heart and chronic kidney disease with heart failure and stage 1 through stage 4 chronic kidney disease, or unspecified chronic kidney disease: Secondary | ICD-10-CM | POA: Diagnosis present

## 2020-02-02 DIAGNOSIS — E785 Hyperlipidemia, unspecified: Secondary | ICD-10-CM | POA: Diagnosis present

## 2020-02-02 DIAGNOSIS — N329 Bladder disorder, unspecified: Secondary | ICD-10-CM | POA: Diagnosis present

## 2020-02-02 DIAGNOSIS — D509 Iron deficiency anemia, unspecified: Secondary | ICD-10-CM | POA: Diagnosis present

## 2020-02-02 DIAGNOSIS — I2510 Atherosclerotic heart disease of native coronary artery without angina pectoris: Secondary | ICD-10-CM | POA: Diagnosis present

## 2020-02-02 DIAGNOSIS — I69398 Other sequelae of cerebral infarction: Secondary | ICD-10-CM | POA: Diagnosis not present

## 2020-02-02 DIAGNOSIS — R42 Dizziness and giddiness: Secondary | ICD-10-CM | POA: Diagnosis present

## 2020-02-02 DIAGNOSIS — I5032 Chronic diastolic (congestive) heart failure: Secondary | ICD-10-CM | POA: Diagnosis present

## 2020-02-02 DIAGNOSIS — I10 Essential (primary) hypertension: Secondary | ICD-10-CM | POA: Diagnosis not present

## 2020-02-02 DIAGNOSIS — E1122 Type 2 diabetes mellitus with diabetic chronic kidney disease: Secondary | ICD-10-CM | POA: Diagnosis present

## 2020-02-02 DIAGNOSIS — H8110 Benign paroxysmal vertigo, unspecified ear: Secondary | ICD-10-CM | POA: Diagnosis present

## 2020-02-02 DIAGNOSIS — R4701 Aphasia: Secondary | ICD-10-CM | POA: Diagnosis present

## 2020-02-02 DIAGNOSIS — E669 Obesity, unspecified: Secondary | ICD-10-CM | POA: Diagnosis present

## 2020-02-02 DIAGNOSIS — E875 Hyperkalemia: Secondary | ICD-10-CM | POA: Diagnosis present

## 2020-02-02 DIAGNOSIS — I480 Paroxysmal atrial fibrillation: Secondary | ICD-10-CM | POA: Diagnosis present

## 2020-02-02 DIAGNOSIS — E1165 Type 2 diabetes mellitus with hyperglycemia: Secondary | ICD-10-CM | POA: Diagnosis present

## 2020-02-02 LAB — LIPID PANEL
Cholesterol: 242 mg/dL — ABNORMAL HIGH (ref 0–200)
Cholesterol: 244 mg/dL — ABNORMAL HIGH (ref 0–200)
HDL: 40 mg/dL — ABNORMAL LOW (ref >40)
HDL: 41 mg/dL (ref >40)
LDL Cholesterol: 165 mg/dL — ABNORMAL HIGH (ref 0–99)
LDL Cholesterol: 154 mg/dL — ABNORMAL HIGH (ref 0–99)
Total CHOL/HDL Ratio: 6.1 RATIO
Total CHOL/HDL Ratio: 6 RATIO
Triglycerides: 185 mg/dL — ABNORMAL HIGH (ref <150)
Triglycerides: 246 mg/dL — ABNORMAL HIGH (ref <150)
VLDL: 37 mg/dL (ref 0–40)
VLDL: 49 mg/dL — ABNORMAL HIGH (ref 0–40)

## 2020-02-02 LAB — GLUCOSE, CAPILLARY
Glucose-Capillary: 143 mg/dL — ABNORMAL HIGH (ref 70–99)
Glucose-Capillary: 184 mg/dL — ABNORMAL HIGH (ref 70–99)

## 2020-02-02 LAB — CBC
HCT: 40.4 % (ref 36.0–46.0)
Hemoglobin: 12.3 g/dL (ref 12.0–15.0)
MCH: 28.2 pg (ref 26.0–34.0)
MCHC: 30.4 g/dL (ref 30.0–36.0)
MCV: 92.7 fL (ref 80.0–100.0)
Platelets: 207 10*3/uL (ref 150–400)
RBC: 4.36 MIL/uL (ref 3.87–5.11)
RDW: 12.7 % (ref 11.5–15.5)
WBC: 5.2 10*3/uL (ref 4.0–10.5)
nRBC: 0 % (ref 0.0–0.2)

## 2020-02-02 LAB — URINALYSIS, ROUTINE W REFLEX MICROSCOPIC
Bacteria, UA: NONE SEEN
Bilirubin Urine: NEGATIVE
Glucose, UA: 50 mg/dL — AB
Ketones, ur: NEGATIVE mg/dL
Leukocytes,Ua: NEGATIVE
Nitrite: NEGATIVE
Protein, ur: NEGATIVE mg/dL
Specific Gravity, Urine: 1.012 (ref 1.005–1.030)
pH: 7 (ref 5.0–8.0)

## 2020-02-02 LAB — ECHOCARDIOGRAM COMPLETE BUBBLE STUDY
AR max vel: 1.26 cm2
AV Area VTI: 1.35 cm2
AV Area mean vel: 1.32 cm2
AV Mean grad: 12.2 mmHg
AV Peak grad: 23.5 mmHg
Ao pk vel: 2.43 m/s
Area-P 1/2: 2.37 cm2
Calc EF: 64.3 %
S' Lateral: 1.6 cm
Single Plane A2C EF: 69 %
Single Plane A4C EF: 54.7 %

## 2020-02-02 LAB — RENAL FUNCTION PANEL
Albumin: 3.3 g/dL — ABNORMAL LOW (ref 3.5–5.0)
Anion gap: 14 (ref 5–15)
BUN: 17 mg/dL (ref 8–23)
CO2: 23 mmol/L (ref 22–32)
Calcium: 9.5 mg/dL (ref 8.9–10.3)
Chloride: 105 mmol/L (ref 98–111)
Creatinine, Ser: 1.04 mg/dL — ABNORMAL HIGH (ref 0.44–1.00)
GFR, Estimated: 52 mL/min — ABNORMAL LOW (ref >60)
Glucose, Bld: 125 mg/dL — ABNORMAL HIGH (ref 70–99)
Phosphorus: 2.8 mg/dL (ref 2.5–4.6)
Potassium: 4.6 mmol/L (ref 3.5–5.1)
Sodium: 142 mmol/L (ref 135–145)

## 2020-02-02 LAB — TSH: TSH: 0.951 u[IU]/mL (ref 0.350–4.500)

## 2020-02-02 LAB — RESP PANEL BY RT-PCR (FLU A&B, COVID) ARPGX2
Influenza A by PCR: NEGATIVE
Influenza B by PCR: NEGATIVE
SARS Coronavirus 2 by RT PCR: NEGATIVE

## 2020-02-02 LAB — CBG MONITORING, ED
Glucose-Capillary: 120 mg/dL — ABNORMAL HIGH (ref 70–99)
Glucose-Capillary: 245 mg/dL — ABNORMAL HIGH (ref 70–99)

## 2020-02-02 LAB — VITAMIN B12: Vitamin B-12: 248 pg/mL (ref 180–914)

## 2020-02-02 MED ORDER — CLOPIDOGREL BISULFATE 75 MG PO TABS
75.0000 mg | ORAL_TABLET | Freq: Every day | ORAL | Status: DC
  Administered 2020-02-02 – 2020-02-05 (×4): 75 mg via ORAL
  Filled 2020-02-02 (×4): qty 1

## 2020-02-02 MED ORDER — AMLODIPINE BESYLATE-VALSARTAN 5-320 MG PO TABS: 1.0000 | ORAL_TABLET | Freq: Every day | ORAL | Status: DC

## 2020-02-02 MED ORDER — LACTATED RINGERS IV SOLN: INTRAVENOUS | Status: AC

## 2020-02-02 MED ORDER — HYDRALAZINE HCL 20 MG/ML IJ SOLN: 5.0000 mg | Freq: Four times a day (QID) | INTRAMUSCULAR | Status: DC | PRN

## 2020-02-02 MED ORDER — SODIUM CHLORIDE 0.9% FLUSH
10.0000 mL | Freq: Once | INTRAVENOUS | Status: AC
  Administered 2020-02-02: 10 mL via INTRAVENOUS

## 2020-02-02 MED ORDER — IRBESARTAN 300 MG PO TABS
300.0000 mg | ORAL_TABLET | Freq: Every day | ORAL | Status: DC
  Administered 2020-02-02 – 2020-02-05 (×4): 300 mg via ORAL
  Filled 2020-02-02 (×5): qty 1

## 2020-02-02 MED ORDER — INSULIN ASPART 100 UNIT/ML ~~LOC~~ SOLN
0.0000 [IU] | Freq: Three times a day (TID) | SUBCUTANEOUS | Status: DC
  Administered 2020-02-02: 1 [IU] via SUBCUTANEOUS
  Administered 2020-02-02: 3 [IU] via SUBCUTANEOUS
  Administered 2020-02-03 (×2): 2 [IU] via SUBCUTANEOUS
  Administered 2020-02-03: 1 [IU] via SUBCUTANEOUS
  Administered 2020-02-04 (×2): 7 [IU] via SUBCUTANEOUS
  Administered 2020-02-04: 5 [IU] via SUBCUTANEOUS
  Administered 2020-02-05 (×2): 7 [IU] via SUBCUTANEOUS
  Administered 2020-02-05: 9 [IU] via SUBCUTANEOUS

## 2020-02-02 MED ORDER — LIDOCAINE 5 % EX PTCH
1.0000 | MEDICATED_PATCH | Freq: Every day | CUTANEOUS | Status: DC
  Administered 2020-02-02 – 2020-02-05 (×4): 1 via TRANSDERMAL
  Filled 2020-02-02 (×6): qty 1

## 2020-02-02 MED ORDER — INSULIN ASPART 100 UNIT/ML ~~LOC~~ SOLN
0.0000 [IU] | Freq: Every day | SUBCUTANEOUS | Status: DC
  Administered 2020-02-03: 2 [IU] via SUBCUTANEOUS
  Administered 2020-02-04: 4 [IU] via SUBCUTANEOUS

## 2020-02-02 MED ORDER — PANTOPRAZOLE SODIUM 40 MG PO TBEC
40.0000 mg | DELAYED_RELEASE_TABLET | Freq: Every day | ORAL | Status: DC
  Administered 2020-02-02 – 2020-02-05 (×4): 40 mg via ORAL
  Filled 2020-02-02 (×4): qty 1

## 2020-02-02 MED ORDER — AMLODIPINE BESYLATE 5 MG PO TABS
5.0000 mg | ORAL_TABLET | Freq: Every day | ORAL | Status: DC
  Administered 2020-02-02 – 2020-02-05 (×4): 5 mg via ORAL
  Filled 2020-02-02 (×4): qty 1

## 2020-02-02 MED ORDER — ATORVASTATIN CALCIUM 40 MG PO TABS
40.0000 mg | ORAL_TABLET | Freq: Every day | ORAL | Status: DC
  Administered 2020-02-02: 40 mg via ORAL
  Filled 2020-02-02: qty 4

## 2020-02-02 MED ORDER — PERFLUTREN LIPID MICROSPHERE
1.0000 mL | INTRAVENOUS | Status: AC | PRN
  Administered 2020-02-02: 2 mL via INTRAVENOUS
  Filled 2020-02-02: qty 10

## 2020-02-02 MED ORDER — ACETAMINOPHEN 325 MG PO TABS: 650.0000 mg | ORAL_TABLET | Freq: Four times a day (QID) | ORAL | Status: DC | PRN

## 2020-02-02 MED ORDER — VITAMIN D (ERGOCALCIFEROL) 1.25 MG (50000 UNIT) PO CAPS
50000.0000 [IU] | ORAL_CAPSULE | ORAL | Status: DC
  Administered 2020-02-04: 50000 [IU] via ORAL
  Filled 2020-02-02: qty 1

## 2020-02-02 MED ORDER — ENOXAPARIN SODIUM 40 MG/0.4ML ~~LOC~~ SOLN
40.0000 mg | SUBCUTANEOUS | Status: DC
  Administered 2020-02-02 – 2020-02-05 (×4): 40 mg via SUBCUTANEOUS
  Filled 2020-02-02 (×4): qty 0.4

## 2020-02-02 MED ORDER — FERROUS GLUCONATE 324 (38 FE) MG PO TABS
324.0000 mg | ORAL_TABLET | Freq: Every day | ORAL | Status: DC
  Administered 2020-02-03 – 2020-02-05 (×3): 324 mg via ORAL
  Filled 2020-02-02 (×5): qty 1

## 2020-02-02 MED ORDER — ATORVASTATIN CALCIUM 80 MG PO TABS
80.0000 mg | ORAL_TABLET | Freq: Every day | ORAL | Status: DC
  Administered 2020-02-03 – 2020-02-05 (×3): 80 mg via ORAL
  Filled 2020-02-02 (×3): qty 1

## 2020-02-02 NOTE — Progress Notes (Signed)
STROKE TEAM PROGRESS NOTE   INTERVAL HISTORY No family at the bedside. Pt lying in bed, stated that she had sudden onset vertigo and almost fell. Now she felt better with still has dizziness with head motion. MRI no acute infarct. MRA head concerning for BA moderate stenosis but MRA neck by my view the BA showed no stenosis. I discussed with pt regarding further work up with CTA head and neck or going to cerebral aneurysm. However, pt does have contrast allergy with Hives. She has difficulty to make decision by herself, I called her sister Burman Nieves to help her but she was not available. Pt would like to have family meeting including 2 of her sisters and 2 of her nephew and niece to help her make decisions. Will contact case manager to arrange.   Vitals:   02/02/20 1000 02/02/20 1030 02/02/20 1147 02/02/20 1230  BP: 135/63 139/82 (!) 117/54 115/79  Pulse: 63 70 64 66  Resp: 18 10 18 12   Temp:      TempSrc:      SpO2: 95% 99% 100% 100%   CBC:  Recent Labs  Lab 02/01/20 1643 02/02/20 0720  WBC 5.5 5.2  NEUTROABS 2.1  --   HGB 12.2 12.3  HCT 40.3 40.4  MCV 91.8 92.7  PLT 227 301   Basic Metabolic Panel:  Recent Labs  Lab 02/01/20 1643 02/02/20 0720  NA 141 142  K 5.2* 4.6  CL 104 105  CO2 23 23  GLUCOSE 158* 125*  BUN 22 17  CREATININE 1.18* 1.04*  CALCIUM 9.7 9.5  MG 2.3  --   PHOS  --  2.8   Lipid Panel:  Recent Labs  Lab 02/02/20 0720  CHOL 242*  TRIG 185*  HDL 40*  CHOLHDL 6.1  VLDL 37  LDLCALC 165*   HgbA1c: No results for input(s): HGBA1C in the last 168 hours. Urine Drug Screen: No results for input(s): LABOPIA, COCAINSCRNUR, LABBENZ, AMPHETMU, THCU, LABBARB in the last 168 hours.  Alcohol Level No results for input(s): ETH in the last 168 hours.  IMAGING past 24 hours CT Head Wo Contrast  Result Date: 02/01/2020 CLINICAL DATA:  Dizziness, sudden onset beginning this morning. History of strokes. Patient takes Plavix. EXAM: CT HEAD WITHOUT CONTRAST  TECHNIQUE: Contiguous axial images were obtained from the base of the skull through the vertex without intravenous contrast. COMPARISON:  11/23/2004 FINDINGS: Brain: No evidence of acute infarction, hemorrhage, hydrocephalus, extra-axial collection or mass lesion/mass effect. Old left posterior MCA distribution infarct, stable. Vascular: No hyperdense vessel or unexpected calcification. Skull: Normal. Negative for fracture or focal lesion. Sinuses/Orbits: Globes and orbits are unremarkable. Sinuses and mastoid air cells are clear. Other: None. IMPRESSION: 1. No acute intracranial abnormalities. 2. Old left posterior MCA distribution infarct. Electronically Signed   By: Lajean Manes M.D.   On: 02/01/2020 17:36   MR ANGIO HEAD WO CONTRAST  Result Date: 02/01/2020 CLINICAL DATA:  Sudden onset dizziness EXAM: MR HEAD WITHOUT CONTRAST MR CIRCLE OF WILLIS WITHOUT CONTRAST MRA OF THE NECK WITHOUT AND WITH CONTRAST TECHNIQUE: Multiplanar, multiecho pulse sequences of the brain, circle of willis and surrounding structures were obtained without intravenous contrast. Angiographic images of the neck were obtained using MRA technique without and with intravenous contrast. CONTRAST:  8.83mL GADAVIST GADOBUTROL 1 MMOL/ML IV SOLN COMPARISON:  None. FINDINGS: MR HEAD FINDINGS Brain: No acute infarct, acute hemorrhage or extra-axial collection. There is a large, old posterior left MCA territory infarct. Early confluent hyperintense T2-weighted signal  of the periventricular and deep white matter, most commonly due to chronic ischemic microangiopathy. Generalized volume loss. No chronic microhemorrhage. There is a focus of hyperintense T1-weighted signal within the pituitary gland (series 10, image 12). Vascular: Major flow voids are preserved. Skull and upper cervical spine: Normal calvarium and skull base. Visualized upper cervical spine and soft tissues are normal. Sinuses/Orbits:No paranasal sinus fluid levels or advanced  mucosal thickening. No mastoid or middle ear effusion. Normal orbits. MR CIRCLE OF WILLIS FINDINGS POSTERIOR CIRCULATION: --Vertebral arteries: Normal --Inferior cerebellar arteries: Left PICA is normal. Right PICA is not clearly visualized. --Basilar artery: Moderate-to-severe stenosis of the distal basilar artery. --Superior cerebellar arteries: Normal. --Posterior cerebral arteries: Normal. ANTERIOR CIRCULATION: --Intracranial internal carotid arteries: Normal. --Anterior cerebral arteries (ACA): Normal. --Middle cerebral arteries (MCA): Normal. ANATOMIC VARIANTS: None MRA NECK FINDINGS Vertebral arteries are codominant. They are both normal to the vertebrobasilar confluence. There is approximately 60% stenosis of the proximal right internal carotid artery. Normal left carotid system. Normal 3 vessel aortic branching pattern. IMPRESSION: 1. No acute intracranial abnormality. 2. Large, old posterior left MCA territory infarct and findings of chronic ischemic microangiopathy. 3. No emergent large vessel occlusion. 4. Moderate-to-severe stenosis of the distal basilar artery. 5. Approximately 60% stenosis of the proximal right internal carotid artery. 6. Focus of hyperintense T1-weighted signal within the pituitary gland, which may be a proteinaceous cyst. Electronically Signed   By: Ulyses Jarred M.D.   On: 02/01/2020 23:01   MR ANGIO NECK W WO CONTRAST  Result Date: 02/01/2020 CLINICAL DATA:  Sudden onset dizziness EXAM: MR HEAD WITHOUT CONTRAST MR CIRCLE OF WILLIS WITHOUT CONTRAST MRA OF THE NECK WITHOUT AND WITH CONTRAST TECHNIQUE: Multiplanar, multiecho pulse sequences of the brain, circle of willis and surrounding structures were obtained without intravenous contrast. Angiographic images of the neck were obtained using MRA technique without and with intravenous contrast. CONTRAST:  8.63mL GADAVIST GADOBUTROL 1 MMOL/ML IV SOLN COMPARISON:  None. FINDINGS: MR HEAD FINDINGS Brain: No acute infarct, acute  hemorrhage or extra-axial collection. There is a large, old posterior left MCA territory infarct. Early confluent hyperintense T2-weighted signal of the periventricular and deep white matter, most commonly due to chronic ischemic microangiopathy. Generalized volume loss. No chronic microhemorrhage. There is a focus of hyperintense T1-weighted signal within the pituitary gland (series 10, image 12). Vascular: Major flow voids are preserved. Skull and upper cervical spine: Normal calvarium and skull base. Visualized upper cervical spine and soft tissues are normal. Sinuses/Orbits:No paranasal sinus fluid levels or advanced mucosal thickening. No mastoid or middle ear effusion. Normal orbits. MR CIRCLE OF WILLIS FINDINGS POSTERIOR CIRCULATION: --Vertebral arteries: Normal --Inferior cerebellar arteries: Left PICA is normal. Right PICA is not clearly visualized. --Basilar artery: Moderate-to-severe stenosis of the distal basilar artery. --Superior cerebellar arteries: Normal. --Posterior cerebral arteries: Normal. ANTERIOR CIRCULATION: --Intracranial internal carotid arteries: Normal. --Anterior cerebral arteries (ACA): Normal. --Middle cerebral arteries (MCA): Normal. ANATOMIC VARIANTS: None MRA NECK FINDINGS Vertebral arteries are codominant. They are both normal to the vertebrobasilar confluence. There is approximately 60% stenosis of the proximal right internal carotid artery. Normal left carotid system. Normal 3 vessel aortic branching pattern. IMPRESSION: 1. No acute intracranial abnormality. 2. Large, old posterior left MCA territory infarct and findings of chronic ischemic microangiopathy. 3. No emergent large vessel occlusion. 4. Moderate-to-severe stenosis of the distal basilar artery. 5. Approximately 60% stenosis of the proximal right internal carotid artery. 6. Focus of hyperintense T1-weighted signal within the pituitary gland, which may be a proteinaceous cyst. Electronically  Signed   By: Ulyses Jarred  M.D.   On: 02/01/2020 23:01   MR BRAIN WO CONTRAST  Result Date: 02/01/2020 CLINICAL DATA:  Sudden onset dizziness EXAM: MR HEAD WITHOUT CONTRAST MR CIRCLE OF WILLIS WITHOUT CONTRAST MRA OF THE NECK WITHOUT AND WITH CONTRAST TECHNIQUE: Multiplanar, multiecho pulse sequences of the brain, circle of willis and surrounding structures were obtained without intravenous contrast. Angiographic images of the neck were obtained using MRA technique without and with intravenous contrast. CONTRAST:  8.46mL GADAVIST GADOBUTROL 1 MMOL/ML IV SOLN COMPARISON:  None. FINDINGS: MR HEAD FINDINGS Brain: No acute infarct, acute hemorrhage or extra-axial collection. There is a large, old posterior left MCA territory infarct. Early confluent hyperintense T2-weighted signal of the periventricular and deep white matter, most commonly due to chronic ischemic microangiopathy. Generalized volume loss. No chronic microhemorrhage. There is a focus of hyperintense T1-weighted signal within the pituitary gland (series 10, image 12). Vascular: Major flow voids are preserved. Skull and upper cervical spine: Normal calvarium and skull base. Visualized upper cervical spine and soft tissues are normal. Sinuses/Orbits:No paranasal sinus fluid levels or advanced mucosal thickening. No mastoid or middle ear effusion. Normal orbits. MR CIRCLE OF WILLIS FINDINGS POSTERIOR CIRCULATION: --Vertebral arteries: Normal --Inferior cerebellar arteries: Left PICA is normal. Right PICA is not clearly visualized. --Basilar artery: Moderate-to-severe stenosis of the distal basilar artery. --Superior cerebellar arteries: Normal. --Posterior cerebral arteries: Normal. ANTERIOR CIRCULATION: --Intracranial internal carotid arteries: Normal. --Anterior cerebral arteries (ACA): Normal. --Middle cerebral arteries (MCA): Normal. ANATOMIC VARIANTS: None MRA NECK FINDINGS Vertebral arteries are codominant. They are both normal to the vertebrobasilar confluence. There is  approximately 60% stenosis of the proximal right internal carotid artery. Normal left carotid system. Normal 3 vessel aortic branching pattern. IMPRESSION: 1. No acute intracranial abnormality. 2. Large, old posterior left MCA territory infarct and findings of chronic ischemic microangiopathy. 3. No emergent large vessel occlusion. 4. Moderate-to-severe stenosis of the distal basilar artery. 5. Approximately 60% stenosis of the proximal right internal carotid artery. 6. Focus of hyperintense T1-weighted signal within the pituitary gland, which may be a proteinaceous cyst. Electronically Signed   By: Ulyses Jarred M.D.   On: 02/01/2020 23:01   DG Shoulder Left  Result Date: 02/02/2020 CLINICAL DATA:  Left shoulder pain EXAM: LEFT SHOULDER - 2+ VIEW COMPARISON:  None. FINDINGS: Degenerative changes of the acromioclavicular and glenohumeral joints are seen. No fracture or dislocation is noted. No soft tissue abnormality is seen. Underlying bony thorax is within normal limits. IMPRESSION: Degenerative change without acute abnormality. Electronically Signed   By: Inez Catalina M.D.   On: 02/02/2020 01:56   ECHOCARDIOGRAM COMPLETE BUBBLE STUDY  Result Date: 02/02/2020    ECHOCARDIOGRAM REPORT   Patient Name:   ROSEALIE REACH Date of Exam: 02/02/2020 Medical Rec #:  [id number]     Height:       67.0 in Accession #:    [id number]    Weight:       189.0 lb Date of Birth:  30-Dec-1930    BSA:          1.974 m Patient Age:    86 years      BP:           140/62 mmHg Patient Gender: F             HR:           61 bpm. Exam Location:  Inpatient Procedure: 2D Echo, Color Doppler, Cardiac Doppler, Saline Contrast Bubble Study  and Intracardiac Opacification Agent Indications:    TIA (transient ischemic attack) 435.9 / G45.9  History:        Patient has no prior history of Echocardiogram examinations.                 CHF, CAD, Stroke; Risk Factors:Diabetes, Dyslipidemia and                 Hypertension. Sudden onset  dizziness, associated with difficulty                 ambulating around. Chronic kidney disease.  Sonographer:    Darlina Sicilian RDCS Referring Phys: [id number] [place]  1. Technically difficult; suggest TEE if clinically indicated; negative saline microcavitation study.  2. Left ventricular ejection fraction, by estimation, is >75%. The left ventricle has hyperdynamic function. The left ventricle has no regional wall motion abnormalities. There is mild left ventricular hypertrophy of the basal-septal segment. Left ventricular diastolic parameters are consistent with Grade I diastolic dysfunction (impaired relaxation).  3. Right ventricular systolic function is normal. The right ventricular size is normal. Tricuspid regurgitation signal is inadequate for assessing PA pressure.  4. The mitral valve is normal in structure. No evidence of mitral valve regurgitation. No evidence of mitral stenosis.  5. The aortic valve has an indeterminant number of cusps. Aortic valve regurgitation is not visualized. Mild to moderate aortic valve stenosis.  6. The inferior vena cava is normal in size with greater than 50% respiratory variability, suggesting right atrial pressure of 3 mmHg. FINDINGS  Left Ventricle: Left ventricular ejection fraction, by estimation, is >75%. The left ventricle has hyperdynamic function. The left ventricle has no regional wall motion abnormalities. Definity contrast agent was given IV to delineate the left ventricular endocardial borders. The left ventricular internal cavity size was normal in size. There is mild left ventricular hypertrophy of the basal-septal segment. Left ventricular diastolic parameters are consistent with Grade I diastolic dysfunction  (impaired relaxation). Right Ventricle: The right ventricular size is normal. No increase in right ventricular wall thickness. Right ventricular systolic function is normal. Tricuspid regurgitation signal is inadequate for assessing PA  pressure. The tricuspid regurgitant velocity is 2.14 m/s, and with an assumed right atrial pressure of 8 mmHg, the estimated right ventricular systolic pressure is 28.4 mmHg. Left Atrium: Left atrial size was normal in size. Right Atrium: Right atrial size was normal in size. Pericardium: There is no evidence of pericardial effusion. Mitral Valve: The mitral valve is normal in structure. Mild mitral annular calcification. No evidence of mitral valve regurgitation. No evidence of mitral valve stenosis. Tricuspid Valve: The tricuspid valve is normal in structure. Tricuspid valve regurgitation is mild . No evidence of tricuspid stenosis. Aortic Valve: The aortic valve has an indeterminant number of cusps. Aortic valve regurgitation is not visualized. Mild to moderate aortic stenosis is present. Aortic valve mean gradient measures 12.2 mmHg. Aortic valve peak gradient measures 23.5 mmHg. Aortic valve area, by VTI measures 1.35 cm. Pulmonic Valve: The pulmonic valve was not well visualized. Pulmonic valve regurgitation is not visualized. No evidence of pulmonic stenosis. Aorta: The aortic root is normal in size and structure. Venous: The inferior vena cava is normal in size with greater than 50% respiratory variability, suggesting right atrial pressure of 3 mmHg. IAS/Shunts: Agitated saline contrast was given intravenously to evaluate for intracardiac shunting. Additional Comments: Technically difficult; suggest TEE if clinically indicated; negative saline microcavitation study.  LEFT VENTRICLE PLAX 2D LVIDd:  4.10 cm     Diastology LVIDs:         1.60 cm     LV e' medial:    7.07 cm/s LV PW:         0.90 cm     LV E/e' medial:  9.9 LV IVS:        0.90 cm     LV e' lateral:   7.94 cm/s LVOT diam:     1.90 cm     LV E/e' lateral: 8.8 LV SV:         70 LV SV Index:   35 LVOT Area:     2.84 cm  LV Volumes (MOD) LV vol d, MOD A2C: 96.6 ml LV vol d, MOD A4C: 58.5 ml LV vol s, MOD A2C: 29.9 ml LV vol s, MOD A4C: 26.5  ml LV SV MOD A2C:     66.7 ml LV SV MOD A4C:     58.5 ml LV SV MOD BP:      50.8 ml RIGHT VENTRICLE RV S prime:     10.90 cm/s TAPSE (M-mode): 2.5 cm LEFT ATRIUM             Index LA diam:        3.20 cm 1.62 cm/m LA Vol (A2C):   57.2 ml 28.97 ml/m LA Vol (A4C):   24.4 ml 12.36 ml/m LA Biplane Vol: 38.0 ml 19.25 ml/m  AORTIC VALVE AV Area (Vmax):    1.26 cm AV Area (Vmean):   1.32 cm AV Area (VTI):     1.35 cm AV Vmax:           242.60 cm/s AV Vmean:          160.800 cm/s AV VTI:            0.520 m AV Peak Grad:      23.5 mmHg AV Mean Grad:      12.2 mmHg LVOT Vmax:         107.50 cm/s LVOT Vmean:        74.700 cm/s LVOT VTI:          0.247 m LVOT/AV VTI ratio: 0.48  AORTA Ao Root diam: 3.00 cm Ao Asc diam:  3.50 cm MITRAL VALVE                TRICUSPID VALVE MV Area (PHT): 2.37 cm     TR Peak grad:   18.3 mmHg MV Decel Time: 320 msec     TR Vmax:        214.00 cm/s MV E velocity: 70.00 cm/s MV A velocity: 103.00 cm/s  SHUNTS MV E/A ratio:  0.68         Systemic VTI:  0.25 m                             Systemic Diam: 1.90 cm Kirk Ruths MD Electronically signed by Kirk Ruths MD Signature Date/Time: 02/02/2020/1:48:06 PM    Final     PHYSICAL EXAM  Temp:  [98.1 F (36.7 C)-99.2 F (37.3 C)] 99.2 F (37.3 C) (12/02 1802) Pulse Rate:  [58-70] 65 (12/02 1802) Resp:  [10-22] 16 (12/02 1802) BP: (108-198)/(54-88) 136/60 (12/02 1802) SpO2:  [95 %-100 %] 99 % (12/02 1802)  General - Well nourished, well developed, in no apparent distress.  Ophthalmologic - fundi not visualized due to noncooperation.  Cardiovascular - Regular rhythm and rate, not in  afib.  Mental Status -  Level of arousal and orientation to time, place, and person were intact. Language including expression, naming, repetition, comprehension was assessed and found intact. Mild to moderate stuttering speech Fund of Knowledge was assessed and was intact.  Cranial Nerves II - XII - II - Visual field intact OU. III, IV,  VI - Extraocular movements intact. V - Chronic right facial light touch decrease. VII - Facial movement intact bilaterally. VIII - Hearing & vestibular intact bilaterally. X - Palate elevates symmetrically. XI - Chin turning & shoulder shrug intact bilaterally. XII - Tongue protrusion intact.  Motor Strength - The patient's strength was symmetrical in all extremities, BUE 5/5 and BLE 3/5 and pronator drift was absent.  Bulk was normal and fasciculations were absent.   Motor Tone - Muscle tone was assessed at the neck and appendages and was normal.  Reflexes - The patient's reflexes were symmetrical in all extremities and she had no pathological reflexes.  Sensory - Light touch, temperature/pinprick were assessed and were chronically decreased on the right.    Coordination - The patient had normal movements in the hands with no ataxia or dysmetria.  Tremor was absent.  Gait and Station - deferred.   ASSESSMENT/PLAN Ms. JANAL HAAK is a 86 y.o. female with history of reported atrial fibrillation not on anticoagulation (previously on warfarin), type 2 diabetes, hypertension, hyperlipidemia, obesity,coronary artery disease, congestive heart failure, aortic stenosis, chronic kidney disease, former tobacco use, and prior stroke (posterior R MCA territory with residual hemianopia, mild right sided weakness and mild aphasia) with bladder bx 01/24/2020 off plavix 5 days prior presenting with dizziness.   TIA w/ vertigo in setting of possible BA stenosis    CT head No acute abnormality. Old L posterior MCA infarct.   MRI  No acute abnormality. Old large L MCA infarct.   MRA head & neck no ELVO, moderate to severe distal BA stenosis, proximal R ICA 60% stenosis. Hyperintense pituitary on T1 (?proteinaceous cyst).  2D Echo EF >75%. No source of embolus   LDL 165  HgbA1c 7.8  VTE prophylaxis - Lovenox 40 mg sq daily   clopidogrel 75 mg daily prior to admission (off 5 days prior to bladder  bx on 01/24/2020), now on plavix 75mg  daily.   Therapy recommendations:  pending   Disposition:  pending   BA stenosis  MRA head & neck no ELVO, moderate to severe distal BA stenosis, proximal R ICA 60% stenosis. Hyperintense pituitary on T1 (?proteinaceous cyst). On my review, BA stenosis on MRA head but not on the MRA neck.   Consider CTA head and neck with allergy preparation protocol vs. cerebral angio to evaluate BA stenosis  - however pt would like to have family meeting to discuss before making decision  Atrial Fibrillation  Home anticoagulation:  none - previously on warfarin  No clear documentation of afib that I can find - Dr. Terrence Dupont is her cardiology will reach out to him.  Recent hematuria due to bladder mass - hold off plavix - not good candidate for [facility] at this time  Now on plaivx   Carotid stenosis  MRA neck showed right ICA 60% stenosis - asymptomatic  S/p left CEA in 2003 with Dr. Donnetta Hutching - had a preoperative neurologic event as manifested by speech difficulties left hemiparesis, and had good return of neurologic function.  CUS 11/2015 40-59% stenosis involving the right ICA based on diastolic velocity. However, based on systolic velocity and ratio, stenosis is  in 60-79% range.   CUS 01/2017 - right ICA 1-39% stenosis  Followed with Dr. Donnetta Hutching at [facility]  Need continue to follow up with dr. Donnetta Hutching at [facility]  Hypertension  High on arrival, now Stable . Avoid low BP . Long-term BP goal 130-150 given BA stenosis  Hyperlipidemia  Home meds:  lipitor 40, resumed in hospital  LDL 165, goal < 70  Increase lipitor to 80  Continue statin at discharge  Diabetes type II Uncontrolled  HgbA1c 7.8, goal < 7.0  CBGs  SSI  Close PCP follow up for better DM control  Other Stroke Risk Factors  Advanced Age >/= 5   Former Cigarette smoker  Obesity  Coronary artery disease  Congestive heart failure  Aortic stenosis   Other Active Problems  CKD IIIa  Cre 1.18  Hyperkalemia 5.2, EKG ok. Stopped oral replacement. Repeat  Fe deficiency anemia on oral supplement  GERD  Generalized weakness  Hospital day # 0  I spent  35 minutes in total face-to-face time with the patient, more than 50% of which was spent in counseling and coordination of care, reviewing test results, images and medication, and discussing the diagnosis, treatment plan and potential prognosis. This patient's care requiresreview of multiple databases, neurological assessment, discussion with family, other specialists and medical decision making of high complexity. I discussed with Dr. Benny Lennert.   Rosalin Hawking, MD PhD Stroke Neurology 02/02/2020 7:04 PM  To contact Stroke Continuity provider, please refer to [email or web address] After hours, contact General Neurology

## 2020-02-02 NOTE — Evaluation (Signed)
Clinical/Bedside Swallow Evaluation Patient Details  Name: WILLOWDEAN LUHMANN MRN: [id number] Date of Birth: [birth year]  Today's Date: 02/02/2020 Time: SLP Start Time (ACUTE ONLY): 1340 SLP Stop Time (ACUTE ONLY): 1348 SLP Time Calculation (min) (ACUTE ONLY): 8 min  Past Medical History:  Past Medical History:  Diagnosis Date  . Anemia    mild  . Anginal pain ([place])   . Aortic stenosis   . Atrial fibrillation ([place])   . Bowel obstruction ([place]) 2015   PSBO  . Bowel obstruction ([place]) 10/2017  . CAD (coronary artery disease)   . Carotid artery stenosis   . CHF (congestive heart failure) ([place])   . Chronic kidney disease    renal insufficiency - DR Florene Glen yearly  . Diabetes mellitus without complication ([place])    type 2  . Full dentures   . History of hiatal hernia   . Hypertension   . Mobility impaired    uses walker for ambulation  . Stroke [facility])    right sided weakness,walker  . Wears glasses    Past Surgical History:  Past Surgical History:  Procedure Laterality Date  . ABDOMINAL HYSTERECTOMY    . angiodysplasia    . APPENDECTOMY    . BREAST DUCTAL SYSTEM EXCISION  03/02/2012   Procedure: EXCISION DUCTAL SYSTEM BREAST;  Surgeon: Adin Hector, MD;  Location: [facility];  Service: General;  Laterality: Left;  . BREAST EXCISIONAL BIOPSY    . BREAST SURGERY     bx rt and lt   . CATARACT EXTRACTION     left  . CHOLECYSTECTOMY    . COLONOSCOPY N/A 10/15/2012   Procedure: COLONOSCOPY;  Surgeon: Beryle Beams, MD;  Location: [facility];  Service: Endoscopy;  Laterality: N/A;  . CYSTOSCOPY WITH BIOPSY N/A 01/24/2020   Procedure: CYSTOSCOPY WITH BLADDER BIOPSY AND FULGERATION;  Surgeon: Robley Fries, MD;  Location: [facility];  Service: Urology;  Laterality: N/A;  . DILATION AND CURETTAGE OF UTERUS    . DVT    . ENTEROSCOPY N/A 11/17/2014   Procedure: ENTEROSCOPY;  Surgeon: Carol Ada, MD;  Location: [facility];  Service: Endoscopy;  Laterality: N/A;  .  ESOPHAGOGASTRODUODENOSCOPY N/A 09/10/2012   Procedure: ESOPHAGOGASTRODUODENOSCOPY (EGD);  Surgeon: Beryle Beams, MD;  Location: [facility];  Service: Endoscopy;  Laterality: N/A;  . EYE SURGERY     right-glaucoma  . HOT HEMOSTASIS N/A 09/10/2012   Procedure: HOT HEMOSTASIS (ARGON PLASMA COAGULATION/BICAP);  Surgeon: Beryle Beams, MD;  Location: [facility];  Service: Endoscopy;  Laterality: N/A;  . HOT HEMOSTASIS N/A 11/17/2014   Procedure: HOT HEMOSTASIS (ARGON PLASMA COAGULATION/BICAP);  Surgeon: Carol Ada, MD;  Location: [facility];  Service: Endoscopy;  Laterality: N/A;  . TUBAL LIGATION     HPI:  KALLEIGH HARBOR is a 86 y.o. female with medical history significant for prior CVA, essential hypertension, type 2 diabetes, hyperlipidemia, coronary artery disease, chronic diastolic CHF, paroxysmal atrial fibrillation not on oral anticoagulation who presented to [facility] ED due to sudden onset dizziness, associated with difficulty ambulating around 8 AM on 02/01/2020 when she tried to get out of bed.  MRI brain done on 02/01/2020 showed no acute intracranial abnormality, large old posterior left MCA territory infarct and findings of chronic ischemic microangiopathy. Pt reports a history of dysphagia with prior CVA which has resolved. She also confirms mild residual asphasia.    Assessment / Plan / Recommendation Clinical Impression  Pt demonstrates adequate swallowing ability; she does have a remote  history of dysphagia after prior CVA that has resolved. Pt now able to masticate most solids despite missing dentition. Pt may continue a regular diet and thin liquids. Will sign off.  SLP Visit Diagnosis: Dysphagia, unspecified (R13.10)    Aspiration Risk  Mild aspiration risk    Diet Recommendation Regular;Thin liquid   Liquid Administration via: Cup;Straw Medication Administration: Whole meds with liquid Supervision: Patient able to self feed Postural Changes: Seated upright at 90 degrees     Other  Recommendations     Follow up Recommendations None      Frequency and Duration            Prognosis        Swallow Study   General HPI: IYSHA MISHKIN is a 86 y.o. female with medical history significant for prior CVA, essential hypertension, type 2 diabetes, hyperlipidemia, coronary artery disease, chronic diastolic CHF, paroxysmal atrial fibrillation not on oral anticoagulation who presented to [facility] ED due to sudden onset dizziness, associated with difficulty ambulating around 8 AM on 02/01/2020 when she tried to get out of bed.  MRI brain done on 02/01/2020 showed no acute intracranial abnormality, large old posterior left MCA territory infarct and findings of chronic ischemic microangiopathy. Pt reports a history of dysphagia with prior CVA which has resolved. She also confirms mild residual asphasia.  Type of Study: Bedside Swallow Evaluation Diet Prior to this Study: Regular;Thin liquids Temperature Spikes Noted: No Behavior/Cognition: Alert;Cooperative Oral Cavity Assessment: Within Functional Limits Oral Cavity - Dentition: Missing dentition;Dentures, not available Self-Feeding Abilities: Able to feed self Patient Positioning: Upright in bed Volitional Cough: Strong Volitional Swallow: Able to elicit    Oral/Motor/Sensory Function Overall Oral Motor/Sensory Function: Within functional limits   Ice Chips     Thin Liquid Thin Liquid: Within functional limits Presentation: Cup;Straw;Self Fed    Nectar Thick Nectar Thick Liquid: Not tested   Honey Thick Honey Thick Liquid: Not tested   Puree Puree: Within functional limits   Solid     Solid: Within functional limits      Zoey Gilkeson, Katherene Ponto 02/02/2020,2:12 PM

## 2020-02-02 NOTE — ED Notes (Signed)
ECHO at bedside.

## 2020-02-02 NOTE — Progress Notes (Signed)
PROGRESS NOTE  Katherine [name] PPI:[id number] DOB: 1930-12-19 DOA: 02/01/2020 PCP: [name], MD  Brief History   The patient is a 86 yr old woman who presents to the ED complaining of dizziness in particular when she moves her head. She states that she had sudden onset of dizziness at 8 AM on 02/01/2020 when she went to get out of bed. She admits that she has a history of dizziness, but not as severe. She denies any nausea, vomiting, headaches, fevers, or chills or sick contacts.  The patient has a past medical history significant for CVA, HTN, DM II, hyperlipidemia, CAD, Chronic diastolic CHF, PAF (not on oral anticoagulation). She states that she has been compliant with her medications. At baseline the patient ambulates with a walker.   MRI was performed and demonstrated no acute intracranial abnormality on 02/01/2020.   Neurology was contacted from the ED. They recommended admission for TIA/CVA work up. In particular they have recommended MRA without contrast and MRA neck with and without, an echocardiogram, continue home Plavix, atorvastatin. They have also recommended investigation as to why anticoagulation for the patient's atrial fibrillation had previously been discontinued. They recommend perhaps restarting anticoagulation depending on reason for discontinuation, and replacing Plavix with ASA 81 mg. They recommend a permissive approach to controlling blood pressures. They also recommend a vestibular evaluation by PT/OT and a speech consult. The stroke team will follow.    [facility] hospital[facility] were consulted to admit the patient for further evaluation and treatment.   Consultants  . Neurology/Stroke Team  Procedures  . None  Antibiotics   Anti-infectives (From admission, onward)   None    .  Subjective  The patient is resting comfortably. No new complaints. She continues to have dizziness  Objective   Vitals:  Vitals:   02/02/20 1147 02/02/20 1230  BP: (!) 117/54 115/79    Pulse: 64 66  Resp: 18 12  Temp:    SpO2: 100% 100%    Exam:  Constitutional:  . The patient is awake, alert, and oriented x 3. No acute distress. Respiratory:  . No increased work of breathing. . No wheezes, rales, or rhonchi . No tactile fremitus Cardiovascular:  . Regular rate and rhythm . No murmurs, ectopy, or gallups. . No lateral PMI. No thrills. Abdomen:  . Abdomen is soft, non-tender, non-distended . No hernias, masses, or organomegaly . Normoactive bowel sounds.  Musculoskeletal:  . No cyanosis, clubbing, or edema Skin:  . No rashes, lesions, ulcers . palpation of skin: no induration or nodules Neurologic:  . CN 2-12 intact . Sensation all 4 extremities intact . Moving all extremities Psychiatric:  . Mental status o Mood, affect appropriate o Orientation to person, place, time  . judgment and insight appear intact  I have personally reviewed the following:   Today's Data  . Vitals, CBC, BMP, Urinalysis  Micro Data  Urine culture is pending.  Imaging  . MRA demonstrated mod to severe stenosis of the distal baslinar artery. There is 60% stenosis of the proximal right internal carotid artery. Normal left carotid system. There is no emergent large vessel occlusion. [facility] MRI demonstrated old posterior left MCA teritory infarct and there are findings of the chronic ischemic microangiopathy.  . There is a focus of hyperintense T1 weighted signal within the pituitary gland. This may be a proteinaceous cyst. . CT head: No acute intracranial abnormalities . Old left posteroior MCA distribution infarct  Cardiology Data  . Echocardiogram is pending.  Scheduled Meds: .  acetaminophen  1,000 mg Oral Once  . amLODipine  5 mg Oral Daily   And  . irbesartan  300 mg Oral Daily  . atorvastatin  40 mg Oral Daily  . enoxaparin (LOVENOX) injection  40 mg Subcutaneous Q24H  . ferrous gluconate  324 mg Oral Q breakfast  . insulin aspart  0-5 Units Subcutaneous QHS  .  insulin aspart  0-9 Units Subcutaneous TID WC  . lidocaine  1 patch Transdermal Daily  . pantoprazole  40 mg Oral Daily  . [START ON 02/04/2020] Vitamin D (Ergocalciferol)  50,000 Units Oral Q Sat   Continuous Infusions: . lactated ringers 50 mL/hr at 02/02/20 1052   Principal Problem:   Dizziness Active Problems:   Arterial ischemic stroke, vertebrobasilar, brainstem, acute (HCC)   Essential hypertension   Cerebrovascular disease   Mass of urinary bladder   Hematuria   CAD (coronary artery disease)   Chronic diastolic CHF (congestive heart failure) (HCC)  A & P  Dizziness: DDx - BPPV, Possible small brainstem MRI negative stroke. MRA demonstrated mod to severe stenosis of the distal baslinar artery. There is 60% stenosis of the proximal right internal carotid artery. Normal left carotid system. There is no emergent large vessel occlusion. o MRI demonstrated old posterior left MCA teritory infarct and there are findings of the chronic ischemic microangiopathy.  o There is a focus of hyperintense T1 weighted signal within the pituitary gland. This may be a proteinaceous cyst. o CT head: No acute intracranial abnormalities o Old left posteroior MCA distribution infarct. PT/OT was has been consulted for vestibular therapy. o Permissive approach to hypertension.   Echocardiogram is pending.   Anticoagulation for atrial fibrillation is contraindicated due to the  patient's recent history of hematuria due to bladder mass seen  on cystoscopy 01/23/2020.   History of cerebrovascular disease: Continue plavix, lipitor  CAD: Continue Plavix, Lipitor, metoprolol.  Chronic diastolic heart failure: monitor volume status.  I have seen and examined this patient myself. I have spent 38 minutes in her evaluation and care.  DVT prophylaxis: Lovenox CODE STATUS: Full Code Family Communication: None available Disposition: Status is: Inpatient  Remains inpatient appropriate because:Inpatient  level of care appropriate due to severity of illness  Dispo:  Patient From: Home  Planned Disposition: Home  Expected discharge date: 02/04/20  Medically stable for discharge: No   Elber Galyean, DO [facility] Direct contact: see [email or web address]  7PM-7AM contact night coverage as above 02/02/2020, 1:53 PM  LOS: 0 days

## 2020-02-02 NOTE — ED Notes (Signed)
Paged A. Zierle-Ghosh to [employer]

## 2020-02-02 NOTE — Progress Notes (Signed)
  Echocardiogram 2D Echocardiogram with definity and bubble study has been performed.  Darlina Sicilian M 02/02/2020, 11:26 AM

## 2020-02-02 NOTE — ED Notes (Signed)
Pt refusing PO Tylenol for pain, pt reports allergy to Tylenol, pt requesting pain relief. This RN has Network engineer page hospitalist to be made aware.

## 2020-02-02 NOTE — H&P (Signed)
History and Physical  [name] KGY:[id number] DOB: 02/05/1931 DOA: 02/01/2020  Referring physician: Dr. Christy Gentles, [place]  PCP: [name], MD  Outpatient Specialists: Neurology Patient coming from: Home.  Chief Complaint: Dizziness  HPI: Katherine [name] is a 86 y.o. female with medical history significant for prior CVA, essential hypertension, type 2 diabetes, hyperlipidemia, coronary artery disease, chronic diastolic CHF, paroxysmal atrial fibrillation not on oral anticoagulation who presented to [facility] ED due to sudden onset dizziness, associated with difficulty ambulating around 8 AM on 02/01/2020 when she tried to get out of bed.  At baseline she is able to ambulate with a walker.  She endorses history of dizziness but not as severe.  She denies any nausea, vomiting, headaches, fevers, or chills or sick contacts.  Reports compliance with her medications.  Currently on Plavix and Lipitor which she took the morning of her presentation.  Neurology was consulted by EDP who recommended admission by [facility] for TIA or possible stroke work-up.  ED Course: Persistent mild dizziness at the time of this exam.  MRI brain done on 02/01/2020 showed no acute intracranial abnormality, large old posterior left MCA territory infarct and findings of chronic ischemic microangiopathy.  Please see complete radiology report.  Review of Systems: Review of systems as noted in the HPI. All other systems reviewed and are negative.   Past Medical History:  Diagnosis Date   Anemia    mild   Anginal pain (HCC)    Aortic stenosis    Atrial fibrillation (HCC)    Bowel obstruction ([place]) 2015   PSBO   Bowel obstruction ([place]) 10/2017   CAD (coronary artery disease)    Carotid artery stenosis    CHF (congestive heart failure) (HCC)    Chronic kidney disease    renal insufficiency - DR Florene Glen yearly   Diabetes mellitus without complication ([place])    type 2   Full dentures    History of hiatal hernia     Hypertension    Mobility impaired    uses walker for ambulation   Stroke ([place])    right sided weakness,walker   Wears glasses    Past Surgical History:  Procedure Laterality Date   ABDOMINAL HYSTERECTOMY     angiodysplasia     APPENDECTOMY     BREAST DUCTAL SYSTEM EXCISION  03/02/2012   Procedure: EXCISION DUCTAL SYSTEM BREAST;  Surgeon: Adin Hector, MD;  Location: [facility];  Service: General;  Laterality: Left;   BREAST EXCISIONAL BIOPSY     BREAST SURGERY     bx rt and lt    CATARACT EXTRACTION     left   CHOLECYSTECTOMY     COLONOSCOPY N/A 10/15/2012   Procedure: COLONOSCOPY;  Surgeon: Beryle Beams, MD;  Location: [facility] END[facility]COPY;  Service: Endoscopy;  Laterality: N/A;   CYSTOSCOPY WITH BIOPSY N/A 01/24/2020   Procedure: CYSTOSCOPY WITH BLADDER BIOPSY AND FULGERATION;  Surgeon: Robley Fries, MD;  Location: [facility];  Service: Urology;  Laterality: N/A;   DILATION AND CURETTAGE OF UTERUS     DVT     ENTEROSCOPY N/A 11/17/2014   Procedure: ENTEROSCOPY;  Surgeon: Carol Ada, MD;  Location: [facility];  Service: Endoscopy;  Laterality: N/A;   ESOPHAGOGASTRODUODENOSCOPY N/A 09/10/2012   Procedure: ESOPHAGOGASTRODUODENOSCOPY (EGD);  Surgeon: Beryle Beams, MD;  Location: [facility];  Service: Endoscopy;  Laterality: N/A;   EYE SURGERY     right-glaucoma   HOT HEMOSTASIS N/A 09/10/2012   Procedure: HOT  HEMOSTASIS (ARGON PLASMA COAGULATION/BICAP);  Surgeon: Beryle Beams, MD;  Location: [facility];  Service: Endoscopy;  Laterality: N/A;   HOT HEMOSTASIS N/A 11/17/2014   Procedure: HOT HEMOSTASIS (ARGON PLASMA COAGULATION/BICAP);  Surgeon: Carol Ada, MD;  Location: [facility];  Service: Endoscopy;  Laterality: N/A;   TUBAL LIGATION      Social History:  reports that she quit smoking about 17 years ago. She has never used smokeless tobacco. She reports that she does not drink alcohol and does not use drugs.   Allergies    Allergen Reactions   Aspirin Hives   Blue Dyes (Parenteral) Hives   Ibuprofen Hives   Contrast Media [Iodinated Diagnostic Agents] Other (See Comments)    Unknown reaction   Iohexol Other (See Comments)    Unknown reaction   Shellfish-Derived Products Hives    Family History  Problem Relation Age of Onset   Heart disease Sister    Heart disease Brother    Heart disease Mother    Heart disease Father    Heart disease Daughter        Prior to Admission medications   Medication Sig Start Date End Date Taking? Authorizing Provider  Accu-Chek FastClix Lancets MISC daily. 10/18/18   [provider]  ACCU-CHEK GUIDE test strip 1 strip daily. 10/18/18   [provider]  amLODipine-valsartan (EXFORGE) 5-320 MG tablet Take 1 tablet by mouth daily.  01/14/16   [provider]  atorvastatin (LIPITOR) 40 MG tablet Take 40 mg by mouth daily.    [provider]  bimatoprost (LUMIGAN) 0.01 % SOLN Place 1 drop into both eyes at bedtime.    [provider]  Blood Glucose Monitoring Suppl (ACCU-CHEK GUIDE) w/Device KIT daily.  10/18/18   [provider]  brimonidine (ALPHAGAN P) 0.1 % SOLN Place 1 drop into both eyes 3 (three) times daily.     [provider]  cephALEXin (KEFLEX) 250 MG capsule Take 250 mg by mouth at bedtime.     [provider]  clopidogrel (PLAVIX) 75 MG tablet Take 1 tablet (75 mg total) by mouth daily. 06/04/17   [name], MD  dicyclomine (BENTYL) 10 MG capsule Take 10 mg by mouth 3 (three) times daily.  08/09/18   [provider]  dorzolamide (TRUSOPT) 2 % ophthalmic solution Place 1 drop into both eyes 3 (three) times daily.  08/09/18   [provider]  ergocalciferol (VITAMIN D2) 50000 units capsule Take 50,000 Units by mouth every Saturday.     [provider]  fentaNYL (DURAGESIC) 12 MCG/HR Place 1 patch onto the skin every 3 (three) days. 11/10/18   [provider]  ferrous gluconate (FERGON) 324 MG tablet Take 324 mg by mouth daily. 10/26/18   [provider]  insulin aspart protamine- aspart (NOVOLOG MIX 70/30) (70-30) 100 UNIT/ML injection Inject 0.2 mLs (20 Units total) into the skin 2 (two) times daily with a meal. Inject 20 units subcutaneously every morning and 10 units at night Patient taking differently: Inject 20-45 Units into the skin See admin instructions. Inject 45 units in the morning and 20 units at night 09/10/18   [name], MD  nitroGLYCERIN (NITRODUR - DOSED IN MG/24 HR) 0.4 mg/hr patch Place 0.4 mg onto the skin daily.  01/21/16   [provider]  nitroGLYCERIN (NITROSTAT) 0.4 MG SL tablet Place 1 tablet (0.4 mg total) under the tongue every 5 (five) minutes as needed for chest pain (CP or SOB). 12/23/15  [name], MD  omeprazole (PRILOSEC) 40 MG capsule Take 1 capsule (40 mg total) by mouth daily. 01/25/20   [name], MD  potassium chloride SA (K-DUR,KLOR-CON) 20 MEQ tablet Take 0.5 tablets (10 mEq total) by mouth daily. Patient taking differently: Take 20 mEq by mouth daily.  11/07/17   Dixie Dials, MD  torsemide (DEMADEX) 20 MG tablet Take 20 mg by mouth daily. 05/02/19   [provider]    Physical Exam: BP (!) 198/62    Pulse 62    Temp 98.4 F (36.9 C) (Oral)    Resp 16    SpO2 100%    General: 86 y.o. year-old female well developed well nourished in no acute distress.  Alert and oriented x3.  Cardiovascular: Regular rate and rhythm with no rubs or gallops.  No thyromegaly or JVD noted.  No lower extremity edema. 2/4 pulses in all 4 extremities.  Respiratory: Clear to auscultation with no wheezes or rales. Good inspiratory effort.  Abdomen: Soft nontender nondistended with normal bowel sounds x4 quadrants.  Muskuloskeletal: No cyanosis, clubbing or edema noted bilaterally  Neuro: CN II-XII intact, strength, sensation, reflexes  Skin: No ulcerative lesions noted or  rashes  Psychiatry: Judgement and insight appear normal. Mood is appropriate for condition and setting          Labs on Admission:  Basic Metabolic Panel: Recent Labs  Lab 02/01/20 1643  NA 141  K 5.2*  CL 104  CO2 23  GLUCOSE 158*  BUN 22  CREATININE 1.18*  CALCIUM 9.7  MG 2.3   Liver Function Tests: No results for input(s): AST, ALT, ALKPHOS, BILITOT, PROT, ALBUMIN in the last 168 hours. No results for input(s): LIPASE, AMYLASE in the last 168 hours. No results for input(s): AMMONIA in the last 168 hours. CBC: Recent Labs  Lab 02/01/20 1643  WBC 5.5  NEUTROABS 2.1  HGB 12.2  HCT 40.3  MCV 91.8  PLT 227   Cardiac Enzymes: No results for input(s): CKTOTAL, CKMB, CKMBINDEX, TROPONINI in the last 168 hours.  BNP (last 3 results) No results for input(s): BNP in the last 8760 hours.  ProBNP (last 3 results) No results for input(s): PROBNP in the last 8760 hours.  CBG: No results for input(s): GLUCAP in the last 168 hours.  Radiological Exams on Admission: CT Head Wo Contrast  Result Date: 02/01/2020 CLINICAL DATA:  Dizziness, sudden onset beginning this morning. History of strokes. Patient takes Plavix. EXAM: CT HEAD WITHOUT CONTRAST TECHNIQUE: Contiguous axial images were obtained from the base of the skull through the vertex without intravenous contrast. COMPARISON:  11/23/2004 FINDINGS: Brain: No evidence of acute infarction, hemorrhage, hydrocephalus, extra-axial collection or mass lesion/mass effect. Old left posterior MCA distribution infarct, stable. Vascular: No hyperdense vessel or unexpected calcification. Skull: Normal. Negative for fracture or focal lesion. Sinuses/Orbits: Globes and orbits are unremarkable. Sinuses and mastoid air cells are clear. Other: None. IMPRESSION: 1. No acute intracranial abnormalities. 2. Old left posterior MCA distribution infarct. Electronically Signed   By: Lajean Manes M.D.   On: 02/01/2020 17:36   MR ANGIO HEAD WO  CONTRAST  Result Date: 02/01/2020 CLINICAL DATA:  Sudden onset dizziness EXAM: MR HEAD WITHOUT CONTRAST MR CIRCLE OF WILLIS WITHOUT CONTRAST MRA OF THE NECK WITHOUT AND WITH CONTRAST TECHNIQUE: Multiplanar, multiecho pulse sequences of the brain, circle of willis and surrounding structures were obtained without intravenous contrast. Angiographic images of the neck were obtained using MRA technique without and with intravenous contrast. CONTRAST:  8.16m GADAVIST GADOBUTROL 1 MMOL/ML IV SOLN COMPARISON:  None. FINDINGS: MR HEAD FINDINGS Brain: No acute infarct, acute hemorrhage or extra-axial collection. There is a large, old posterior left MCA territory infarct. Early confluent hyperintense T2-weighted signal of the periventricular and deep white matter, most commonly due to chronic ischemic microangiopathy. Generalized volume loss. No chronic microhemorrhage. There is a focus of hyperintense T1-weighted signal within the pituitary gland (series 10, image 12). Vascular: Major flow voids are preserved. Skull and upper cervical spine: Normal calvarium and skull base. Visualized upper cervical spine and soft tissues are normal. Sinuses/Orbits:No paranasal sinus fluid levels or advanced mucosal thickening. No mastoid or middle ear effusion. Normal orbits. MR CIRCLE OF WILLIS FINDINGS POSTERIOR CIRCULATION: --Vertebral arteries: Normal --Inferior cerebellar arteries: Left PICA is normal. Right PICA is not clearly visualized. --Basilar artery: Moderate-to-severe stenosis of the distal basilar artery. --Superior cerebellar arteries: Normal. --Posterior cerebral arteries: Normal. ANTERIOR CIRCULATION: --Intracranial internal carotid arteries: Normal. --Anterior cerebral arteries (ACA): Normal. --Middle cerebral arteries (MCA): Normal. ANATOMIC VARIANTS: None MRA NECK FINDINGS Vertebral arteries are codominant. They are both normal to the vertebrobasilar confluence. There is approximately 60% stenosis of the proximal right  internal carotid artery. Normal left carotid system. Normal 3 vessel aortic branching pattern. IMPRESSION: 1. No acute intracranial abnormality. 2. Large, old posterior left MCA territory infarct and findings of chronic ischemic microangiopathy. 3. No emergent large vessel occlusion. 4. Moderate-to-severe stenosis of the distal basilar artery. 5. Approximately 60% stenosis of the proximal right internal carotid artery. 6. Focus of hyperintense T1-weighted signal within the pituitary gland, which may be a proteinaceous cyst. Electronically Signed   By: KUlyses JarredM.D.   On: 02/01/2020 23:01   MR ANGIO NECK W WO CONTRAST  Result Date: 02/01/2020 CLINICAL DATA:  Sudden onset dizziness EXAM: MR HEAD WITHOUT CONTRAST MR CIRCLE OF WILLIS WITHOUT CONTRAST MRA OF THE NECK WITHOUT AND WITH CONTRAST TECHNIQUE: Multiplanar, multiecho pulse sequences of the brain, circle of willis and surrounding structures were obtained without intravenous contrast. Angiographic images of the neck were obtained using MRA technique without and with intravenous contrast. CONTRAST:  8.521mGADAVIST GADOBUTROL 1 MMOL/ML IV SOLN COMPARISON:  None. FINDINGS: MR HEAD FINDINGS Brain: No acute infarct, acute hemorrhage or extra-axial collection. There is a large, old posterior left MCA territory infarct. Early confluent hyperintense T2-weighted signal of the periventricular and deep white matter, most commonly due to chronic ischemic microangiopathy. Generalized volume loss. No chronic microhemorrhage. There is a focus of hyperintense T1-weighted signal within the pituitary gland (series 10, image 12). Vascular: Major flow voids are preserved. Skull and upper cervical spine: Normal calvarium and skull base. Visualized upper cervical spine and soft tissues are normal. Sinuses/Orbits:No paranasal sinus fluid levels or advanced mucosal thickening. No mastoid or middle ear effusion. Normal orbits. MR CIRCLE OF WILLIS FINDINGS POSTERIOR CIRCULATION:  --Vertebral arteries: Normal --Inferior cerebellar arteries: Left PICA is normal. Right PICA is not clearly visualized. --Basilar artery: Moderate-to-severe stenosis of the distal basilar artery. --Superior cerebellar arteries: Normal. --Posterior cerebral arteries: Normal. ANTERIOR CIRCULATION: --Intracranial internal carotid arteries: Normal. --Anterior cerebral arteries (ACA): Normal. --Middle cerebral arteries (MCA): Normal. ANATOMIC VARIANTS: None MRA NECK FINDINGS Vertebral arteries are codominant. They are both normal to the vertebrobasilar confluence. There is approximately 60% stenosis of the proximal right internal carotid artery. Normal left carotid system. Normal 3 vessel aortic branching pattern. IMPRESSION: 1. No acute intracranial abnormality. 2. Large, old posterior left MCA territory infarct and findings of chronic ischemic microangiopathy. 3. No emergent large  vessel occlusion. 4. Moderate-to-severe stenosis of the distal basilar artery. 5. Approximately 60% stenosis of the proximal right internal carotid artery. 6. Focus of hyperintense T1-weighted signal within the pituitary gland, which may be a proteinaceous cyst. Electronically Signed   By: Ulyses Jarred M.D.   On: 02/01/2020 23:01   MR BRAIN WO CONTRAST  Result Date: 02/01/2020 CLINICAL DATA:  Sudden onset dizziness EXAM: MR HEAD WITHOUT CONTRAST MR CIRCLE OF WILLIS WITHOUT CONTRAST MRA OF THE NECK WITHOUT AND WITH CONTRAST TECHNIQUE: Multiplanar, multiecho pulse sequences of the brain, circle of willis and surrounding structures were obtained without intravenous contrast. Angiographic images of the neck were obtained using MRA technique without and with intravenous contrast. CONTRAST:  8.66m GADAVIST GADOBUTROL 1 MMOL/ML IV SOLN COMPARISON:  None. FINDINGS: MR HEAD FINDINGS Brain: No acute infarct, acute hemorrhage or extra-axial collection. There is a large, old posterior left MCA territory infarct. Early confluent hyperintense  T2-weighted signal of the periventricular and deep white matter, most commonly due to chronic ischemic microangiopathy. Generalized volume loss. No chronic microhemorrhage. There is a focus of hyperintense T1-weighted signal within the pituitary gland (series 10, image 12). Vascular: Major flow voids are preserved. Skull and upper cervical spine: Normal calvarium and skull base. Visualized upper cervical spine and soft tissues are normal. Sinuses/Orbits:No paranasal sinus fluid levels or advanced mucosal thickening. No mastoid or middle ear effusion. Normal orbits. MR CIRCLE OF WILLIS FINDINGS POSTERIOR CIRCULATION: --Vertebral arteries: Normal --Inferior cerebellar arteries: Left PICA is normal. Right PICA is not clearly visualized. --Basilar artery: Moderate-to-severe stenosis of the distal basilar artery. --Superior cerebellar arteries: Normal. --Posterior cerebral arteries: Normal. ANTERIOR CIRCULATION: --Intracranial internal carotid arteries: Normal. --Anterior cerebral arteries (ACA): Normal. --Middle cerebral arteries (MCA): Normal. ANATOMIC VARIANTS: None MRA NECK FINDINGS Vertebral arteries are codominant. They are both normal to the vertebrobasilar confluence. There is approximately 60% stenosis of the proximal right internal carotid artery. Normal left carotid system. Normal 3 vessel aortic branching pattern. IMPRESSION: 1. No acute intracranial abnormality. 2. Large, old posterior left MCA territory infarct and findings of chronic ischemic microangiopathy. 3. No emergent large vessel occlusion. 4. Moderate-to-severe stenosis of the distal basilar artery. 5. Approximately 60% stenosis of the proximal right internal carotid artery. 6. Focus of hyperintense T1-weighted signal within the pituitary gland, which may be a proteinaceous cyst. Electronically Signed   By: KUlyses JarredM.D.   On: 02/01/2020 23:01   DG Shoulder Left  Result Date: 02/02/2020 CLINICAL DATA:  Left shoulder pain EXAM: LEFT  SHOULDER - 2+ VIEW COMPARISON:  None. FINDINGS: Degenerative changes of the acromioclavicular and glenohumeral joints are seen. No fracture or dislocation is noted. No soft tissue abnormality is seen. Underlying bony thorax is within normal limits. IMPRESSION: Degenerative change without acute abnormality. Electronically Signed   By: MInez CatalinaM.D.   On: 02/02/2020 01:56    EKG: I independently viewed the EKG done and my findings are as followed: Normal sinus rhythm rate of 61 with no ST-T changes.  Assessment/Plan Present on Admission: **None**  Active Problems:   Dizziness  Acute dizziness, unclear etiology, possibly BPPV No evidence of acute stroke on MRI brain Unclear if dizziness is positional Get orthostatic vital signs and UA PT OT to assess for possible BPPV Fall precautions  Questionable TIA Seen by neurology, ongoing TIA work-up 2D echo with bubble ordered and pending Lipid panel, A1c Management per neurology  Essential hypertension BP is not at goal Resume home oral antihypertensives IV hydralazine as needed with parameters  Hyperkalemia Presented with serum potassium 5.2 No peaked T waves on twelve-lead EKG Hold off home oral potassium replacement Gentle IV fluid hydration LR at 50 cc/h x 1 day. Repeat renal panel in the morning  Type 2 diabetes with hyperglycemia A1c 7.8 on 01/20/2020 Start insulin sliding scale  Paroxysmal A. Fib Rate is currently controlled She is on Norvasc Not on oral anticoagulation  Iron deficiency anemia on oral supplement Resume home ferrous gluconate  GERD Stable Resume home omeprazole  Prior history of CVA Resume home Lipitor and Plavix  Generalized weakness Obtain B12 level, TSH PT OT assessment    DVT prophylaxis: Subcu Lovenox daily  Code Status: Full code per the patient herself.  Family Communication: None at bedside  Disposition Plan: Admit to telemetry medical  Consults called: Neurology consulted  by EDP  Admission status: Inpatient status.  Patient will require at least 2 midnights for further evaluation and treatment of present condition.   Status is: Inpatient    Dispo:  Patient From: Home  Planned Disposition: Home  Expected discharge date: 02/04/20  Medically stable for discharge: No, ongoing management of acute dizziness and generalized weakness.        Kayleen Memos MD [facility] Pager [phone]  If 7PM-7AM, please contact night-coverage [email or web address] Password [email or web address]  02/02/2020, 2:07 AM

## 2020-02-02 NOTE — ED Notes (Signed)
Lunch Tray Ordered @ 1027. °

## 2020-02-02 NOTE — Consult Note (Signed)
Neurology Consultation Reason for Consult: Dizziness  Requesting Physician: Dewaine Conger  CC: Dizziness   History is obtained from: Patient and chart review  HPI: Katherine [name] is a 86 y.o. female with a past medical history significant for reported atrial fibrillation not on anticoagulation (previously on warfarin), type 2 diabetes, hypertension, hyperlipidemia, obesity,coronary artery disease, congestive heart failure, aortic stenosis, chronic kidney disease, former tobacco use, and prior stroke (posterior R MCA territory with residual hemianopia, mild right sided weakness and mild aphasia).   She had a recent admission for biopsy of a bladder lesion seen on cystoscopy with fulguration of the abnormal bladder mucosa on 01/24/2020.  She reports she was told to stop her Plavix until her bleeding from this procedure has completely subsided.  Therefore she has not been taking her Plavix since 5 days before the procedure.  At this time she does think her bleeding has subsided.  LKW: evening of 11/30 tPA given?: No, due to out of the window Premorbid modified rankin scale:      2 - Slight disability. Able to look after own affairs without assistance, but unable to carry out all previous activities.  ROS: A 14 point ROS was performed and is negative except as noted in the HPI.   Past Medical History:  Diagnosis Date  . Anemia    mild  . Anginal pain ([place])   . Aortic stenosis   . Atrial fibrillation ([place])   . Bowel obstruction ([place]) 2015   PSBO  . Bowel obstruction ([place]) 10/2017  . CAD (coronary artery disease)   . Carotid artery stenosis   . CHF (congestive heart failure) ([place])   . Chronic kidney disease    renal insufficiency - DR Florene Glen yearly  . Diabetes mellitus without complication ([place])    type 2  . Full dentures   . History of hiatal hernia   . Hypertension   . Mobility impaired    uses walker for ambulation  . Stroke [facility])    right sided weakness,walker  . Wears glasses     Past Surgical History:  Procedure Laterality Date  . ABDOMINAL HYSTERECTOMY    . angiodysplasia    . APPENDECTOMY    . BREAST DUCTAL SYSTEM EXCISION  03/02/2012   Procedure: EXCISION DUCTAL SYSTEM BREAST;  Surgeon: Adin Hector, MD;  Location: [facility];  Service: General;  Laterality: Left;  . BREAST EXCISIONAL BIOPSY    . BREAST SURGERY     bx rt and lt   . CATARACT EXTRACTION     left  . CHOLECYSTECTOMY    . COLONOSCOPY N/A 10/15/2012   Procedure: COLONOSCOPY;  Surgeon: Beryle Beams, MD;  Location: [facility];  Service: Endoscopy;  Laterality: N/A;  . CYSTOSCOPY WITH BIOPSY N/A 01/24/2020   Procedure: CYSTOSCOPY WITH BLADDER BIOPSY AND FULGERATION;  Surgeon: Robley Fries, MD;  Location: [facility];  Service: Urology;  Laterality: N/A;  . DILATION AND CURETTAGE OF UTERUS    . DVT    . ENTEROSCOPY N/A 11/17/2014   Procedure: ENTEROSCOPY;  Surgeon: Carol Ada, MD;  Location: [facility];  Service: Endoscopy;  Laterality: N/A;  . ESOPHAGOGASTRODUODENOSCOPY N/A 09/10/2012   Procedure: ESOPHAGOGASTRODUODENOSCOPY (EGD);  Surgeon: Beryle Beams, MD;  Location[facility];  Service: Endoscopy;  Laterality: N/A;  . EYE SURGERY     right-glaucoma  . HOT HEMOSTASIS N/A 09/10/2012   Procedure: HOT HEMOSTASIS (ARGON PLASMA COAGULATION/BICAP);  Surgeon: Beryle Beams, MD;  Location: [facility];  Service: Endoscopy;  Laterality: N/A;  . HOT HEMOSTASIS N/A 11/17/2014   Procedure: HOT HEMOSTASIS (ARGON PLASMA COAGULATION/BICAP);  Surgeon: Carol Ada, MD;  Location: [facility];  Service: Endoscopy;  Laterality: N/A;  . TUBAL LIGATION     Current Outpatient Medications  Medication Instructions  . Accu-Chek FastClix Lancets MISC Daily  . ACCU-CHEK GUIDE test strip 1 strip, Daily  . amLODipine-valsartan (EXFORGE) 5-320 MG tablet 1 tablet, Oral, Daily  . atorvastatin (LIPITOR) 40 mg, Oral, Daily  . bimatoprost (LUMIGAN) 0.01 % SOLN 1 drop, Both Eyes, Daily at  bedtime  . Blood Glucose Monitoring Suppl (ACCU-CHEK GUIDE) w/Device KIT Daily  . brimonidine (ALPHAGAN P) 0.1 % SOLN 1 drop, Both Eyes, 3 times daily  . cephALEXin (KEFLEX) 250 mg, Oral, Daily at bedtime  . clopidogrel (PLAVIX) 75 mg, Oral, Daily  . dicyclomine (BENTYL) 10 mg, Oral, 3 times daily  . dorzolamide (TRUSOPT) 2 % ophthalmic solution 1 drop, Both Eyes, 3 times daily  . ergocalciferol (VITAMIN D2) 50,000 Units, Oral, Every Sat  . fentaNYL (DURAGESIC) 12 MCG/HR 1 patch, Transdermal, every 72 hours  . ferrous gluconate (FERGON) 324 mg, Oral, Daily  . insulin aspart protamine- aspart (NOVOLOG MIX 70/30) (70-30) 100 UNIT/ML injection 20 Units, Subcutaneous, 2 times daily with meals, Inject 20 units subcutaneously every morning and 10 units at night  . nitroGLYCERIN (NITRODUR - DOSED IN MG/24 HR) 0.4 mg, Transdermal, Daily  . nitroGLYCERIN (NITROSTAT) 0.4 mg, Sublingual, Every 5 min PRN  . omeprazole (PRILOSEC) 40 mg, Oral, Daily  . potassium chloride SA (K-DUR,KLOR-CON) 20 MEQ tablet 10 mEq, Oral, Daily  . torsemide (DEMADEX) 20 mg, Oral, Daily     Family History  Problem Relation Age of Onset  . Heart disease Sister   . Heart disease Brother   . Heart disease Mother   . Heart disease Father   . Heart disease Daughter     Social History:  reports that she quit smoking about 17 years ago. She has never used smokeless tobacco. She reports that she does not drink alcohol and does not use drugs.   Exam: Current vital signs: BP (!) 153/56 (BP Location: Left Arm)   Pulse 62   Temp 98.4 F (36.9 C) (Oral)   Resp 14   SpO2 96%  Vital signs in last 24 hours: Temp:  [98.3 F (36.8 C)-98.4 F (36.9 C)] 98.4 F (36.9 C) (12/01 1903) Pulse Rate:  [57-63] 62 (12/01 2042) Resp:  [14-20] 14 (12/01 2042) BP: (124-161)/(55-79) 153/56 (12/01 2042) SpO2:  [96 %-100 %] 96 % (12/01 2042)   Physical Exam  Constitutional: Appears well-developed and well-nourished.  Psych: Affect  appropriate to situation.  Tangential in her answers which may be secondary to her aphasia.  Cooperative and pleasant Eyes: No scleral injection HENT: No OP obstruction but reports some pain with right eye palpation.  No clear right eye lesion MSK: no joint deformities.  Cardiovascular: Normal rate and regular rhythm (does not seem irregular at all today) Respiratory: Effort normal, non-labored breathing GI: Soft.  No distension. There is no tenderness.  Skin: WDI  Neuro: Mental Status: Patient is awake, alert, oriented to person, place, month, year, and situation. Patient is able to give a clear and coherent history mostly but is often tangential and sometimes struggles with word finding especially for low frequency words. Repetition is also impaired.  Cranial Nerves: II: Visual Fields notable for right hemianopia, more dense in the upper quadrants than the lower .  Right pupil  postsurgical and nonreactive.  Left pupil slightly irregular but reactive III,IV, VI: EOMI without ptosis or diploplia, but reports discomfort on upgaze that she reports is due to feeling dizzy.  V: Facial sensation is symmetric to light touch VII: Facial movement is notable for very subtle right facial droop.  VIII: hearing is louder in the left than the right which she reports is baseline X: Uvula not well visualized XI: Shoulder shrug is symmetric. XII: tongue is midline without atrophy or fasciculations.  Motor: Tone is normal. Bulk is normal. 5/5 strength was present in all four extremities, other than some distal right upper extremity weakness and some mild right hip flexor weakness Sensory: Sensation is diminished on the right arm and leg Deep Tendon Reflexes: Increased in the right upper extremity and right lower extremity compared to the left Plantars: Toes are downgoing bilaterally.  Cerebellar: FNF and HKS are intact bilaterally  NIHSS total 0 for new deficits Otherwise technically 6 (2 for visual  field cut, 1 for facial droop, 2 for sensory loss, and 1 for aphasia)  I have reviewed labs in epic and the results pertinent to this consultation are: Creatinine 1.18, glucose 158  I have reviewed the images obtained:  Head CT with chronic left MCA territory stroke, no acute process MRA with significant basilar artery stenosis MRI brain w/ significant chronic microvascular disease  Impression: This is an 86 year old woman with multiple vascular risk factors as detailed above.  She presents with acute onset dizziness, and history is challenging secondary to her aphasia.  On examination she has difficulty with upgaze and reports that this reproduces some of her dizziness, though then she reports she just has trouble looking up and seeing in that direction.  Imaging reveals a significant basilar artery stenosis and a high burden of chronic microvascular disease particularly in the pons. I cannot exclude a peripheral source right now (diffucult to perform Marye Round by myself in hallway bed in ED). However, given she is symptomatic even when looking up lying in bed and her risk factors and basilar stenosis I think she will benefit from stroke workup, particularly echocardiogram to confirm there is no intracardiac thrombus that would require anticoagulation as well as clarification of whether she has actually had atrial fibrillation in the past (currently in sinus).  She may need a 30-day event monitor on discharge  Additionally, physical therapy evaluation for vestibular causes of dizziness (Dix-Hallpike and potential Epley) may also be useful because she reports her symptom onset was after she woke up and when she started to try to get out of bed which can be classic for BPPV  Recommendations:  # Possible small brainstem MRI negative stroke - Stroke labs HgbA1c, fasting lipid panel - MRI brain completed on my recs as above  - MRA of the brain without contrast and MRA neck w/wo completed on my  recs as above - Echocardiogram - Continue home Plavix and atorvastatin  - Consider replacing plavix with ASA 81 mg and starting anticoagulation for Atrial fibrillation after confirming why AC was stopped previously   - Risk factor modification - Telemetry monitoring; 30 day event monitor on discharge if no arrythmias captured and no clear history of Afib determined - Blood pressure goal  - Permissive hypertension to SBP of 200 due to basilar stenosis overnight, may start normalizing slowly tomorrow - PT consult, OT consult, Speech consult - Vestibular evaluation by PT with Marye Round  - Stroke team to follow  # Left shoulder pain -  Strength and sensation appear intact, topical analgesic vs. further eval with x-ray per primary team or ED     Lesleigh Noe MD-PhD [facility] [phone]

## 2020-02-02 NOTE — ED Provider Notes (Signed)
I assumed care in signout to follow-up on neurology recommendations. It was recommended patient be admitted for further stroke work-up.  Patient is awake alert no acute distress this time, but she does report left shoulder soreness after a fall.  She has diffuse tenderness on exam.  Left shoulder x-ray was performed and is negative.  Discussed with Dr. Nevada Crane for admission   Ripley Fraise, MD 02/02/20 [phone]

## 2020-02-03 ENCOUNTER — Encounter (HOSPITAL_COMMUNITY): Payer: Self-pay | Admitting: Internal Medicine

## 2020-02-03 DIAGNOSIS — G459 Transient cerebral ischemic attack, unspecified: Secondary | ICD-10-CM

## 2020-02-03 DIAGNOSIS — I6521 Occlusion and stenosis of right carotid artery: Secondary | ICD-10-CM

## 2020-02-03 DIAGNOSIS — I651 Occlusion and stenosis of basilar artery: Secondary | ICD-10-CM

## 2020-02-03 DIAGNOSIS — N3289 Other specified disorders of bladder: Secondary | ICD-10-CM | POA: Diagnosis not present

## 2020-02-03 DIAGNOSIS — Z9889 Other specified postprocedural states: Secondary | ICD-10-CM | POA: Diagnosis not present

## 2020-02-03 DIAGNOSIS — R42 Dizziness and giddiness: Secondary | ICD-10-CM | POA: Diagnosis not present

## 2020-02-03 DIAGNOSIS — I480 Paroxysmal atrial fibrillation: Secondary | ICD-10-CM

## 2020-02-03 DIAGNOSIS — I10 Essential (primary) hypertension: Secondary | ICD-10-CM | POA: Diagnosis not present

## 2020-02-03 LAB — CBC
HCT: 36.8 % (ref 36.0–46.0)
Hemoglobin: 11.6 g/dL — ABNORMAL LOW (ref 12.0–15.0)
MCH: 27.9 pg (ref 26.0–34.0)
MCHC: 31.5 g/dL (ref 30.0–36.0)
MCV: 88.5 fL (ref 80.0–100.0)
Platelets: 205 10*3/uL (ref 150–400)
RBC: 4.16 MIL/uL (ref 3.87–5.11)
RDW: 12.8 % (ref 11.5–15.5)
WBC: 4.9 10*3/uL (ref 4.0–10.5)
nRBC: 0 % (ref 0.0–0.2)

## 2020-02-03 LAB — RENAL FUNCTION PANEL
Albumin: 3.2 g/dL — ABNORMAL LOW (ref 3.5–5.0)
Anion gap: 8 (ref 5–15)
BUN: 18 mg/dL (ref 8–23)
CO2: 23 mmol/L (ref 22–32)
Calcium: 9.2 mg/dL (ref 8.9–10.3)
Chloride: 109 mmol/L (ref 98–111)
Creatinine, Ser: 1.11 mg/dL — ABNORMAL HIGH (ref 0.44–1.00)
GFR, Estimated: 48 mL/min — ABNORMAL LOW (ref >60)
Glucose, Bld: 172 mg/dL — ABNORMAL HIGH (ref 70–99)
Phosphorus: 2.7 mg/dL (ref 2.5–4.6)
Potassium: 4.2 mmol/L (ref 3.5–5.1)
Sodium: 140 mmol/L (ref 135–145)

## 2020-02-03 LAB — GLUCOSE, CAPILLARY
Glucose-Capillary: 148 mg/dL — ABNORMAL HIGH (ref 70–99)
Glucose-Capillary: 161 mg/dL — ABNORMAL HIGH (ref 70–99)
Glucose-Capillary: 177 mg/dL — ABNORMAL HIGH (ref 70–99)
Glucose-Capillary: 232 mg/dL — ABNORMAL HIGH (ref 70–99)

## 2020-02-03 MED ORDER — DIPHENHYDRAMINE HCL 25 MG PO CAPS
50.0000 mg | ORAL_CAPSULE | Freq: Once | ORAL | Status: AC
  Administered 2020-02-04: 50 mg via ORAL
  Filled 2020-02-03: qty 2

## 2020-02-03 MED ORDER — BRIMONIDINE TARTRATE 0.15 % OP SOLN
1.0000 [drp] | Freq: Three times a day (TID) | OPHTHALMIC | Status: DC
  Administered 2020-02-03 – 2020-02-05 (×8): 1 [drp] via OPHTHALMIC
  Filled 2020-02-03: qty 5

## 2020-02-03 MED ORDER — PREDNISONE 20 MG PO TABS
50.0000 mg | ORAL_TABLET | Freq: Four times a day (QID) | ORAL | Status: AC
  Administered 2020-02-03 – 2020-02-04 (×3): 50 mg via ORAL
  Filled 2020-02-03 (×3): qty 2

## 2020-02-03 MED ORDER — LATANOPROST 0.005 % OP SOLN
1.0000 [drp] | Freq: Every day | OPHTHALMIC | Status: DC
  Administered 2020-02-03 – 2020-02-04 (×2): 1 [drp] via OPHTHALMIC
  Filled 2020-02-03: qty 2.5

## 2020-02-03 MED ORDER — DIPHENHYDRAMINE HCL 50 MG/ML IJ SOLN: 50.0000 mg | Freq: Once | INTRAMUSCULAR | Status: AC

## 2020-02-03 MED ORDER — DORZOLAMIDE HCL 2 % OP SOLN
1.0000 [drp] | Freq: Three times a day (TID) | OPHTHALMIC | Status: DC
  Administered 2020-02-03 – 2020-02-05 (×8): 1 [drp] via OPHTHALMIC
  Filled 2020-02-03: qty 10

## 2020-02-03 NOTE — Care Management (Addendum)
Patient wanting a family meeting to discuss her full scope of treatment. Discuss with 5 c management how many people can come to the family meeting. Max of 3. Explained to patient.   Patient would like her sister Katherine [name] [phone] (cell) , per notes staff have left [name] 2 messages and are awaiting call back. NCM confirmed number with patient. Patient provided [place] home phone [phone].   Patient would also like sister Katherine [name] [phone].  Patient's third person is her niece Katherine [name] [phone]   All numbers confirmed with patient.    Reached [name] on her home phone and discussed family meeting. Only 3 people allowed at one time and only for the meeting. DR Erlinda Hong available anytime.   [name] will contact Katherine [name] and Katherine [name] and see what time they are availble today. Katherine [name] has to work but she will tell Katherine [name] to call her Katherine [name]) on her cell phone during the meeting. [name] has NCM direct cell phone number and will call NCM with a time.    Spoke to Katherine [name], her and Katherine [name] will be here at 2:30 pm for meeting, they will call [name]. Dr Erlinda Hong aware and NCM management to let [facility] know.   [name] RN

## 2020-02-03 NOTE — Progress Notes (Signed)
STROKE TEAM PROGRESS NOTE   INTERVAL HISTORY Had family meeting with "daughter" Katherine [name] and sister Katherine [name].  Another sister Katherine [name] is not available over the phone.  We had long discussion at bedside, updated pt current condition, imaging findings, further work-up options and treatment plan, and answered all the questions.  Patient decided to do CTA head and neck with preparation first. If BA stenosis confirmed, may consider further cerebral angiogram and PA stenting.  Family and patient expressed agreement, understanding and appreciation.    Vitals:   02/02/20 2019 02/03/20 0052 02/03/20 0318 02/03/20 0804  BP: 124/65 (!) 132/57 (!) 129/59 (!) 124/56  Pulse: 67 71 62   Resp: 18 19 16    Temp: 98.7 F (37.1 C) 98.7 F (37.1 C) 98.8 F (37.1 C)   TempSrc: Oral Oral Oral   SpO2: 98% 99% 96%    CBC:  Recent Labs  Lab 02/01/20 1643 02/01/20 1643 02/02/20 0720 02/03/20 0244  WBC 5.5   < > 5.2 4.9  NEUTROABS 2.1  --   --   --   HGB 12.2   < > 12.3 11.6*  HCT 40.3   < > 40.4 36.8  MCV 91.8   < > 92.7 88.5  PLT 227   < > 207 205   < > = values in this interval not displayed.   Basic Metabolic Panel:  Recent Labs  Lab 02/01/20 1643 02/01/20 1643 02/02/20 0720 02/03/20 0244  NA 141   < > 142 140  K 5.2*   < > 4.6 4.2  CL 104   < > 105 109  CO2 23   < > 23 23  GLUCOSE 158*   < > 125* 172*  BUN 22   < > 17 18  CREATININE 1.18*   < > 1.04* 1.11*  CALCIUM 9.7   < > 9.5 9.2  MG 2.3  --   --   --   PHOS  --   --  2.8 2.7   < > = values in this interval not displayed.   Lipid Panel:  Recent Labs  Lab 02/02/20 1858  CHOL 244*  TRIG 246*  HDL 41  CHOLHDL 6.0  VLDL 49*  LDLCALC 154*   HgbA1c: No results for input(s): HGBA1C in the last 168 hours. Urine Drug Screen: No results for input(s): LABOPIA, COCAINSCRNUR, LABBENZ, AMPHETMU, THCU, LABBARB in the last 168 hours.  Alcohol Level No results for input(s): ETH in the last 168 hours.  IMAGING past 24 hours ECHOCARDIOGRAM  COMPLETE BUBBLE STUDY  Result Date: 02/02/2020    ECHOCARDIOGRAM REPORT   Patient Name:   Katherine [name] Date of Exam: 02/02/2020 Medical Rec #:  [id number]     Height:       67.0 in Accession #:    [id number]    Weight:       189.0 lb Date of Birth:  1930/08/09    BSA:          1.974 m Patient Age:    86 years      BP:           140/62 mmHg Patient Gender: F             HR:           61 bpm. Exam Location:  Inpatient Procedure: 2D Echo, Color Doppler, Cardiac Doppler, Saline Contrast Bubble Study            and Intracardiac Opacification Agent Indications:  TIA (transient ischemic attack) 435.9 / G45.9  History:        Patient has no prior history of Echocardiogram examinations.                 CHF, CAD, Stroke; Risk Factors:Diabetes, Dyslipidemia and                 Hypertension. Sudden onset dizziness, associated with difficulty                 ambulating around. Chronic kidney disease.  Sonographer:    Darlina Sicilian RDCS Referring Phys: [id number] [place] [facility]  1. Technically difficult; suggest TEE if clinically indicated; negative saline microcavitation study.  2. Left ventricular ejection fraction, by estimation, is >75%. The left ventricle has hyperdynamic function. The left ventricle has no regional wall motion abnormalities. There is mild left ventricular hypertrophy of the basal-septal segment. Left ventricular diastolic parameters are consistent with Grade I diastolic dysfunction (impaired relaxation).  3. Right ventricular systolic function is normal. The right ventricular size is normal. Tricuspid regurgitation signal is inadequate for assessing PA pressure.  4. The mitral valve is normal in structure. No evidence of mitral valve regurgitation. No evidence of mitral stenosis.  5. The aortic valve has an indeterminant number of cusps. Aortic valve regurgitation is not visualized. Mild to moderate aortic valve stenosis.  6. The inferior vena cava is normal in size with greater than 50%  respiratory variability, suggesting right atrial pressure of 3 mmHg. FINDINGS  Left Ventricle: Left ventricular ejection fraction, by estimation, is >75%. The left ventricle has hyperdynamic function. The left ventricle has no regional wall motion abnormalities. Definity contrast agent was given IV to delineate the left ventricular endocardial borders. The left ventricular internal cavity size was normal in size. There is mild left ventricular hypertrophy of the basal-septal segment. Left ventricular diastolic parameters are consistent with Grade I diastolic dysfunction  (impaired relaxation). Right Ventricle: The right ventricular size is normal. No increase in right ventricular wall thickness. Right ventricular systolic function is normal. Tricuspid regurgitation signal is inadequate for assessing PA pressure. The tricuspid regurgitant velocity is 2.14 m/s, and with an assumed right atrial pressure of 8 mmHg, the estimated right ventricular systolic pressure is 45.3 mmHg. Left Atrium: Left atrial size was normal in size. Right Atrium: Right atrial size was normal in size. Pericardium: There is no evidence of pericardial effusion. Mitral Valve: The mitral valve is normal in structure. Mild mitral annular calcification. No evidence of mitral valve regurgitation. No evidence of mitral valve stenosis. Tricuspid Valve: The tricuspid valve is normal in structure. Tricuspid valve regurgitation is mild . No evidence of tricuspid stenosis. Aortic Valve: The aortic valve has an indeterminant number of cusps. Aortic valve regurgitation is not visualized. Mild to moderate aortic stenosis is present. Aortic valve mean gradient measures 12.2 mmHg. Aortic valve peak gradient measures 23.5 mmHg. Aortic valve area, by VTI measures 1.35 cm. Pulmonic Valve: The pulmonic valve was not well visualized. Pulmonic valve regurgitation is not visualized. No evidence of pulmonic stenosis. Aorta: The aortic root is normal in size and  structure. Venous: The inferior vena cava is normal in size with greater than 50% respiratory variability, suggesting right atrial pressure of 3 mmHg. IAS/Shunts: Agitated saline contrast was given intravenously to evaluate for intracardiac shunting. Additional Comments: Technically difficult; suggest TEE if clinically indicated; negative saline microcavitation study.  LEFT VENTRICLE PLAX 2D LVIDd:         4.10 cm  Diastology LVIDs:         1.60 cm     LV e' medial:    7.07 cm/s LV PW:         0.90 cm     LV E/e' medial:  9.9 LV IVS:        0.90 cm     LV e' lateral:   7.94 cm/s LVOT diam:     1.90 cm     LV E/e' lateral: 8.8 LV SV:         70 LV SV Index:   35 LVOT Area:     2.84 cm  LV Volumes (MOD) LV vol d, MOD A2C: 96.6 ml LV vol d, MOD A4C: 58.5 ml LV vol s, MOD A2C: 29.9 ml LV vol s, MOD A4C: 26.5 ml LV SV MOD A2C:     66.7 ml LV SV MOD A4C:     58.5 ml LV SV MOD BP:      50.8 ml RIGHT VENTRICLE RV S prime:     10.90 cm/s TAPSE (M-mode): 2.5 cm LEFT ATRIUM             Index LA diam:        3.20 cm 1.62 cm/m LA Vol (A2C):   57.2 ml 28.97 ml/m LA Vol (A4C):   24.4 ml 12.36 ml/m LA Biplane Vol: 38.0 ml 19.25 ml/m  AORTIC VALVE AV Area (Vmax):    1.26 cm AV Area (Vmean):   1.32 cm AV Area (VTI):     1.35 cm AV Vmax:           242.60 cm/s AV Vmean:          160.800 cm/s AV VTI:            0.520 m AV Peak Grad:      23.5 mmHg AV Mean Grad:      12.2 mmHg LVOT Vmax:         107.50 cm/s LVOT Vmean:        74.700 cm/s LVOT VTI:          0.247 m LVOT/AV VTI ratio: 0.48  AORTA Ao Root diam: 3.00 cm Ao Asc diam:  3.50 cm MITRAL VALVE                TRICUSPID VALVE MV Area (PHT): 2.37 cm     TR Peak grad:   18.3 mmHg MV Decel Time: 320 msec     TR Vmax:        214.00 cm/s MV E velocity: 70.00 cm/s MV A velocity: 103.00 cm/s  SHUNTS MV E/A ratio:  0.68         Systemic VTI:  0.25 m                             Systemic Diam: 1.90 cm Kirk Ruths MD Electronically signed by Kirk Ruths MD Signature  Date/Time: 02/02/2020/1:48:06 PM    Final     PHYSICAL EXAM   Temp:  [98.7 F (37.1 C)-99.2 F (37.3 C)] 98.8 F (37.1 C) (12/03 0318) Pulse Rate:  [62-71] 62 (12/03 0318) Resp:  [10-19] 16 (12/03 0318) BP: (115-139)/(54-82) 124/56 (12/03 0804) SpO2:  [95 %-100 %] 96 % (12/03 0318)  General - Well nourished, well developed, in no apparent distress.  Ophthalmologic - fundi not visualized due to noncooperation.  Cardiovascular - Regular rhythm and rate, not in afib.  Mental Status -  Level of arousal and orientation to time, place, and person were intact. Language including expression, naming, repetition, comprehension was assessed and found intact. Mild to moderate stuttering speech Fund of Knowledge was assessed and was intact.  Cranial Nerves II - XII - II - Visual field intact OU. III, IV, VI - Extraocular movements intact. V - Chronic right facial light touch decrease. VII - Facial movement intact bilaterally. VIII - Hearing & vestibular intact bilaterally. X - Palate elevates symmetrically. XI - Chin turning & shoulder shrug intact bilaterally. XII - Tongue protrusion intact.  Motor Strength - The patient's strength was symmetrical in all extremities, BUE 5/5 and BLE 3/5 and pronator drift was absent.  Bulk was normal and fasciculations were absent.   Motor Tone - Muscle tone was assessed at the neck and appendages and was normal.  Reflexes - The patient's reflexes were symmetrical in all extremities and she had no pathological reflexes.  Sensory - Light touch, temperature/pinprick were assessed and were chronically decreased on the right.    Coordination - The patient had normal movements in the hands with no ataxia or dysmetria.  Tremor was absent.  Gait and Station - deferred.   ASSESSMENT/PLAN Katherine [name] is a 86 y.o. female with history of reported atrial fibrillation not on anticoagulation (previously on warfarin), type 2 diabetes, hypertension,  hyperlipidemia, obesity,coronary artery disease, congestive heart failure, aortic stenosis, chronic kidney disease, former tobacco use, and prior stroke (posterior R MCA territory with residual hemianopia, mild right sided weakness and mild aphasia) with bladder bx 01/24/2020 off plavix 5 days prior presenting with dizziness.   TIA w/ vertigo in setting of possible BA stenosis    CT head No acute abnormality. Old L posterior MCA infarct.   MRI  No acute abnormality. Old large L MCA infarct.   MRA head & neck no ELVO, moderate to severe distal BA stenosis, proximal R ICA 60% stenosis. Hyperintense pituitary on T1 (?proteinaceous cyst).  CTA head and neck pending with preparation given iodine allergy  2D Echo EF >75%. No source of embolus   LDL 165  HgbA1c 7.8  VTE prophylaxis - Lovenox 40 mg sq daily   clopidogrel 75 mg daily prior to admission (off 5 days prior to bladder bx on 01/24/2020), now on plavix 75mg  daily (ASA allergy)  Therapy recommendations:  SNF  Disposition:  pending   BA stenosis  MRA head & neck no ELVO, moderate to severe distal BA stenosis, proximal R ICA 60% stenosis. Hyperintense pituitary on T1 (?proteinaceous cyst). On my review, BA stenosis on MRA head but not on the MRA neck.   Consider CTA head and neck with allergy preparation protocol first - pending  If BA stenosis confirmed, will discuss with Dr. Estanislado Pandy next week to consider BA stenting  NO HX Atrial Fibrillation  Home anticoagulation:  none (she thought previously on warfarin)  No clear documentation of afib that I can find - spoke with Dr. Terrence Dupont who reported NO hx of AF or AC treatment, does have hx old stroke  Hx Stroke   posterior R MCA territory with residual hemianopia, mild right sided weakness and mild aphasia - timing unclear  Carotid stenosis  MRA neck showed right ICA 60% stenosis - asymptomatic  S/p left CEA in 2003 with Dr. Donnetta Hutching - had a preoperative neurologic event as  manifested by speech difficulties left hemiparesis, and had good return of neurologic function.  CUS 11/2015 40-59% stenosis involving the right ICA based on diastolic  velocity. However, based on systolic velocity and ratio, stenosis is in 60-79% range.   CUS 01/2017 - right ICA 1-39% stenosis  Followed with [facility]netta [facility]ching at [facility]  Need continue to follow up with dr. Donnetta Hutching at [facility]  Hypertension  High on arrival, now Stable . Avoid low BP . Long-term BP goal 130-150 given BA stenosis  Hyperlipidemia  Home meds:  lipitor 40, resumed in hospital  LDL 165, goal < 70  Increase lipitor to 80  Continue statin at discharge  Diabetes type II Uncontrolled  HgbA1c 7.8, goal < 7.0  CBGs  SSI  Close PCP follow up for better DM control  Other Stroke Risk Factors  Advanced Age >/= 44   Former Cigarette smoker  Obesity  Coronary artery disease  Congestive heart failure  Aortic stenosis   Other Active Problems  CKD IIIa Cre 1.18->1.11  Hyperkalemia 5.2, EKG ok. Stopped oral replacement. Repeat 4.6->4.2  Fe deficiency anemia on oral supplement  GERD  Generalized weakness  Hospital day # 1  I spent  35 minutes in total face-to-face time with the patient, more than 50% of which was spent in counseling and coordination of care, reviewing test results, images and medication, and discussing the diagnosis, treatment plan and potential prognosis. This patient's care requiresreview of multiple databases, neurological assessment, discussion with family, other specialists and medical decision making of high complexity. Had family meeting with "daughter" Katherine [name] and sister Katherine [name] at the bedside, updated pt current condition, imaging findings, further work-up options and treatment plan, and answered all the questions. Family and patient expressed agreement, understanding and appreciation.  I also discussed with Dr. Benny Lennert.   Rosalin Hawking, MD PhD Stroke Neurology 02/03/2020 9:51 AM  To  contact Stroke Continuity provider, please refer to [email or web address] After hours, contact General Neurology

## 2020-02-03 NOTE — Progress Notes (Signed)
PROGRESS NOTE  ANGIE PIERCEY DDU:[id number] DOB: 05-12-1930 DOA: 02/01/2020 PCP: [name], MD  Brief History   The patient is a 86 yr old woman who presents to the ED complaining of dizziness in particular when she moves her head. She states that she had sudden onset of dizziness at 8 AM on 02/01/2020 when she went to get out of bed. She admits that she has a history of dizziness, but not as severe. She denies any nausea, vomiting, headaches, fevers, or chills or sick contacts.  The patient has a past medical history significant for CVA, HTN, DM II, hyperlipidemia, CAD, Chronic diastolic CHF, PAF (not on oral anticoagulation). She states that she has been compliant with her medications. At baseline the patient ambulates with a walker.   MRI was performed and demonstrated no acute intracranial abnormality on 02/01/2020.   Neurology was contacted from the ED. They recommended admission for TIA/CVA work up. In particular they have recommended MRA without contrast and MRA neck with and without, an echocardiogram, continue home Plavix, atorvastatin. They have also recommended investigation as to why anticoagulation for the patient's atrial fibrillation had previously been discontinued. They recommend perhaps restarting anticoagulation depending on reason for discontinuation, and replacing Plavix with ASA 81 mg. They recommend a permissive approach to controlling blood pressures. They also recommend a vestibular evaluation by PT/OT and a speech consult. The stroke team will follow.    [facility] were consulted to admit the patient for further evaluation and treatment.   Dr. Erlinda Hong had a family conference this afternoon to discuss goals of care and the family's desire for invasive procedures for this 86 yr old woman. At the conclusion of this discussion the family had decided to forward with CTA head and neck with prophylactic steroid preparation followed by vascular intervention with stents if  advised.  Consultants  . Neurology/Stroke Team  Procedures  . None  Antibiotics   Anti-infectives (From admission, onward)   None     Subjective  The patient is resting comfortably. No new complaints. She continues to have dizziness, but states that it is improved.  Objective   Vitals:  Vitals:   02/03/20 0804 02/03/20 1108  BP: (!) 124/56 (!) 114/58  Pulse:  (!) 55  Resp:  14  Temp:    SpO2:  96%    Exam:  Constitutional:  . The patient is awake, alert, and oriented x 3. No acute distress, although she states that she is still dizzy. Respiratory:  . No increased work of breathing. . No wheezes, rales, or rhonchi . No tactile fremitus Cardiovascular:  . Regular rate and rhythm . No murmurs, ectopy, or gallups. . No lateral PMI. No thrills. Abdomen:  . Abdomen is soft, non-tender, non-distended . No hernias, masses, or organomegaly . Normoactive bowel sounds.  Musculoskeletal:  . No cyanosis, clubbing, or edema Skin:  . No rashes, lesions, ulcers . palpation of skin: no induration or nodules Neurologic:  . CN 2-12 intact . Sensation all 4 extremities intact . Moving all extremities Psychiatric:  . Mental status o Mood, affect appropriate o Orientation to person, place, time  . judgment and insight appear intact  I have personally reviewed the following:   Today's Data  . Vitals, CBC, BMP  Micro Data  Urine culture is pending.  Imaging  . MRA demonstrated mod to severe stenosis of the distal baslinar artery. There is 60% stenosis of the proximal right internal carotid artery. Normal left carotid system. There is no  emergent large vessel occlusion. Marland Kitchen MRI demonstrated old posterior left MCA teritory infarct and there are findings of the chronic ischemic microangiopathy.  . There is a focus of hyperintense T1 weighted signal within the pituitary gland. This may be a proteinaceous cyst. . CT head: No acute intracranial abnormalities . Old left  posteroior MCA distribution infarct  Cardiology Data  . Echocardiogram is pending.  Scheduled Meds: . acetaminophen  1,000 mg Oral Once  . amLODipine  5 mg Oral Daily   And  . irbesartan  300 mg Oral Daily  . atorvastatin  80 mg Oral Daily  . brimonidine  1 drop Both Eyes TID  . clopidogrel  75 mg Oral Daily  . diphenhydrAMINE  50 mg Oral Once   Or  . diphenhydrAMINE  50 mg Intravenous Once  . dorzolamide  1 drop Both Eyes TID  . enoxaparin (LOVENOX) injection  40 mg Subcutaneous Q24H  . ferrous gluconate  324 mg Oral Q breakfast  . insulin aspart  0-5 Units Subcutaneous QHS  . insulin aspart  0-9 Units Subcutaneous TID WC  . latanoprost  1 drop Both Eyes QHS  . lidocaine  1 patch Transdermal Daily  . pantoprazole  40 mg Oral Daily  . predniSONE  50 mg Oral Q6H  . [START ON 02/04/2020] Vitamin D (Ergocalciferol)  50,000 Units Oral Q Sat   Continuous Infusions:  Principal Problem:   Dizziness Active Problems:   Arterial ischemic stroke, vertebrobasilar, brainstem, acute (HCC)   Essential hypertension   Cerebrovascular disease   Mass of urinary bladder   Hematuria   CAD (coronary artery disease)   Chronic diastolic CHF (congestive heart failure) (HCC)  A & P  Dizziness: DDx - BPPV, Possible small brainstem MRI negative stroke. MRA demonstrated mod to severe stenosis of the distal baslinar artery. There is 60% stenosis of the proximal right internal carotid artery. Normal left carotid system. There is no emergent large vessel occlusion. o MRI demonstrated old posterior left MCA teritory infarct and there are findings of the chronic ischemic microangiopathy.  o There is a focus of hyperintense T1 weighted signal within the pituitary gland. This may be a proteinaceous cyst. o CT head: No acute intracranial abnormalities o Old left posteroior MCA distribution infarct. PT/OT was has been consulted for vestibular therapy. o Permissive approach to hypertension.   Echocardiogram  is pending.   Anticoagulation for atrial fibrillation is contraindicated due to the  patient's recent history of hematuria due to bladder mass seen  on cystoscopy 01/23/2020. The patient's family has decided to go forward with prophylactic protocol for her to have CTA head and neck. If that study demonstrates significant stenosis, they would want her to go forward with stenting by [facility].   History of cerebrovascular disease: Continue plavix, lipitor  CAD: Continue Plavix, Lipitor, metoprolol.  Chronic diastolic heart failure: monitor volume status.  I have seen and examined this patient myself. I have spent 32 minutes in her evaluation and care.  DVT prophylaxis: Lovenox CODE STATUS: Full Code Family Communication: None available Disposition: Status is: Inpatient  Remains inpatient appropriate because:Inpatient level of care appropriate due to severity of illness  Dispo:  Patient From: Home  Planned Disposition: Home  Expected discharge date: 02/04/20  Medically stable for discharge: No   Lekha Dancer, DO [facility] Direct contact: see [email or web address]  7PM-7AM contact night coverage as above 02/03/2020, 4:10 PM  LOS: 0 days

## 2020-02-03 NOTE — TOC Initial Note (Signed)
Transition of Care [facility]) - Initial/Assessment Note    Patient Details  Name: Katherine [name] MRN: [id number] Date of Birth: [birth year]  [facility]) CM/SW Contact:    Bethann Berkshire, [place] Phone Number: 02/03/2020, 10:30 AM  Clinical Narrative:                  CSW met with pt to discuss SNF recommendation. Pt from independent senior living at [facility] in [place]. Pt states she has aides come in daily for about 2 hours. Pt does not want SNF and states she feels well enough to go home. She has been to a SNF in the past about 20 years ago but doesn't remember which one; she insists she does not need SNF. Pt is open to [facility] and reports having [facility] in the past. Pt reports having 4 sisters. She gives CSW consent to call 1 sister, Burman Nieves.   CSW called and left message with Mignon Pine (Sister)  [phone] (Mobile)  Expected Discharge Plan: [place] Barriers to Discharge: Continued Medical Work up   Patient Goals and CMS Choice Patient states their goals for this hospitalization and ongoing recovery are:: Pt does not want SNF currently. Wants to return home      Expected Discharge Plan and Services Expected Discharge Plan: [place]                                              Prior Living Arrangements/Services   Lives with:: Self Patient language and need for interpreter reviewed:: Yes Do you feel safe going back to the place where you live?: Yes      Need for Family Participation in Patient Care: Yes (Comment) Care giver support system in place?: Yes (comment) Current home services: Homehealth aide Criminal Activity/Legal Involvement Pertinent to Current Situation/Hospitalization: No - Comment as needed  Activities of Daily Living      Permission Sought/Granted   Permission granted to share information with : Yes, Verbal Permission Granted  Share Information with NAME: Mignon Pine  (Sister) [phone] ([place])           Emotional Assessment Appearance:: Appears stated age Attitude/Demeanor/Rapport: Engaged Affect (typically observed): Pleasant Orientation: : Oriented to Self, Oriented to Place, Oriented to  Time, Oriented to Situation Alcohol / Substance Use: Not Applicable Psych Involvement: No (comment)  Admission diagnosis:  Dizziness [R42] Strain of left shoulder, initial encounter [F64.332R] Patient Active Problem List   Diagnosis Date Noted   Dizziness 02/02/2020   Arterial ischemic stroke, vertebrobasilar, brainstem, acute ([place]) 02/02/2020   Essential hypertension 02/02/2020   Cerebrovascular disease 02/02/2020   Mass of urinary bladder 02/02/2020   Hematuria 02/02/2020   CAD (coronary artery disease) 02/02/2020   Chronic diastolic CHF (congestive heart failure) ([place]) 02/02/2020   Small bowel obstruction ([place]) 10/31/2017   Abdominal pain    Nausea and vomiting    Acute colitis 05/30/2017   Chest pain 12/21/2015   ACS (acute coronary syndrome) ([place]) 12/21/2015   Bowel obstruction ([place]) 10/15/2013   Angioedema due to seafood allergy 09/11/2013   Nipple discharge, bloody 01/07/2012   PCP:  [name], MD Pharmacy:   [facility] [place] [address] [place] [address] [place] [address] Phone: [phone] Fax: [phone]     Social Determinants of Health (SDOH)  Interventions    Readmission Risk Interventions No flowsheet data found.

## 2020-02-03 NOTE — Progress Notes (Signed)
Occupational Therapy Evaluation Patient Details Name: Katherine [name] MRN: [id number] DOB: 12/13/1930 Today's Date: 02/03/2020    History of Present Illness 86 y.o. female with a past medical history significant for reported atrial fibrillation not on anticoagulation (previously on warfarin), type 2 diabetes, hypertension, hyperlipidemia, obesity,coronary artery disease, congestive heart failure, aortic stenosis, chronic kidney disease, former tobacco use, and prior R MCA stroke. Pt presents with reports of dizziness. MRI negative, MRA head concerning for BA moderate stenosis.   Clinical Impression   PTA pt lives in [facility] and has an aid 7 days/wk @ 2 hrs/day who assists with bathing/deressing and meals. At baseline, pt is ambulatory and is able to do her self care when her aid is not present. Pt complaining of dizziness during session, which appears to worsen with positional head changes. Pt had 1 fall PTA due to dizziness. Feel pt appropriate to DC home with HHOT if she has 24/7 direct assistance with mobility given her high risk of falls. If 24/7 assistance not available, pt may need snf (unless dizziness resolves). Will follow acutely to facilitate safe DC. Pt would benefit form "fall alert" system. Discussed with SW. Will need to confirm amount of assistance available at family meeting.    Follow Up Recommendations  Home health OT;Supervision/Assistance - 24 hour    Equipment Recommendations  None recommended by OT    Recommendations for Other Services       Precautions / Restrictions Precautions Precautions: Fall Precaution Comments: dizziness Restrictions Weight Bearing Restrictions: No      Mobility Bed Mobility   General bed mobility comments: OOB in chair    Transfers Overall transfer level: Needs assistance Equipment used: Rolling walker (2 wheeled) Transfers: Sit to/from [facility] Sit to Stand: Min assist Stand pivot  transfers: Min assist            Balance Overall balance assessment: History of Falls;Needs assistance Sitting-balance support: No upper extremity supported;Feet supported Sitting balance-Leahy Scale: Good     Standing balance support: Single extremity supported;Bilateral upper extremity supported Standing balance-Leahy Scale: Poor Standing balance comment: reliant on UE support of RW                           ADL either performed or assessed with clinical judgement   ADL Overall ADL's : Needs assistance/impaired     Grooming: Set up   Upper Body Bathing: Set up;Sitting   Lower Body Bathing: Minimal assistance;Sit to/from stand   Upper Body Dressing : Set up;Sitting   Lower Body Dressing: Minimal assistance;Sit to/from stand   Toilet Transfer: Minimal Systems analyst Details (indicate cue type and reason): incontinenet during transfer Toileting- Water quality scientist and Hygiene: Min guard Toileting - Clothing Manipulation Details (indicate cue type and reason): usually uses depends     Functional mobility during ADLs: Minimal assistance;Rolling walker;Cueing for safety General ADL Comments: Complaints of dizziness with head movements     Vision Baseline Vision/History: Wears glasses Patient Visual Report: No change from baseline Additional Comments: Per chart R hemianopia form prior CVA     Perception     Praxis      Pertinent Vitals/Pain Pain Assessment: Faces Faces Pain Scale: Hurts little more Pain Location: L shoulder Pain Descriptors / Indicators: Sore Pain Intervention(s): Limited activity within patient's tolerance;Other (comment) (pain patch on shoulder)     Hand Dominance Right   Extremity/Trunk Assessment Upper Extremity Assessment Upper Extremity Assessment: RUE deficits/detail;LUE deficits/detail  RUE Deficits / Details: residual RUE weakness from prior CVA but uses functionally RUE Sensation:  decreased light touch LUE Deficits / Details: new onset of shoulder pain after fall; ROM overall WFL; using functionally without difficulty   Lower Extremity Assessment Lower Extremity Assessment: Defer to PT evaluation RLE Sensation: decreased light touch   Cervical / Trunk Assessment Cervical / Trunk Assessment: Kyphotic;Other exceptions Cervical / Trunk Exceptions: limited cervical rotation especially to R side   Communication Communication Communication: Expressive difficulties   Cognition Arousal/Alertness: Awake/alert Behavior During Therapy: WFL for tasks assessed/performed Overall Cognitive Status: No family/caregiver present to determine baseline cognitive functioning                          Awareness: Emergent  General Comments: most likely at baseline   General Comments      Exercises     Shoulder Instructions      Home Living Family/patient expects to be discharged to:: Private residence Living Arrangements: Alone Available Help at Discharge: Family;Available PRN/intermittently;Personal care attendant Type of Home: Independent living facility             Bathroom Shower/Tub: Walk-in shower   Bathroom Toilet: Standard Bathroom Accessibility: Yes How Accessible: Accessible via walker;Accessible via wheelchair Home Equipment: Gilford Rile - 2 wheels;Shower seat   Additional Comments: aide 7days a week, 1.5-1.75 hours a day      Prior Functioning/Environment Level of Independence: Needs assistance  Gait / Transfers Assistance Needed: pt ambulates with RW at baseline, reports she does need assistance with transfers at times ADL's / Homemaking Assistance Needed: aide assists with bathing and IADLs Communication / Swallowing Assistance Needed: expressive aphasia at baseline Comments: PCA 2 hrs/day 7 days/wk        OT Problem List: Decreased strength;Decreased range of motion;Decreased activity tolerance;Impaired balance (sitting and/or  standing);Decreased safety awareness;Decreased knowledge of use of DME or AE;Pain      OT Treatment/Interventions: Self-care/ADL training;Therapeutic exercise;DME and/or AE instruction;Therapeutic activities;Patient/family education;Balance training    OT Goals(Current goals can be found in the care plan section) Acute Rehab OT Goals Patient Stated Goal: for dizziness to get better and to go home OT Goal Formulation: With patient Time For Goal Achievement: 02/17/20 Potential to Achieve Goals: Good  OT Frequency: Min 2X/week   Barriers to D/C:            Co-evaluation              AM-PAC OT "6 Clicks" Daily Activity     Outcome Measure Help from another person eating meals?: None Help from another person taking care of personal grooming?: A Little Help from another person toileting, which includes using toliet, bedpan, or urinal?: A Little Help from another person bathing (including washing, rinsing, drying)?: A Little Help from another person to put on and taking off regular upper body clothing?: A Little Help from another person to put on and taking off regular lower body clothing?: A Little 6 Click Score: 19   End of Session Equipment Utilized During Treatment: Gait belt;Rolling walker Nurse Communication: Mobility status  Activity Tolerance: Patient tolerated treatment well Patient left: in chair;with call bell/phone within reach;with chair alarm set  OT Visit Diagnosis: Unsteadiness on feet (R26.81);Other abnormalities of gait and mobility (R26.89);History of falling (Z91.81);Muscle weakness (generalized) (M62.81);Pain;Dizziness and giddiness (R42) Pain - Right/Left: Left Pain - part of body: Shoulder                Time: 3151-7616  OT Time Calculation (min): 42 min Charges:  OT General Charges $OT Visit: 1 Visit OT Evaluation $OT Eval Moderate Complexity: 1 Mod OT Treatments $Self Care/Home Management : 23-37 mins  Maurie Boettcher, OT/L   Acute OT Clinical  Specialist Acute Rehabilitation Services Pager [phone] Office [phone]   [facility] 02/03/2020, 11:20 AM

## 2020-02-03 NOTE — Evaluation (Signed)
Physical Therapy Evaluation Patient Details Name: Katherine [name] MRN: [id number] DOB: 1930-09-18 Today's Date: 02/03/2020   History of Present Illness  86 y.o. female with a past medical history significant for reported atrial fibrillation not on anticoagulation (previously on warfarin), type 2 diabetes, hypertension, hyperlipidemia, obesity,coronary artery disease, congestive heart failure, aortic stenosis, chronic kidney disease, former tobacco use, and prior R MCA stroke. Pt presents with reports of dizziness. MRI negative, MRA head concerning for BA moderate stenosis.  Clinical Impression  Pt presents to PT with deficits in functional mobility, gait, balance, endurance, power, strength, and with reports of dizziness. Vestibular assessment documented below, limited by aphasia and impaired ability to follow commands consistently. Pt reports some dizziness at all times during session, however not as significant as symptoms resulting in admission. PT notes no nystagmus this session. Pt currently requires assistance to transfer and elects to only ambulate a very short distance at this time due to a fear of falling. Pt will benefit from further vestibular assessment as well as continued mobility training to reduce falls risk during this admission. PT recommends SNF placement at this time as the pt has limited caregiver support at home (aide 1/5-1/75 hours a day and PRN support from sisters) and she remains at an increased falls risk due to dizziness and mobility impairments. Pt unsure of SNF recommendation at this time.    Follow Up Recommendations SNF;Supervision/Assistance - 24 hour    Equipment Recommendations  None recommended by PT    Recommendations for Other Services       Precautions / Restrictions Precautions Precautions: Fall Precaution Comments: dizziness Restrictions Weight Bearing Restrictions: No      Mobility  Bed Mobility Overal bed mobility: Needs Assistance Bed Mobility:  Supine to Sit     Supine to sit: Min guard     General bed mobility comments: use of rails, increased time. Pt performs semi-fowler to long sit with use of rails and minA    Transfers Overall transfer level: Needs assistance Equipment used: Rolling walker (2 wheeled) Transfers: Stand Pivot Transfers;Sit to/from Stand Sit to Stand: Min assist Stand pivot transfers: Min guard          Ambulation/Gait Ambulation/Gait assistance: Min guard Gait Distance (Feet): 3 Feet Assistive device: Rolling walker (2 wheeled) Gait Pattern/deviations: Step-to pattern Gait velocity: reduced Gait velocity interpretation: <1.31 ft/sec, indicative of household ambulator General Gait Details: pt with short step-to gait, increased trunk flexion  Stairs            Wheelchair Mobility    Modified Rankin (Stroke Patients Only)       Balance Overall balance assessment: Needs assistance Sitting-balance support: No upper extremity supported;Feet supported Sitting balance-Leahy Scale: Fair     Standing balance support: Single extremity supported;Bilateral upper extremity supported Standing balance-Leahy Scale: Poor Standing balance comment: reliant on UE support of RW                             Pertinent Vitals/Pain Pain Assessment: No/denies pain    Home Living Family/patient expects to be discharged to:: Other (Comment) (independent living facility) Living Arrangements: Alone               Additional Comments: aide 7days a week, 1.5-1.75 hours a day    Prior Function Level of Independence: Needs assistance   Gait / Transfers Assistance Needed: pt ambulates with RW at baseline, reports she does need assistance with transfers at times  ADL's / Homemaking Assistance Needed: aide assists with bathing and IADLs        Hand Dominance   Dominant Hand: Right    Extremity/Trunk Assessment   Upper Extremity Assessment Upper Extremity Assessment: RUE  deficits/detail;LUE deficits/detail RUE Deficits / Details: ROM WFL, RUE at least 4-/5 based on observed mobility RUE Sensation: decreased light touch LUE Deficits / Details: ROM WFL, strength in shoulder limited due to pain, 4-/5 grossly    Lower Extremity Assessment Lower Extremity Assessment: Generalized weakness;RLE deficits/detail RLE Sensation: decreased light touch    Cervical / Trunk Assessment Cervical / Trunk Assessment: Kyphotic;Other exceptions Cervical / Trunk Exceptions: limited cervical rotation especially to R side  Communication   Communication: Expressive difficulties  Cognition Arousal/Alertness: Awake/alert Behavior During Therapy: WFL for tasks assessed/performed Overall Cognitive Status: Impaired/Different from baseline Area of Impairment: Following commands;Awareness;Problem solving                       Following Commands: Follows one step commands consistently;Follows multi-step commands inconsistently   Awareness: Emergent Problem Solving: Difficulty sequencing;Slow processing        General Comments General comments (skin integrity, edema, etc.): Vestibular assessment: 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 Pt reports mild dizziness throughout session, even prior to vestibular assessment beginning. Pt has difficulty following commands and maintaining attention to consistently perform pursuits but appears to have intact eye movements without nystagmus with multiple attempts at pursuits. Pt is unable to follow commands to perform vertical and horizontal VOR assessment. Pt performs L dix hallpike without noted nystagmus, however pt does not maintain eyes open and begins to track objects altering the validity of test. Pt is unable to rotate neck 45 degrees to right side to perform dix hallpike to R. Pt reports increased dizziness with head turns and with change in position, however not as significant as the symtpoms that resulted in this admission.      Exercises     Assessment/Plan    PT Assessment Patient needs continued PT services  PT Problem List Decreased strength;Decreased activity tolerance;Decreased balance;Decreased mobility;Decreased cognition;Decreased knowledge of use of DME;Decreased safety awareness;Decreased knowledge of precautions;Impaired sensation       PT Treatment Interventions DME instruction;Gait training;Functional mobility training;Therapeutic activities;Therapeutic exercise;Balance training;Neuromuscular re-education;Cognitive remediation;Patient/family education    PT Goals (Current goals can be found in the Care Plan section)  Acute Rehab PT Goals Patient Stated Goal: To resolve dizziness PT Goal Formulation: With patient Time For Goal Achievement: 02/17/20 Potential to Achieve Goals: Fair    Frequency Min 3X/week   Barriers to discharge Decreased caregiver support      Co-evaluation               AM-PAC PT "6 Clicks" Mobility  Outcome Measure Help needed turning from your back to your side while in a flat bed without using bedrails?: A Little Help needed moving from lying on your back to sitting on the side of a flat bed without using bedrails?: A Little Help needed moving to and from a bed to a chair (including a wheelchair)?: A Little Help needed standing up from a chair using your arms (e.g., wheelchair or bedside chair)?: A Little Help needed to walk in hospital room?: A Little Help needed climbing 3-5 steps with a railing? : Total 6 Click Score: 16    End of Session   Activity Tolerance: Patient tolerated treatment well Patient left: in chair;with call bell/phone within reach;with chair alarm set Nurse Communication: Mobility status  PT Visit Diagnosis: Other abnormalities of gait and mobility (R26.89);Muscle weakness (generalized) (M62.81);Other symptoms and signs involving the nervous system (R29.898)    Time: 3015-9968 PT Time Calculation (min) (ACUTE ONLY): 34  min   Charges:   PT Evaluation $PT Eval Moderate Complexity: 1 Mod PT Treatments $Therapeutic Activity: 8-22 mins        Zenaida Niece, PT, DPT Acute Rehabilitation Pager: [phone]   Zenaida Niece 02/03/2020, 9:26 AM

## 2020-02-04 ENCOUNTER — Encounter (HOSPITAL_COMMUNITY): Payer: Self-pay | Admitting: Internal Medicine

## 2020-02-04 ENCOUNTER — Inpatient Hospital Stay (HOSPITAL_COMMUNITY): Payer: Medicare Other

## 2020-02-04 DIAGNOSIS — I10 Essential (primary) hypertension: Secondary | ICD-10-CM | POA: Diagnosis not present

## 2020-02-04 DIAGNOSIS — R42 Dizziness and giddiness: Secondary | ICD-10-CM | POA: Diagnosis not present

## 2020-02-04 LAB — RENAL FUNCTION PANEL
Albumin: 3.3 g/dL — ABNORMAL LOW (ref 3.5–5.0)
Anion gap: 12 (ref 5–15)
BUN: 17 mg/dL (ref 8–23)
CO2: 21 mmol/L — ABNORMAL LOW (ref 22–32)
Calcium: 9.8 mg/dL (ref 8.9–10.3)
Chloride: 105 mmol/L (ref 98–111)
Creatinine, Ser: 0.88 mg/dL (ref 0.44–1.00)
GFR, Estimated: >60 mL/min (ref >60)
Glucose, Bld: 285 mg/dL — ABNORMAL HIGH (ref 70–99)
Phosphorus: 1.5 mg/dL — ABNORMAL LOW (ref 2.5–4.6)
Potassium: 4.6 mmol/L (ref 3.5–5.1)
Sodium: 138 mmol/L (ref 135–145)

## 2020-02-04 LAB — URINE CULTURE: Culture: >=100000 — AB

## 2020-02-04 LAB — GLUCOSE, CAPILLARY
Glucose-Capillary: 285 mg/dL — ABNORMAL HIGH (ref 70–99)
Glucose-Capillary: 317 mg/dL — ABNORMAL HIGH (ref 70–99)
Glucose-Capillary: 345 mg/dL — ABNORMAL HIGH (ref 70–99)
Glucose-Capillary: 339 mg/dL — ABNORMAL HIGH (ref 70–99)

## 2020-02-04 LAB — CBC
HCT: 38.3 % (ref 36.0–46.0)
Hemoglobin: 12.6 g/dL (ref 12.0–15.0)
MCH: 28.7 pg (ref 26.0–34.0)
MCHC: 32.9 g/dL (ref 30.0–36.0)
MCV: 87.2 fL (ref 80.0–100.0)
Platelets: 214 10*3/uL (ref 150–400)
RBC: 4.39 MIL/uL (ref 3.87–5.11)
RDW: 12.5 % (ref 11.5–15.5)
WBC: 3.9 10*3/uL — ABNORMAL LOW (ref 4.0–10.5)
nRBC: 0 % (ref 0.0–0.2)

## 2020-02-04 MED ORDER — IOHEXOL 350 MG/ML SOLN
75.0000 mL | Freq: Once | INTRAVENOUS | Status: AC | PRN
  Administered 2020-02-04: 75 mL via INTRAVENOUS

## 2020-02-04 NOTE — Progress Notes (Signed)
Occupational Therapy Treatment Patient Details Name: Katherine [name] MRN: [id number] DOB: 1930/04/11 Today's Date: 02/04/2020    History of present illness 86 y.o. female with a past medical history significant for reported atrial fibrillation not on anticoagulation (previously on warfarin), type 2 diabetes, hypertension, hyperlipidemia, obesity,coronary artery disease, congestive heart failure, aortic stenosis, chronic kidney disease, former tobacco use, and prior R MCA stroke. Pt presents with reports of dizziness. MRI negative, MRA head concerning for BA moderate stenosis.   OT comments  Pt. Seen for skilled OT treatment session.  Pt. Declines any eob/oob this session.  Agreeable to fall prevention education and review.  Hand outs provided and reviewed with pt.  Pt. Verbalized understanding and was also able to provide examples of fall prevention she had in place prior to hospitalization.    Follow Up Recommendations  Home health OT;Supervision/Assistance - 24 hour    Equipment Recommendations  None recommended by OT    Recommendations for Other Services      Precautions / Restrictions Precautions Precautions: Fall Precaution Comments: dizziness       Mobility Bed Mobility               General bed mobility comments: declined all eob/oob  Transfers                      Balance                                           ADL either performed or assessed with clinical judgement   ADL                                         General ADL Comments: pt. declined any oob/eob this day.  agreeable to fall prevention education and review.  able to describe some strategies and safety measures she had in place prior to hospitalization.  reports sitting when she starts to feel dizzy. reports not bathing without assistance. reports she has emergency pull strings located in b.room and main area of apt. reviewed handouts and provided with her  with additonal examples.  discussed ALWAYS wait a moment with positional changes to assess if she is feeling dizzy ie: supine to sit, sit to stand.  she verbalized understanding and states she does the same     Vision       Perception     Praxis      Cognition Arousal/Alertness: Awake/alert Behavior During Therapy: WFL for tasks assessed/performed Overall Cognitive Status: No family/caregiver present to determine baseline cognitive functioning                                 General Comments: some difficulty with word finding        Exercises     Shoulder Instructions       General Comments      Pertinent Vitals/ Pain       Pain Assessment: No/denies pain  Home Living                                          Prior Functioning/Environment  Frequency  Min 2X/week        Progress Toward Goals  OT Goals(current goals can now be found in the care plan section)  Progress towards OT goals: Progressing toward goals     Plan      Co-evaluation                 AM-PAC OT "6 Clicks" Daily Activity     Outcome Measure   Help from another person eating meals?: None Help from another person taking care of personal grooming?: A Little Help from another person toileting, which includes using toliet, bedpan, or urinal?: A Little Help from another person bathing (including washing, rinsing, drying)?: A Little Help from another person to put on and taking off regular upper body clothing?: A Little Help from another person to put on and taking off regular lower body clothing?: A Little 6 Click Score: 19    End of Session    OT Visit Diagnosis: Unsteadiness on feet (R26.81);Other abnormalities of gait and mobility (R26.89);History of falling (Z91.81);Muscle weakness (generalized) (M62.81);Pain;Dizziness and giddiness (R42) Pain - Right/Left: Left Pain - part of body: Shoulder   Activity Tolerance Patient  tolerated treatment well   Patient Left in bed;with call bell/phone within reach   Nurse Communication          Time: 1443-1540 OT Time Calculation (min): 8 min  Charges: OT General Charges $OT Visit: 1 Visit OT Treatments $Self Care/Home Management : 8-22 mins  Katherine [name], COTA/L Acute Rehabilitation [phone]   Katherine [name] 02/04/2020, 11:12 AM

## 2020-02-04 NOTE — Progress Notes (Signed)
STROKE TEAM PROGRESS NOTE   INTERVAL HISTORY Patient is sitting up in bed.  She is comfortable.  She has no complaints today.  She presented with transient episode of the world spinning around with difficulty with vision and feeling off balance.  She states this lasted barely less than a minute.  She does give a history suggestive of benign positional vertigo in the past.  MRI scan is negative for acute stroke CT angiogram shows no significant basilar artery stenosis that showed 70% proximal right ICA stenosis.  She does have residual right-sided hemisensory loss from a previous stroke.  She states she is allergic to aspirin and has upset stomach   Vitals:   02/03/20 1611 02/04/20 0020 02/04/20 0510 02/04/20 1223  BP: (!) 126/54 134/76 (!) 153/79 (!) 126/59  Pulse: 63 64 77 66  Resp: 16 18 18 16   Temp: 97.8 F (36.6 C) 98.6 F (37 C) 98.5 F (36.9 C) (!) 97.5 F (36.4 C)  TempSrc: Oral Oral Oral Oral  SpO2: 97% 93% 97% 99%  Weight:   85.1 kg   Height:       CBC:  Recent Labs  Lab 02/01/20 1643 02/02/20 0720 02/03/20 0244 02/04/20 0223  WBC 5.5   < > 4.9 3.9*  NEUTROABS 2.1  --   --   --   HGB 12.2   < > 11.6* 12.6  HCT 40.3   < > 36.8 38.3  MCV 91.8   < > 88.5 87.2  PLT 227   < > 205 214   < > = values in this interval not displayed.   Basic Metabolic Panel:  Recent Labs  Lab 02/01/20 1643 02/02/20 0720 02/03/20 0244 02/04/20 0223  NA 141   < > 140 138  K 5.2*   < > 4.2 4.6  CL 104   < > 109 105  CO2 23   < > 23 21*  GLUCOSE 158*   < > 172* 285*  BUN 22   < > 18 17  CREATININE 1.18*   < > 1.11* 0.88  CALCIUM 9.7   < > 9.2 9.8  MG 2.3  --   --   --   PHOS  --    < > 2.7 1.5*   < > = values in this interval not displayed.   Lipid Panel:  Recent Labs  Lab 02/02/20 1858  CHOL 244*  TRIG 246*  HDL 41  CHOLHDL 6.0  VLDL 49*  LDLCALC 154*   HgbA1c: No results for input(s): HGBA1C in the last 168 hours. Urine Drug Screen: No results for input(s): LABOPIA,  COCAINSCRNUR, LABBENZ, AMPHETMU, THCU, LABBARB in the last 168 hours.  Alcohol Level No results for input(s): ETH in the last 168 hours.  IMAGING past 24 hours CT ANGIO HEAD W OR WO CONTRAST  Result Date: 02/04/2020 CLINICAL DATA:  Stroke. EXAM: CT ANGIOGRAPHY HEAD AND NECK TECHNIQUE: Multidetector CT imaging of the head and neck was performed using the standard protocol during bolus administration of intravenous contrast. Multiplanar CT image reconstructions and MIPs were obtained to evaluate the vascular anatomy. Carotid stenosis measurements (when applicable) are obtained utilizing NASCET criteria, using the distal internal carotid diameter as the denominator. CONTRAST:  3mL OMNIPAQUE IOHEXOL 350 MG/ML SOLN COMPARISON:  CT head 02/01/2020, MRI 02/01/2020 FINDINGS: CT HEAD FINDINGS Brain: Moderately large area of chronic infarct in the left parietal lobe unchanged. Patchy white matter hypodensity bilaterally unchanged. Ventricle size normal. Negative for acute infarct, hemorrhage, mass.  Vascular: Negative for hyperdense vessel. Skull: Negative Sinuses: Mild mucosal edema paranasal sinuses. Orbits: Bilateral cataract extraction. Review of the MIP images confirms the above findings CTA NECK FINDINGS Aortic arch: Atherosclerotic calcification aortic arch without aneurysm. Bovine branching arch. Proximal great vessels patent without stenosis. Right carotid system: Atherosclerotic calcification right carotid bifurcation. Approximately 75% diameter stenosis proximal right internal carotid artery due to circumferential calcific plaque. Left carotid system: Atherosclerotic calcification left carotid bifurcation without significant stenosis. Vertebral arteries: Both vertebral arteries patent to the basilar without stenosis. Skeleton: Mild cervical degenerative change. No acute skeletal abnormality. Other neck: Negative for mass or adenopathy. Upper chest: Lung apices clear bilaterally. Review of the MIP images  confirms the above findings CTA HEAD FINDINGS Anterior circulation: Atherosclerotic calcification in the cavernous carotid bilaterally. Moderate stenosis on the right and mild stenosis on the left. Anterior and middle cerebral arteries patent bilaterally. No flow-limiting stenosis or large vessel occlusion. Posterior circulation: Both vertebral arteries patent to the basilar. Left PICA patent. Right PICA not visualized. Basilar widely patent. Superior cerebellar and posterior cerebral arteries patent bilaterally without stenosis or large vessel occlusion. Venous sinuses: Normal venous enhancement Anatomic variants: None Review of the MIP images confirms the above findings IMPRESSION: 1. Approximately 75% diameter stenosis right internal carotid artery proximally due to circumferential calcific stenosis 2. Atherosclerotic calcification left carotid bifurcation without stenosis 3. Both vertebral arteries widely patent to the basilar 4. No intracranial large vessel occlusion. 5. Moderate stenosis right cavernous carotid and mild stenosis left cavernous carotid. 6. Chronic infarct left parietal lobe. No acute intracranial abnormality. Electronically Signed   By: Franchot Gallo M.D.   On: 02/04/2020 08:51   CT ANGIO NECK W OR WO CONTRAST  Result Date: 02/04/2020 CLINICAL DATA:  Stroke. EXAM: CT ANGIOGRAPHY HEAD AND NECK TECHNIQUE: Multidetector CT imaging of the head and neck was performed using the standard protocol during bolus administration of intravenous contrast. Multiplanar CT image reconstructions and MIPs were obtained to evaluate the vascular anatomy. Carotid stenosis measurements (when applicable) are obtained utilizing NASCET criteria, using the distal internal carotid diameter as the denominator. CONTRAST:  35mL OMNIPAQUE IOHEXOL 350 MG/ML SOLN COMPARISON:  CT head 02/01/2020, MRI 02/01/2020 FINDINGS: CT HEAD FINDINGS Brain: Moderately large area of chronic infarct in the left parietal lobe unchanged.  Patchy white matter hypodensity bilaterally unchanged. Ventricle size normal. Negative for acute infarct, hemorrhage, mass. Vascular: Negative for hyperdense vessel. Skull: Negative Sinuses: Mild mucosal edema paranasal sinuses. Orbits: Bilateral cataract extraction. Review of the MIP images confirms the above findings CTA NECK FINDINGS Aortic arch: Atherosclerotic calcification aortic arch without aneurysm. Bovine branching arch. Proximal great vessels patent without stenosis. Right carotid system: Atherosclerotic calcification right carotid bifurcation. Approximately 75% diameter stenosis proximal right internal carotid artery due to circumferential calcific plaque. Left carotid system: Atherosclerotic calcification left carotid bifurcation without significant stenosis. Vertebral arteries: Both vertebral arteries patent to the basilar without stenosis. Skeleton: Mild cervical degenerative change. No acute skeletal abnormality. Other neck: Negative for mass or adenopathy. Upper chest: Lung apices clear bilaterally. Review of the MIP images confirms the above findings CTA HEAD FINDINGS Anterior circulation: Atherosclerotic calcification in the cavernous carotid bilaterally. Moderate stenosis on the right and mild stenosis on the left. Anterior and middle cerebral arteries patent bilaterally. No flow-limiting stenosis or large vessel occlusion. Posterior circulation: Both vertebral arteries patent to the basilar. Left PICA patent. Right PICA not visualized. Basilar widely patent. Superior cerebellar and posterior cerebral arteries patent bilaterally without stenosis or large vessel occlusion. Venous  sinuses: Normal venous enhancement Anatomic variants: None Review of the MIP images confirms the above findings IMPRESSION: 1. Approximately 75% diameter stenosis right internal carotid artery proximally due to circumferential calcific stenosis 2. Atherosclerotic calcification left carotid bifurcation without stenosis 3.  Both vertebral arteries widely patent to the basilar 4. No intracranial large vessel occlusion. 5. Moderate stenosis right cavernous carotid and mild stenosis left cavernous carotid. 6. Chronic infarct left parietal lobe. No acute intracranial abnormality. Electronically Signed   By: Franchot Gallo M.D.   On: 02/04/2020 08:51    PHYSICAL EXAM  Temp:  [97.5 F (36.4 C)-98.6 F (37 C)] 97.5 F (36.4 C) (12/04 1223) Pulse Rate:  [63-77] 66 (12/04 1223) Resp:  [16-18] 16 (12/04 1223) BP: (126-153)/(54-79) 126/59 (12/04 1223) SpO2:  [93 %-99 %] 99 % (12/04 1223) Weight:  [85.1 kg] 85.1 kg (12/04 0510)  General - Well nourished, well developed, in no apparent distress.  Ophthalmologic - fundi not visualized due to noncooperation.  Cardiovascular - Regular rhythm and rate, not in afib.  Mental Status -  Level of arousal and orientation to time, place, and person were intact. Language including expression, naming, repetition, comprehension was assessed and found intact. Fund of Knowledge was assessed and was intact.  Cranial Nerves II - XII - II - Visual field intact OU. III, IV, VI - Extraocular movements intact. V - Chronic right facial light touch decrease. VII - Facial movement intact bilaterally. VIII - Hearing & vestibular intact bilaterally. X - Palate elevates symmetrically. XI - Chin turning & shoulder shrug intact bilaterally. XII - Tongue protrusion intact.  Motor Strength - The patient's strength was symmetrical in all extremities, BUE 5/5 and BLE 3/5 and pronator drift was absent.  Bulk was normal and fasciculations were absent.   Motor Tone - Muscle tone was assessed at the neck and appendages and was normal.  Reflexes - The patient's reflexes were symmetrical in all extremities and she had no pathological reflexes.  Sensory - Light touch, temperature/pinprick were assessed and were chronically decreased on the right.    Coordination - The patient had normal movements  in the hands with no ataxia or dysmetria.  Tremor was absent.  Gait and Station - deferred.   ASSESSMENT/PLAN Ms. KELLAN BOEHLKE is a 86 y.o. female with history of reported atrial fibrillation not on anticoagulation (previously on warfarin), type 2 diabetes, hypertension, hyperlipidemia, obesity,coronary artery disease, congestive heart failure, aortic stenosis, chronic kidney disease, former tobacco use, and prior stroke (posterior R MCA territory with residual hemianopia, mild right sided weakness and mild aphasia) with bladder bx 01/24/2020 off plavix 5 days prior presenting with dizziness.   Transient vertigo possibly from peripheral vestibular dysfunction given prior history of positional vertigo.  CT head No acute abnormality. Old L posterior MCA infarct.   MRI  No acute abnormality. Old large L MCA infarct.   MRA head & neck no ELVO, moderate to severe distal BA stenosis, proximal R ICA 60% stenosis. Hyperintense pituitary on T1 (?proteinaceous cyst).  CTA H&N - Approximately 75% diameter stenosis right internal carotid artery proximally due to circumferential calcific stenosis. Atherosclerotic calcification left carotid bifurcation without stenosis. Both vertebral arteries widely patent to the basilar. No intracranial large vessel occlusion. Moderate stenosis right cavernous carotid and mild stenosis left cavernous carotid. Chronic infarct left parietal lobe. No acute intracranial abnormality.  2D Echo EF >75%. No source of embolus   LDL 165  HgbA1c 7.8  VTE prophylaxis - Lovenox 40 mg sq daily  clopidogrel 75 mg daily prior to admission (off 5 days prior to bladder bx on 01/24/2020), now on plavix 75mg  daily (ASA allergy)  Therapy recommendations:  SNF  Disposition:  pending   BA stenosis  MRA head & neck no ELVO, moderate to severe distal BA stenosis, proximal R ICA 60% stenosis. Hyperintense pituitary on T1 (?proteinaceous cyst). On my review, BA stenosis on MRA head but  not on the MRA neck.   Consider CTA head and neck with allergy preparation protocol first - see above  If BA stenosis confirmed, will discuss with Dr. Estanislado Pandy next week to consider BA stenting  NO HX Atrial Fibrillation  Home anticoagulation:  none (she thought previously on warfarin)  No clear documentation of afib that I can find - spoke with Dr. Terrence Dupont who reported NO hx of AF or AC treatment, does have hx old stroke  Hx Stroke   posterior R MCA territory with residual hemianopia, mild right sided weakness and mild aphasia - timing unclear  Carotid stenosis  MRA neck showed right ICA 60% stenosis - asymptomatic  S/p left CEA in 2003 with Dr. Donnetta Hutching - had a preoperative neurologic event as manifested by speech difficulties left hemiparesis, and had good return of neurologic function.  CUS 11/2015 40-59% stenosis involving the right ICA based on diastolic velocity. However, based on systolic velocity and ratio, stenosis is in 60-79% range.   CUS 01/2017 - right ICA 1-39% stenosis  Followed with Dr. Donnetta Hutching at [facility]  Need continue to follow up with Dr. Donnetta Hutching at [facility]  Hypertension  High on arrival, now Stable . Avoid low BP . Long-term BP goal 130-150 given BA stenosis  Hyperlipidemia  Home meds:  lipitor 40, resumed in hospital  LDL 165, goal < 70  Increase lipitor to 80  Continue statin at discharge  Diabetes type II Uncontrolled  HgbA1c 7.8, goal < 7.0  CBGs  SSI  Close PCP follow up for better DM control  Other Stroke Risk Factors  Advanced Age >/= 60   Former Cigarette smoker  Obesity  Coronary artery disease  Congestive heart failure  Aortic stenosis   Other Active Problems  CKD IIIa Cre 1.18->1.11->0.88  Hyperkalemia 5.2, EKG ok. Stopped oral replacement. Repeat 4.6->4.2->4.6  Fe deficiency anemia on oral supplement  GERD  Generalized weakness  Iodine allergy Hypophosphatemia - Phosphorus - 1.5   Hospital day # 2 Recommend  continue Plavix as patient is allergic to aspirin and maintain aggressive risk factor modification.  Recommend physical therapy consult for vestibular training.  Stroke team will sign off.  Kindly call for questions.  Discussed with Dr. Benny Lennert.  Greater than 50% time during this 25-minute visit was spent in counseling and coordination of care about dizziness and vertigo and discussion about stroke prevention and answering questions Antony Contras, MD  To contact Stroke Continuity provider, please refer to [email or web address] After hours, contact General Neurology

## 2020-02-04 NOTE — Progress Notes (Addendum)
PROGRESS NOTE  Katherine [name] RJJ:[id number] DOB: January 02, 1931 DOA: 02/01/2020 PCP: [name], MD  Brief History   The patient is a 86 yr old woman who presents to the ED complaining of dizziness in particular when she moves her head. She states that she had sudden onset of dizziness at 8 AM on 02/01/2020 when she went to get out of bed. She admits that she has a history of dizziness, but not as severe. She denies any nausea, vomiting, headaches, fevers, or chills or sick contacts.  The patient has a past medical history significant for CVA, HTN, DM II, hyperlipidemia, CAD, Chronic diastolic CHF, PAF (not on oral anticoagulation). She states that she has been compliant with her medications. At baseline the patient ambulates with a walker.   MRI was performed and demonstrated no acute intracranial abnormality on 02/01/2020.   Neurology was contacted from the ED. They recommended admission for TIA/CVA work up. In particular they have recommended MRA without contrast and MRA neck with and without, an echocardiogram, continue home Plavix, atorvastatin. They have also recommended investigation as to why anticoagulation for the patient's atrial fibrillation had previously been discontinued. They recommend perhaps restarting anticoagulation depending on reason for discontinuation, and replacing Plavix with ASA 81 mg. They recommend a permissive approach to controlling blood pressures. They also recommend a vestibular evaluation by PT/OT and a speech consult. The stroke team will follow.    [facility] were consulted to admit the patient for further evaluation and treatment.   Dr. Erlinda Hong had a family conference this afternoon to discuss goals of care and the family's desire for invasive procedures for this 86 yr old woman. At the conclusion of this discussion the family had decided to forward with CTA head and neck with prophylactic steroid preparation followed by vascular intervention with stents if  advised.  Consultants  . Neurology/Stroke Team  Procedures  . None  Antibiotics   Anti-infectives (From admission, onward)   None     Subjective  The patient is resting comfortably. No new complaints.   Objective   Vitals:  Vitals:   02/04/20 0510 02/04/20 1223  BP: (!) 153/79 (!) 126/59  Pulse: 77 66  Resp: 18 16  Temp: 98.5 F (36.9 C) (!) 97.5 F (36.4 C)  SpO2: 97% 99%    Exam:  Constitutional:  The patient is awake, alert, and oriented x 3. No acute distress. Respiratory:  . No increased work of breathing. . No wheezes, rales, or rhonchi . No tactile fremitus Cardiovascular:  . Regular rate and rhythm . No murmurs, ectopy, or gallups. . No lateral PMI. No thrills. Abdomen:  . Abdomen is soft, non-tender, non-distended . No hernias, masses, or organomegaly . Normoactive bowel sounds.  Musculoskeletal:  . No cyanosis, clubbing, or edema Skin:  . No rashes, lesions, ulcers . palpation of skin: no induration or nodules Neurologic:  . CN 2-12 intact . Sensation all 4 extremities intact . Moving all extremities Psychiatric:  . Mental status o Mood, affect appropriate o Orientation to person, place, time  . judgment and insight appear intact  I have personally reviewed the following:   Today's Data  . Vitals, CBC, BMP  Micro Data  Urine culture is pending.  Imaging  . MRA demonstrated mod to severe stenosis of the distal baslinar artery. There is 60% stenosis of the proximal right internal carotid artery. Normal left carotid system. There is no emergent large vessel occlusion. Marland Kitchen MRI demonstrated old posterior left MCA teritory infarct  and there are findings of the chronic ischemic microangiopathy.  . There is a focus of hyperintense T1 weighted signal within the pituitary gland. This may be a proteinaceous cyst. . CT head: No acute intracranial abnormalities . Old left posteroior MCA distribution infarct  Cardiology Data  . Echocardiogram is  pending.  Scheduled Meds: . acetaminophen  1,000 mg Oral Once  . amLODipine  5 mg Oral Daily   And  . irbesartan  300 mg Oral Daily  . atorvastatin  80 mg Oral Daily  . brimonidine  1 drop Both Eyes TID  . clopidogrel  75 mg Oral Daily  . dorzolamide  1 drop Both Eyes TID  . enoxaparin (LOVENOX) injection  40 mg Subcutaneous Q24H  . ferrous gluconate  324 mg Oral Q breakfast  . insulin aspart  0-5 Units Subcutaneous QHS  . insulin aspart  0-9 Units Subcutaneous TID WC  . latanoprost  1 drop Both Eyes QHS  . lidocaine  1 patch Transdermal Daily  . pantoprazole  40 mg Oral Daily  . Vitamin D (Ergocalciferol)  50,000 Units Oral Q Sat   Continuous Infusions:  Principal Problem:   Dizziness Active Problems:   Arterial ischemic stroke, vertebrobasilar, brainstem, acute (HCC)   Essential hypertension   Cerebrovascular disease   Mass of urinary bladder   Hematuria   CAD (coronary artery disease)   Chronic diastolic CHF (congestive heart failure) (HCC)  A & P  Dizziness: DDx - BPPV, Possible small brainstem MRI negative stroke. MRA demonstrated mod to severe stenosis of the distal baslinar artery. There is 60% stenosis of the proximal right internal carotid artery. Normal left carotid system. There is no emergent large vessel occlusion. o MRI demonstrated old posterior left MCA teritory infarct and there are findings of the chronic ischemic microangiopathy.  o There is a focus of hyperintense T1 weighted signal within the pituitary gland. This may be a proteinaceous cyst. o CT head: No acute intracranial abnormalities o Old left posteroior MCA distribution infarct. PT/OT was has been consulted for vestibular therapy. o Permissive approach to hypertension.   Echocardiogram is pending.   Anticoagulation for atrial fibrillation is contraindicated due to the  patient's recent history of hematuria due to bladder mass seen  on cystoscopy 01/23/2020. The patient's family has decided to go  forward with prophylactic protocol for her to have CTA head and neck. If that study demonstrates significant stenosis, they would want her to go forward with stenting by [facility].   History of cerebrovascular disease: Continue plavix, lipitor  CAD: Continue Plavix, Lipitor, metoprolol.  Chronic diastolic heart failure: monitor volume status.  I have seen and examined this patient myself. I have spent 28 minutes in her evaluation and care.  DVT prophylaxis: Lovenox CODE STATUS: Full Code Family Communication: None available Disposition: Status is: Inpatient  Remains inpatient appropriate because:Inpatient level of care appropriate due to severity of illness  Dispo:  Patient From: Home  Planned Disposition: SNF  Expected discharge date: 02/04/20  Medically stable for discharge: No   Jamiaya Bina, DO [facility] Direct contact: see [email or web address]  7PM-7AM contact night coverage as above 02/04/2020, 2:43 PM  LOS: 0 days

## 2020-02-05 DIAGNOSIS — I10 Essential (primary) hypertension: Secondary | ICD-10-CM | POA: Diagnosis not present

## 2020-02-05 LAB — RENAL FUNCTION PANEL
Albumin: 3.1 g/dL — ABNORMAL LOW (ref 3.5–5.0)
Anion gap: 10 (ref 5–15)
BUN: 20 mg/dL (ref 8–23)
CO2: 21 mmol/L — ABNORMAL LOW (ref 22–32)
Calcium: 9.2 mg/dL (ref 8.9–10.3)
Chloride: 108 mmol/L (ref 98–111)
Creatinine, Ser: 1.02 mg/dL — ABNORMAL HIGH (ref 0.44–1.00)
GFR, Estimated: 53 mL/min — ABNORMAL LOW (ref >60)
Glucose, Bld: 252 mg/dL — ABNORMAL HIGH (ref 70–99)
Phosphorus: 2.3 mg/dL — ABNORMAL LOW (ref 2.5–4.6)
Potassium: 4 mmol/L (ref 3.5–5.1)
Sodium: 139 mmol/L (ref 135–145)

## 2020-02-05 LAB — GLUCOSE, CAPILLARY
Glucose-Capillary: 237 mg/dL — ABNORMAL HIGH (ref 70–99)
Glucose-Capillary: 370 mg/dL — ABNORMAL HIGH (ref 70–99)
Glucose-Capillary: 320 mg/dL — ABNORMAL HIGH (ref 70–99)
Glucose-Capillary: 316 mg/dL — ABNORMAL HIGH (ref 70–99)

## 2020-02-05 MED ORDER — LIDOCAINE 5 % EX PTCH
1.0000 | MEDICATED_PATCH | Freq: Every day | CUTANEOUS | 0 refills | Status: DC
Start: 2020-02-06 — End: 2022-03-19

## 2020-02-05 MED ORDER — WHITE PETROLATUM EX OINT
TOPICAL_OINTMENT | CUTANEOUS | Status: AC
  Filled 2020-02-05: qty 28.35

## 2020-02-05 MED ORDER — ATORVASTATIN CALCIUM 80 MG PO TABS
80.0000 mg | ORAL_TABLET | Freq: Every day | ORAL | 0 refills | Status: DC
Start: 2020-02-06 — End: 2022-03-27

## 2020-02-05 NOTE — Progress Notes (Signed)
Patient CBG is 310  MD Swayze is aware. Pt. Can be discharged per MD.

## 2020-02-05 NOTE — Progress Notes (Signed)
Occupational Therapy Treatment Patient Details Name: Katherine [name] MRN: [id number] DOB: [birth year] Today's Date: 02/05/2020    History of present illness 86 y.o. female with a past medical history significant for reported atrial fibrillation not on anticoagulation (previously on warfarin), type 2 diabetes, hypertension, hyperlipidemia, obesity,coronary artery disease, congestive heart failure, aortic stenosis, chronic kidney disease, former tobacco use, and prior R MCA stroke. Pt presents with reports of dizziness. MRI negative, MRA head concerning for BA moderate stenosis.   OT comments  Pt. Seen for skilled OT treatment. Able to complete bed mobility mod I with hob elevated. Stand pivot to recliner min a.  Reviewed fall prevention again, pt. Able recall examples from yesterday.  Reports less dizziness today than previous days.    Follow Up Recommendations  Home health OT;Supervision/Assistance - 24 hour    Equipment Recommendations  None recommended by OT    Recommendations for Other Services      Precautions / Restrictions Precautions Precautions: Fall Precaution Comments: dizziness       Mobility Bed Mobility Overal bed mobility: Needs Assistance Bed Mobility: Supine to Sit     Supine to sit: HOB elevated;Supervision        Transfers Overall transfer level: Needs assistance Equipment used: Rolling walker (2 wheeled) Transfers: Sit to/from [facility] Sit to Stand: Min assist Stand pivot transfers: Min assist       General transfer comment: cues for hand placement and sequencing for safety. did reach back for arm rests for sitting without cues    Balance                                           ADL either performed or assessed with clinical judgement   ADL Overall ADL's : Needs assistance/impaired     Grooming: Set up;Sitting;Wash/dry hands;Wash/dry face;Oral care                 Lower Body Dressing Details  (indicate cue type and reason): attempted with pt. and she declined stating her aide helps her get dressed Toilet Transfer: RW;Stand-pivot;Minimal assistance Toilet Transfer Details (indicate cue type and reason): simulated during transfer from eob to recliner.  cues for hand placement and rw walker management. pt. reports using the b.room yesterday with assistance from cna   Toileting - Clothing Manipulation Details (indicate cue type and reason): usually uses depends     Functional mobility during ADLs: Minimal assistance;Rolling walker;Cueing for safety General ADL Comments: able to complete simulated toileting task today with limited report of any dizziness. declined lb adls stating she has assistanc for these     Vision       Perception     Praxis      Cognition Arousal/Alertness: Awake/alert Behavior During Therapy: WFL for tasks assessed/performed Overall Cognitive Status: No family/caregiver present to determine baseline cognitive functioning                                          Exercises     Shoulder Instructions       General Comments      Pertinent Vitals/ Pain       Pain Assessment: No/denies pain  Home Living  Prior Functioning/Environment              Frequency  Min 2X/week        Progress Toward Goals  OT Goals(current goals can now be found in the care plan section)  Progress towards OT goals: Progressing toward goals     Plan      Co-evaluation                 AM-PAC OT "6 Clicks" Daily Activity     Outcome Measure   Help from another person eating meals?: None Help from another person taking care of personal grooming?: A Little Help from another person toileting, which includes using toliet, bedpan, or urinal?: A Little Help from another person bathing (including washing, rinsing, drying)?: A Little Help from another person to put on and taking  off regular upper body clothing?: A Little Help from another person to put on and taking off regular lower body clothing?: A Little 6 Click Score: 19    End of Session Equipment Utilized During Treatment: Gait belt;Rolling walker  OT Visit Diagnosis: Unsteadiness on feet (R26.81);Other abnormalities of gait and mobility (R26.89);History of falling (Z91.81);Muscle weakness (generalized) (M62.81);Pain;Dizziness and giddiness (R42) Pain - Right/Left: Left Pain - part of body: Shoulder   Activity Tolerance Patient tolerated treatment well   Patient Left in chair;with call bell/phone within reach;with chair alarm set   Nurse Communication          Time: 2258-3462 OT Time Calculation (min): 20 min  Charges: OT General Charges $OT Visit: 1 Visit OT Treatments $Self Care/Home Management : 8-22 mins  Sonia Baller, COTA/L Acute Rehabilitation [phone]   Janice Coffin 02/05/2020, 11:39 AM

## 2020-02-05 NOTE — TOC Transition Note (Signed)
Transition of Care [facility]) - CM/SW Discharge Note   Patient Details  Name: Katherine [name] MRN: [id number] Date of Birth: May 21, 1930  [facility] CM/SW Contact:  Carles Collet, RN Phone Number: 02/05/2020, 4:04 PM   Clinical Narrative:   [name] w patient over the phone. She states that she wants to return home. She states that she has an aid 3 hours a day 5 days a week. She also has help from 4 sisters who live nearby. She has RW and WC at home, declines need for additional DME. She would like to use [facility] for [facility] services, as she did last year. [facility] accepted referral. No other CM needs identified.     Final next level of care: [place] Barriers to Discharge: No Barriers Identified   Patient Goals and CMS Choice Patient states their goals for this hospitalization and ongoing recovery are:: Pt does not want SNF currently. Wants to return home CMS Medicare.gov Compare Post Acute Care list provided to:: Patient Choice offered to / list presented to : Patient  Discharge Placement                       Discharge Plan and Services                          HH Arranged: PT, OT [facility] Agency: [place] ([facility]) Date [place]: 02/05/20 Time [place]: 1603 Representative spoke with at [place]: [place] ([place]) Interventions     Readmission Risk Interventions No flowsheet data found.

## 2020-02-05 NOTE — Discharge Summary (Signed)
Physician Discharge Summary  Katherine [name] QHU:[id number] DOB: 01-Jul-1930 DOA: 02/01/2020  PCP: [name], MD  Admit date: 02/01/2020 Discharge date: 02/05/2020  Recommendations for Outpatient Follow-up:  1. Discharge to home with family supervision and PT/OT 2. Follow up with PCP in 7-10 days. 3. Follow up with neurology as outpatient. 4. Weigh patient daily. Call doctor for weight gain of 3 lbs in one day of 5 lbs or more in one week. 5. Follow up with Dr. Donnetta Hutching with [facility] in 2-4 weeks.   Follow-up Information    [facility] Follow up in 4 week(s).   Specialty: Neurology Why: stroke clinic. office will call with appt date and time Contact information: [address] [place] [phone]             Discharge Diagnoses: Principal diagnosis is #1 1. Dizziness due to BPPV 2. Small Brainstem MRI negative stroke  3. No Vasilar artery stenosis by CTA head and neck 4. Hypertension 5. Chronic diastolic heart failure 6. CAD 7. Proximal Rt ICA stenosis 70% 8. 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 female with medical history significant for prior CVA, essential hypertension, type 2 diabetes, hyperlipidemia, coronary artery disease, chronic diastolic CHF, paroxysmal atrial fibrillation not on oral anticoagulation who presented to [facility] ED due to sudden onset dizziness, associated with difficulty ambulating around 8 AM on 02/01/2020 when she tried to get out of bed.  At baseline she is able to ambulate with a walker.  She endorses history of dizziness but not as severe.  She denies any nausea, vomiting, headaches, fevers, or chills or sick  contacts.  Reports compliance with her medications.  Currently on Plavix and Lipitor which she took the morning of her presentation.  Neurology was consulted by EDP who recommended admission by [facility] for TIA or possible stroke work-up.  ED Course: Persistent mild dizziness at the time of this exam.  MRI brain done on 02/01/2020 showed no acute intracranial abnormality, large old posterior left MCA territory infarct and findings of chronic ischemic microangiopathy.  Please see complete radiology report.  Hospital Course: The patient is a 86 yr old woman who presents to the ED complaining of dizziness in particular when she moves her head. She states that she had sudden onset of dizziness at 8 AM on 02/01/2020 when she went to get out of bed. She admits that she has a history of dizziness, but not as severe. She denies anynausea, vomiting,headaches,fevers,or chills or sick contacts.  The patient has a past medical history significant for CVA, HTN, DM II, hyperlipidemia, CAD, Chronic diastolic CHF, PAF (not on oral anticoagulation). She states that she has been compliant with her medications. At baseline the patient ambulates with a walker.   MRI was performed and demonstrated no acute intracranial abnormality on 02/01/2020.   Neurology was contacted from the ED. They recommended admission for TIA/CVA work up. In particular they have recommended MRA without contrast and MRA neck with and without, an echocardiogram, continue home Plavix, atorvastatin. They have also recommended investigation as to why anticoagulation for the patient's atrial fibrillation had previously been discontinued. They recommend perhaps restarting anticoagulation depending on reason for discontinuation, and replacing Plavix  with ASA 81 mg. They recommend a permissive approach to controlling blood pressures. They also recommend a vestibular evaluation by PT/OT and a speech consult. The stroke team will follow.    [facility] hospitalists  were consulted to admit the patient for further evaluation and treatment.   Dr. Erlinda Hong had a family conference this afternoon to discuss goals of care and the family's desire for invasive procedures for this 86 yr old woman. At the conclusion of this discussion the family had decided to forward with CTA head and neck with prophylactic steroid preparation followed by vascular intervention with stents if advised.  CTA head and neck demonstrated: Approximately 75% diameter stenosis right internal carotid artery proximally due to circumferential calcific stenosis 2. Atherosclerotic calcification left carotid bifurcation without stenosis 3. Both vertebral arteries widely patent to the basilar 4. No intracranial large vessel occlusion. 5. Moderate stenosis right cavernous carotid and mild stenosis left cavernous carotid. 6. Chronic infarct left parietal lobe. No acute intracranial Abnormality.  This patient was discussed in detail with Dr. Tilden Dome. He stated that the patient's dizziness was likely secondary to BPPV. She does have 70% stenosis of the Rt ICA. Dr. Tilden Dome recommends that she follow up with Dr. Donnetta Hutching as outpatient. He has cleared her for discharge to home.  The patient will be discharged to home with home health PT/OT.  Today's assessment: S: The patient is resting comfortably. No new complaints. O: Vitals:  Vitals:   02/05/20 0842 02/05/20 1209  BP: (!) 126/98 (!) 107/55  Pulse: 69 (!) 56  Resp: 16 16  Temp: 97.8 F (36.6 C) 97.6 F (36.4 C)  SpO2: 100% 99%   Exam:  Constitutional:  . The patient is awake, alert, and oriented x 3. No acute distress. Respiratory:  . No increased work of breathing. . No wheezes, rales, or rhonchi . No tactile fremitus Cardiovascular:  . Regular rate and rhythm . No murmurs, ectopy, or gallups. . No lateral PMI. No thrills. Abdomen:  . Abdomen is soft, non-tender, non-distended . No hernias, masses, or organomegaly . Normoactive bowel  sounds.  Musculoskeletal:  . No cyanosis, clubbing, or edema Skin:  . No rashes, lesions, ulcers . palpation of skin: no induration or nodules Neurologic:  . CN 2-12 intact . Sensation all 4 extremities intact Psychiatric:  . Mental status o Mood, affect appropriate o Orientation to person, place, time  . judgment and insight appear intact   Discharge Instructions  Discharge Instructions    Activity as tolerated - No restrictions   Complete by: As directed    Ambulatory referral to Neurology   Complete by: As directed    Follow up in stroke clinic at [facility] with Frann Rider, NP in about 4 weeks. If not available, consider Dr. Antony Contras, Dr. Bess Harvest, or Dr. Sarina Ill.   Call MD for:  difficulty breathing, headache or visual disturbances   Complete by: As directed    Diet - low sodium heart healthy   Complete by: As directed    Diet Carb Modified   Complete by: As directed    Discharge instructions   Complete by: As directed    Discharge to home with family supervision and PT/OT Follow up with PCP in 7-10 days. Follow up with neurology as outpatient. Weigh patient daily. Call doctor for weight gain of 3 lbs in one day of 5 lbs or more in one week.   Increase activity slowly   Complete by: As directed  Allergies as of 02/05/2020      Reactions   Aspirin Hives   Blue Dyes (parenteral) Hives   Ibuprofen Hives   Contrast Media [iodinated Diagnostic Agents] Other (See Comments)   Unknown reaction   Iohexol Other (See Comments)   Unknown reaction   Shellfish-derived Products Hives      Medication List    STOP taking these medications   cephALEXin 250 MG capsule Commonly known as: KEFLEX   fentaNYL 12 MCG/HR Commonly known as: DURAGESIC   metoprolol tartrate 25 MG tablet Commonly known as: LOPRESSOR   nitroGLYCERIN 0.4 MG SL tablet Commonly known as: NITROSTAT   nitroGLYCERIN 0.4 mg/hr patch Commonly known as:  NITRODUR - Dosed in mg/24 hr   potassium chloride SA 20 MEQ tablet Commonly known as: KLOR-CON   torsemide 20 MG tablet Commonly known as: DEMADEX     TAKE these medications   Accu-Chek FastClix Lancets Misc daily.   Accu-Chek Guide test strip Generic drug: glucose blood 1 strip daily.   Accu-Chek Guide w/Device Kit daily.   amLODipine-valsartan 5-320 MG tablet Commonly known as: EXFORGE Take 1 tablet by mouth daily.   atorvastatin 80 MG tablet Commonly known as: LIPITOR Take 1 tablet (80 mg total) by mouth daily. Start taking on: February 06, 2020 What changed:   medication strength  how much to take   bimatoprost 0.01 % Soln Commonly known as: LUMIGAN Place 1 drop into both eyes at bedtime.   brimonidine 0.1 % Soln Commonly known as: ALPHAGAN P Place 1 drop into both eyes 3 (three) times daily.   clopidogrel 75 MG tablet Commonly known as: PLAVIX Take 1 tablet (75 mg total) by mouth daily.   dicyclomine 10 MG capsule Commonly known as: BENTYL Take 10 mg by mouth 3 (three) times daily.   dorzolamide 2 % ophthalmic solution Commonly known as: TRUSOPT Place 1 drop into both eyes 3 (three) times daily.   ergocalciferol 1.25 MG (50000 UT) capsule Commonly known as: VITAMIN D2 Take 50,000 Units by mouth every Saturday.   Febuxostat 80 MG Tabs Take 80 mg by mouth daily.   ferrous gluconate 324 MG tablet Commonly known as: FERGON Take 324 mg by mouth daily.   insulin aspart protamine- aspart (70-30) 100 UNIT/ML injection Commonly known as: NOVOLOG MIX 70/30 Inject 0.2 mLs (20 Units total) into the skin 2 (two) times daily with a meal. Inject 20 units subcutaneously every morning and 10 units at night What changed:   how much to take  when to take this  additional instructions   lidocaine 5 % Commonly known as: LIDODERM Place 1 patch onto the skin daily. Remove & Discard patch within 12 hours or as directed by MD Start taking on: February 06, 2020   omeprazole 40 MG capsule Commonly known as: PRILOSEC Take 1 capsule (40 mg total) by mouth daily.      Allergies  Allergen Reactions  . Aspirin Hives  . Blue Dyes (Parenteral) Hives  . Ibuprofen Hives  . Contrast Media [Iodinated Diagnostic Agents] Other (See Comments)    Unknown reaction  . Iohexol Other (See Comments)    Unknown reaction  . Shellfish-Derived Products Hives    The results of significant diagnostics from this hospitalization (including imaging, microbiology, ancillary and laboratory) are listed below for reference.    Significant Diagnostic Studies: CT ANGIO HEAD W OR WO CONTRAST  Result Date: 02/04/2020 CLINICAL DATA:  Stroke. EXAM: CT ANGIOGRAPHY HEAD AND NECK TECHNIQUE: Multidetector CT imaging of  the head and neck was performed using the standard protocol during bolus administration of intravenous contrast. Multiplanar CT image reconstructions and MIPs were obtained to evaluate the vascular anatomy. Carotid stenosis measurements (when applicable) are obtained utilizing NASCET criteria, using the distal internal carotid diameter as the denominator. CONTRAST:  54m OMNIPAQUE IOHEXOL 350 MG/ML SOLN COMPARISON:  CT head 02/01/2020, MRI 02/01/2020 FINDINGS: CT HEAD FINDINGS Brain: Moderately large area of chronic infarct in the left parietal lobe unchanged. Patchy white matter hypodensity bilaterally unchanged. Ventricle size normal. Negative for acute infarct, hemorrhage, mass. Vascular: Negative for hyperdense vessel. Skull: Negative Sinuses: Mild mucosal edema paranasal sinuses. Orbits: Bilateral cataract extraction. Review of the MIP images confirms the above findings CTA NECK FINDINGS Aortic arch: Atherosclerotic calcification aortic arch without aneurysm. Bovine branching arch. Proximal great vessels patent without stenosis. Right carotid system: Atherosclerotic calcification right carotid bifurcation. Approximately 75% diameter stenosis proximal right internal  carotid artery due to circumferential calcific plaque. Left carotid system: Atherosclerotic calcification left carotid bifurcation without significant stenosis. Vertebral arteries: Both vertebral arteries patent to the basilar without stenosis. Skeleton: Mild cervical degenerative change. No acute skeletal abnormality. Other neck: Negative for mass or adenopathy. Upper chest: Lung apices clear bilaterally. Review of the MIP images confirms the above findings CTA HEAD FINDINGS Anterior circulation: Atherosclerotic calcification in the cavernous carotid bilaterally. Moderate stenosis on the right and mild stenosis on the left. Anterior and middle cerebral arteries patent bilaterally. No flow-limiting stenosis or large vessel occlusion. Posterior circulation: Both vertebral arteries patent to the basilar. Left PICA patent. Right PICA not visualized. Basilar widely patent. Superior cerebellar and posterior cerebral arteries patent bilaterally without stenosis or large vessel occlusion. Venous sinuses: Normal venous enhancement Anatomic variants: None Review of the MIP images confirms the above findings IMPRESSION: 1. Approximately 75% diameter stenosis right internal carotid artery proximally due to circumferential calcific stenosis 2. Atherosclerotic calcification left carotid bifurcation without stenosis 3. Both vertebral arteries widely patent to the basilar 4. No intracranial large vessel occlusion. 5. Moderate stenosis right cavernous carotid and mild stenosis left cavernous carotid. 6. Chronic infarct left parietal lobe. No acute intracranial abnormality. Electronically Signed   By: CFranchot GalloM.D.   On: 02/04/2020 08:51   CT Head Wo Contrast  Result Date: 02/01/2020 CLINICAL DATA:  Dizziness, sudden onset beginning this morning. History of strokes. Patient takes Plavix. EXAM: CT HEAD WITHOUT CONTRAST TECHNIQUE: Contiguous axial images were obtained from the base of the skull through the vertex without  intravenous contrast. COMPARISON:  11/23/2004 FINDINGS: Brain: No evidence of acute infarction, hemorrhage, hydrocephalus, extra-axial collection or mass lesion/mass effect. Old left posterior MCA distribution infarct, stable. Vascular: No hyperdense vessel or unexpected calcification. Skull: Normal. Negative for fracture or focal lesion. Sinuses/Orbits: Globes and orbits are unremarkable. Sinuses and mastoid air cells are clear. Other: None. IMPRESSION: 1. No acute intracranial abnormalities. 2. Old left posterior MCA distribution infarct. Electronically Signed   By: DLajean ManesM.D.   On: 02/01/2020 17:36   CT ANGIO NECK W OR WO CONTRAST  Result Date: 02/04/2020 CLINICAL DATA:  Stroke. EXAM: CT ANGIOGRAPHY HEAD AND NECK TECHNIQUE: Multidetector CT imaging of the head and neck was performed using the standard protocol during bolus administration of intravenous contrast. Multiplanar CT image reconstructions and MIPs were obtained to evaluate the vascular anatomy. Carotid stenosis measurements (when applicable) are obtained utilizing NASCET criteria, using the distal internal carotid diameter as the denominator. CONTRAST:  770mOMNIPAQUE IOHEXOL 350 MG/ML SOLN COMPARISON:  CT head 02/01/2020, MRI  02/01/2020 FINDINGS: CT HEAD FINDINGS Brain: Moderately large area of chronic infarct in the left parietal lobe unchanged. Patchy white matter hypodensity bilaterally unchanged. Ventricle size normal. Negative for acute infarct, hemorrhage, mass. Vascular: Negative for hyperdense vessel. Skull: Negative Sinuses: Mild mucosal edema paranasal sinuses. Orbits: Bilateral cataract extraction. Review of the MIP images confirms the above findings CTA NECK FINDINGS Aortic arch: Atherosclerotic calcification aortic arch without aneurysm. Bovine branching arch. Proximal great vessels patent without stenosis. Right carotid system: Atherosclerotic calcification right carotid bifurcation. Approximately 75% diameter stenosis  proximal right internal carotid artery due to circumferential calcific plaque. Left carotid system: Atherosclerotic calcification left carotid bifurcation without significant stenosis. Vertebral arteries: Both vertebral arteries patent to the basilar without stenosis. Skeleton: Mild cervical degenerative change. No acute skeletal abnormality. Other neck: Negative for mass or adenopathy. Upper chest: Lung apices clear bilaterally. Review of the MIP images confirms the above findings CTA HEAD FINDINGS Anterior circulation: Atherosclerotic calcification in the cavernous carotid bilaterally. Moderate stenosis on the right and mild stenosis on the left. Anterior and middle cerebral arteries patent bilaterally. No flow-limiting stenosis or large vessel occlusion. Posterior circulation: Both vertebral arteries patent to the basilar. Left PICA patent. Right PICA not visualized. Basilar widely patent. Superior cerebellar and posterior cerebral arteries patent bilaterally without stenosis or large vessel occlusion. Venous sinuses: Normal venous enhancement Anatomic variants: None Review of the MIP images confirms the above findings IMPRESSION: 1. Approximately 75% diameter stenosis right internal carotid artery proximally due to circumferential calcific stenosis 2. Atherosclerotic calcification left carotid bifurcation without stenosis 3. Both vertebral arteries widely patent to the basilar 4. No intracranial large vessel occlusion. 5. Moderate stenosis right cavernous carotid and mild stenosis left cavernous carotid. 6. Chronic infarct left parietal lobe. No acute intracranial abnormality. Electronically Signed   By: Franchot Gallo M.D.   On: 02/04/2020 08:51   MR ANGIO HEAD WO CONTRAST  Result Date: 02/01/2020 CLINICAL DATA:  Sudden onset dizziness EXAM: MR HEAD WITHOUT CONTRAST MR CIRCLE OF WILLIS WITHOUT CONTRAST MRA OF THE NECK WITHOUT AND WITH CONTRAST TECHNIQUE: Multiplanar, multiecho pulse sequences of the brain,  circle of willis and surrounding structures were obtained without intravenous contrast. Angiographic images of the neck were obtained using MRA technique without and with intravenous contrast. CONTRAST:  8.58m GADAVIST GADOBUTROL 1 MMOL/ML IV SOLN COMPARISON:  None. FINDINGS: MR HEAD FINDINGS Brain: No acute infarct, acute hemorrhage or extra-axial collection. There is a large, old posterior left MCA territory infarct. Early confluent hyperintense T2-weighted signal of the periventricular and deep white matter, most commonly due to chronic ischemic microangiopathy. Generalized volume loss. No chronic microhemorrhage. There is a focus of hyperintense T1-weighted signal within the pituitary gland (series 10, image 12). Vascular: Major flow voids are preserved. Skull and upper cervical spine: Normal calvarium and skull base. Visualized upper cervical spine and soft tissues are normal. Sinuses/Orbits:No paranasal sinus fluid levels or advanced mucosal thickening. No mastoid or middle ear effusion. Normal orbits. MR CIRCLE OF WILLIS FINDINGS POSTERIOR CIRCULATION: --Vertebral arteries: Normal --Inferior cerebellar arteries: Left PICA is normal. Right PICA is not clearly visualized. --Basilar artery: Moderate-to-severe stenosis of the distal basilar artery. --Superior cerebellar arteries: Normal. --Posterior cerebral arteries: Normal. ANTERIOR CIRCULATION: --Intracranial internal carotid arteries: Normal. --Anterior cerebral arteries (ACA): Normal. --Middle cerebral arteries (MCA): Normal. ANATOMIC VARIANTS: None MRA NECK FINDINGS Vertebral arteries are codominant. They are both normal to the vertebrobasilar confluence. There is approximately 60% stenosis of the proximal right internal carotid artery. Normal left carotid system. Normal 3 vessel  aortic branching pattern. IMPRESSION: 1. No acute intracranial abnormality. 2. Large, old posterior left MCA territory infarct and findings of chronic ischemic microangiopathy. 3.  No emergent large vessel occlusion. 4. Moderate-to-severe stenosis of the distal basilar artery. 5. Approximately 60% stenosis of the proximal right internal carotid artery. 6. Focus of hyperintense T1-weighted signal within the pituitary gland, which may be a proteinaceous cyst. Electronically Signed   By: Ulyses Jarred M.D.   On: 02/01/2020 23:01   MR ANGIO NECK W WO CONTRAST  Result Date: 02/01/2020 CLINICAL DATA:  Sudden onset dizziness EXAM: MR HEAD WITHOUT CONTRAST MR CIRCLE OF WILLIS WITHOUT CONTRAST MRA OF THE NECK WITHOUT AND WITH CONTRAST TECHNIQUE: Multiplanar, multiecho pulse sequences of the brain, circle of willis and surrounding structures were obtained without intravenous contrast. Angiographic images of the neck were obtained using MRA technique without and with intravenous contrast. CONTRAST:  8.22m GADAVIST GADOBUTROL 1 MMOL/ML IV SOLN COMPARISON:  None. FINDINGS: MR HEAD FINDINGS Brain: No acute infarct, acute hemorrhage or extra-axial collection. There is a large, old posterior left MCA territory infarct. Early confluent hyperintense T2-weighted signal of the periventricular and deep white matter, most commonly due to chronic ischemic microangiopathy. Generalized volume loss. No chronic microhemorrhage. There is a focus of hyperintense T1-weighted signal within the pituitary gland (series 10, image 12). Vascular: Major flow voids are preserved. Skull and upper cervical spine: Normal calvarium and skull base. Visualized upper cervical spine and soft tissues are normal. Sinuses/Orbits:No paranasal sinus fluid levels or advanced mucosal thickening. No mastoid or middle ear effusion. Normal orbits. MR CIRCLE OF WILLIS FINDINGS POSTERIOR CIRCULATION: --Vertebral arteries: Normal --Inferior cerebellar arteries: Left PICA is normal. Right PICA is not clearly visualized. --Basilar artery: Moderate-to-severe stenosis of the distal basilar artery. --Superior cerebellar arteries: Normal. --Posterior  cerebral arteries: Normal. ANTERIOR CIRCULATION: --Intracranial internal carotid arteries: Normal. --Anterior cerebral arteries (ACA): Normal. --Middle cerebral arteries (MCA): Normal. ANATOMIC VARIANTS: None MRA NECK FINDINGS Vertebral arteries are codominant. They are both normal to the vertebrobasilar confluence. There is approximately 60% stenosis of the proximal right internal carotid artery. Normal left carotid system. Normal 3 vessel aortic branching pattern. IMPRESSION: 1. No acute intracranial abnormality. 2. Large, old posterior left MCA territory infarct and findings of chronic ischemic microangiopathy. 3. No emergent large vessel occlusion. 4. Moderate-to-severe stenosis of the distal basilar artery. 5. Approximately 60% stenosis of the proximal right internal carotid artery. 6. Focus of hyperintense T1-weighted signal within the pituitary gland, which may be a proteinaceous cyst. Electronically Signed   By: KUlyses JarredM.D.   On: 02/01/2020 23:01   MR BRAIN WO CONTRAST  Result Date: 02/01/2020 CLINICAL DATA:  Sudden onset dizziness EXAM: MR HEAD WITHOUT CONTRAST MR CIRCLE OF WILLIS WITHOUT CONTRAST MRA OF THE NECK WITHOUT AND WITH CONTRAST TECHNIQUE: Multiplanar, multiecho pulse sequences of the brain, circle of willis and surrounding structures were obtained without intravenous contrast. Angiographic images of the neck were obtained using MRA technique without and with intravenous contrast. CONTRAST:  8.52mGADAVIST GADOBUTROL 1 MMOL/ML IV SOLN COMPARISON:  None. FINDINGS: MR HEAD FINDINGS Brain: No acute infarct, acute hemorrhage or extra-axial collection. There is a large, old posterior left MCA territory infarct. Early confluent hyperintense T2-weighted signal of the periventricular and deep white matter, most commonly due to chronic ischemic microangiopathy. Generalized volume loss. No chronic microhemorrhage. There is a focus of hyperintense T1-weighted signal within the pituitary gland  (series 10, image 12). Vascular: Major flow voids are preserved. Skull and upper cervical spine: Normal calvarium  and skull base. Visualized upper cervical spine and soft tissues are normal. Sinuses/Orbits:No paranasal sinus fluid levels or advanced mucosal thickening. No mastoid or middle ear effusion. Normal orbits. MR CIRCLE OF WILLIS FINDINGS POSTERIOR CIRCULATION: --Vertebral arteries: Normal --Inferior cerebellar arteries: Left PICA is normal. Right PICA is not clearly visualized. --Basilar artery: Moderate-to-severe stenosis of the distal basilar artery. --Superior cerebellar arteries: Normal. --Posterior cerebral arteries: Normal. ANTERIOR CIRCULATION: --Intracranial internal carotid arteries: Normal. --Anterior cerebral arteries (ACA): Normal. --Middle cerebral arteries (MCA): Normal. ANATOMIC VARIANTS: None MRA NECK FINDINGS Vertebral arteries are codominant. They are both normal to the vertebrobasilar confluence. There is approximately 60% stenosis of the proximal right internal carotid artery. Normal left carotid system. Normal 3 vessel aortic branching pattern. IMPRESSION: 1. No acute intracranial abnormality. 2. Large, old posterior left MCA territory infarct and findings of chronic ischemic microangiopathy. 3. No emergent large vessel occlusion. 4. Moderate-to-severe stenosis of the distal basilar artery. 5. Approximately 60% stenosis of the proximal right internal carotid artery. 6. Focus of hyperintense T1-weighted signal within the pituitary gland, which may be a proteinaceous cyst. Electronically Signed   By: Ulyses Jarred M.D.   On: 02/01/2020 23:01   DG Shoulder Left  Result Date: 02/02/2020 CLINICAL DATA:  Left shoulder pain EXAM: LEFT SHOULDER - 2+ VIEW COMPARISON:  None. FINDINGS: Degenerative changes of the acromioclavicular and glenohumeral joints are seen. No fracture or dislocation is noted. No soft tissue abnormality is seen. Underlying bony thorax is within normal limits.  IMPRESSION: Degenerative change without acute abnormality. Electronically Signed   By: Inez Catalina M.D.   On: 02/02/2020 01:56   ECHOCARDIOGRAM COMPLETE BUBBLE STUDY  Result Date: 02/02/2020    ECHOCARDIOGRAM REPORT   Patient Name:   AVEA MCGOWEN Date of Exam: 02/02/2020 Medical Rec #:  [id number]     Height:       67.0 in Accession #:    [id number]    Weight:       189.0 lb Date of Birth:  Jun 19, 1930    BSA:          1.974 m Patient Age:    73 years      BP:           140/62 mmHg Patient Gender: F             HR:           61 bpm. Exam Location:  Inpatient Procedure: 2D Echo, Color Doppler, Cardiac Doppler, Saline Contrast Bubble Study            and Intracardiac Opacification Agent Indications:    TIA (transient ischemic attack) 435.9 / G45.9  History:        Patient has no prior history of Echocardiogram examinations.                 CHF, CAD, Stroke; Risk Factors:Diabetes, Dyslipidemia and                 Hypertension. Sudden onset dizziness, associated with difficulty                 ambulating around. Chronic kidney disease.  Sonographer:    Darlina Sicilian RDCS Referring Phys: [id number] [place]  1. Technically difficult; suggest TEE if clinically indicated; negative saline microcavitation study.  2. Left ventricular ejection fraction, by estimation, is >75%. The left ventricle has hyperdynamic function. The left ventricle has no regional wall motion abnormalities. There is mild left ventricular hypertrophy of the basal-septal  segment. Left ventricular diastolic parameters are consistent with Grade I diastolic dysfunction (impaired relaxation).  3. Right ventricular systolic function is normal. The right ventricular size is normal. Tricuspid regurgitation signal is inadequate for assessing PA pressure.  4. The mitral valve is normal in structure. No evidence of mitral valve regurgitation. No evidence of mitral stenosis.  5. The aortic valve has an indeterminant number of cusps. Aortic valve  regurgitation is not visualized. Mild to moderate aortic valve stenosis.  6. The inferior vena cava is normal in size with greater than 50% respiratory variability, suggesting right atrial pressure of 3 mmHg. FINDINGS  Left Ventricle: Left ventricular ejection fraction, by estimation, is >75%. The left ventricle has hyperdynamic function. The left ventricle has no regional wall motion abnormalities. Definity contrast agent was given IV to delineate the left ventricular endocardial borders. The left ventricular internal cavity size was normal in size. There is mild left ventricular hypertrophy of the basal-septal segment. Left ventricular diastolic parameters are consistent with Grade I diastolic dysfunction  (impaired relaxation). Right Ventricle: The right ventricular size is normal. No increase in right ventricular wall thickness. Right ventricular systolic function is normal. Tricuspid regurgitation signal is inadequate for assessing PA pressure. The tricuspid regurgitant velocity is 2.14 m/s, and with an assumed right atrial pressure of 8 mmHg, the estimated right ventricular systolic pressure is 90.3 mmHg. Left Atrium: Left atrial size was normal in size. Right Atrium: Right atrial size was normal in size. Pericardium: There is no evidence of pericardial effusion. Mitral Valve: The mitral valve is normal in structure. Mild mitral annular calcification. No evidence of mitral valve regurgitation. No evidence of mitral valve stenosis. Tricuspid Valve: The tricuspid valve is normal in structure. Tricuspid valve regurgitation is mild . No evidence of tricuspid stenosis. Aortic Valve: The aortic valve has an indeterminant number of cusps. Aortic valve regurgitation is not visualized. Mild to moderate aortic stenosis is present. Aortic valve mean gradient measures 12.2 mmHg. Aortic valve peak gradient measures 23.5 mmHg. Aortic valve area, by VTI measures 1.35 cm. Pulmonic Valve: The pulmonic valve was not well  visualized. Pulmonic valve regurgitation is not visualized. No evidence of pulmonic stenosis. Aorta: The aortic root is normal in size and structure. Venous: The inferior vena cava is normal in size with greater than 50% respiratory variability, suggesting right atrial pressure of 3 mmHg. IAS/Shunts: Agitated saline contrast was given intravenously to evaluate for intracardiac shunting. Additional Comments: Technically difficult; suggest TEE if clinically indicated; negative saline microcavitation study.  LEFT VENTRICLE PLAX 2D LVIDd:         4.10 cm     Diastology LVIDs:         1.60 cm     LV e' medial:    7.07 cm/s LV PW:         0.90 cm     LV E/e' medial:  9.9 LV IVS:        0.90 cm     LV e' lateral:   7.94 cm/s LVOT diam:     1.90 cm     LV E/e' lateral: 8.8 LV SV:         70 LV SV Index:   35 LVOT Area:     2.84 cm  LV Volumes (MOD) LV vol d, MOD A2C: 96.6 ml LV vol d, MOD A4C: 58.5 ml LV vol s, MOD A2C: 29.9 ml LV vol s, MOD A4C: 26.5 ml LV SV MOD A2C:     66.7 ml LV  SV MOD A4C:     58.5 ml LV SV MOD BP:      50.8 ml RIGHT VENTRICLE RV S prime:     10.90 cm/s TAPSE (M-mode): 2.5 cm LEFT ATRIUM             Index LA diam:        3.20 cm 1.62 cm/m LA Vol (A2C):   57.2 ml 28.97 ml/m LA Vol (A4C):   24.4 ml 12.36 ml/m LA Biplane Vol: 38.0 ml 19.25 ml/m  AORTIC VALVE AV Area (Vmax):    1.26 cm AV Area (Vmean):   1.32 cm AV Area (VTI):     1.35 cm AV Vmax:           242.60 cm/s AV Vmean:          160.800 cm/s AV VTI:            0.520 m AV Peak Grad:      23.5 mmHg AV Mean Grad:      12.2 mmHg LVOT Vmax:         107.50 cm/s LVOT Vmean:        74.700 cm/s LVOT VTI:          0.247 m LVOT/AV VTI ratio: 0.48  AORTA Ao Root diam: 3.00 cm Ao Asc diam:  3.50 cm MITRAL VALVE                TRICUSPID VALVE MV Area (PHT): 2.37 cm     TR Peak grad:   18.3 mmHg MV Decel Time: 320 msec     TR Vmax:        214.00 cm/s MV E velocity: 70.00 cm/s MV A velocity: 103.00 cm/s  SHUNTS MV E/A ratio:  0.68         Systemic VTI:   0.25 m                             Systemic Diam: 1.90 cm Kirk Ruths MD Electronically signed by Kirk Ruths MD Signature Date/Time: 02/02/2020/1:48:06 PM    Final     Microbiology: Recent Results (from the past 240 hour(s))  Culture, Urine     Status: Abnormal   Collection Time: 02/02/20  3:40 AM   Specimen: Urine, Clean Catch  Result Value Ref Range Status   Specimen Description URINE, CLEAN CATCH  Final   Special Requests   Final    NONE Performed at [facility], [address], [place], [place] [address]    Culture >=100,000 COLONIES/mL CITROBACTER FREUNDII (A)  Final   Report Status 02/04/2020 FINAL  Final   Organism ID, Bacteria CITROBACTER FREUNDII (A)  Final      Susceptibility   Citrobacter freundii - MIC*    CEFAZOLIN >=64 RESISTANT Resistant     CEFEPIME 0.5 SENSITIVE Sensitive     CEFTRIAXONE >=64 RESISTANT Resistant     CIPROFLOXACIN <=0.25 SENSITIVE Sensitive     GENTAMICIN <=1 SENSITIVE Sensitive     IMIPENEM 0.5 SENSITIVE Sensitive     NITROFURANTOIN <=16 SENSITIVE Sensitive     TRIMETH/SULFA <=20 SENSITIVE Sensitive     PIP/TAZO 64 INTERMEDIATE Intermediate     * >=100,000 COLONIES/mL CITROBACTER FREUNDII  Resp Panel by RT-PCR (Flu A&B, Covid) Nasopharyngeal Swab     Status: None   Collection Time: 02/02/20  8:18 AM   Specimen: Nasopharyngeal Swab; Nasopharyngeal(NP) swabs in vial transport medium  Result Value Ref Range  Status   SARS Coronavirus 2 by RT PCR NEGATIVE NEGATIVE Final    Comment: (NOTE) SARS-CoV-2 target nucleic acids are NOT DETECTED.  The SARS-CoV-2 RNA is generally detectable in upper respiratory specimens during the acute phase of infection. The lowest concentration of SARS-CoV-2 viral copies this assay can detect is 138 copies/mL. A negative result does not preclude SARS-Cov-2 infection and should not be used as the sole basis for treatment or other patient management decisions. A negative result may occur with  improper  specimen collection/handling, submission of specimen other than nasopharyngeal swab, presence of viral mutation(s) within the areas targeted by this assay, and inadequate number of viral copies(<138 copies/mL). A negative result must be combined with clinical observations, patient history, and epidemiological information. The expected result is Negative.  Fact Sheet for Patients:  EntrepreneurPulse.com.au  Fact Sheet for Healthcare Providers:  IncredibleEmployment.be  This test is no t yet approved or cleared by the [place] FDA and  has been authorized for detection and/or diagnosis of SARS-CoV-2 by FDA under an Emergency Use Authorization (EUA). This EUA will remain  in effect (meaning this test can be used) for the duration of the COVID-19 declaration under Section 564(b)(1) of the Act, 21 U.S.C.section 360bbb-3(b)(1), unless the authorization is terminated  or revoked sooner.       Influenza A by PCR NEGATIVE NEGATIVE Final   Influenza B by PCR NEGATIVE NEGATIVE Final    Comment: (NOTE) The Xpert Xpress SARS-CoV-2/FLU/RSV plus assay is intended as an aid in the diagnosis of influenza from Nasopharyngeal swab specimens and should not be used as a sole basis for treatment. Nasal washings and aspirates are unacceptable for Xpert Xpress SARS-CoV-2/FLU/RSV testing.  Fact Sheet for Patients: EntrepreneurPulse.com.au  Fact Sheet for Healthcare Providers: IncredibleEmployment.be  This test is not yet approved or cleared by the [place] FDA and has been authorized for detection and/or diagnosis of SARS-CoV-2 by FDA under an Emergency Use Authorization (EUA). This EUA will remain in effect (meaning this test can be used) for the duration of the COVID-19 declaration under Section 564(b)(1) of the Act, 21 U.S.C. section 360bbb-3(b)(1), unless the authorization is terminated or revoked.  Performed at  [facility], [place] [address], [place], [place] [address]      Labs: Basic Metabolic Panel: Recent Labs  Lab 02/01/20 1643 02/02/20 0720 02/03/20 0244 02/04/20 0223 02/05/20 0459  NA 141 142 140 138 139  K 5.2* 4.6 4.2 4.6 4.0  CL 104 105 109 105 108  CO2 '23 23 23 ' 21* 21*  GLUCOSE 158* 125* 172* 285* 252*  BUN '22 17 18 17 20  ' CREATININE 1.18* 1.04* 1.11* 0.88 1.02*  CALCIUM 9.7 9.5 9.2 9.8 9.2  MG 2.3  --   --   --   --   PHOS  --  2.8 2.7 1.5* 2.3*   Liver Function Tests: Recent Labs  Lab 02/02/20 0720 02/03/20 0244 02/04/20 0223 02/05/20 0459  ALBUMIN 3.3* 3.2* 3.3* 3.1*   No results for input(s): LIPASE, AMYLASE in the last 168 hours. No results for input(s): AMMONIA in the last 168 hours. CBC: Recent Labs  Lab 02/01/20 1643 02/02/20 0720 02/03/20 0244 02/04/20 0223  WBC 5.5 5.2 4.9 3.9*  NEUTROABS 2.1  --   --   --   HGB 12.2 12.3 11.6* 12.6  HCT 40.3 40.4 36.8 38.3  MCV 91.8 92.7 88.5 87.2  PLT 227 207 205 214   Cardiac Enzymes: No results for input(s): CKTOTAL, CKMB,  CKMBINDEX, TROPONINI in the last 168 hours. BNP: BNP (last 3 results) No results for input(s): BNP in the last 8760 hours.  ProBNP (last 3 results) No results for input(s): PROBNP in the last 8760 hours.  CBG: Recent Labs  Lab 02/04/20 1609 02/04/20 2139 02/05/20 0558 02/05/20 0836 02/05/20 1101  GLUCAP 345* 339* 237* 370* 320*    Principal Problem:   Dizziness Active Problems:   Arterial ischemic stroke, vertebrobasilar, brainstem, acute (HCC)   Essential hypertension   Cerebrovascular disease   Mass of urinary bladder   Hematuria   CAD (coronary artery disease)   Chronic diastolic CHF (congestive heart failure) ([place])   Time coordinating discharge: 38 minutes.  Signed:        Nachmen Mansel, DO [facility]  02/05/2020, 3:44 PM

## 2020-03-12 ENCOUNTER — Ambulatory Visit (INDEPENDENT_AMBULATORY_CARE_PROVIDER_SITE_OTHER): Payer: Medicare Other | Admitting: Adult Health

## 2020-03-12 ENCOUNTER — Encounter: Payer: Self-pay | Admitting: Adult Health

## 2020-03-12 VITALS — BP 158/80 | HR 62 | Ht 67.0 in | Wt 193.8 lb

## 2020-03-12 DIAGNOSIS — I6523 Occlusion and stenosis of bilateral carotid arteries: Secondary | ICD-10-CM

## 2020-03-12 DIAGNOSIS — R319 Hematuria, unspecified: Secondary | ICD-10-CM

## 2020-03-12 DIAGNOSIS — E1165 Type 2 diabetes mellitus with hyperglycemia: Secondary | ICD-10-CM

## 2020-03-12 DIAGNOSIS — Z8673 Personal history of transient ischemic attack (TIA), and cerebral infarction without residual deficits: Secondary | ICD-10-CM

## 2020-03-12 DIAGNOSIS — E785 Hyperlipidemia, unspecified: Secondary | ICD-10-CM

## 2020-03-12 DIAGNOSIS — I10 Essential (primary) hypertension: Secondary | ICD-10-CM | POA: Diagnosis not present

## 2020-03-12 DIAGNOSIS — H8110 Benign paroxysmal vertigo, unspecified ear: Secondary | ICD-10-CM | POA: Diagnosis not present

## 2020-03-12 DIAGNOSIS — Z794 Long term (current) use of insulin: Secondary | ICD-10-CM

## 2020-03-12 NOTE — Patient Instructions (Signed)
Continue working with home health therapies for hopeful ongoing improvement. Once completed, may consider additional outpatient therapy if needed  Please ensure follow up with your urologist regarding episodes of blood in your urine and ongoing use of plavix. plesae do not stop plavix long term   Continue clopidogrel 75 mg daily  and atorvastatin 80 mg daily for secondary stroke prevention  Continue to follow up with PCP regarding cholesterol, blood pressure and diabetes management  Maintain strict control of hypertension with blood pressure goal below 130/90, diabetes with hemoglobin A1c goal below 7.0 % and cholesterol with LDL cholesterol (bad cholesterol) goal below 70 mg/dL.       Followup in the future with me in 3 months or call earlier if needed       Thank you for coming to see Katherine [name] at [facility]. I hope we have been able to provide you high quality care today.  You may receive a patient satisfaction survey over the next few weeks. We would appreciate your feedback and comments so that we may continue to improve ourselves and the health of our patients.    Benign Positional Vertigo Vertigo is the feeling that you or your surroundings are moving when they are not. Benign positional vertigo is the most common form of vertigo. This is usually a harmless condition (benign). This condition is positional. This means that symptoms are triggered by certain movements and positions. This condition can be dangerous if it occurs while you are doing something that could cause harm to you or others. This includes activities such as driving or operating machinery. What are the causes? The inner ear has fluid-filled canals that help your brain sense movement and balance. When the fluid moves, the brain receives messages about your body's position. With benign positional vertigo, crystals in the inner ear break free and disturb the inner ear area. This causes your brain to  receive confusing messages about your body's position. What increases the risk? You are more likely to develop this condition if:  You are a woman.  You are 19 years of age or older.  You have recently had a head injury.  You have an inner ear disease. What are the signs or symptoms? Symptoms of this condition usually happen when you move your head or your eyes in different directions. Symptoms may start suddenly, and usually last for less than a minute. They include:  Loss of balance and falling.  Feeling like you are spinning or moving.  Feeling like your surroundings are spinning or moving.  Nausea and vomiting.  Blurred vision.  Dizziness.  Involuntary eye movement (nystagmus). Symptoms can be mild and cause only minor problems, or they can be severe and interfere with daily life. Episodes of benign positional vertigo may return (recur) over time. Symptoms may improve over time. How is this diagnosed? This condition may be diagnosed based on:  Your medical history.  Physical exam of the head, neck, and ears.  Positional tests to check for or stimulate vertigo. You may be asked to turn your head and change positions, such as going from sitting to lying down. A health care provider will watch for symptoms of vertigo. You may be referred to a health care provider who specializes in ear, nose, and throat problems (ENT, or otolaryngologist) or a provider who specializes in disorders of the nervous system (neurologist). How is this treated? This condition may be treated in a session in which your health care provider moves your head  in specific positions to help the displaced crystals in your inner ear move. Treatment for this condition may take several sessions. Surgery may be needed in severe cases, but this is rare. In some cases, benign positional vertigo may resolve on its own in 2-4 weeks.   Follow these instructions at home: Safety  Move slowly. Avoid sudden body or  head movements or certain positions, as told by your health care provider.  Avoid driving until your health care provider says it is safe for you to do so.  Avoid operating heavy machinery until your health care provider says it is safe for you to do so.  Avoid doing any tasks that would be dangerous to you or others if vertigo occurs.  If you have trouble walking or keeping your balance, try using a cane for stability. If you feel dizzy or unstable, sit down right away.  Return to your normal activities as told by your health care provider. Ask your health care provider what activities are safe for you. General instructions  Take over-the-counter and prescription medicines only as told by your health care provider.  Drink enough fluid to keep your urine pale yellow.  Keep all follow-up visits as told by your health care provider. This is important. Contact a health care provider if:  You have a fever.  Your condition gets worse or you develop new symptoms.  Your family or friends notice any behavioral changes.  You have nausea or vomiting that gets worse.  You have numbness or a prickling and tingling sensation. Get help right away if you:  Have difficulty speaking or moving.  Are always dizzy.  Faint.  Develop severe headaches.  Have weakness in your legs or arms.  Have changes in your hearing or vision.  Develop a stiff neck.  Develop sensitivity to light. Summary  Vertigo is the feeling that you or your surroundings are moving when they are not. Benign positional vertigo is the most common form of vertigo.  This condition is caused by crystals in the inner ear that become displaced. This causes a disturbance in an area of the inner ear that helps your brain sense movement and balance.  Symptoms include loss of balance and falling, feeling that you or your surroundings are moving, nausea and vomiting, and blurred vision.  This condition can be diagnosed based  on symptoms, a physical exam, and positional tests.  Follow safety instructions as told by your health care provider. You will also be told when to contact your health care provider in case of problems. This information is not intended to replace advice given to you by your health care provider. Make sure you discuss any questions you have with your health care provider. Document Revised: 01/11/2019 Document Reviewed: 07/29/2017 Elsevier Patient Education  2021 [employer].

## 2020-03-12 NOTE — Progress Notes (Signed)
[facility] [address] [place]. [place] [address] [phone]       HOSPITAL FOLLOW UP NOTE  Ms. Katherine [name] Date of Birth:  1930-04-27 Medical Record Number:  [id number]   Reason for Referral:  hospital stroke follow up    SUBJECTIVE:   CHIEF COMPLAINT:  Chief Complaint  Patient presents with  . Follow-up    RM 14 with daughter (denise) Pt is well, dizziness is slightly better.    HPI:   Katherine [name] is a 87 y.o. female with PMHx of type 2 diabetes, hypertension, hyperlipidemia,obesity,coronary artery disease, congestive heart failure, aortic stenosis, chronic kidney disease, former tobacco use, and prior stroke (posterior R MCA territory with residual hemianopia, mild right sided weakness and mild aphasia) with bladder bx 01/24/2020 off plavix since 11/18 who presented on 02/01/2020 with dizziness. Personally reviewed hospitalization pertinent progress notes, lab work and imaging with summary provided. MRI negative for acute infarct but symptoms possibly related to peripheral vestibular dysfunction given prior history of positional vertigo. MRI did show old large L MCA infarct. Concern for BA stenosis as evidenced on MRA head but not on MRA neck therefore proceeded with CT head/neck which did not show evidence of BA stenosis.  History of carotid stenosis s/p L CEA 2003 recurrent asymptomatic right ICA 60% stenosis and followed routinely by Dr. Donnetta Hutching [facility] outpatient.  Recommended restart of Plavix 75 mg daily with history of aspirin allergy.  History of HTN initially elevated and stabilized with long-term BP goal 130-150 given BA stenosis.  History of HLD on atorvastatin 40 mg daily with LDL 165 and increased atorvastatin from 40 mg to 80 mg daily.  Uncontrolled DM with A1c 7.8.  Other stroke risk factors include advanced age, former tobacco use, obesity, CAD, CHF and aortic stenosis.  Transient vertigo possibly from peripheral vestibular dysfunction given  prior history of positional vertigo.  CT head No acute abnormality. Old L posterior MCA infarct.   MRI  No acute abnormality. Old large L MCA infarct.   MRA head & neck no ELVO, moderate to severe distal BA stenosis, proximal R ICA 60% stenosis. Hyperintense pituitary on T1 (?proteinaceous cyst).  CTA H&N - Approximately 75% diameter stenosis right internal carotid artery proximally due to circumferential calcific stenosis. Atherosclerotic calcification left carotid bifurcation without stenosis. Both vertebral arteries widely patent to the basilar. No intracranial large vessel occlusion. Moderate stenosis right cavernous carotid and mild stenosis left cavernous carotid. Chronic infarct left parietal lobe. No acute intracranial abnormality.  2D Echo EF >75%. No source of embolus   LDL 165  HgbA1c 7.8  VTE prophylaxis - Lovenox 40 mg sq daily   clopidogrel 75 mg daily prior to admission (off 5 days prior to bladder bx on 01/24/2020), now on plavix 63m daily (ASA allergy)  Therapy recommendations:  SNF --> HH PT/OT  Disposition:   Home with family  Today, 03/12/2020, Katherine [name] being seen for hospital follow-up accompanied by her daughter, DLangley Gauss  Since discharge, she reports her vertigo/dizziness has been slowly improving.  [facility] with [facility] benefit.  Continues to ambulate with RW which she was using previously.  Denies new stroke/TIA symptoms.  Chronic hemianopia, right hemiparesis and aphasia stable without worsening.  Remains on Plavix 75 mg daily without recent bleeding or bruising but does report episode of hematuria approximately 2 weeks ago and self held her Plavix for approximately 1 week with resolution. Per patient and  daughter, she attempted to call her urologist on a Saturday due to hematuria and was contacted the following day (she believes possibly a PA) and was told that providers are not in the office 24/7 but did not give  her any further recommendations nor received a call back (again, this is per patient report and unable to view any documented information via epic).  She denies any additional hematuria or any other bleeding or bruising concerns since restarting.  Remains on atorvastatin 80 mg daily without side effects.  Blood pressure today 158/80.  Glucose levels stable per patient.  No further concerns at this time.    ROS:   14 system review of systems performed and negative with exception of those listed in HPI  PMH:  Past Medical History:  Diagnosis Date  . Anemia    mild  . Anginal pain ([place])   . Aortic stenosis   . Atrial fibrillation ([place])   . Bowel obstruction ([place]) 2015   PSBO  . Bowel obstruction ([place]) 10/2017  . CAD (coronary artery disease)   . Carotid artery stenosis   . CHF (congestive heart failure) ([place])   . Chronic kidney disease    renal insufficiency - DR Florene [place] yearly  . Diabetes mellitus without complication ([place])    type 2  . Full dentures   . History of hiatal hernia   . Hypertension   . Mobility impaired    uses walker for ambulation  . Stroke [facility])    right sided weakness,walker  . Wears glasses     PSH:  Past Surgical History:  Procedure Laterality Date  . ABDOMINAL HYSTERECTOMY    . angiodysplasia    . APPENDECTOMY    . BREAST DUCTAL SYSTEM EXCISION  03/02/2012   Procedure: EXCISION DUCTAL SYSTEM BREAST;  Surgeon: Adin Hector, MD;  Location: [facility];  Service: General;  Laterality: Left;  . BREAST EXCISIONAL BIOPSY    . BREAST SURGERY     bx rt and lt   . CATARACT EXTRACTION     left  . CHOLECYSTECTOMY    . COLONOSCOPY N/A 10/15/2012   Procedure: COLONOSCOPY;  Surgeon: Beryle Beams, MD;  Location: [facility];  Service: Endoscopy;  Laterality: N/A;  . CYSTOSCOPY WITH BIOPSY N/A 01/24/2020   Procedure: CYSTOSCOPY WITH BLADDER BIOPSY AND FULGERATION;  Surgeon: Robley Fries, MD;  Location: [facility];  Service: Urology;   Laterality: N/A;  . DILATION AND CURETTAGE OF UTERUS    . DVT    . ENTEROSCOPY N/A 11/17/2014   Procedure: ENTEROSCOPY;  Surgeon: Carol Ada, MD;  Location: [facility];  Service: Endoscopy;  Laterality: N/A;  . ESOPHAGOGASTRODUODENOSCOPY N/A 09/10/2012   Procedure: ESOPHAGOGASTRODUODENOSCOPY (EGD);  Surgeon: Beryle Beams, MD;  Location: [facility];  Service: Endoscopy;  Laterality: N/A;  . EYE SURGERY     right-glaucoma  . HOT HEMOSTASIS N/A 09/10/2012   Procedure: HOT HEMOSTASIS (ARGON PLASMA COAGULATION/BICAP);  Surgeon: Beryle Beams, MD;  Location: [facility];  Service: Endoscopy;  Laterality: N/A;  . HOT HEMOSTASIS N/A 11/17/2014   Procedure: HOT HEMOSTASIS (ARGON PLASMA COAGULATION/BICAP);  Surgeon: Carol Ada, MD;  Location: [facility];  Service: Endoscopy;  Laterality: N/A;  . TUBAL LIGATION      Social History:  Social History   Socioeconomic History  . Marital status: Widowed    Spouse name: Not on file  . Number of children: Not on file  . Years of education: Not on file  . Highest  education level: Not on file  Occupational History  . Not on file  Tobacco Use  . Smoking status: Former Smoker    Quit date: 02/25/2002    Years since quitting: 18.0  . Smokeless tobacco: Never Used  Vaping Use  . Vaping Use: Never used  Substance and Sexual Activity  . Alcohol use: No  . Drug use: No  . Sexual activity: Not Currently  Other Topics Concern  . Not on file  Social History Narrative  . Not on file   Social Determinants of Health   Financial Resource Strain: Not on file  Food Insecurity: Not on file  Transportation Needs: Not on file  Physical Activity: Not on file  Stress: Not on file  Social Connections: Not on file  Intimate Partner Violence: Not on file    Family History:  Family History  Problem Relation Age of Onset  . Heart disease Sister   . Heart disease Brother   . Heart disease Mother   . Heart disease Father   . Heart disease  Daughter     Medications:   Current Outpatient Medications on File Prior to Visit  Medication Sig Dispense Refill  . Accu-Chek FastClix Lancets MISC daily.    Marland Kitchen ACCU-CHEK GUIDE test strip 1 strip daily.    Marland Kitchen amLODipine-valsartan (EXFORGE) 5-320 MG tablet Take 1 tablet by mouth daily.     Marland Kitchen atorvastatin (LIPITOR) 80 MG tablet Take 1 tablet (80 mg total) by mouth daily. 30 tablet 0  . bimatoprost (LUMIGAN) 0.01 % SOLN Place 1 drop into both eyes at bedtime.    . Blood Glucose Monitoring Suppl (ACCU-CHEK GUIDE) w/Device KIT daily.     . brimonidine (ALPHAGAN P) 0.1 % SOLN Place 1 drop into both eyes 3 (three) times daily.     . clopidogrel (PLAVIX) 75 MG tablet Take 1 tablet (75 mg total) by mouth daily. 30 tablet 3  . dicyclomine (BENTYL) 10 MG capsule Take 10 mg by mouth 3 (three) times daily.     . dorzolamide (TRUSOPT) 2 % ophthalmic solution Place 1 drop into both eyes 3 (three) times daily.     . ergocalciferol (VITAMIN D2) 50000 units capsule Take 50,000 Units by mouth every Saturday.     . Febuxostat 80 MG TABS Take 80 mg by mouth daily.    . ferrous gluconate (FERGON) 324 MG tablet Take 324 mg by mouth daily.    . insulin aspart protamine- aspart (NOVOLOG MIX 70/30) (70-30) 100 UNIT/ML injection Inject 0.2 mLs (20 Units total) into the skin 2 (two) times daily with a meal. Inject 20 units subcutaneously every morning and 10 units at night (Patient taking differently: Inject 20-45 Units into the skin See admin instructions. Inject 45 units in the morning and 20 units at night) 10 mL 11  . lidocaine (LIDODERM) 5 % Place 1 patch onto the skin daily. Remove & Discard patch within 12 hours or as directed by MD 30 patch 0  . omeprazole (PRILOSEC) 40 MG capsule Take 1 capsule (40 mg total) by mouth daily. 30 capsule 3   No current facility-administered medications on file prior to visit.    Allergies:   Allergies  Allergen Reactions  . Aspirin Hives  . Blue Dyes (Parenteral) Hives  .  Ibuprofen Hives  . Contrast Media [Iodinated Diagnostic Agents] Other (See Comments)    Unknown reaction  . Iohexol Other (See Comments)    Unknown reaction  . Shellfish-Derived Products Hives  OBJECTIVE:  Physical Exam  Vitals:   03/12/20 1514  BP: (!) 158/80  Pulse: 62  Weight: 193 lb 12.8 oz (87.9 kg)  Height: '5\' 7"'  (1.702 m)   Body mass index is 30.35 kg/m. No exam data present  General: well developed, well nourished,  very pleasant elderly African-American female, seated, in no evident distress Head: head normocephalic and atraumatic.   Neck: supple with no carotid or supraclavicular bruits Cardiovascular: regular rate and rhythm, no murmurs Musculoskeletal: no deformity Skin:  no rash/petichiae Vascular:  Normal pulses all extremities   Neurologic Exam Mental Status: Awake and fully alert.   Expressive aphasia (chronic at baseline).  Oriented to place and time. Recent and remote memory intact. Attention span, concentration and fund of knowledge appropriate. Mood and affect appropriate.  Cranial Nerves: Fundoscopic exam difficulty viewing.  Right pupil not reactive (chronic postsurgical) and left pupil slightly irregular but reactive. Extraocular movements full and able to cross midline but mild difficulty looking towards the left.  No evidence of nystagmus. Visual fields right superior homonymous quadrantanopia. Hearing intact. Facial sensation intact.  Mild right lower facial weakness.  Tongue, palate moves normally and symmetrically.  Motor: Normal bulk and tone. Normal strength in all tested extremity muscles except mild chronic right hip flexor weakness and mild right grip weakness Sensory.:  Decreased sensory right upper and lower extremity compared to left upper and lower extremity Coordination: Rapid alternating movements normal in all extremities except slightly decreased right hand. Finger-to-nose and heel-to-shin performed accurately bilaterally. Gait and  Station: Arises from chair without difficulty. Stance is normal. Gait demonstrates  normal stride length and balance with use of rolling walker Reflexes: Slightly increased RUE and RLE compared to LUE and LLE. Toes downgoing.     NIHSS 6 (chronic) Modified Rankin  1 (compared to baseline)      ASSESSMENT: Katherine [name] is a 87 y.o. year old female presented with dizziness on 02/01/2020 likely transient vertigo possibly from peripheral vestibular dysfunction with prior history of positional vertigo with MRI negative for acute infarct. Vascular risk factors include hx of R MCA stroke (residual hemianopia, right hemiparesis and aphasia), DM, HTN, HLD, CAD, CHF, hx of L DVT treated with warfarin, former tobacco use, BA stenosis and carotid stenosis s/p L CEA 2003.      PLAN:  1. Transient vertigo: Likely BPPV -gradually improving.  Continue working with [facility] PT for hopeful ongoing improvement.  Discussed possible OP vestibular rehab once [facility] completed if indicated.  Discussed use of RW at all times for fall prevention 2. Hx R MCA stroke :  a. Residual deficit: Right homonymous quadrantanopsia, mild right hemiparesis and aphasia currently at baseline.  b. Continue clopidogrel 75 mg daily  and atorvastatin 80 mg daily for secondary stroke prevention.   c. Discussed secondary stroke prevention measures and importance of close PCP follow up for aggressive stroke risk factor management  3. Carotid stenosis: CTA right ICA 75% stenosis 02/2020; prior carotid duplex 01/2017 right ICA 1 to 39% stenosis and left ICA s/p CEA no significant stenosis.  Advised routine follow-up needed with Dr[facility]netta Hutching at [facility] 4. HTN: BP goal <130/90.  Slightly elevated today on Exforge and torsemide per PCP 5. HLD: LDL goal <70. Recent LDL 165 on atorvastatin 40 mg daily therefore increased dosage to 80 mg daily during recent stroke admission.  Recommend follow-up with PCP in the next 1 to 2 months for repeat lipid panel 6. DMII:  A1c goal<7.0. Recent A1c 7.8. On NovoLog  per PCP 7. Hematuria: known intermittent episodes. Reports recent episode approx 2 weeks ago resolved after holding plavix. Advised f/u needed with urology. Also advised not to stop plavix on her own unless further instructed by urology    Follow up in 3 months or call earlier if needed   CC:  [place] provider: Dr. Hollace Hayward, MD  (PCP) Jacalyn Lefevre, MD (urology)  I spent 45 minutes of face-to-face and non-face-to-face time with patient and daughter.  This included previsit chart review including recent hospitalization pertinent progress notes, lab work and imaging, lab review, study review, order entry, electronic health record documentation, patient education regarding recent vertigo episode, continued dizziness, hx of prior stroke, episode of hematuria, importance of managing stroke risk factors and answered all questions to patient and daughters satisfaction   Frann Rider, AGNP-BC  [facility] [address] [place], [place] [address]  Phone [phone] Fax [phone] Note: This document was prepared with digital dictation and possible smart phrase technology. Any transcriptional errors that result from this process are unintentional.

## 2020-03-12 NOTE — Progress Notes (Signed)
I agree with the above plan 

## 2020-06-20 ENCOUNTER — Ambulatory Visit: Payer: Medicare Other | Admitting: Adult Health

## 2020-07-10 IMAGING — MG DIGITAL SCREENING BILATERAL MAMMOGRAM WITH TOMO AND CAD
6 of 10 series · 6 of 30 positions shown · non-contrast
Comparison: Previous exam(s).

CLINICAL DATA: Screening.

EXAM:
DIGITAL SCREENING BILATERAL MAMMOGRAM WITH TOMO AND CAD

[R CC synth-2D]
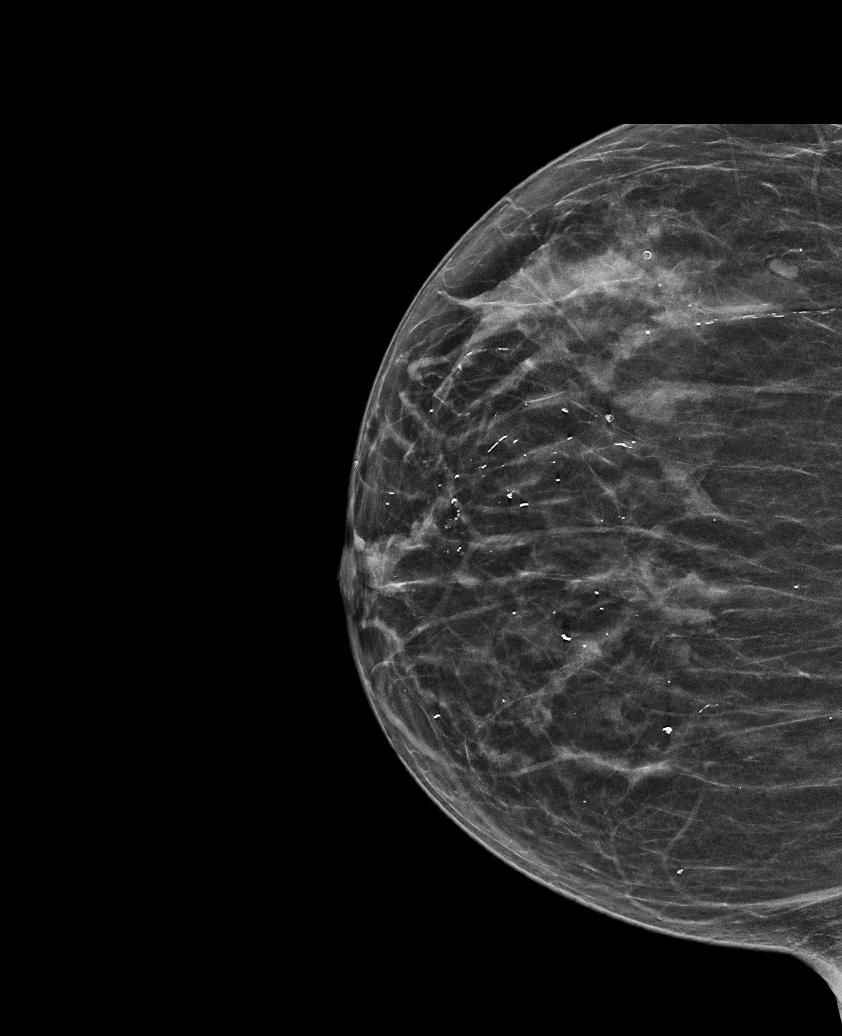

[L MLO synth-2D]
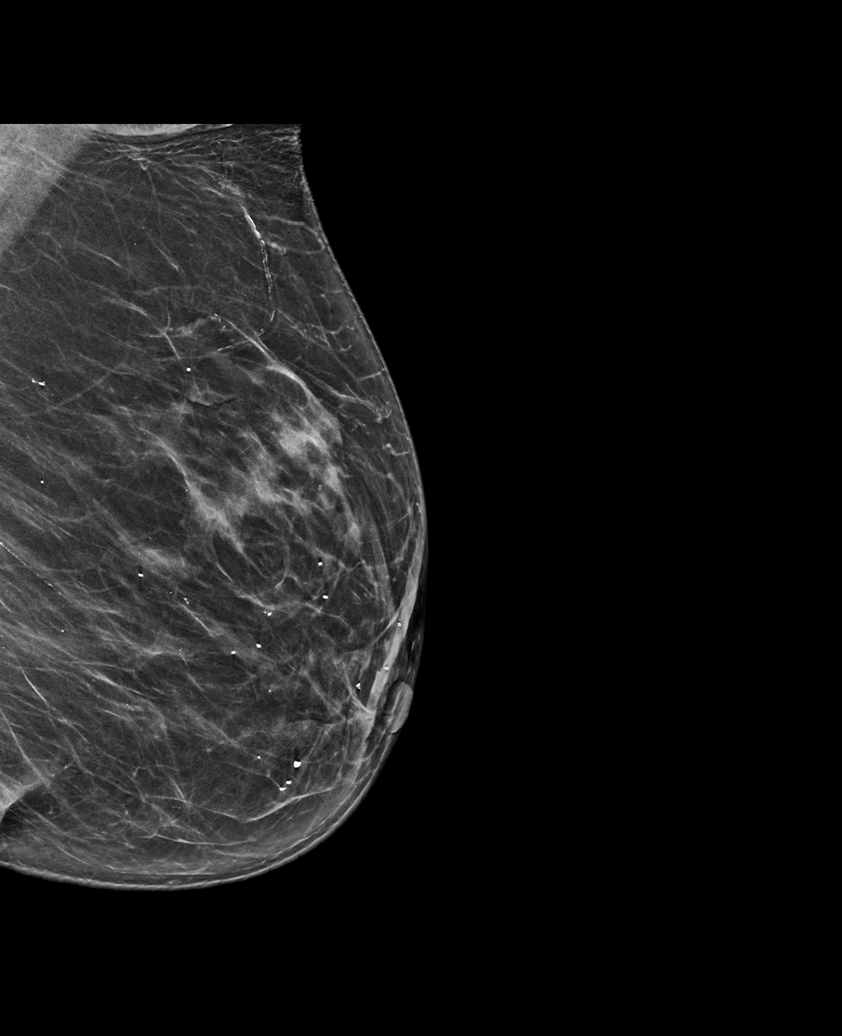

[L XCCL synth-2D]
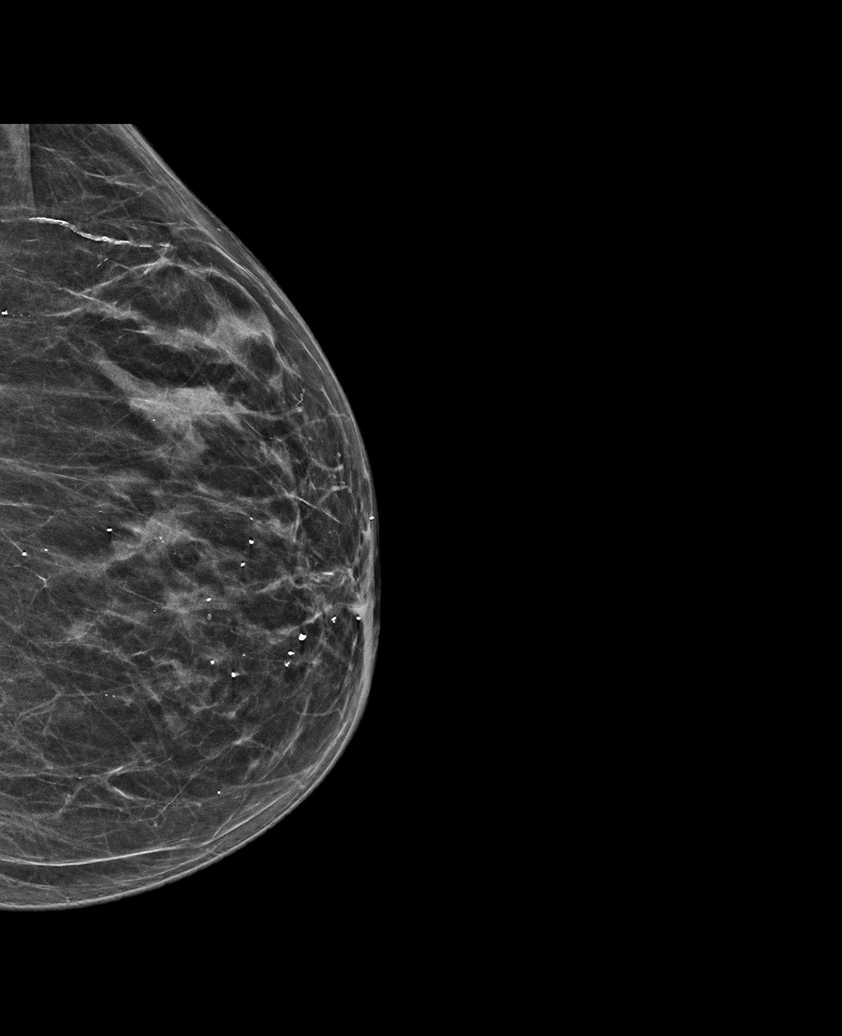

[R MLO synth-2D]
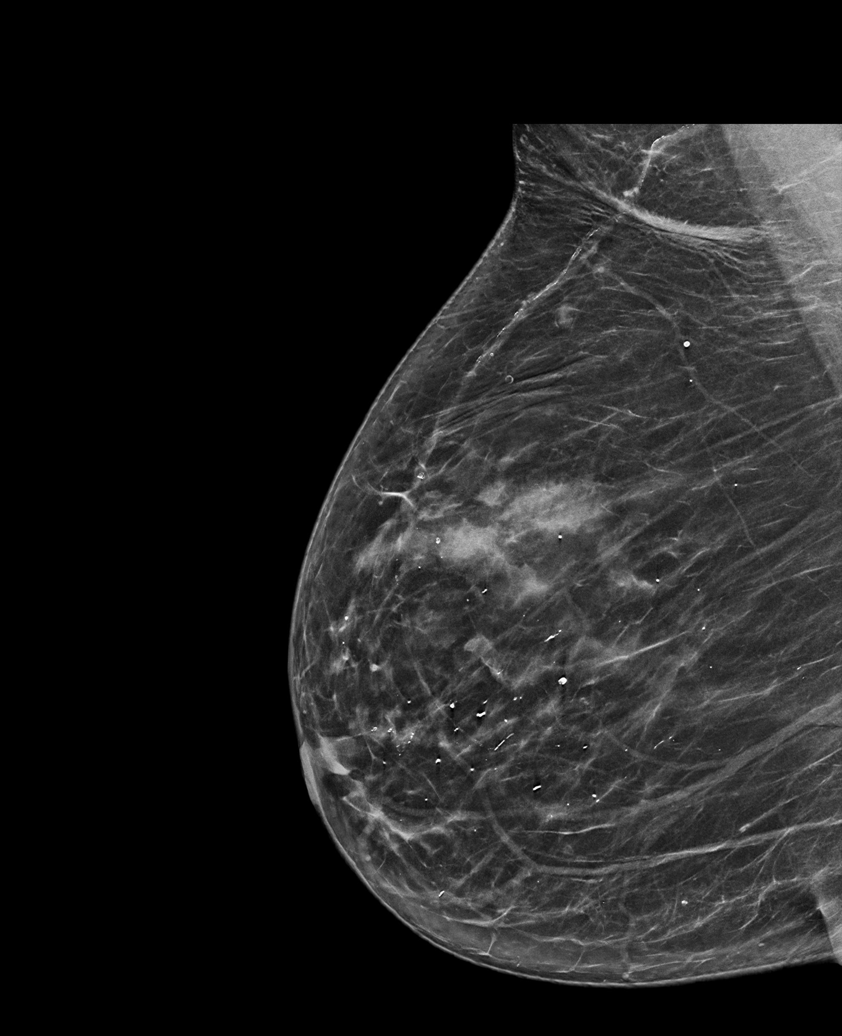

[L CC synth-2D]
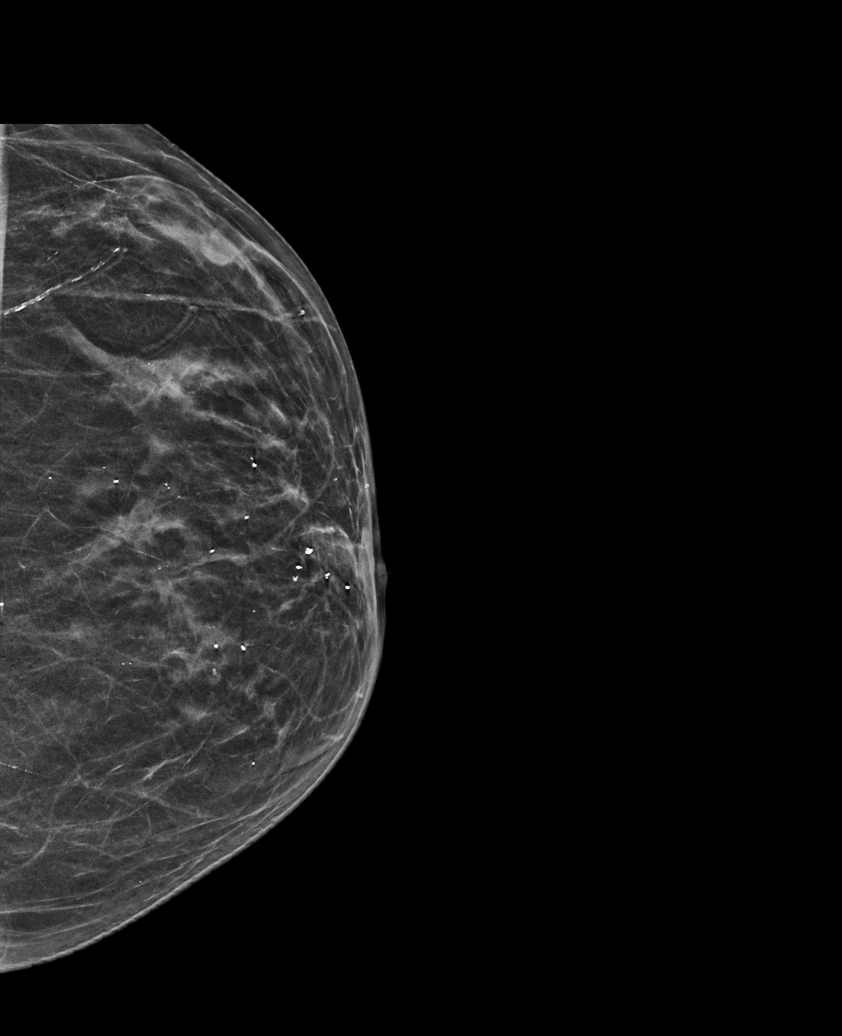

[L MLO tomo · tomo slice 35/68.0]
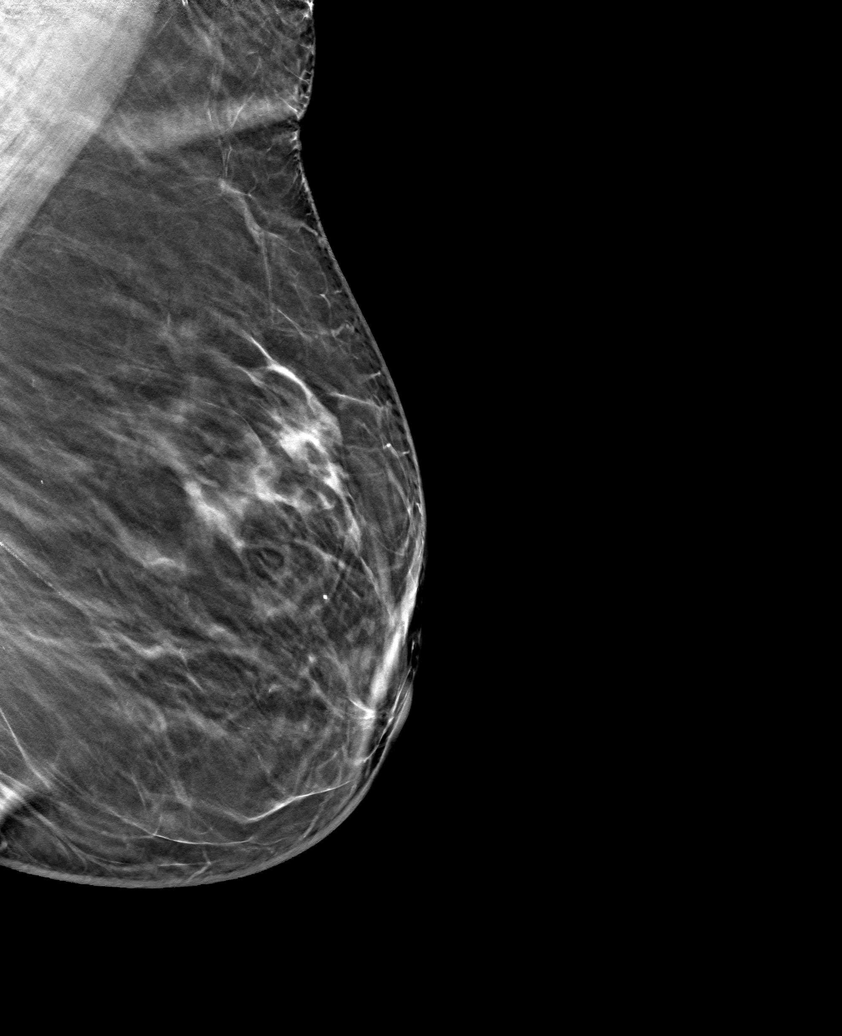

[6 of 30 positions shown; findings below may reference images not displayed]

ACR Breast Density Category b: There are scattered areas of
fibroglandular density.
FINDINGS: There are no findings suspicious for malignancy. Images were
processed with CAD.
IMPRESSION: No mammographic evidence of malignancy. A result letter of this
screening mammogram will be mailed directly to the patient.

RECOMMENDATION:
Screening mammogram in one year. (Code:[id number])

BI-RADS CATEGORY  1: Negative.

## 2022-03-17 ENCOUNTER — Other Ambulatory Visit (HOSPITAL_COMMUNITY): Payer: Medicare Other

## 2022-03-17 ENCOUNTER — Encounter (HOSPITAL_COMMUNITY): Payer: Self-pay

## 2022-03-17 ENCOUNTER — Inpatient Hospital Stay (HOSPITAL_COMMUNITY)
Admission: EM | Admit: 2022-03-17 | Discharge: 2022-03-27 | DRG: 389 | Disposition: A | Discharge status: Home Health | Payer: Medicare Other | Attending: Internal Medicine | Admitting: Internal Medicine

## 2022-03-17 ENCOUNTER — Emergency Department (HOSPITAL_COMMUNITY): Payer: Medicare Other

## 2022-03-17 ENCOUNTER — Other Ambulatory Visit: Payer: Self-pay

## 2022-03-17 DIAGNOSIS — K5641 Fecal impaction: Secondary | ICD-10-CM | POA: Diagnosis present

## 2022-03-17 DIAGNOSIS — Z87891 Personal history of nicotine dependence: Secondary | ICD-10-CM

## 2022-03-17 DIAGNOSIS — E118 Type 2 diabetes mellitus with unspecified complications: Secondary | ICD-10-CM

## 2022-03-17 DIAGNOSIS — E1165 Type 2 diabetes mellitus with hyperglycemia: Secondary | ICD-10-CM | POA: Diagnosis not present

## 2022-03-17 DIAGNOSIS — I10 Essential (primary) hypertension: Secondary | ICD-10-CM

## 2022-03-17 DIAGNOSIS — Z7902 Long term (current) use of antithrombotics/antiplatelets: Secondary | ICD-10-CM

## 2022-03-17 DIAGNOSIS — I350 Nonrheumatic aortic (valve) stenosis: Secondary | ICD-10-CM | POA: Diagnosis present

## 2022-03-17 DIAGNOSIS — Z95828 Presence of other vascular implants and grafts: Secondary | ICD-10-CM

## 2022-03-17 DIAGNOSIS — I130 Hypertensive heart and chronic kidney disease with heart failure and stage 1 through stage 4 chronic kidney disease, or unspecified chronic kidney disease: Secondary | ICD-10-CM | POA: Diagnosis present

## 2022-03-17 DIAGNOSIS — Z79899 Other long term (current) drug therapy: Secondary | ICD-10-CM

## 2022-03-17 DIAGNOSIS — N390 Urinary tract infection, site not specified: Secondary | ICD-10-CM | POA: Diagnosis present

## 2022-03-17 DIAGNOSIS — Z888 Allergy status to other drugs, medicaments and biological substances status: Secondary | ICD-10-CM

## 2022-03-17 DIAGNOSIS — I480 Paroxysmal atrial fibrillation: Secondary | ICD-10-CM

## 2022-03-17 DIAGNOSIS — I5032 Chronic diastolic (congestive) heart failure: Secondary | ICD-10-CM

## 2022-03-17 DIAGNOSIS — Z9049 Acquired absence of other specified parts of digestive tract: Secondary | ICD-10-CM

## 2022-03-17 DIAGNOSIS — E876 Hypokalemia: Secondary | ICD-10-CM | POA: Diagnosis not present

## 2022-03-17 DIAGNOSIS — Z794 Long term (current) use of insulin: Secondary | ICD-10-CM

## 2022-03-17 DIAGNOSIS — Z91013 Allergy to seafood: Secondary | ICD-10-CM

## 2022-03-17 DIAGNOSIS — Z6826 Body mass index (BMI) 26.0-26.9, adult: Secondary | ICD-10-CM

## 2022-03-17 DIAGNOSIS — K56609 Unspecified intestinal obstruction, unspecified as to partial versus complete obstruction: Secondary | ICD-10-CM | POA: Diagnosis not present

## 2022-03-17 DIAGNOSIS — K5669 Other partial intestinal obstruction: Principal | ICD-10-CM | POA: Diagnosis present

## 2022-03-17 DIAGNOSIS — H409 Unspecified glaucoma: Secondary | ICD-10-CM | POA: Diagnosis present

## 2022-03-17 DIAGNOSIS — E1122 Type 2 diabetes mellitus with diabetic chronic kidney disease: Secondary | ICD-10-CM | POA: Diagnosis present

## 2022-03-17 DIAGNOSIS — N1832 Chronic kidney disease, stage 3b: Secondary | ICD-10-CM | POA: Diagnosis present

## 2022-03-17 DIAGNOSIS — E785 Hyperlipidemia, unspecified: Secondary | ICD-10-CM | POA: Diagnosis present

## 2022-03-17 DIAGNOSIS — I2510 Atherosclerotic heart disease of native coronary artery without angina pectoris: Secondary | ICD-10-CM | POA: Diagnosis present

## 2022-03-17 DIAGNOSIS — Z90710 Acquired absence of both cervix and uterus: Secondary | ICD-10-CM

## 2022-03-17 DIAGNOSIS — Z8673 Personal history of transient ischemic attack (TIA), and cerebral infarction without residual deficits: Secondary | ICD-10-CM

## 2022-03-17 DIAGNOSIS — E11649 Type 2 diabetes mellitus with hypoglycemia without coma: Secondary | ICD-10-CM | POA: Diagnosis not present

## 2022-03-17 DIAGNOSIS — Z91041 Radiographic dye allergy status: Secondary | ICD-10-CM

## 2022-03-17 DIAGNOSIS — Z886 Allergy status to analgesic agent status: Secondary | ICD-10-CM

## 2022-03-17 DIAGNOSIS — Z8249 Family history of ischemic heart disease and other diseases of the circulatory system: Secondary | ICD-10-CM

## 2022-03-17 DIAGNOSIS — E663 Overweight: Secondary | ICD-10-CM | POA: Diagnosis present

## 2022-03-17 LAB — CBC WITH DIFFERENTIAL/PLATELET
Abs Immature Granulocytes: 0.03 10*3/uL (ref 0.00–0.07)
Basophils Absolute: 0 10*3/uL (ref 0.0–0.1)
Basophils Relative: 0 %
Eosinophils Absolute: 0 10*3/uL (ref 0.0–0.5)
Eosinophils Relative: 0 %
HCT: 36.2 % (ref 36.0–46.0)
Hemoglobin: 11.8 g/dL — ABNORMAL LOW (ref 12.0–15.0)
Immature Granulocytes: 0 %
Lymphocytes Relative: 8 %
Lymphs Abs: 0.6 10*3/uL — ABNORMAL LOW (ref 0.7–4.0)
MCH: 29.3 pg (ref 26.0–34.0)
MCHC: 32.6 g/dL (ref 30.0–36.0)
MCV: 89.8 fL (ref 80.0–100.0)
Monocytes Absolute: 0.3 10*3/uL (ref 0.1–1.0)
Monocytes Relative: 4 %
Neutro Abs: 6.9 10*3/uL (ref 1.7–7.7)
Neutrophils Relative %: 88 %
Platelets: 297 10*3/uL (ref 150–400)
RBC: 4.03 MIL/uL (ref 3.87–5.11)
RDW: 14.8 % (ref 11.5–15.5)
WBC: 7.9 10*3/uL (ref 4.0–10.5)
nRBC: 0 % (ref 0.0–0.2)

## 2022-03-17 LAB — URINALYSIS, MICROSCOPIC (REFLEX)

## 2022-03-17 LAB — COMPREHENSIVE METABOLIC PANEL
ALT: 13 U/L (ref 0–44)
AST: 26 U/L (ref 15–41)
Albumin: 3.2 g/dL — ABNORMAL LOW (ref 3.5–5.0)
Alkaline Phosphatase: 66 U/L (ref 38–126)
Anion gap: 13 (ref 5–15)
BUN: 20 mg/dL (ref 8–23)
CO2: 18 mmol/L — ABNORMAL LOW (ref 22–32)
Calcium: 8.4 mg/dL — ABNORMAL LOW (ref 8.9–10.3)
Chloride: 112 mmol/L — ABNORMAL HIGH (ref 98–111)
Creatinine, Ser: 0.85 mg/dL (ref 0.44–1.00)
GFR, Estimated: >60 mL/min (ref >60)
Glucose, Bld: 205 mg/dL — ABNORMAL HIGH (ref 70–99)
Potassium: 4 mmol/L (ref 3.5–5.1)
Sodium: 143 mmol/L (ref 135–145)
Total Bilirubin: 0.7 mg/dL (ref 0.3–1.2)
Total Protein: 5.8 g/dL — ABNORMAL LOW (ref 6.5–8.1)

## 2022-03-17 LAB — HEMOGLOBIN A1C
Hgb A1c MFr Bld: 7.4 % — ABNORMAL HIGH (ref 4.8–5.6)
Mean Plasma Glucose: 165.68 mg/dL

## 2022-03-17 LAB — URINALYSIS, ROUTINE W REFLEX MICROSCOPIC
Bilirubin Urine: NEGATIVE
Glucose, UA: 100 mg/dL — AB
Ketones, ur: NEGATIVE mg/dL
Nitrite: NEGATIVE
Protein, ur: 30 mg/dL — AB
Specific Gravity, Urine: 1.015 (ref 1.005–1.030)
pH: 5.5 (ref 5.0–8.0)

## 2022-03-17 LAB — CBG MONITORING, ED
Glucose-Capillary: 193 mg/dL — ABNORMAL HIGH (ref 70–99)
Glucose-Capillary: 177 mg/dL — ABNORMAL HIGH (ref 70–99)

## 2022-03-17 LAB — LIPASE, BLOOD: Lipase: 32 U/L (ref 11–51)

## 2022-03-17 MED ORDER — METHYLPREDNISOLONE SODIUM SUCC 40 MG IJ SOLR
40.0000 mg | Freq: Once | INTRAMUSCULAR | Status: AC
  Administered 2022-03-17: 40 mg via INTRAVENOUS
  Filled 2022-03-17: qty 1

## 2022-03-17 MED ORDER — INSULIN ASPART 100 UNIT/ML IJ SOLN
0.0000 [IU] | Freq: Three times a day (TID) | INTRAMUSCULAR | Status: DC
  Administered 2022-03-18 – 2022-03-20 (×3): 2 [IU] via SUBCUTANEOUS
  Administered 2022-03-21 (×2): 3 [IU] via SUBCUTANEOUS

## 2022-03-17 MED ORDER — AMLODIPINE BESYLATE 5 MG PO TABS: 5.0000 mg | ORAL_TABLET | Freq: Every day | ORAL | Status: DC

## 2022-03-17 MED ORDER — DIPHENHYDRAMINE HCL 25 MG PO CAPS
50.0000 mg | ORAL_CAPSULE | Freq: Once | ORAL | Status: AC
  Administered 2022-03-17: 50 mg via ORAL
  Filled 2022-03-17: qty 2

## 2022-03-17 MED ORDER — ONDANSETRON HCL 4 MG/2ML IJ SOLN
4.0000 mg | Freq: Once | INTRAMUSCULAR | Status: AC
  Administered 2022-03-17: 4 mg via INTRAVENOUS
  Filled 2022-03-17: qty 2

## 2022-03-17 MED ORDER — CLOPIDOGREL BISULFATE 75 MG PO TABS: 75.0000 mg | ORAL_TABLET | Freq: Every day | ORAL | Status: DC

## 2022-03-17 MED ORDER — SODIUM CHLORIDE 0.9 % IV SOLN
1.0000 g | Freq: Once | INTRAVENOUS | Status: AC
  Administered 2022-03-17: 1 g via INTRAVENOUS
  Filled 2022-03-17: qty 10

## 2022-03-17 MED ORDER — SODIUM CHLORIDE 0.9 % IV SOLN: INTRAVENOUS | Status: AC

## 2022-03-17 MED ORDER — MORPHINE SULFATE (PF) 2 MG/ML IV SOLN
2.0000 mg | INTRAVENOUS | Status: DC | PRN
  Administered 2022-03-17 – 2022-03-20 (×11): 2 mg via INTRAVENOUS
  Filled 2022-03-17 (×12): qty 1

## 2022-03-17 MED ORDER — IRBESARTAN 300 MG PO TABS: 300.0000 mg | ORAL_TABLET | Freq: Every day | ORAL | Status: DC

## 2022-03-17 MED ORDER — HYDROMORPHONE HCL 1 MG/ML IJ SOLN
1.0000 mg | Freq: Once | INTRAMUSCULAR | Status: AC
  Administered 2022-03-17: 1 mg via INTRAVENOUS
  Filled 2022-03-17: qty 1

## 2022-03-17 MED ORDER — IOHEXOL 350 MG/ML SOLN
75.0000 mL | Freq: Once | INTRAVENOUS | Status: AC | PRN
  Administered 2022-03-17: 75 mL via INTRAVENOUS

## 2022-03-17 MED ORDER — AMLODIPINE BESYLATE-VALSARTAN 5-320 MG PO TABS: 1.0000 | ORAL_TABLET | Freq: Every day | ORAL | Status: DC

## 2022-03-17 MED ORDER — SODIUM CHLORIDE 0.9 % IV BOLUS
500.0000 mL | Freq: Once | INTRAVENOUS | Status: AC
  Administered 2022-03-17: 500 mL via INTRAVENOUS

## 2022-03-17 MED ORDER — DIPHENHYDRAMINE HCL 50 MG/ML IJ SOLN
50.0000 mg | Freq: Once | INTRAMUSCULAR | Status: AC
  Filled 2022-03-17: qty 1

## 2022-03-17 MED ORDER — ATORVASTATIN CALCIUM 40 MG PO TABS: 40.0000 mg | ORAL_TABLET | Freq: Every day | ORAL | Status: DC

## 2022-03-17 MED ORDER — HYDROMORPHONE HCL 1 MG/ML IJ SOLN
0.5000 mg | Freq: Once | INTRAMUSCULAR | Status: AC
  Administered 2022-03-17: 0.5 mg via INTRAVENOUS
  Filled 2022-03-17: qty 1

## 2022-03-17 NOTE — Assessment & Plan Note (Signed)
-  insulin-dependent -place on moderate SSI while NPO

## 2022-03-17 NOTE — ED Notes (Signed)
Help get patient undressed into a gown on the monitor did EKG shown to er provider got patient some warm blankets patient has call bell in reach

## 2022-03-17 NOTE — Assessment & Plan Note (Addendum)
-  euvolemic on exam. Gentle IV fluids while NPO overnight.

## 2022-03-17 NOTE — Consult Note (Addendum)
Reason for Consult:SBO Referring Physician: Varney Biles  NAYZETH ALTMAN is an 89 y.o. female.  HPI: 89yo F with PMHx CAD, CHF (sees Dr. Terrence Dupont), CKD, DM, and stroke also has PMHx of SBO in the past. She Presented to the ED C/O N/V and crampy abdominal pain starting yesterday evening. She reports previous episodes in the past of a "blockage" requiring admission and NGT. She underwent W/U in the ED showing WBC 7.9. CT A/P shows SBO with transition in mid to distal jejunum. She reports her pain has improved since she arrived in the ED> I was asked to see her for surgical management.  Past Medical History:  Diagnosis Date   Anemia    mild   Anginal pain (HCC)    Aortic stenosis    Atrial fibrillation (HCC)    Bowel obstruction ([place]) 2015   PSBO   Bowel obstruction ([place]) 10/2017   CAD (coronary artery disease)    Carotid artery stenosis    CHF (congestive heart failure) (HCC)    Chronic kidney disease    renal insufficiency - DR Florene Glen yearly   Diabetes mellitus without complication ([place])    type 2   Full dentures    History of hiatal hernia    Hypertension    Mobility impaired    uses walker for ambulation   Stroke ([place])    right sided weakness,walker   Wears glasses     Past Surgical History:  Procedure Laterality Date   ABDOMINAL HYSTERECTOMY     angiodysplasia     APPENDECTOMY     BREAST DUCTAL SYSTEM EXCISION  03/02/2012   Procedure: EXCISION DUCTAL SYSTEM BREAST;  Surgeon: Adin Hector, MD;  Location: [facility];  Service: General;  Laterality: Left;   BREAST EXCISIONAL BIOPSY     BREAST SURGERY     bx rt and lt    CATARACT EXTRACTION     left   CHOLECYSTECTOMY     COLONOSCOPY N/A 10/15/2012   Procedure: COLONOSCOPY;  Surgeon: Beryle Beams, MD;  [facility];  Service: Endoscopy;  Laterality: N/A;   CYSTOSCOPY WITH BIOPSY N/A 01/24/2020   Procedure: CYSTOSCOPY WITH BLADDER BIOPSY AND FULGERATION;  Surgeon: Robley Fries, MD;   [facility];  Service: Urology;  Laterality: N/A;   DILATION AND CURETTAGE OF UTERUS     DVT     ENTEROSCOPY N/A 11/17/2014   Procedure: ENTEROSCOPY;  Surgeon: Carol Ada, MD;  [facility];  Service: Endoscopy;  Laterality: N/A;   ESOPHAGOGASTRODUODENOSCOPY N/A 09/10/2012   Procedure: ESOPHAGOGASTRODUODENOSCOPY (EGD);  Surgeon: Beryle Beams, MD;  Location: [facility];  Service: Endoscopy;  Laterality: N/A;   EYE SURGERY     right-glaucoma   HOT HEMOSTASIS N/A 09/10/2012   Procedure: HOT HEMOSTASIS (ARGON PLASMA COAGULATION/BICAP);  Surgeon: Beryle Beams, MD;  Location: [facility];  Service: Endoscopy;  Laterality: N/A;   HOT HEMOSTASIS N/A 11/17/2014   Procedure: HOT HEMOSTASIS (ARGON PLASMA COAGULATION/BICAP);  Surgeon: Carol Ada, MD;  Location: [facility];  Service: Endoscopy;  Laterality: N/A;   TUBAL LIGATION      Family History  Problem Relation Age of Onset   Heart disease Sister    Heart disease Brother    Heart disease Mother    Heart disease Father    Heart disease Daughter     Social History:  reports that she quit smoking about 20 years ago. She has never used smokeless tobacco. She reports that she does not  drink alcohol and does not use drugs.  Allergies:  Allergies  Allergen Reactions   Aspirin Hives   Blue Dyes (Parenteral) Hives   Ibuprofen Hives   Contrast Media [Iodinated Contrast Media] Other (See Comments)    Unknown reaction   Iohexol Other (See Comments)    Unknown reaction   Shellfish-Derived Products Hives    Medications: I have reviewed the patient's current medications.  Results for orders placed or performed during the hospital encounter of 03/17/22 (from the past 48 hour(s))  CBC with Differential     Status: Abnormal   Collection Time: 03/17/22  9:15 AM  Result Value Ref Range   WBC 7.9 4.0 - 10.5 K/uL   RBC 4.03 3.87 - 5.11 MIL/uL   Hemoglobin 11.8 (L) 12.0 - 15.0 g/dL   HCT 36.2 36.0 - 46.0 %   MCV 89.8 80.0 -  100.0 fL   MCH 29.3 26.0 - 34.0 pg   MCHC 32.6 30.0 - 36.0 g/dL   RDW 14.8 11.5 - 15.5 %   Platelets 297 150 - 400 K/uL   nRBC 0.0 0.0 - 0.2 %   Neutrophils Relative % 88 %   Neutro Abs 6.9 1.7 - 7.7 K/uL   Lymphocytes Relative 8 %   Lymphs Abs 0.6 (L) 0.7 - 4.0 K/uL   Monocytes Relative 4 %   Monocytes Absolute 0.3 0.1 - 1.0 K/uL   Eosinophils Relative 0 %   Eosinophils Absolute 0.0 0.0 - 0.5 K/uL   Basophils Relative 0 %   Basophils Absolute 0.0 0.0 - 0.1 K/uL   Immature Granulocytes 0 %   Abs Immature Granulocytes 0.03 0.00 - 0.07 K/uL    Comment: Performed at [facility], [address] [place], [place] [address]  Comprehensive metabolic panel     Status: Abnormal   Collection Time: 03/17/22 12:18 PM  Result Value Ref Range   Sodium 143 135 - 145 mmol/L   Potassium 4.0 3.5 - 5.1 mmol/L   Chloride 112 (H) 98 - 111 mmol/L   CO2 18 (L) 22 - 32 mmol/L   Glucose, Bld 205 (H) 70 - 99 mg/dL    Comment: Glucose reference range applies only to samples taken after fasting for at least 8 hours.   BUN 20 8 - 23 mg/dL   Creatinine, Ser 0.85 0.44 - 1.00 mg/dL   Calcium 8.4 (L) 8.9 - 10.3 mg/dL   Total Protein 5.8 (L) 6.5 - 8.1 g/dL   Albumin 3.2 (L) 3.5 - 5.0 g/dL   AST 26 15 - 41 U/L   ALT 13 0 - 44 U/L   Alkaline Phosphatase 66 38 - 126 U/L   Total Bilirubin 0.7 0.3 - 1.2 mg/dL   GFR, Estimated >60 >60 mL/min    Comment: (NOTE) Calculated using the CKD-EPI Creatinine Equation (2021)    Anion gap 13 5 - 15    Comment: Performed at [facility], [place] [address] [place], [place] [address]  Lipase, blood     Status: None   Collection Time: 03/17/22 12:18 PM  Result Value Ref Range   Lipase 32 11 - 51 U/L    Comment: HEMOLYSIS AT THIS LEVEL MAY AFFECT RESULT Performed at [facility], [place] [address] [place], [place] [address]   Urinalysis, Routine w reflex microscopic Urine, Clean Catch     Status: Abnormal   Collection Time: 03/17/22 12:28 PM  Result Value  Ref Range   Color, Urine YELLOW YELLOW  APPearance CLEAR CLEAR   Specific Gravity, Urine 1.015 1.005 - 1.030   pH 5.5 5.0 - 8.0   Glucose, UA 100 (A) NEGATIVE mg/dL   Hgb urine dipstick TRACE (A) NEGATIVE   Bilirubin Urine NEGATIVE NEGATIVE   Ketones, ur NEGATIVE NEGATIVE mg/dL   Protein, ur 30 (A) NEGATIVE mg/dL   Nitrite NEGATIVE NEGATIVE   Leukocytes,Ua SMALL (A) NEGATIVE    Comment: Performed at [facility] [address] [place], [place] [address]  Urinalysis, Microscopic (reflex)     Status: Abnormal   Collection Time: 03/17/22 12:28 PM  Result Value Ref Range   RBC / HPF 0-5 0 - 5 RBC/hpf   WBC, UA 6-10 0 - 5 WBC/hpf   Bacteria, UA MANY (A) NONE SEEN   Squamous Epithelial / HPF 0-5 0 - 5 /HPF    Comment: Performed at [facility], [place] [address], [place], [place] [address]    CT ABDOMEN PELVIS W CONTRAST  Result Date: 03/17/2022 CLINICAL DATA:  Generalized abdominal pain, nausea and vomiting since yesterday EXAM: CT ABDOMEN AND PELVIS WITH CONTRAST TECHNIQUE: Multidetector CT imaging of the abdomen and pelvis was performed using the standard protocol following bolus administration of intravenous contrast. RADIATION DOSE REDUCTION: This exam was performed according to the departmental dose-optimization program which includes automated exposure control, adjustment of the mA and/or kV according to patient size and/or use of iterative reconstruction technique. CONTRAST:  85m OMNIPAQUE IOHEXOL 350 MG/ML SOLN COMPARISON:  11/29/2019 FINDINGS: Lower chest: No acute pleural or parenchymal lung disease. Hepatobiliary: No focal liver abnormality is seen. Status post cholecystectomy. No biliary dilatation. Pancreas: Unremarkable. No pancreatic ductal dilatation or surrounding inflammatory changes. Spleen: Normal in size without focal abnormality. Adrenals/Urinary Tract: Gas in the bladder lumen, please correlate with any recent catheterization. No bladder wall thickening or  perivesicular fat stranding. The kidneys enhance normally and symmetrically. No urinary tract calculi or obstruction. The adrenals are unremarkable. Stomach/Bowel: Distended proximal small bowel measuring up to 3 cm in diameter, with numerous gas fluid levels. Transition within the mid to distal jejunum in the lower central abdomen, consistent with small-bowel obstruction. Fluid-filled esophagus may reflect reflux. Vascular/Lymphatic: Aortic atherosclerosis. IVC filter. No pathologic adenopathy. Reproductive: Status post hysterectomy. No adnexal masses. Other: No free fluid or free intraperitoneal gas. No abdominal wall hernia. Musculoskeletal: No acute or destructive bony lesions. Reconstructed images demonstrate no additional findings. IMPRESSION: 1. Small bowel obstruction, with transition in the mid to distal jejunum within the lower abdomen. 2. Fluid within the distal esophagus may reflect reflux. 3.  Aortic Atherosclerosis (ICD10-I70.0). 4. Gas in the bladder lumen, please correlate with any recent catheterization. Electronically Signed   By: MRanda NgoM.D.   On: 03/17/2022 16:34    Review of Systems  Constitutional:  Positive for appetite change.  HENT: Negative.    Eyes: Negative.   Respiratory:  Negative for chest tightness.   Cardiovascular:  Negative for chest pain.  Gastrointestinal:  Positive for abdominal distention, abdominal pain, nausea and vomiting.  Genitourinary: Negative.   Musculoskeletal: Negative.   Skin: Negative.   Allergic/Immunologic: Negative.   Neurological:  Positive for speech difficulty.       Chronic since stroke  Hematological: Negative.   Psychiatric/Behavioral: Negative.     Blood pressure 129/84, pulse 81, temperature (!) 97.4 F (36.3 C), temperature source Oral, resp. rate 18, SpO2 100 %. Physical Exam HENT:     Mouth/Throat:     Mouth: Mucous membranes are moist.  Eyes:     Extraocular Movements: Extraocular movements intact.     Pupils: Pupils  are equal, round, and reactive to light.  Cardiovascular:     Rate and Rhythm: Normal rate.     Comments: Regular with ectopy Pulmonary:     Effort: Pulmonary effort is normal.     Breath sounds: Normal breath sounds.  Abdominal:     General: Bowel sounds are decreased. There is distension.     Palpations: Abdomen is soft.     Comments: Mild mid abd tenderness without guarding or peritonitis Complex lower midline scar pattern  Skin:    General: Skin is warm.     Capillary Refill: Capillary refill takes 2 to 3 seconds.  Neurological:     Mental Status: She is alert.     Comments: Some speech difficulty with word finding  Psychiatric:        Mood and Affect: Mood normal.     Assessment/Plan: SBO - agree with plan for NGT to [facility]. This has been ordered. She has an IV contrast allergy so will hold off on gastrografin for now. No need for emergent surgery. We will follow with you.  Agree with medical admission for multiple medical problems  Zenovia Jarred 03/17/2022, 5:21 PM

## 2022-03-17 NOTE — Assessment & Plan Note (Addendum)
-  controlled. Continue home antihypertensive once permitted to have oral intake

## 2022-03-17 NOTE — H&P (Signed)
History and Physical    Patient: Katherine [name] CHE:[id number] DOB: Nov 28, 1930 DOA: 03/17/2022 DOS: the patient was seen and examined on 03/17/2022 PCP: [name], MD  Patient coming from: Home  Chief Complaint:  Chief Complaint  Patient presents with   Abdominal Pain   HPI: Katherine [name] is a 89 y.o. female with medical history significant of HFpEF, CAD, CVA, HTN , T2DM, , paroxysmal atrial fibrillation not on oral anticoagulation , hx of SBO who presents with abdominal pain.   Diffuse abdominal and nausea vomiting started yesterday. Last bowel movement yesterday. Has hx of hysterectomy, cholecystectomy and SBO.   In the ED, temp of 98.59F, normotensive on room air.   No leukocytosis. Hgb of 11.8.  Sodium of 143, K of 4, chloride of 112, CO2 of 18, CBG of 205, normal creatinine 0.85.  CBG with hyperglycemia 205.  Lipase and LFTs within normal limits.  CT abdomen pelvis with small bowel obstruction with transition in the mid to distal jejunum within the lower abdomen.  General surgery has evaluated patient and recommended NG tube placement.  Hospitalist then consulted for admission. Review of Systems: As mentioned in the history of present illness. All other systems reviewed and are negative. Past Medical History:  Diagnosis Date   Anemia    mild   Anginal pain (HCC)    Aortic stenosis    Atrial fibrillation (HCC)    Bowel obstruction ([place]) 2015   PSBO   Bowel obstruction ([place]) 10/2017   CAD (coronary artery disease)    Carotid artery stenosis    CHF (congestive heart failure) (HCC)    Chronic kidney disease    renal insufficiency - DR Florene Glen yearly   Diabetes mellitus without complication ([place])    type 2   Full dentures    History of hiatal hernia    Hypertension    Mobility impaired    uses walker for ambulation   Stroke ([place])    right sided weakness,walker   Wears glasses    Past Surgical History:  Procedure Laterality Date   ABDOMINAL HYSTERECTOMY      angiodysplasia     APPENDECTOMY     BREAST DUCTAL SYSTEM EXCISION  03/02/2012   Procedure: EXCISION DUCTAL SYSTEM BREAST;  Surgeon: Adin Hector, MD;  Location: [facility];  Service: General;  Laterality: Left;   BREAST EXCISIONAL BIOPSY     BREAST SURGERY     bx rt and lt    CATARACT EXTRACTION     left   CHOLECYSTECTOMY     COLONOSCOPY N/A 10/15/2012   Procedure: COLONOSCOPY;  Surgeon: Beryle Beams, MD;  [facility];  Service: Endoscopy;  Laterality: N/A;   CYSTOSCOPY WITH BIOPSY N/A 01/24/2020   Procedure: CYSTOSCOPY WITH BLADDER BIOPSY AND FULGERATION;  Surgeon: Robley Fries, MD;  [facility];  Service: Urology;  Laterality: N/A;   DILATION AND CURETTAGE OF UTERUS     DVT     ENTEROSCOPY N/A 11/17/2014   Procedure: ENTEROSCOPY;  Surgeon: Carol Ada, MD;  [facility];  Service: Endoscopy;  Laterality: N/A;   ESOPHAGOGASTRODUODENOSCOPY N/A 09/10/2012   Procedure: ESOPHAGOGASTRODUODENOSCOPY (EGD);  Surgeon: Beryle Beams, MD;  Location: [facility];  Service: Endoscopy;  Laterality: N/A;   EYE SURGERY     right-glaucoma   HOT HEMOSTASIS N/A 09/10/2012   Procedure: HOT HEMOSTASIS (ARGON PLASMA COAGULATION/BICAP);  Surgeon: Beryle Beams, MD;  Location: [facility];  Service: Endoscopy;  Laterality: N/A;  HOT HEMOSTASIS N/A 11/17/2014   Procedure: HOT HEMOSTASIS (ARGON PLASMA COAGULATION/BICAP);  Surgeon: Carol Ada, MD;  Location: [facility];  Service: Endoscopy;  Laterality: N/A;   TUBAL LIGATION     Social History:  reports that she quit smoking about 20 years ago. She has never used smokeless tobacco. She reports that she does not drink alcohol and does not use drugs.  Allergies  Allergen Reactions   Aspirin Hives   Blue Dyes (Parenteral) Hives   Ibuprofen Hives   Contrast Media [Iodinated Contrast Media] Other (See Comments)    Unknown reaction   Iohexol Other (See Comments)    Unknown reaction   Shellfish-Derived  Products Hives    Family History  Problem Relation Age of Onset   Heart disease Sister    Heart disease Brother    Heart disease Mother    Heart disease Father    Heart disease Daughter     Prior to Admission medications   Medication Sig Start Date End Date Taking? Authorizing Provider  Accu-Chek FastClix Lancets MISC daily. 10/18/18   [provider]  ACCU-CHEK GUIDE test strip 1 strip daily. 10/18/18   [provider]  amLODipine-valsartan (EXFORGE) 5-320 MG tablet Take 1 tablet by mouth daily.  01/14/16   [provider]  atorvastatin (LIPITOR) 80 MG tablet Take 1 tablet (80 mg total) by mouth daily. 02/06/20   Swayze, Ava, DO  bimatoprost (LUMIGAN) 0.01 % SOLN Place 1 drop into both eyes at bedtime.    [provider]  Blood Glucose Monitoring Suppl (ACCU-CHEK GUIDE) w/Device KIT daily.  10/18/18   [provider]  brimonidine (ALPHAGAN P) 0.1 % SOLN Place 1 drop into both eyes 3 (three) times daily.     [provider]  clopidogrel (PLAVIX) 75 MG tablet Take 1 tablet (75 mg total) by mouth daily. 06/04/17   [name], MD  dicyclomine (BENTYL) 10 MG capsule Take 10 mg by mouth 3 (three) times daily.  08/09/18   [provider]  dorzolamide (TRUSOPT) 2 % ophthalmic solution Place 1 drop into both eyes 3 (three) times daily.  08/09/18   [provider]  ergocalciferol (VITAMIN D2) 50000 units capsule Take 50,000 Units by mouth every Saturday.     [provider]  Febuxostat 80 MG TABS Take 80 mg by mouth daily.    [provider]  ferrous gluconate (FERGON) 324 MG tablet Take 324 mg by mouth daily. 10/26/18   [provider]  insulin aspart protamine- aspart (NOVOLOG MIX 70/30) (70-30) 100 UNIT/ML injection Inject 0.2 mLs (20 Units total) into the skin 2 (two) times daily with a meal. Inject 20 units subcutaneously every morning and 10 units at night Patient taking differently: Inject 20-45  Units into the skin See admin instructions. Inject 45 units in the morning and 20 units at night 09/10/18   [name], MD  lidocaine (LIDODERM) 5 % Place 1 patch onto the skin daily. Remove & Discard patch within 12 hours or as directed by MD 02/06/20   Swayze, Ava, DO  omeprazole (PRILOSEC) 40 MG capsule Take 1 capsule (40 mg total) by mouth daily. 01/25/20   [name], MD  torsemide (DEMADEX) 20 MG tablet Take 20 mg by mouth daily.    [provider]    Physical Exam: Vitals:   03/17/22 1229 03/17/22 1326 03/17/22 1555 03/17/22 1743  BP: (!) 132/92  129/84   Pulse: 89  81   Resp: 16  18  Temp:  (!) 97.4 F (36.3 C)  98.3 F (36.8 C)  TempSrc:  Oral    SpO2: 98%  100%    Constitutional: NAD, calm, comfortable, nontoxic appearing elderly female sitting upright in bed Eyes: lids and conjunctivae normal ENMT: Mucous membranes are moist.  NG tube in place on low intermittent suction with 200 cc of gastric fluid output  neck: normal, supple Respiratory: clear to auscultation bilaterally, no wheezing, no crackles. Normal respiratory effort. No accessory muscle use.  Cardiovascular: Regular rate and rhythm, no murmurs / rubs / gallops. No extremity edema.   Abdomen: no tenderness,  Bowel sounds positive.  Musculoskeletal: no clubbing / cyanosis. No joint deformity upper and lower extremities. Good ROM, no contractures. Normal muscle tone.  Skin: no rashes, lesions, ulcers. No induration Neurologic: CN 2-12 grossly intact. Strength 5/5 in all 4.  Psychiatric: Normal judgment and insight. Alert and oriented x 3. Normal mood. Data Reviewed:  See HPI  Assessment and Plan: * SBO (small bowel obstruction) (HCC) -hx of remote SBO with recurrence small bowel obstruction with transition in the mid to distal jejunum within the lower abdomen. -NG tube to low intermittent suction.  -Keep NPO. Gentle IV fluid hydration overnight. -General surgery has evaluated and does not  recommend gastrografin due to contrast allergy. No emergent surgery needed.    Paroxysmal atrial fibrillation (HCC) -Rate controlled. Not on anticoagulation  Type 2 diabetes mellitus with complication, with long-term current use of insulin (HCC) -insulin-dependent -place on moderate SSI while NPO  Chronic diastolic CHF (congestive heart failure) (HCC) -euvolemic on exam. Gentle IV fluids while NPO overnight.    Essential hypertension -controlled. Continue home antihypertensive once permitted to have oral intake      Advance Care Planning: Full  Consults: General surgery  Family Communication: none at bedside  Severity of Illness: The appropriate patient status for this patient is OBSERVATION. Observation status is judged to be reasonable and necessary in order to provide the required intensity of service to ensure the patient's safety. The patient's presenting symptoms, physical exam findings, and initial radiographic and laboratory data in the context of their medical condition is felt to place them at decreased risk for further clinical deterioration. Furthermore, it is anticipated that the patient will be medically stable for discharge from the hospital within 2 midnights of admission.   Author: Orene Desanctis, DO 03/17/2022 8:48 PM  For on call review [email or web address].

## 2022-03-17 NOTE — Assessment & Plan Note (Addendum)
-  hx of remote SBO with recurrence small bowel obstruction with transition in the mid to distal jejunum within the lower abdomen. -NG tube to low intermittent suction.  -Keep NPO. Gentle IV fluid hydration overnight. -General surgery has evaluated and does not recommend gastrografin due to contrast allergy. No emergent surgery needed.

## 2022-03-17 NOTE — ED Triage Notes (Signed)
Pt BIB [facility] from home c/o generalized abdominal pain and N/V since yesterday. PT was given '4mg'$  of zofran IM.

## 2022-03-17 NOTE — ED Provider Notes (Signed)
[facility]ERGENCY DEPARTMENT Provider Note   CSN: [id number] Arrival date & time: 03/17/22  7425     History {Add pertinent medical, surgical, social history, OB history to HPI:1} Chief Complaint  Patient presents with   Abdominal Pain    Katherine [name] is a 89 y.o. female with history of IVC filter, hypertension, presenting emergency department with abdominal pain.  Patient reports worsening abdominal pain yesterday evening, around 4 PM.  She says her entire abdomen hurts.  She is nauseated and has been dry heaving and vomiting.  Does not recall her last bowel movement.  She does have a fentanyl patch on (wears chronically)  HPI     Home Medications Prior to Admission medications   Medication Sig Start Date End Date Taking? Authorizing Provider  Accu-Chek FastClix Lancets MISC daily. 10/18/18   [provider]  ACCU-CHEK GUIDE test strip 1 strip daily. 10/18/18   [provider]  amLODipine-valsartan (EXFORGE) 5-320 MG tablet Take 1 tablet by mouth daily.  01/14/16   [provider]  atorvastatin (LIPITOR) 80 MG tablet Take 1 tablet (80 mg total) by mouth daily. 02/06/20   Swayze, Ava, DO  bimatoprost (LUMIGAN) 0.01 % SOLN Place 1 drop into both eyes at bedtime.    [provider]  Blood Glucose Monitoring Suppl (ACCU-CHEK GUIDE) w/Device KIT daily.  10/18/18   [provider]  brimonidine (ALPHAGAN P) 0.1 % SOLN Place 1 drop into both eyes 3 (three) times daily.     [provider]  clopidogrel (PLAVIX) 75 MG tablet Take 1 tablet (75 mg total) by mouth daily. 06/04/17   [name], MD  dicyclomine (BENTYL) 10 MG capsule Take 10 mg by mouth 3 (three) times daily.  08/09/18   [provider]  dorzolamide (TRUSOPT) 2 % ophthalmic solution Place 1 drop into both eyes 3 (three) times daily.  08/09/18   [provider]  ergocalciferol (VITAMIN D2) 50000 units capsule Take 50,000 Units by mouth every  Saturday.     [provider]  Febuxostat 80 MG TABS Take 80 mg by mouth daily.    [provider]  ferrous gluconate (FERGON) 324 MG tablet Take 324 mg by mouth daily. 10/26/18   [provider]  insulin aspart protamine- aspart (NOVOLOG MIX 70/30) (70-30) 100 UNIT/ML injection Inject 0.2 mLs (20 Units total) into the skin 2 (two) times daily with a meal. Inject 20 units subcutaneously every morning and 10 units at night Patient taking differently: Inject 20-45 Units into the skin See admin instructions. Inject 45 units in the morning and 20 units at night 09/10/18   [name], MD  lidocaine (LIDODERM) 5 % Place 1 patch onto the skin daily. Remove & Discard patch within 12 hours or as directed by MD 02/06/20   Swayze, Ava, DO  omeprazole (PRILOSEC) 40 MG capsule Take 1 capsule (40 mg total) by mouth daily. 01/25/20   [name], MD  torsemide (DEMADEX) 20 MG tablet Take 20 mg by mouth daily.    [provider]      Allergies    Aspirin, Blue dyes (parenteral), Ibuprofen, Contrast media [iodinated contrast media], Iohexol, and Shellfish-derived products    Review of Systems   Review of Systems  Physical Exam Updated Vital Signs BP 112/61 (BP Location: Left Arm)   Pulse 82   Temp (!) 97.5 F (36.4 C) (Oral)   Resp (!) 22   SpO2 100%  Physical Exam Constitutional:  General: She is in acute distress.  HENT:     Head: Normocephalic and atraumatic.  Eyes:     Conjunctiva/sclera: Conjunctivae normal.     Pupils: Pupils are equal, round, and reactive to light.  Cardiovascular:     Rate and Rhythm: Normal rate and regular rhythm.  Pulmonary:     Effort: Pulmonary effort is normal. No respiratory distress.  Abdominal:     General: There is no distension.     Tenderness: There is generalized abdominal tenderness.  Skin:    General: Skin is warm and dry.  Neurological:     General: No focal deficit present.     Mental Status: She is alert.  Mental status is at baseline.  Psychiatric:        Mood and Affect: Mood normal.        Behavior: Behavior normal.     ED Results / Procedures / Treatments   Labs (all labs ordered are listed, but only abnormal results are displayed) Labs Reviewed  COMPREHENSIVE METABOLIC PANEL  CBC WITH DIFFERENTIAL/PLATELET  LIPASE, BLOOD  URINALYSIS, ROUTINE W REFLEX MICROSCOPIC    EKG EKG Interpretation  Date/Time:  Monday March 17 2022 08:34:18 EST Ventricular Rate:  76 PR Interval:  158 QRS Duration: 91 QT Interval:  400 QTC Calculation: 450 R Axis:   53 Text Interpretation: Sinus rhythm Atrial premature complex Borderline repolarization abnormality Confirmed by Octaviano Glow [phone]) on 03/17/2022 8:59:38 AM  Radiology No results found.  Procedures Procedures  {Document cardiac monitor, telemetry assessment procedure when appropriate:1}  Medications Ordered in ED Medications  HYDROmorphone (DILAUDID) injection 1 mg (has no administration in time range)  ondansetron (ZOFRAN) injection 4 mg (has no administration in time range)    ED Course/ Medical Decision Making/ A&P   {   Click here for ABCD2, HEART and other calculatorsREFRESH Note before signing :1}                          Medical Decision Making Amount and/or Complexity of Data Reviewed Labs: ordered.  Risk Prescription drug management.   This patient presents to the Emergency Department with complaint of abdominal pain. This involves an extensive number of treatment options, and is a complaint that carries with it a high risk of complications and morbidity.  The differential diagnosis includes, but is not limited to, gastritis vs biliary disease vs peptic ulcer vs constipation vs colitis vs UTI vs other  I ordered, reviewed, and interpreted labs, notable for *** I ordered medication *** for abdominal pain and/or nausea I ordered imaging studies which included ***  I independently visualized and interpreted  imaging which showed *** and the monitor tracing which showed *** Additional history was obtained from EMS Previous records obtained and reviewed showing *** I personally reviewed the patients ECG  which showed sinus rhythm with no acute ischemic findings  After the interventions stated above, I reevaluated the patient and found that they remained clinically stable.  Based on the patient's clinical exam, vital signs, risk factors, and ED testing, I felt that the patient's overall risk of life-threatening emergency such as bowel perforation, surgical emergency, or sepsis was quite low.  I suspect this clinical presentation is most consistent with ***, but explained to the patient that this evaluation was not a definitive diagnostic workup.  I discussed outpatient follow up with primary care provider, and provided specialist office number on the patient's discharge paper if a referral was deemed necessary.  I discussed return precautions with the patient. I felt the patient was clinically stable for discharge.   {Document critical care time when appropriate:1} {Document review of labs and clinical decision tools ie heart score, Chads2Vasc2 etc:1}  {Document your independent review of radiology images, and any outside records:1} {Document your discussion with family members, caretakers, and with consultants:1} {Document social determinants of health affecting pt's care:1} {Document your decision making why or why not admission, treatments were needed:1} Final Clinical Impression(s) / ED Diagnoses Final diagnoses:  None    Rx / DC Orders ED Discharge Orders     None

## 2022-03-17 NOTE — ED Notes (Signed)
Pt has on a purwwick

## 2022-03-17 NOTE — ED Provider Notes (Signed)
  Physical Exam  BP (!) 132/92   Pulse 89   Temp (!) 97.4 F (36.3 C) (Oral)   Resp 16   SpO2 98%   Physical Exam  Procedures  Procedures  ED Course / MDM   Clinical Course as of 03/17/22 1540  Mon Mar 17, 2022  1021 Patient's abdominal pain is significantly improved after the Dilaudid.  She unfortunately does have iodine contrast allergies which she describes as "hives".  Therefore I have ordered premedication with steroids and Solu-Medrol, as I do believe she still requires a CT scan, given the broad differential for abdominal pain and her age. [MT]  1331 I was contacted by the nephrology office as the patient's most recent urine culture come back positive patient not been started on antibiotics.  I ordered Rocephin. [MT]    Clinical Course User Index [MT] Trifan, Carola Rhine, MD   Medical Decision Making Amount and/or Complexity of Data Reviewed Labs: ordered. Radiology: ordered.  Risk Prescription drug management.   PT with chronic abd pain comes in with cc of abd pain. She arrived with severe pain and nausea. She has diffuse abd tenderness. She is getting iv prep for contrast for CT abd.  She has received ceftriaxone for pan sensitive e-coli. VSS and WNL.        Katherine Biles, MD 03/17/22 [phone]

## 2022-03-17 NOTE — Assessment & Plan Note (Addendum)
-  Rate controlled. Not on anticoagulation

## 2022-03-18 ENCOUNTER — Observation Stay (HOSPITAL_COMMUNITY): Payer: Medicare Other

## 2022-03-18 ENCOUNTER — Inpatient Hospital Stay (HOSPITAL_COMMUNITY): Payer: Medicare Other

## 2022-03-18 DIAGNOSIS — K5641 Fecal impaction: Secondary | ICD-10-CM | POA: Diagnosis present

## 2022-03-18 DIAGNOSIS — I350 Nonrheumatic aortic (valve) stenosis: Secondary | ICD-10-CM | POA: Diagnosis present

## 2022-03-18 DIAGNOSIS — K5669 Other partial intestinal obstruction: Secondary | ICD-10-CM | POA: Diagnosis present

## 2022-03-18 DIAGNOSIS — Z794 Long term (current) use of insulin: Secondary | ICD-10-CM | POA: Diagnosis not present

## 2022-03-18 DIAGNOSIS — E1165 Type 2 diabetes mellitus with hyperglycemia: Secondary | ICD-10-CM | POA: Diagnosis not present

## 2022-03-18 DIAGNOSIS — E1122 Type 2 diabetes mellitus with diabetic chronic kidney disease: Secondary | ICD-10-CM | POA: Diagnosis present

## 2022-03-18 DIAGNOSIS — N390 Urinary tract infection, site not specified: Secondary | ICD-10-CM | POA: Diagnosis present

## 2022-03-18 DIAGNOSIS — I480 Paroxysmal atrial fibrillation: Secondary | ICD-10-CM | POA: Diagnosis present

## 2022-03-18 DIAGNOSIS — H409 Unspecified glaucoma: Secondary | ICD-10-CM | POA: Diagnosis present

## 2022-03-18 DIAGNOSIS — Z8673 Personal history of transient ischemic attack (TIA), and cerebral infarction without residual deficits: Secondary | ICD-10-CM | POA: Diagnosis not present

## 2022-03-18 DIAGNOSIS — E11649 Type 2 diabetes mellitus with hypoglycemia without coma: Secondary | ICD-10-CM | POA: Diagnosis not present

## 2022-03-18 DIAGNOSIS — I5032 Chronic diastolic (congestive) heart failure: Secondary | ICD-10-CM | POA: Diagnosis present

## 2022-03-18 DIAGNOSIS — Z9049 Acquired absence of other specified parts of digestive tract: Secondary | ICD-10-CM | POA: Diagnosis not present

## 2022-03-18 DIAGNOSIS — I130 Hypertensive heart and chronic kidney disease with heart failure and stage 1 through stage 4 chronic kidney disease, or unspecified chronic kidney disease: Secondary | ICD-10-CM | POA: Diagnosis present

## 2022-03-18 DIAGNOSIS — N1832 Chronic kidney disease, stage 3b: Secondary | ICD-10-CM | POA: Diagnosis present

## 2022-03-18 DIAGNOSIS — K56609 Unspecified intestinal obstruction, unspecified as to partial versus complete obstruction: Secondary | ICD-10-CM | POA: Diagnosis present

## 2022-03-18 DIAGNOSIS — Z886 Allergy status to analgesic agent status: Secondary | ICD-10-CM | POA: Diagnosis not present

## 2022-03-18 DIAGNOSIS — I2510 Atherosclerotic heart disease of native coronary artery without angina pectoris: Secondary | ICD-10-CM | POA: Diagnosis present

## 2022-03-18 DIAGNOSIS — E785 Hyperlipidemia, unspecified: Secondary | ICD-10-CM | POA: Diagnosis present

## 2022-03-18 DIAGNOSIS — Z95828 Presence of other vascular implants and grafts: Secondary | ICD-10-CM | POA: Diagnosis not present

## 2022-03-18 DIAGNOSIS — Z91041 Radiographic dye allergy status: Secondary | ICD-10-CM | POA: Diagnosis not present

## 2022-03-18 DIAGNOSIS — Z87891 Personal history of nicotine dependence: Secondary | ICD-10-CM | POA: Diagnosis not present

## 2022-03-18 DIAGNOSIS — Z8249 Family history of ischemic heart disease and other diseases of the circulatory system: Secondary | ICD-10-CM | POA: Diagnosis not present

## 2022-03-18 DIAGNOSIS — E876 Hypokalemia: Secondary | ICD-10-CM | POA: Diagnosis not present

## 2022-03-18 DIAGNOSIS — Z90710 Acquired absence of both cervix and uterus: Secondary | ICD-10-CM | POA: Diagnosis not present

## 2022-03-18 LAB — CBG MONITORING, ED
Glucose-Capillary: 143 mg/dL — ABNORMAL HIGH (ref 70–99)
Glucose-Capillary: 106 mg/dL — ABNORMAL HIGH (ref 70–99)

## 2022-03-18 LAB — BASIC METABOLIC PANEL
Anion gap: 9 (ref 5–15)
BUN: 17 mg/dL (ref 8–23)
CO2: 26 mmol/L (ref 22–32)
Calcium: 10.2 mg/dL (ref 8.9–10.3)
Chloride: 109 mmol/L (ref 98–111)
Creatinine, Ser: 0.73 mg/dL (ref 0.44–1.00)
GFR, Estimated: >60 mL/min (ref >60)
Glucose, Bld: 159 mg/dL — ABNORMAL HIGH (ref 70–99)
Potassium: 3.6 mmol/L (ref 3.5–5.1)
Sodium: 144 mmol/L (ref 135–145)

## 2022-03-18 LAB — GLUCOSE, CAPILLARY
Glucose-Capillary: 100 mg/dL — ABNORMAL HIGH (ref 70–99)
Glucose-Capillary: 99 mg/dL (ref 70–99)

## 2022-03-18 MED ORDER — DIATRIZOATE MEGLUMINE & SODIUM 66-10 % PO SOLN
90.0000 mL | Freq: Once | ORAL | Status: AC
  Administered 2022-03-18: 90 mL via NASOGASTRIC
  Filled 2022-03-18: qty 90

## 2022-03-18 MED ORDER — HYDRALAZINE HCL 20 MG/ML IJ SOLN: 10.0000 mg | Freq: Four times a day (QID) | INTRAMUSCULAR | Status: DC | PRN

## 2022-03-18 MED ORDER — SODIUM CHLORIDE 0.45 % IV SOLN: INTRAVENOUS | Status: DC

## 2022-03-18 NOTE — Progress Notes (Signed)
PROGRESS NOTE  Katherine [name]  DOB: [birth year]  PCP: [name], MD LTJ:[id number]  DOA: 03/17/2022  LOS: 0 days  Hospital Day: 2  Brief narrative: Katherine [name] is a 89 y.o. female with PMH significant for DM2, HTN, HLD, paroxysmal A-fib not on anticoagulation, CVA on Plavix, CAD, chronic diastolic CHF, mild to moderate aortic stenosis, CKD, history of cholecystectomy, hysterectomy and subsequent history of SBO. 1/11, patient presented to the ED with complaint of nausea, vomiting, crampy abdominal pain for less than 24 hours. In the ED, patient was afebrile, heart rate in 80s, blood pressure 112/61, breathing on room air Labs with WC count 7.9, hemoglobin 11.8, serum bicarb 18, BUN/creatinine 20/0.85 Lipase and LFTs normal CT abdomen pelvis with small bowel obstruction with transition in the mid to distal jejunum within the lower abdomen. Admitted to [place] surgery consulted  Subjective: Patient was seen and examined this morning.  Pleasant elderly African-American female.  Has NG tube clamped. Chart reviewed Blood pressure elevated to 160s overnight. Repeat labs this morning unremarkable  Assessment and plan: SBO (small bowel obstruction)  History of cholecystectomy, hysterectomy Presented with nausea, vomiting, abdominal pain CT abdomen as above showed SBO General surgery following.  Currently on conservative management with IV fluid, n.p.o. status NG tube suction hold for contrast study this morning.  Type 2 diabetes mellitus A1c 7.4 on 03/17/2022 PTA on insulin 70/30 twice daily (45 units a.m., 20 units p.m.) Currently on sliding scale insulin with Accu-Cheks only.  Continue to monitor Recent Labs  Lab 03/17/22 1726 03/17/22 2226 03/18/22 0906  GLUCAP 193* 177* 143*   Chronic diastolic CHF  essential hypertension Remains euvolemic on exam. PTA on amlodipine 5 mg daily, valsartan 320 mg daily Keep oral meds on hold for now.  Hydralazine IV as  needed.  Mild to moderate aortic valve stenosis Per echocardiogram from 2021. Stable.  Continue outpatient follow-up with cardiology   Paroxysmal atrial fibrillation  Not on any AV nodal blocking agent as an outpatient.  Not on anticoagulation  CAD CVA, HLD PTA on Plavix, Lipitor Keep oral meds on hold.  CKD 3b Creatinine at baseline. Recent Labs    03/17/22 1218 03/18/22 0259  BUN 20 17  CREATININE 0.85 0.73    Mobility: Uses walker at home.  PT eval ordered.  Goals of care   Code Status: Full Code    Mobility: Uses walker at home.  PT eval pending  Goals of care   Code Status: Full Code     DVT prophylaxis:  SCDs Start: 03/17/22 1943   Antimicrobials: None currently Fluid: NS at 75 mill per hour Consultants: General surgery Family Communication: None at bedside  Scheduled Meds:  insulin aspart  0-15 Units Subcutaneous TID WC    PRN meds: hydrALAZINE, morphine injection   Infusions:   sodium chloride 75 mL/hr at 03/18/22 0762    Antimicrobials: Anti-infectives (From admission, onward)    Start     Dose/Rate Route Frequency Ordered Stop   03/17/22 1345  cefTRIAXone (ROCEPHIN) 1 g in sodium chloride 0.9 % 100 mL IVPB        1 g 200 mL/hr over 30 Minutes Intravenous  Once 03/17/22 1331 03/17/22 1415       Skin assessment:      Diet:  Diet Order             Diet NPO time specified Except for: Ice Chips  Diet effective now  Nutritional status:  There is no height or weight on file to calculate BMI.         Status is: Observation Level of care: Telemetry Medical  Continue in-hospital care because: Conservative management of bowel obstruction  Dispo: The patient is from: Home              Anticipated d/c is to: Pending clinical course              Patient currently is not medically stable to d/c.   Difficult to place patient No   Objective: Vitals:   03/18/22 0909 03/18/22 1000  BP:  135/66  Pulse:  72   Resp:  11  Temp: 98.2 F (36.8 C)   SpO2:  100%    Intake/Output Summary (Last 24 hours) at 03/18/2022 1104 Last data filed at 03/18/2022 2440 Gross per 24 hour  Intake 689.82 ml  Output --  Net 689.82 ml   There were no vitals filed for this visit. Weight change:  There is no height or weight on file to calculate BMI.   Physical Exam: General exam: Pleasant, elderly African-American female. Skin: No rashes, lesions or ulcers. HEENT: Atraumatic, normocephalic, no obvious bleeding Lungs: Clear to auscultation bilaterally CVS: Regular rate and rhythm, no murmur GI/Abd soft, distended, mild diffuse tenderness, I did not hear any bowel sounds CNS: Alert, awake, oriented x 3 Psychiatry: Mood appropriate Extremities: No pedal edema, no calf tenderness  Data Review: I have personally reviewed the laboratory data and studies available.  F/u labs ordered Unresulted Labs (From admission, onward)     Start     Ordered   Unscheduled  Basic metabolic panel  Tomorrow morning,   R        03/18/22 1104   Unscheduled  CBC with Differential/Platelet  Tomorrow morning,   R        03/18/22 1104   Unscheduled  Magnesium  Tomorrow morning,   R        03/18/22 1104   Unscheduled  Phosphorus  Tomorrow morning,   R        03/18/22 1104            Total time spent in review of labs and imaging, patient evaluation, formulation of plan, documentation and communication with family:  80 minutes  Signed, Terrilee Croak, MD [facility] 03/18/2022

## 2022-03-18 NOTE — ED Notes (Signed)
ED TO INPATIENT HANDOFF REPORT  ED Nurse Name and Phone #: Vanetta Shawl [phone]  S Name/Age/Gender Katherine [name] 89 y.o. female Room/Bed: 037C/037C  Code Status   Code Status: Full Code  Home/SNF/Other Home Patient oriented to: self, place, time, and situation Is this baseline? Yes   Triage Complete: Triage complete  Chief Complaint SBO (small bowel obstruction) ([place]) [K56.609]  Triage Note Pt BIB [facility] from home c/o generalized abdominal pain and N/V since yesterday. PT was given '4mg'$  of zofran IM.    Allergies Allergies  Allergen Reactions   Aspirin Hives   Blue Dyes (Parenteral) Hives   Ibuprofen Hives   Contrast Media [Iodinated Contrast Media] Other (See Comments)    Unknown reaction   Iohexol Other (See Comments)    Unknown reaction   Shellfish-Derived Products Hives    Level of Care/Admitting Diagnosis ED Disposition     ED Disposition  Admit   Condition  --   Comment  Hospital Area: [facility] [[id number]]  Level of Care: Telemetry Medical [104]  May place patient in observation at [facility] or [facility] if equivalent level of care is available:: No  Covid Evaluation: Asymptomatic - no recent exposure (last 10 days) testing not required  Diagnosis: SBO (small bowel obstruction) [facility]) [[id number]]  Admitting Physician: Orene Desanctis [[id number]]  Attending Physician: Orene Desanctis [[id number]]          B Medical/Surgery History Past Medical History:  Diagnosis Date   Anemia    mild   Anginal pain ([place])    Aortic stenosis    Atrial fibrillation ([place])    Bowel obstruction ([place]) 2015   PSBO   Bowel obstruction ([place]) 10/2017   CAD (coronary artery disease)    Carotid artery stenosis    CHF (congestive heart failure) ([place])    Chronic kidney disease    renal insufficiency - DR Florene Glen yearly   Diabetes mellitus without complication ([place])    type 2   Full dentures    History of hiatal hernia    Hypertension    Mobility impaired     uses walker for ambulation   Stroke ([place])    right sided weakness,walker   Wears glasses    Past Surgical History:  Procedure Laterality Date   ABDOMINAL HYSTERECTOMY     angiodysplasia     APPENDECTOMY     BREAST DUCTAL SYSTEM EXCISION  03/02/2012   Procedure: EXCISION DUCTAL SYSTEM BREAST;  Surgeon: Adin Hector, MD;  Location: [facility];  Service: General;  Laterality: Left;   BREAST EXCISIONAL BIOPSY     BREAST SURGERY     bx rt and lt    CATARACT EXTRACTION     left   CHOLECYSTECTOMY     COLONOSCOPY N/A 10/15/2012   Procedure: COLONOSCOPY;  Surgeon: Beryle Beams, MD;  Location: [facility];  Service: Endoscopy;  Laterality: N/A;   CYSTOSCOPY WITH BIOPSY N/A 01/24/2020   Procedure: CYSTOSCOPY WITH BLADDER BIOPSY AND FULGERATION;  Surgeon: Robley Fries, MD;  Location: [facility];  Service: Urology;  Laterality: N/A;   DILATION AND CURETTAGE OF UTERUS     DVT     ENTEROSCOPY N/A 11/17/2014   Procedure: ENTEROSCOPY;  Surgeon: Carol Ada, MD;  Location: [facility];  Service: Endoscopy;  Laterality: N/A;   ESOPHAGOGASTRODUODENOSCOPY N/A 09/10/2012   Procedure: ESOPHAGOGASTRODUODENOSCOPY (EGD);  Surgeon: Beryle Beams, MD;  Location: [facility];  Service: Endoscopy;  Laterality: N/A;   EYE  SURGERY     right-glaucoma   HOT HEMOSTASIS N/A 09/10/2012   Procedure: HOT HEMOSTASIS (ARGON PLASMA COAGULATION/BICAP);  Surgeon: Beryle Beams, MD;  Location: [facility];  Service: Endoscopy;  Laterality: N/A;   HOT HEMOSTASIS N/A 11/17/2014   Procedure: HOT HEMOSTASIS (ARGON PLASMA COAGULATION/BICAP);  Surgeon: Carol Ada, MD;  Location: [facility];  Service: Endoscopy;  Laterality: N/A;   TUBAL LIGATION       A IV Location/Drains/Wounds Patient Lines/Drains/Airways Status     Active Line/Drains/Airways     Name Placement date Placement time Site Days   Peripheral IV 03/17/22 20 G Right Antecubital 03/17/22  0912  Antecubital  1   NG/OG Vented/Dual  Lumen 14 Fr. Left nare Marking at nare/corner of mouth 03/17/22  1916  Left nare  1            Intake/Output Last 24 hours  Intake/Output Summary (Last 24 hours) at 03/18/2022 1312 Last data filed at 03/18/2022 0907 Gross per 24 hour  Intake 689.82 ml  Output --  Net 689.82 ml    Labs/Imaging Results for orders placed or performed during the hospital encounter of 03/17/22 (from the past 48 hour(s))  CBC with Differential     Status: Abnormal   Collection Time: 03/17/22  9:15 AM  Result Value Ref Range   WBC 7.9 4.0 - 10.5 K/uL   RBC 4.03 3.87 - 5.11 MIL/uL   Hemoglobin 11.8 (L) 12.0 - 15.0 g/dL   HCT 36.2 36.0 - 46.0 %   MCV 89.8 80.0 - 100.0 fL   MCH 29.3 26.0 - 34.0 pg   MCHC 32.6 30.0 - 36.0 g/dL   RDW 14.8 11.5 - 15.5 %   Platelets 297 150 - 400 K/uL   nRBC 0.0 0.0 - 0.2 %   Neutrophils Relative % 88 %   Neutro Abs 6.9 1.7 - 7.7 K/uL   Lymphocytes Relative 8 %   Lymphs Abs 0.6 (L) 0.7 - 4.0 K/uL   Monocytes Relative 4 %   Monocytes Absolute 0.3 0.1 - 1.0 K/uL   Eosinophils Relative 0 %   Eosinophils Absolute 0.0 0.0 - 0.5 K/uL   Basophils Relative 0 %   Basophils Absolute 0.0 0.0 - 0.1 K/uL   Immature Granulocytes 0 %   Abs Immature Granulocytes 0.03 0.00 - 0.07 K/uL    Comment: Performed at [facility], [address] [place],  [address]  Comprehensive metabolic panel     Status: Abnormal   Collection Time: 03/17/22 12:18 PM  Result Value Ref Range   Sodium 143 135 - 145 mmol/L   Potassium 4.0 3.5 - 5.1 mmol/L   Chloride 112 (H) 98 - 111 mmol/L   CO2 18 (L) 22 - 32 mmol/L   Glucose, Bld 205 (H) 70 - 99 mg/dL    Comment: Glucose reference range applies only to samples taken after fasting for at least 8 hours.   BUN 20 8 - 23 mg/dL   Creatinine, Ser 0.85 0.44 - 1.00 mg/dL   Calcium 8.4 (L) 8.9 - 10.3 mg/dL   Total Protein 5.8 (L) 6.5 - 8.1 g/dL   Albumin 3.2 (L) 3.5 - 5.0 g/dL   AST 26 15 - 41 U/L   ALT 13 0 - 44 U/L   Alkaline Phosphatase  66 38 - 126 U/L   Total Bilirubin 0.7 0.3 - 1.2 mg/dL   GFR, Estimated >60 >60 mL/min    Comment: (NOTE) Calculated using the CKD-EPI Creatinine Equation (  2021)    Anion gap 13 5 - 15    Comment: Performed at [facility], [place] [address] [place], [place] [address]  Lipase, blood     Status: None   Collection Time: 03/17/22 12:18 PM  Result Value Ref Range   Lipase 32 11 - 51 U/L    Comment: HEMOLYSIS AT THIS LEVEL MAY AFFECT RESULT Performed at [facility], [place] [address] [place], [place] [address]   Urinalysis, Routine w reflex microscopic Urine, Clean Catch     Status: Abnormal   Collection Time: 03/17/22 12:28 PM  Result Value Ref Range   Color, Urine YELLOW YELLOW   APPearance CLEAR CLEAR   Specific Gravity, Urine 1.015 1.005 - 1.030   pH 5.5 5.0 - 8.0   Glucose, UA 100 (A) NEGATIVE mg/dL   Hgb urine dipstick TRACE (A) NEGATIVE   Bilirubin Urine NEGATIVE NEGATIVE   Ketones, ur NEGATIVE NEGATIVE mg/dL   Protein, ur 30 (A) NEGATIVE mg/dL   Nitrite NEGATIVE NEGATIVE   Leukocytes,Ua SMALL (A) NEGATIVE    Comment: Performed at [facility] [address], [place], [place] [address]  Urinalysis, Microscopic (reflex)     Status: Abnormal   Collection Time: 03/17/22 12:28 PM  Result Value Ref Range   RBC / HPF 0-5 0 - 5 RBC/hpf   WBC, UA 6-10 0 - 5 WBC/hpf   Bacteria, UA MANY (A) NONE SEEN   Squamous Epithelial / HPF 0-5 0 - 5 /HPF    Comment: Performed at [facility], [place] [address], [place], [place] [address]  CBG monitoring, ED     Status: Abnormal   Collection Time: 03/17/22  5:26 PM  Result Value Ref Range   Glucose-Capillary 193 (H) 70 - 99 mg/dL    Comment: Glucose reference range applies only to samples taken after fasting for at least 8 hours.  Hemoglobin A1c     Status: Abnormal   Collection Time: 03/17/22  8:51 PM  Result Value Ref Range   Hgb A1c MFr Bld 7.4 (H) 4.8 - 5.6 %    Comment: (NOTE) Pre diabetes:           5.7%-6.4%  Diabetes:              >6.4%  Glycemic control for   <7.0% adults with diabetes    Mean Plasma Glucose 165.68 mg/dL    Comment: Performed at [facility] [address], [place], [place] [address]  CBG monitoring, ED     Status: Abnormal   Collection Time: 03/17/22 10:26 PM  Result Value Ref Range   Glucose-Capillary 177 (H) 70 - 99 mg/dL    Comment: Glucose reference range applies only to samples taken after fasting for at least 8 hours.  Basic metabolic panel     Status: Abnormal   Collection Time: 03/18/22  2:59 AM  Result Value Ref Range   Sodium 144 135 - 145 mmol/L   Potassium 3.6 3.5 - 5.1 mmol/L   Chloride 109 98 - 111 mmol/L   CO2 26 22 - 32 mmol/L   Glucose, Bld 159 (H) 70 - 99 mg/dL    Comment: Glucose reference range applies only to samples taken after fasting for at least 8 hours.   BUN 17 8 - 23 mg/dL   Creatinine, Ser 0.73 0.44 - 1.00 mg/dL   Calcium 10.2 8.9 - 10.3 mg/dL   GFR, Estimated >60 >60 mL/min  Comment: (NOTE) Calculated using the CKD-EPI Creatinine Equation (2021)    Anion gap 9 5 - 15    Comment: Performed at [facility], [place] [address], [place], [place] [address]  CBG monitoring, ED     Status: Abnormal   Collection Time: 03/18/22  9:06 AM  Result Value Ref Range   Glucose-Capillary 143 (H) 70 - 99 mg/dL    Comment: Glucose reference range applies only to samples taken after fasting for at least 8 hours.  CBG monitoring, ED     Status: Abnormal   Collection Time: 03/18/22 12:47 PM  Result Value Ref Range   Glucose-Capillary 106 (H) 70 - 99 mg/dL    Comment: Glucose reference range applies only to samples taken after fasting for at least 8 hours.   DG Abd Portable 1V  Result Date: 03/18/2022 CLINICAL DATA:  Small-bowel obstruction. Nasogastric tube placement. EXAM: PORTABLE ABDOMEN - 1 VIEW COMPARISON:  Radiographs 03/17/2022.  CT 03/17/2022. FINDINGS: 0932 hours. Enteric tube appears unchanged, projecting over the  left subphrenic area, likely in the proximal stomach. Side hole is near the gastroesophageal junction. Unchanged dilated small bowel in the left mid abdomen. IVC filter noted. IMPRESSION: Enteric tube appears unchanged in position, likely in the proximal stomach. Electronically Signed   By: Richardean Sale M.D.   On: 03/18/2022 09:50   DG Abd Portable 1 View  Result Date: 03/17/2022 CLINICAL DATA:  NG tube placement EXAM: PORTABLE ABDOMEN - 1 VIEW limited for tube placement COMPARISON:  03/17/2022 at 5:36 p.m. Eastern standard time FINDINGS: Interval advancement of the enteric tube now with tip more towards the fundus and side hole overlying the upper stomach. Persistent air-filled dilated loops of bowel elsewhere. Limited x-ray for tube placement IMPRESSION: Interval advancement of the NG tube now with side hole overlying the upper stomach Electronically Signed   By: Jill Side M.D.   On: 03/17/2022 19:06   DG Abd Portable 1 View  Result Date: 03/17/2022 CLINICAL DATA:  NG tube placement EXAM: PORTABLE ABDOMEN - 1 VIEW COMPARISON:  CT 03/17/2022 earlier FINDINGS: Limited portable x-ray of the lower chest and upper abdomen demonstrates enteric tube in place with the side hole at the GE junction. This could be advanced further into the stomach. Otherwise there is gas along prominent loops of bowel in the upper abdomen. Please correlate with prior CT. Mild left basilar atelectasis or scar IMPRESSION: Enteric tube with side hole at the GE junction. This could be advanced further into the stomach. Limited x-ray for tube placement Electronically Signed   By: Jill Side M.D.   On: 03/17/2022 17:41   CT ABDOMEN PELVIS W CONTRAST  Result Date: 03/17/2022 CLINICAL DATA:  Generalized abdominal pain, nausea and vomiting since yesterday EXAM: CT ABDOMEN AND PELVIS WITH CONTRAST TECHNIQUE: Multidetector CT imaging of the abdomen and pelvis was performed using the standard protocol following bolus administration  of intravenous contrast. RADIATION DOSE REDUCTION: This exam was performed according to the departmental dose-optimization program which includes automated exposure control, adjustment of the mA and/or kV according to patient size and/or use of iterative reconstruction technique. CONTRAST:  71m OMNIPAQUE IOHEXOL 350 MG/ML SOLN COMPARISON:  11/29/2019 FINDINGS: Lower chest: No acute pleural or parenchymal lung disease. Hepatobiliary: No focal liver abnormality is seen. Status post cholecystectomy. No biliary dilatation. Pancreas: Unremarkable. No pancreatic ductal dilatation or surrounding inflammatory changes. Spleen: Normal in size without focal abnormality. Adrenals/Urinary Tract: Gas in the bladder lumen, please correlate with any recent catheterization. No  bladder wall thickening or perivesicular fat stranding. The kidneys enhance normally and symmetrically. No urinary tract calculi or obstruction. The adrenals are unremarkable. Stomach/Bowel: Distended proximal small bowel measuring up to 3 cm in diameter, with numerous gas fluid levels. Transition within the mid to distal jejunum in the lower central abdomen, consistent with small-bowel obstruction. Fluid-filled esophagus may reflect reflux. Vascular/Lymphatic: Aortic atherosclerosis. IVC filter. No pathologic adenopathy. Reproductive: Status post hysterectomy. No adnexal masses. Other: No free fluid or free intraperitoneal gas. No abdominal wall hernia. Musculoskeletal: No acute or destructive bony lesions. Reconstructed images demonstrate no additional findings. IMPRESSION: 1. Small bowel obstruction, with transition in the mid to distal jejunum within the lower abdomen. 2. Fluid within the distal esophagus may reflect reflux. 3.  Aortic Atherosclerosis (ICD10-I70.0). 4. Gas in the bladder lumen, please correlate with any recent catheterization. Electronically Signed   By: Randa Ngo M.D.   On: 03/17/2022 16:34    Pending Labs Unresulted Labs (From  admission, onward)     Start     Ordered   03/19/22 5573  Basic metabolic panel  Tomorrow morning,   R        03/18/22 1104   03/19/22 0500  CBC with Differential/Platelet  Tomorrow morning,   R        03/18/22 1104   03/19/22 0500  Magnesium  Tomorrow morning,   R        03/18/22 1104   03/19/22 0500  Phosphorus  Tomorrow morning,   R        03/18/22 1104            Vitals/Pain Today's Vitals   03/18/22 1000 03/18/22 1015 03/18/22 1100 03/18/22 1200  BP: 135/66  (!) 143/71 139/72  Pulse: 72  68 65  Resp: '11  15 11  '$ Temp:      TempSrc:      SpO2: 100%  100% 100%  PainSc:  3       Isolation Precautions No active isolations  Medications Medications  morphine (PF) 2 MG/ML injection 2 mg (2 mg Intravenous Given 03/18/22 0924)  insulin aspart (novoLOG) injection 0-15 Units ( Subcutaneous Not Given 03/18/22 1252)  0.9 %  sodium chloride infusion (0 mLs Intravenous Stopped 03/18/22 0907)  0.45 % sodium chloride infusion ( Intravenous New Bag/Given 03/18/22 0925)  hydrALAZINE (APRESOLINE) injection 10 mg (has no administration in time range)  HYDROmorphone (DILAUDID) injection 1 mg (1 mg Intravenous Given 03/17/22 0920)  ondansetron (ZOFRAN) injection 4 mg (4 mg Intravenous Given 03/17/22 0919)  methylPREDNISolone sodium succinate (SOLU-MEDROL) 40 mg/mL injection 40 mg (40 mg Intravenous Given 03/17/22 1033)  diphenhydrAMINE (BENADRYL) capsule 50 mg (50 mg Oral Given 03/17/22 1318)    Or  diphenhydrAMINE (BENADRYL) injection 50 mg ( Intravenous See Alternative 03/17/22 1318)  sodium chloride 0.9 % bolus 500 mL (0 mLs Intravenous Stopped 03/17/22 1217)  cefTRIAXone (ROCEPHIN) 1 g in sodium chloride 0.9 % 100 mL IVPB (0 g Intravenous Stopped 03/17/22 1415)  HYDROmorphone (DILAUDID) injection 0.5 mg (0.5 mg Intravenous Given 03/17/22 1343)  iohexol (OMNIPAQUE) 350 MG/ML injection 75 mL (75 mLs Intravenous Contrast Given 03/17/22 1619)  diatrizoate meglumine-sodium (GASTROGRAFIN) 66-10 %  solution 90 mL (90 mLs Per NG tube Given 03/18/22 1016)    Mobility walks with person assist     Focused Assessments    R Recommendations: See Admitting Provider Note  Report given to:   Additional Notes: NG hooked to intermittent suction and patient is on purewick

## 2022-03-18 NOTE — ED Notes (Signed)
Low intermittent suction of NG tube resumed.

## 2022-03-18 NOTE — ED Notes (Signed)
Suction held at this time for contrast study.

## 2022-03-18 NOTE — Progress Notes (Signed)
Progress Note     Subjective: Pt reports abdominal pain this AM, unable to clarify if this is better or worse than on initial presentation. No BM or flatus.   Objective: Vital signs in last 24 hours: Temp:  [97.4 F (36.3 C)-98.3 F (36.8 C)] 98.2 F (36.8 C) (01/16 0909) Pulse Rate:  [64-89] 64 (01/16 0900) Resp:  [12-20] 16 (01/16 0900) BP: (113-164)/(59-97) 113/97 (01/16 0900) SpO2:  [98 %-100 %] 100 % (01/16 0900)    Intake/Output from previous day: No intake/output data recorded. Intake/Output this shift: Total I/O In: 689.8 [I.V.:689.8] Out: -   PE: General: pleasant, WD, overweight female who is laying in bed and appears uncomfortable Heart: RRR  Lungs: Respiratory effort nonlabored Abd: soft, NGT with thin drainage, mild generalized ttp without peritonitis, BS hypoactive, fentanyl patch noted on LLQ   Lab Results:  Recent Labs    03/17/22 0915  WBC 7.9  HGB 11.8*  HCT 36.2  PLT 297   BMET Recent Labs    03/17/22 1218 03/18/22 0259  NA 143 144  K 4.0 3.6  CL 112* 109  CO2 18* 26  GLUCOSE 205* 159*  BUN 20 17  CREATININE 0.85 0.73  CALCIUM 8.4* 10.2   PT/INR No results for input(s): "LABPROT", "INR" in the last 72 hours. CMP     Component Value Date/Time   NA 144 03/18/2022 0259   K 3.6 03/18/2022 0259   CL 109 03/18/2022 0259   CO2 26 03/18/2022 0259   GLUCOSE 159 (H) 03/18/2022 0259   BUN 17 03/18/2022 0259   CREATININE 0.73 03/18/2022 0259   CALCIUM 10.2 03/18/2022 0259   PROT 5.8 (L) 03/17/2022 1218   ALBUMIN 3.2 (L) 03/17/2022 1218   AST 26 03/17/2022 1218   ALT 13 03/17/2022 1218   ALKPHOS 66 03/17/2022 1218   BILITOT 0.7 03/17/2022 1218   GFRNONAA >60 03/18/2022 0259   GFRAA 35 (L) 05/08/2019 2043   Lipase     Component Value Date/Time   LIPASE 32 03/17/2022 1218       Studies/Results: DG Abd Portable 1 View  Result Date: 03/17/2022 CLINICAL DATA:  NG tube placement EXAM: PORTABLE ABDOMEN - 1 VIEW limited for  tube placement COMPARISON:  03/17/2022 at 5:36 p.m. Eastern standard time FINDINGS: Interval advancement of the enteric tube now with tip more towards the fundus and side hole overlying the upper stomach. Persistent air-filled dilated loops of bowel elsewhere. Limited x-ray for tube placement IMPRESSION: Interval advancement of the NG tube now with side hole overlying the upper stomach Electronically Signed   By: Jill Side M.D.   On: 03/17/2022 19:06   DG Abd Portable 1 View  Result Date: 03/17/2022 CLINICAL DATA:  NG tube placement EXAM: PORTABLE ABDOMEN - 1 VIEW COMPARISON:  CT 03/17/2022 earlier FINDINGS: Limited portable x-ray of the lower chest and upper abdomen demonstrates enteric tube in place with the side hole at the GE junction. This could be advanced further into the stomach. Otherwise there is gas along prominent loops of bowel in the upper abdomen. Please correlate with prior CT. Mild left basilar atelectasis or scar IMPRESSION: Enteric tube with side hole at the GE junction. This could be advanced further into the stomach. Limited x-ray for tube placement Electronically Signed   By: Jill Side M.D.   On: 03/17/2022 17:41   CT ABDOMEN PELVIS W CONTRAST  Result Date: 03/17/2022 CLINICAL DATA:  Generalized abdominal pain, nausea and vomiting since yesterday EXAM: CT ABDOMEN  AND PELVIS WITH CONTRAST TECHNIQUE: Multidetector CT imaging of the abdomen and pelvis was performed using the standard protocol following bolus administration of intravenous contrast. RADIATION DOSE REDUCTION: This exam was performed according to the departmental dose-optimization program which includes automated exposure control, adjustment of the mA and/or kV according to patient size and/or use of iterative reconstruction technique. CONTRAST:  87m OMNIPAQUE IOHEXOL 350 MG/ML SOLN COMPARISON:  11/29/2019 FINDINGS: Lower chest: No acute pleural or parenchymal lung disease. Hepatobiliary: No focal liver abnormality is  seen. Status post cholecystectomy. No biliary dilatation. Pancreas: Unremarkable. No pancreatic ductal dilatation or surrounding inflammatory changes. Spleen: Normal in size without focal abnormality. Adrenals/Urinary Tract: Gas in the bladder lumen, please correlate with any recent catheterization. No bladder wall thickening or perivesicular fat stranding. The kidneys enhance normally and symmetrically. No urinary tract calculi or obstruction. The adrenals are unremarkable. Stomach/Bowel: Distended proximal small bowel measuring up to 3 cm in diameter, with numerous gas fluid levels. Transition within the mid to distal jejunum in the lower central abdomen, consistent with small-bowel obstruction. Fluid-filled esophagus may reflect reflux. Vascular/Lymphatic: Aortic atherosclerosis. IVC filter. No pathologic adenopathy. Reproductive: Status post hysterectomy. No adnexal masses. Other: No free fluid or free intraperitoneal gas. No abdominal wall hernia. Musculoskeletal: No acute or destructive bony lesions. Reconstructed images demonstrate no additional findings. IMPRESSION: 1. Small bowel obstruction, with transition in the mid to distal jejunum within the lower abdomen. 2. Fluid within the distal esophagus may reflect reflux. 3.  Aortic Atherosclerosis (ICD10-I70.0). 4. Gas in the bladder lumen, please correlate with any recent catheterization. Electronically Signed   By: MRanda NgoM.D.   On: 03/17/2022 16:34    Anti-infectives: Anti-infectives (From admission, onward)    Start     Dose/Rate Route Frequency Ordered Stop   03/17/22 1345  cefTRIAXone (ROCEPHIN) 1 g in sodium chloride 0.9 % 100 mL IVPB        1 g 200 mL/hr over 30 Minutes Intravenous  Once 03/17/22 1331 03/17/22 1415        Assessment/Plan SBO - CT with sbo with transition in mid to distal jejunum in lower abdomen - NGT inserted overnight, thin drainage - pt reports increased pain this AM and no flatus or BM - allergy to  iodinated contrast - generally ok to give gastrografin per tube in this setting, have ordered for this AM - repeat KUB now to re-eval  FEN: NPO, IVF@ 75 cc/h, NGT to LIWS VTE: SCDs, ok to have SQH or LMWH from surgery standpoint, please hold plavix  ID: rocephin 1/15>> for UTI  - below per TRH -  HTN HFpEF CAD  CVA T2DM Paroxysmal A. Fib Antiplatelet therapy on plavix - hold for now in setting of SBO  LOS: 0 days   I reviewed ED provider notes, hospitalist notes, last 24 h vitals and pain scores, last 48 h intake and output, last 24 h labs and trends, and last 24 h imaging results.     KNorm Parcel PGr[facility]ell [facility] HospitalSur[facility] 03/18/2022, 9:35 AM Please see Amion for pager number during day hours 7:00am-4:30pm

## 2022-03-19 ENCOUNTER — Inpatient Hospital Stay (HOSPITAL_COMMUNITY): Payer: Medicare Other

## 2022-03-19 DIAGNOSIS — K56609 Unspecified intestinal obstruction, unspecified as to partial versus complete obstruction: Secondary | ICD-10-CM | POA: Diagnosis not present

## 2022-03-19 LAB — GLUCOSE, CAPILLARY
Glucose-Capillary: 85 mg/dL (ref 70–99)
Glucose-Capillary: 73 mg/dL (ref 70–99)
Glucose-Capillary: 90 mg/dL (ref 70–99)
Glucose-Capillary: 109 mg/dL — ABNORMAL HIGH (ref 70–99)

## 2022-03-19 MED ORDER — ONDANSETRON HCL 4 MG/2ML IJ SOLN
4.0000 mg | Freq: Four times a day (QID) | INTRAMUSCULAR | Status: DC | PRN
  Administered 2022-03-19 – 2022-03-21 (×2): 4 mg via INTRAVENOUS
  Filled 2022-03-19 (×2): qty 2

## 2022-03-19 MED ORDER — DEXTROSE-NACL 5-0.45 % IV SOLN: INTRAVENOUS | Status: DC

## 2022-03-19 NOTE — Progress Notes (Signed)
Progress Note     Subjective: Denies abdominal pain. No BM or flatus.   Objective: Vital signs in last 24 hours: Temp:  [97.9 F (36.6 C)-98.9 F (37.2 C)] 98.2 F (36.8 C) (01/17 0822) Pulse Rate:  [64-80] 67 (01/17 0822) Resp:  [11-17] 16 (01/17 0822) BP: (113-164)/(66-97) 154/68 (01/17 0822) SpO2:  [99 %-100 %] 100 % (01/17 0822) Last BM Date : 03/15/22  Intake/Output from previous day: 01/16 0701 - 01/17 0700 In: 1156.2 [I.V.:976.2; NG/GT:180] Out: 1300 [Urine:800; Emesis/NG output:500] Intake/Output this shift: No intake/output data recorded.  PE: General: pleasant, WD, overweight female who is laying in bed and appears comfotable Heart: RRR  Lungs: Respiratory effort nonlabored Abd: soft, NGT with thin,  bilious drainage, overall non-tender ("sore" per pt), no guarding or rebound tenderness, there is fullness of the upper abdomen, previous laparotomy and R subcostal incisions noted.   Lab Results:  Recent Labs    03/17/22 0915  WBC 7.9  HGB 11.8*  HCT 36.2  PLT 297   BMET Recent Labs    03/17/22 1218 03/18/22 0259  NA 143 144  K 4.0 3.6  CL 112* 109  CO2 18* 26  GLUCOSE 205* 159*  BUN 20 17  CREATININE 0.85 0.73  CALCIUM 8.4* 10.2   PT/INR No results for input(s): "LABPROT", "INR" in the last 72 hours. CMP     Component Value Date/Time   NA 144 03/18/2022 0259   K 3.6 03/18/2022 0259   CL 109 03/18/2022 0259   CO2 26 03/18/2022 0259   GLUCOSE 159 (H) 03/18/2022 0259   BUN 17 03/18/2022 0259   CREATININE 0.73 03/18/2022 0259   CALCIUM 10.2 03/18/2022 0259   PROT 5.8 (L) 03/17/2022 1218   ALBUMIN 3.2 (L) 03/17/2022 1218   AST 26 03/17/2022 1218   ALT 13 03/17/2022 1218   ALKPHOS 66 03/17/2022 1218   BILITOT 0.7 03/17/2022 1218   GFRNONAA >60 03/18/2022 0259   GFRAA 35 (L) 05/08/2019 2043   Lipase     Component Value Date/Time   LIPASE 32 03/17/2022 1218       Studies/Results: DG Abd Portable 1V-Small Bowel Obstruction  Protocol-initial, 8 hr delay  Result Date: 03/18/2022 CLINICAL DATA:  Small bowel obstruction EXAM: PORTABLE ABDOMEN - 1 VIEW COMPARISON:  Abdominal radiograph dated March 18, 2022 obtained at 9:32 a.m. FINDINGS: Unchanged position of enteric tube with tip in the stomach. Unchanged dilated loops of small bowel. IVC filter noted. Contrast material seen in the large bowel. IMPRESSION: Unchanged dilated loops of small bowel, consistent with history of small-bowel obstruction. Electronically Signed   By: Yetta Glassman M.D.   On: 03/18/2022 18:38   DG Abd Portable 1V  Result Date: 03/18/2022 CLINICAL DATA:  Small-bowel obstruction. Nasogastric tube placement. EXAM: PORTABLE ABDOMEN - 1 VIEW COMPARISON:  Radiographs 03/17/2022.  CT 03/17/2022. FINDINGS: 0932 hours. Enteric tube appears unchanged, projecting over the left subphrenic area, likely in the proximal stomach. Side hole is near the gastroesophageal junction. Unchanged dilated small bowel in the left mid abdomen. IVC filter noted. IMPRESSION: Enteric tube appears unchanged in position, likely in the proximal stomach. Electronically Signed   By: Richardean Sale M.D.   On: 03/18/2022 09:50   DG Abd Portable 1 View  Result Date: 03/17/2022 CLINICAL DATA:  NG tube placement EXAM: PORTABLE ABDOMEN - 1 VIEW limited for tube placement COMPARISON:  03/17/2022 at 5:36 p.m. Eastern standard time FINDINGS: Interval advancement of the enteric tube now with tip more towards the  fundus and side hole overlying the upper stomach. Persistent air-filled dilated loops of bowel elsewhere. Limited x-ray for tube placement IMPRESSION: Interval advancement of the NG tube now with side hole overlying the upper stomach Electronically Signed   By: Jill Side M.D.   On: 03/17/2022 19:06   DG Abd Portable 1 View  Result Date: 03/17/2022 CLINICAL DATA:  NG tube placement EXAM: PORTABLE ABDOMEN - 1 VIEW COMPARISON:  CT 03/17/2022 earlier FINDINGS: Limited portable x-ray of  the lower chest and upper abdomen demonstrates enteric tube in place with the side hole at the GE junction. This could be advanced further into the stomach. Otherwise there is gas along prominent loops of bowel in the upper abdomen. Please correlate with prior CT. Mild left basilar atelectasis or scar IMPRESSION: Enteric tube with side hole at the GE junction. This could be advanced further into the stomach. Limited x-ray for tube placement Electronically Signed   By: Jill Side M.D.   On: 03/17/2022 17:41   CT ABDOMEN PELVIS W CONTRAST  Result Date: 03/17/2022 CLINICAL DATA:  Generalized abdominal pain, nausea and vomiting since yesterday EXAM: CT ABDOMEN AND PELVIS WITH CONTRAST TECHNIQUE: Multidetector CT imaging of the abdomen and pelvis was performed using the standard protocol following bolus administration of intravenous contrast. RADIATION DOSE REDUCTION: This exam was performed according to the departmental dose-optimization program which includes automated exposure control, adjustment of the mA and/or kV according to patient size and/or use of iterative reconstruction technique. CONTRAST:  26m OMNIPAQUE IOHEXOL 350 MG/ML SOLN COMPARISON:  11/29/2019 FINDINGS: Lower chest: No acute pleural or parenchymal lung disease. Hepatobiliary: No focal liver abnormality is seen. Status post cholecystectomy. No biliary dilatation. Pancreas: Unremarkable. No pancreatic ductal dilatation or surrounding inflammatory changes. Spleen: Normal in size without focal abnormality. Adrenals/Urinary Tract: Gas in the bladder lumen, please correlate with any recent catheterization. No bladder wall thickening or perivesicular fat stranding. The kidneys enhance normally and symmetrically. No urinary tract calculi or obstruction. The adrenals are unremarkable. Stomach/Bowel: Distended proximal small bowel measuring up to 3 cm in diameter, with numerous gas fluid levels. Transition within the mid to distal jejunum in the lower  central abdomen, consistent with small-bowel obstruction. Fluid-filled esophagus may reflect reflux. Vascular/Lymphatic: Aortic atherosclerosis. IVC filter. No pathologic adenopathy. Reproductive: Status post hysterectomy. No adnexal masses. Other: No free fluid or free intraperitoneal gas. No abdominal wall hernia. Musculoskeletal: No acute or destructive bony lesions. Reconstructed images demonstrate no additional findings. IMPRESSION: 1. Small bowel obstruction, with transition in the mid to distal jejunum within the lower abdomen. 2. Fluid within the distal esophagus may reflect reflux. 3.  Aortic Atherosclerosis (ICD10-I70.0). 4. Gas in the bladder lumen, please correlate with any recent catheterization. Electronically Signed   By: MRanda NgoM.D.   On: 03/17/2022 16:34    Anti-infectives: Anti-infectives (From admission, onward)    Start     Dose/Rate Route Frequency Ordered Stop   03/17/22 1345  cefTRIAXone (ROCEPHIN) 1 g in sodium chloride 0.9 % 100 mL IVPB        1 g 200 mL/hr over 30 Minutes Intravenous  Once 03/17/22 1331 03/17/22 1415        Assessment/Plan SBO - CT with sbo with transition in mid to distal jejunum in lower abdomen - NGT to LIWS - SBO protocol with contrast in the colon on yesterday's film. Pt continues to have some abdominal distention and no flatus or BM. Repeat KUB. If not improving then she may need surgical exploration tomorrow.  FEN: NPO, IVF@ 75 cc/h, NGT to LIWS VTE: SCDs, ok to have SQH or LMWH from surgery standpoint, please hold plavix  ID: rocephin 1/15>> for UTI  - below per TRH -  HTN HFpEF CAD  CVA T2DM Paroxysmal A. Fib Antiplatelet therapy on plavix - hold for now in setting of SBO  LOS: 1 day   I reviewed ED provider notes, hospitalist notes, last 24 h vitals and pain scores, last 48 h intake and output, last 24 h labs and trends, and last 24 h imaging results.     Jill Alexanders, [facility] Surgery 03/19/2022,  8:46 AM Please see Amion for pager number during day hours 7:00am-4:30pm

## 2022-03-19 NOTE — Progress Notes (Signed)
PROGRESS NOTE  Katherine [name]  DOB: Jul 08, 1930  PCP: [name], MD BHA:[id number]  DOA: 03/17/2022  LOS: 1 day  Hospital Day: 3  Brief narrative: Katherine [name] is a 89 y.o. female with PMH significant for DM2, HTN, HLD, paroxysmal A-fib not on anticoagulation, CVA on Plavix, CAD, chronic diastolic CHF, mild to moderate aortic stenosis, CKD, history of cholecystectomy, hysterectomy and subsequent history of SBO. 1/11, patient presented to the ED with complaint of nausea, vomiting, crampy abdominal pain for less than 24 hours. In the ED, patient was afebrile, heart rate in 80s, blood pressure 112/61, breathing on room air Labs with WC count 7.9, hemoglobin 11.8, serum bicarb 18, BUN/creatinine 20/0.85 Lipase and LFTs normal CT abdomen pelvis with small bowel obstruction with transition in the mid to distal jejunum within the lower abdomen. Admitted to [facility] surgery consulted  Subjective: Patient was seen and examined this morning.  Pleasant elderly African-American female.  Has NG tube clamped. Chart reviewed Blood pressure elevated to 160s overnight. Repeat labs this morning unremarkable  Assessment and plan: SBO (small bowel obstruction)  History of cholecystectomy, hysterectomy Presented with nausea, vomiting, abdominal pain CT abdomen as above showed SBO General surgery following.  Currently on conservative management.  Does not seem to be improving.  May need surgical exploration.  Currently NG tube to suction.  Type 2 diabetes mellitus A1c 7.4 on 03/17/2022 PTA on insulin 70/30 twice daily (45 units a.m., 20 units p.m.) Currently on sliding scale insulin with Accu-Cheks only.   Blood sugar level running low.  Switched NS to D5 NS today.  Continue to monitor Recent Labs  Lab 03/18/22 1247 03/18/22 1607 03/18/22 2042 03/19/22 0845 03/19/22 1149  GLUCAP 106* 100* 99 85 73   Chronic diastolic CHF  essential hypertension Remains euvolemic on exam. PTA on  amlodipine 5 mg daily, valsartan 320 mg daily Currently oral meds are on hold.  Hydralazine IV as needed.  Mild to moderate aortic valve stenosis Per echocardiogram from 2021. Stable. Continue outpatient follow-up with cardiology   Paroxysmal atrial fibrillation  Not on any AV nodal blocking agent as an outpatient.  Not on anticoagulation  CAD CVA, HLD PTA on Plavix, Lipitor Keep oral meds on hold.  CKD 3b Creatinine at baseline. Recent Labs    03/17/22 1218 03/18/22 0259  BUN 20 17  CREATININE 0.85 0.73    Mobility: Uses walker at home.  PT eval ordered.  Goals of care   Code Status: Full Code    DVT prophylaxis:  SCDs Start: 03/17/22 1943   Antimicrobials: None currently Fluid: D5NS at 75 mill per hour to continue Consultants: General surgery Family Communication: None at bedside  Scheduled Meds:  insulin aspart  0-15 Units Subcutaneous TID WC    PRN meds: hydrALAZINE, morphine injection, ondansetron (ZOFRAN) IV   Infusions:   dextrose 5 % and 0.45% NaCl 75 mL/hr at 03/19/22 1233    Antimicrobials: Anti-infectives (From admission, onward)    Start     Dose/Rate Route Frequency Ordered Stop   03/17/22 1345  cefTRIAXone (ROCEPHIN) 1 g in sodium chloride 0.9 % 100 mL IVPB        1 g 200 mL/hr over 30 Minutes Intravenous  Once 03/17/22 1331 03/17/22 1415       Skin assessment:      Diet:  Diet Order             Diet NPO time specified Except for: Ice Chips  Diet effective now  Nutritional status:  There is no height or weight on file to calculate BMI.         Status is: Observation Level of care: Telemetry Medical  Continue in-hospital care because: Conservative management of bowel obstruction  Dispo: The patient is from: Home              Anticipated d/c is to: Pending clinical course              Patient currently is not medically stable to d/c.   Difficult to place patient No   Objective: Vitals:    03/19/22 0535 03/19/22 0822  BP: (!) 164/75 (!) 154/68  Pulse: 72 67  Resp:  16  Temp: 98 F (36.7 C) 98.2 F (36.8 C)  SpO2: 100% 100%    Intake/Output Summary (Last 24 hours) at 03/19/2022 1424 Last data filed at 03/19/2022 5329 Gross per 24 hour  Intake 466.42 ml  Output 1300 ml  Net -833.58 ml   There were no vitals filed for this visit. Weight change:  There is no height or weight on file to calculate BMI.   Physical Exam: General exam: Pleasant, elderly African-American female. Skin: No rashes, lesions or ulcers. HEENT: Atraumatic, normocephalic, no obvious bleeding.  NG tube to suction Lungs: Clear to auscultation bilaterally CVS: Regular rate and rhythm, no murmur GI/Abd soft, distended, mild diffuse tenderness, I did not hear any bowel sounds CNS: Alert, awake, oriented x 3 Psychiatry: Sad affect Extremities: No pedal edema, no calf tenderness  Data Review: I have personally reviewed the laboratory data and studies available.  F/u labs ordered Unresulted Labs (From admission, onward)     Start     Ordered   03/19/22 9242  Basic metabolic panel  Tomorrow morning,   R        03/18/22 1104   03/19/22 0500  CBC with Differential/Platelet  Tomorrow morning,   R        03/18/22 1104   03/19/22 0500  Magnesium  Tomorrow morning,   R        03/18/22 1104   03/19/22 0500  Phosphorus  Tomorrow morning,   R        03/18/22 1104            Total time spent in review of labs and imaging, patient evaluation, formulation of plan, documentation and communication with family:  33 minutes  Signed, Terrilee Croak, MD [facility] 03/19/2022

## 2022-03-19 NOTE — Evaluation (Signed)
Physical Therapy Evaluation Patient Details Name: Katherine [name] MRN: [id number] DOB: 08-Jan-1931 Today's Date: 03/19/2022  History of Present Illness  SYLINA HENION is a 89 y.o. female with medical history significant of HFpEF, CAD, CVA, HTN , T2DM, paroxysmal atrial fibrillation not on oral anticoagulation, hx of hysterectomy, cholecystectomy, hx of SBO who presents with abdominal pain.  Clinical Impression  Patient presents with decreased mobility due to generalized weakness, decreased balance and decreased activity tolerance.  Previously living alone in apartment with aide assisting daily.  Currently hopeful and could return home with aid support and follow up HHPT.  Concern if she has surgery she may need SNF (but pt currently not agreeable), but PT will continue to follow and will update recommendations as needed.      Recommendations for follow up therapy are one component of a multi-disciplinary discharge planning process, led by the attending physician.  Recommendations may be updated based on patient status, additional functional criteria and insurance authorization.  Follow Up Recommendations Home health PT      Assistance Recommended at Discharge Intermittent Supervision/Assistance  Patient can return home with the following  A little help with walking and/or transfers;Assistance with cooking/housework;Assist for transportation;A little help with bathing/dressing/bathroom;Help with stairs or ramp for entrance    Equipment Recommendations None recommended by PT  Recommendations for Other Services       Functional Status Assessment Patient has had a recent decline in their functional status and demonstrates the ability to make significant improvements in function in a reasonable and predictable amount of time.     Precautions / Restrictions Precautions Precautions: Fall Precaution Comments: NGT to LIWS      Mobility  Bed Mobility Overal bed mobility: Needs Assistance Bed  Mobility: Supine to Sit     Supine to sit: Min assist, HOB elevated     General bed mobility comments: cues for using rail and assist for trunk    Transfers Overall transfer level: Needs assistance Equipment used: Rolling walker (2 wheels) Transfers: Sit to/from Stand, Bed to chair/wheelchair/BSC Sit to Stand: Min assist   Step pivot transfers: Min assist       General transfer comment: up to step to [facility] due to pt concerned about urinary incontinence despite just removed purewick; stood from [facility] cues for hand placement and assist for balance    Ambulation/Gait Ambulation/Gait assistance: Min assist Gait Distance (Feet): 12 Feet Assistive device: Rolling walker (2 wheels) Gait Pattern/deviations: Step-to pattern, Step-through pattern, Decreased dorsiflexion - left, Decreased stride length, Decreased dorsiflexion - right, Shuffle       General Gait Details: scooting feet on the floor but improved with cues for foot clearance; slow pace and some assist for walker management  Stairs            Wheelchair Mobility    Modified Rankin (Stroke Patients Only)       Balance Overall balance assessment: Needs assistance Sitting-balance support: Feet supported Sitting balance-Leahy Scale: Fair     Standing balance support: Bilateral upper extremity supported Standing balance-Leahy Scale: Poor Standing balance comment: UE support needed for balance                             Pertinent Vitals/Pain Pain Assessment Pain Assessment: Faces Faces Pain Scale: Hurts little more Pain Location: generalized discomfort Pain Descriptors / Indicators: Discomfort Pain Intervention(s): Monitored during session, Repositioned, Limited activity within patient's tolerance    Home Living Family/patient expects  to be discharged to:: Private residence Living Arrangements: Alone Available Help at Discharge: Available PRN/intermittently;Personal care attendant (comes 7 days  a week, stays 4-6 hours from [place]) Type of Home: Apartment Home Access: Level entry       Home Layout: One level Home Equipment: Grab bars - tub/shower;Hand held shower head;Tub bench;Rolling Walker (2 wheels);Cane - single point;Wheelchair - manual      Prior Function Prior Level of Function : Needs assist             Mobility Comments: uses walker or cane mainly ADLs Comments: help for shower, groceries, meals, etc     Hand Dominance        Extremity/Trunk Assessment   Upper Extremity Assessment Upper Extremity Assessment: Generalized weakness    Lower Extremity Assessment Lower Extremity Assessment: Generalized weakness    Cervical / Trunk Assessment Cervical / Trunk Assessment: Kyphotic  Communication   Communication: No difficulties  Cognition Arousal/Alertness: Awake/alert Behavior During Therapy: WFL for tasks assessed/performed Overall Cognitive Status: History of cognitive impairments - at baseline                                 General Comments: some difficulty with word finding since prior CVA, pt able to relate deficits        General Comments General comments (skin integrity, edema, etc.): RN in room at end of session; replaced purewick and used mesh briefs and pad during ambulation    Exercises     Assessment/Plan    PT Assessment Patient needs continued PT services  PT Problem List Decreased strength;Decreased balance;Decreased knowledge of precautions;Decreased mobility;Decreased knowledge of use of DME;Decreased safety awareness;Decreased activity tolerance       PT Treatment Interventions DME instruction;Functional mobility training;Balance training;Patient/family education;Therapeutic activities;Gait training;Therapeutic exercise    PT Goals (Current goals can be found in the Care Plan section)  Acute Rehab PT Goals Patient Stated Goal: To go home PT Goal Formulation: With patient Time For Goal Achievement:  04/02/22 Potential to Achieve Goals: Fair    Frequency Min 3X/week     Co-evaluation               AM-PAC PT "6 Clicks" Mobility  Outcome Measure Help needed turning from your back to your side while in a flat bed without using bedrails?: A Little Help needed moving from lying on your back to sitting on the side of a flat bed without using bedrails?: A Lot Help needed moving to and from a bed to a chair (including a wheelchair)?: A Little Help needed standing up from a chair using your arms (e.g., wheelchair or bedside chair)?: A Little Help needed to walk in hospital room?: Total Help needed climbing 3-5 steps with a railing? : Total 6 Click Score: 13    End of Session Equipment Utilized During Treatment: Gait belt Activity Tolerance: Patient limited by fatigue Patient left: in chair;with chair alarm set Nurse Communication: Mobility status PT Visit Diagnosis: Other abnormalities of gait and mobility (R26.89);Muscle weakness (generalized) (M62.81)    Time: 5035-4656 PT Time Calculation (min) (ACUTE ONLY): 29 min   Charges:   PT Evaluation $PT Eval Moderate Complexity: 1 Mod PT Treatments $Gait Training: 8-22 mins        Magda Kiel, PT Acute Rehabilitation Services Office:[phone] 03/19/2022   Reginia Naas 03/19/2022, 2:06 PM

## 2022-03-20 ENCOUNTER — Inpatient Hospital Stay (HOSPITAL_COMMUNITY): Payer: Medicare Other

## 2022-03-20 DIAGNOSIS — K56609 Unspecified intestinal obstruction, unspecified as to partial versus complete obstruction: Secondary | ICD-10-CM | POA: Diagnosis not present

## 2022-03-20 LAB — CBC WITH DIFFERENTIAL/PLATELET
Abs Immature Granulocytes: 0.03 10*3/uL (ref 0.00–0.07)
Basophils Absolute: 0.1 10*3/uL (ref 0.0–0.1)
Basophils Relative: 1 %
Eosinophils Absolute: 0.4 10*3/uL (ref 0.0–0.5)
Eosinophils Relative: 5 %
HCT: 33.7 % — ABNORMAL LOW (ref 36.0–46.0)
Hemoglobin: 10.6 g/dL — ABNORMAL LOW (ref 12.0–15.0)
Immature Granulocytes: 0 %
Lymphocytes Relative: 35 %
Lymphs Abs: 2.9 10*3/uL (ref 0.7–4.0)
MCH: 29 pg (ref 26.0–34.0)
MCHC: 31.5 g/dL (ref 30.0–36.0)
MCV: 92.3 fL (ref 80.0–100.0)
Monocytes Absolute: 1 10*3/uL (ref 0.1–1.0)
Monocytes Relative: 12 %
Neutro Abs: 3.9 10*3/uL (ref 1.7–7.7)
Neutrophils Relative %: 47 %
Platelets: 225 10*3/uL (ref 150–400)
RBC: 3.65 MIL/uL — ABNORMAL LOW (ref 3.87–5.11)
RDW: 14.6 % (ref 11.5–15.5)
WBC: 8.2 10*3/uL (ref 4.0–10.5)
nRBC: 0 % (ref 0.0–0.2)

## 2022-03-20 LAB — BASIC METABOLIC PANEL
Anion gap: 12 (ref 5–15)
Anion gap: 10 (ref 5–15)
BUN: 11 mg/dL (ref 8–23)
BUN: 10 mg/dL (ref 8–23)
CO2: 25 mmol/L (ref 22–32)
CO2: 27 mmol/L (ref 22–32)
Calcium: 9.2 mg/dL (ref 8.9–10.3)
Calcium: 9.1 mg/dL (ref 8.9–10.3)
Chloride: 110 mmol/L (ref 98–111)
Chloride: 108 mmol/L (ref 98–111)
Creatinine, Ser: 0.73 mg/dL (ref 0.44–1.00)
Creatinine, Ser: 0.73 mg/dL (ref 0.44–1.00)
GFR, Estimated: >60 mL/min (ref >60)
GFR, Estimated: >60 mL/min (ref >60)
Glucose, Bld: 138 mg/dL — ABNORMAL HIGH (ref 70–99)
Glucose, Bld: 155 mg/dL — ABNORMAL HIGH (ref 70–99)
Potassium: 3.8 mmol/L (ref 3.5–5.1)
Potassium: 3.3 mmol/L — ABNORMAL LOW (ref 3.5–5.1)
Sodium: 147 mmol/L — ABNORMAL HIGH (ref 135–145)
Sodium: 145 mmol/L (ref 135–145)

## 2022-03-20 LAB — MAGNESIUM: Magnesium: 1.8 mg/dL (ref 1.7–2.4)

## 2022-03-20 LAB — GLUCOSE, CAPILLARY
Glucose-Capillary: 127 mg/dL — ABNORMAL HIGH (ref 70–99)
Glucose-Capillary: 122 mg/dL — ABNORMAL HIGH (ref 70–99)
Glucose-Capillary: 60 mg/dL — ABNORMAL LOW (ref 70–99)
Glucose-Capillary: 64 mg/dL — ABNORMAL LOW (ref 70–99)
Glucose-Capillary: 107 mg/dL — ABNORMAL HIGH (ref 70–99)

## 2022-03-20 LAB — PHOSPHORUS: Phosphorus: 2.7 mg/dL (ref 2.5–4.6)

## 2022-03-20 MED ORDER — BISACODYL 10 MG RE SUPP
10.0000 mg | Freq: Once | RECTAL | Status: AC
  Administered 2022-03-20: 10 mg via RECTAL
  Filled 2022-03-20: qty 1

## 2022-03-20 MED ORDER — MORPHINE SULFATE (PF) 2 MG/ML IV SOLN
2.0000 mg | INTRAVENOUS | Status: DC | PRN
  Administered 2022-03-20 – 2022-03-27 (×25): 2 mg via INTRAVENOUS
  Filled 2022-03-20 (×26): qty 1

## 2022-03-20 NOTE — Progress Notes (Signed)
Patients sister called wanting more information about the planned surgery. Gave her number to [facility] who had discussion wit pt this morning.  Levy Sjogren Cel- [place] [phone]

## 2022-03-20 NOTE — Progress Notes (Signed)
PROGRESS NOTE  Katherine [name]  DOB: 06-Jun-1930  PCP: [name], MD IOE:[id number]  DOA: 03/17/2022  LOS: 2 days  Hospital Day: 4  Brief narrative: Katherine [name] is a 89 y.o. female with PMH significant for DM2, HTN, HLD, paroxysmal A-fib not on anticoagulation, CVA on Plavix, CAD, chronic diastolic CHF, mild to moderate aortic stenosis, CKD, history of cholecystectomy, hysterectomy and subsequent history of SBO. 1/11, patient presented to the ED with complaint of nausea, vomiting, crampy abdominal pain for less than 24 hours. In the ED, patient was afebrile, heart rate in 80s, blood pressure 112/61, breathing on room air Labs with WC count 7.9, hemoglobin 11.8, serum bicarb 18, BUN/creatinine 20/0.85 Lipase and LFTs normal CT abdomen pelvis with small bowel obstruction with transition in the mid to distal jejunum within the lower abdomen. Admitted to [facility] [place] surgery consulted  Subjective: Patient was seen and examined this morning.  Lying on bed.  Frustrated because of no improvement in her bowel obstruction.  Anxious about surgery tomorrow.  Assessment and plan: SBO (small bowel obstruction)  History of cholecystectomy, hysterectomy Presented with nausea, vomiting, abdominal pain CT abdomen as above showed SBO General surgery following.  Currently on conservative management.  Does not seem to be improving.  May need surgical exploration.  Currently NG tube to suction.  Tentatively planned for tomorrow  Perioperative risk assessment Patient has history of CAD but currently with no active cardiac symptoms.  Her heart failure seems to be stable as well.  Last EKG from 1/15 showed sinus rhythm with no ST-T wave changes, QTc 450 ms. At this time, further cardiac testing or intervention is not going to alter her perioperative risk. I have discussed this with patient. Because of the absolute medical necessity, the benefit of surgery is more than the risk  Type 2 diabetes  mellitus A1c 7.4 on 03/17/2022 PTA on insulin 70/30 twice daily (45 units a.m., 20 units p.m.) Currently.  N.p.o. status and an episode of hypoglycemia, patient is on D5 NS at 75 mill per hour.  Blood sugar trend as below.  Continue to monitor. Recent Labs  Lab 03/19/22 1149 03/19/22 1712 03/19/22 2036 03/20/22 0820 03/20/22 1212  GLUCAP 73 90 109* 127* 122*   Chronic diastolic CHF  essential hypertension Remains euvolemic on exam. PTA on amlodipine 5 mg daily, valsartan 320 mg daily Currently oral meds are on hold.  Hydralazine IV as needed.  Mild to moderate aortic valve stenosis Per echocardiogram from 2021. Stable. Continue outpatient follow-up with cardiology   Paroxysmal atrial fibrillation  Not on any AV nodal blocking agent as an outpatient.  Not on anticoagulation  CAD CVA, HLD PTA on Plavix, Lipitor Keep oral meds on hold.  CKD 3b Creatinine at baseline. Recent Labs    03/17/22 1218 03/18/22 0259 03/19/22 0515 03/20/22 0904  BUN '20 17 11 10  '$ CREATININE 0.85 0.73 0.73 0.73    Mobility: Uses walker at home.  PT eval   Goals of care   Code Status: Full Code    DVT prophylaxis:  SCDs Start: 03/17/22 1943   Antimicrobials: None currently Fluid: D5NS at 75 mill per hour to continue Consultants: General surgery Family Communication: None at bedside  Scheduled Meds:  insulin aspart  0-15 Units Subcutaneous TID WC    PRN meds: hydrALAZINE, morphine injection, ondansetron (ZOFRAN) IV   Infusions:   dextrose 5 % and 0.45% NaCl 75 mL/hr at 03/19/22 2329    Antimicrobials: Anti-infectives (From admission, onward)  Start     Dose/Rate Route Frequency Ordered Stop   03/17/22 1345  cefTRIAXone (ROCEPHIN) 1 g in sodium chloride 0.9 % 100 mL IVPB        1 g 200 mL/hr over 30 Minutes Intravenous  Once 03/17/22 1331 03/17/22 1415       Skin assessment:      Diet:  Diet Order             Diet NPO time specified Except for: Ice Chips  Diet  effective now                   Nutritional status:  There is no height or weight on file to calculate BMI.         Status is: Observation Level of care: Telemetry Medical  Continue in-hospital care because: Conservative management of bowel obstruction.  Tentative plan of surgery tomorrow  Dispo: The patient is from: Home              Anticipated d/c is to: Pending clinical course              Patient currently is not medically stable to d/c.   Difficult to place patient No   Objective: Vitals:   03/20/22 0437 03/20/22 0819  BP: (!) 156/77 (!) 149/57  Pulse: 71 68  Resp: 17 17  Temp: 98 F (36.7 C) 98.4 F (36.9 C)  SpO2: 100% 100%    Intake/Output Summary (Last 24 hours) at 03/20/2022 1508 Last data filed at 03/20/2022 0800 Gross per 24 hour  Intake 985.89 ml  Output 1300 ml  Net -314.11 ml   There were no vitals filed for this visit. Weight change:  There is no height or weight on file to calculate BMI.   Physical Exam: General exam: Pleasant, elderly African-American female. Skin: No rashes, lesions or ulcers. HEENT: Atraumatic, normocephalic, no obvious bleeding.  NG tube to suction Lungs: Clear to auscultation bilaterally CVS: Regular rate and rhythm, no murmur GI/Abd soft, distended, mild diffuse tenderness, I did not hear any bowel sounds CNS: Alert, awake, oriented x 3 Psychiatry: Sad affect Extremities: No pedal edema, no calf tenderness  Data Review: I have personally reviewed the laboratory data and studies available.  F/u labs ordered Unresulted Labs (From admission, onward)     Start     Ordered   03/21/22 0500  CBC with Differential/Platelet  Tomorrow morning,   R       Question:  Specimen collection method  Answer:  Lab=Lab collect   03/20/22 0805   03/21/22 6045  Basic metabolic panel  Daily,   R     Question:  Specimen collection method  Answer:  Lab=Lab collect   03/20/22 0805            Total time spent in review of labs  and imaging, patient evaluation, formulation of plan, documentation and communication with family:  6 minutes  Signed, Terrilee Croak, MD [facility] 03/20/2022

## 2022-03-20 NOTE — Progress Notes (Signed)
Physical Therapy Treatment Patient Details Name: Katherine [name] MRN: [id number] DOB: [birth year] Today's Date: 03/20/2022   History of Present Illness Katherine [name] is a 89 y.o. female who presents with abdominal pain. CT abdomen pelvis with small bowel obstruction with transition in the mid to distal jejunum within the lower abdomen. NGT placed to suction. General surgery consulted, plan for ex-lap 1/19. PMH: HFpEF, CAD, CVA, HTN, T2DM, paroxysmal atrial fibrillation not on oral anticoagulation, hx of hysterectomy, cholecystectomy, hx of SBO.    PT Comments    Pt received in supine, c/o moderate to severe abdominal discomfort but agreeable to therapy session. A&O x 2-3. Emphasis on bed mobility, seated/standing balance and transfer training. Pt able to take forward/backward steps up over 1" threshold to be weighed on standing scale, but quick to fatigue and unable to progress gait trial in room. NGT remains to suction, bed placed to chair posture to encourage pulmonary clearance and pillows behind her back/neck for support. Pt also c/o RUE edema/discomfort from previous IV that has been removed. Pt continues to benefit from PT services to progress toward functional mobility goals.    Recommendations for follow up therapy are one component of a multi-disciplinary discharge planning process, led by the attending physician.  Recommendations may be updated based on patient status, additional functional criteria and insurance authorization.  Follow Up Recommendations  Home health PT (pending progress post-op)     Assistance Recommended at Discharge Frequent or constant Supervision/Assistance  Patient can return home with the following Assistance with cooking/housework;Assist for transportation;A little help with bathing/dressing/bathroom;Help with stairs or ramp for entrance;A lot of help with walking and/or transfers   Equipment Recommendations  None recommended by PT (TBD)    Recommendations for  Other Services       Precautions / Restrictions Precautions Precautions: Fall Precaution Comments: NGT to LIWS Restrictions Weight Bearing Restrictions: No     Mobility  Bed Mobility Overal bed mobility: Needs Assistance Bed Mobility: Rolling, Sidelying to Sit, Sit to Sidelying Rolling: Min assist Sidelying to sit: Mod assist, HOB elevated     Sit to sidelying: Mod assist, HOB elevated General bed mobility comments: cues for using rail and assist for trunk, log roll for abdominal comfort; increased time/effort    Transfers Overall transfer level: Needs assistance Equipment used: Rolling walker (2 wheels) Transfers: Sit to/from Stand, Bed to chair/wheelchair/BSC Sit to Stand: Mod assist   Step pivot transfers: Mod assist       General transfer comment: EOB>RW and forward/pivotal steps to standing scale (MD put in order for standing weight for pt), then steps backward from scale>bed with RW    Ambulation/Gait               General Gait Details: defer; SBO and abdominal severe pain, see transfers   Stairs             Wheelchair Mobility    Modified Rankin (Stroke Patients Only)       Balance Overall balance assessment: Mild deficits observed, not formally tested         Standing balance support: Reliant on assistive device for balance Standing balance-Leahy Scale: Poor                              Cognition Arousal/Alertness: Awake/alert Behavior During Therapy: Flat affect Overall Cognitive Status: History of cognitive impairments - at baseline  General Comments: some difficulty with word finding since prior CVA, pt able to relate deficits. Pt unable to state correct month, year, day of week, but was able to state recent holiday this week and city/hospital location. Slow processing, needs incremental sequencing cues.        Exercises Other Exercises Other Exercises: supine BLE  AROM: ankle pumps, SAQ x10 reps ea    General Comments General comments (skin integrity, edema, etc.): NGT to suction, appears intact/functioning; RN notified pt needs a new purewick hers appeared dirty. HOB locked at 30* due to NGT in place      Pertinent Vitals/Pain Pain Assessment Pain Assessment: 0-10 Pain Score: 8  Pain Location: abdomen and NGT/nose Pain Descriptors / Indicators: Discomfort, Grimacing, Guarding, Cramping Pain Intervention(s): Limited activity within patient's tolerance, Monitored during session, Repositioned           PT Goals (current goals can now be found in the care plan section) Acute Rehab PT Goals Patient Stated Goal: To go home, less pain. PT Goal Formulation: With patient Time For Goal Achievement: 04/02/22 Progress towards PT goals: Progressing toward goals (slowly)    Frequency    Min 3X/week      PT Plan Current plan remains appropriate (pending progress post-op)       AM-PAC PT "6 Clicks" Mobility   Outcome Measure  Help needed turning from your back to your side while in a flat bed without using bedrails?: A Little Help needed moving from lying on your back to sitting on the side of a flat bed without using bedrails?: A Lot Help needed moving to and from a bed to a chair (including a wheelchair)?: A Lot Help needed standing up from a chair using your arms (e.g., wheelchair or bedside chair)?: A Lot Help needed to walk in hospital room?: Total Help needed climbing 3-5 steps with a railing? : Total 6 Click Score: 11    End of Session Equipment Utilized During Treatment: Gait belt Activity Tolerance: Patient limited by fatigue;Patient limited by pain Patient left: in bed;with call bell/phone within reach;with bed alarm set;Other (comment) (bed in chair posture) Nurse Communication: Mobility status;Other (comment) (pt weight, needs new/clean purewick placed) PT Visit Diagnosis: Other abnormalities of gait and mobility  (R26.89);Muscle weakness (generalized) (M62.81)     Time: 7253-6644 PT Time Calculation (min) (ACUTE ONLY): 24 min  Charges:  $Therapeutic Activity: 23-37 mins                     Kohana Amble P., PTA Acute Rehabilitation Services Secure Chat Preferred 9a-5:30pm Office: [place] 03/20/2022, 7:19 PM

## 2022-03-20 NOTE — Plan of Care (Signed)
  Problem: Education: Goal: Ability to describe self-care measures that may prevent or decrease complications (Diabetes Survival Skills Education) will improve Outcome: Progressing   Problem: Coping: Goal: Ability to adjust to condition or change in health will improve Outcome: Progressing   Problem: Education: Goal: Knowledge of General Education information will improve Description: Including pain rating scale, medication(s)/side effects and non-pharmacologic comfort measures Outcome: Progressing   Problem: Coping: Goal: Level of anxiety will decrease Outcome: Progressing   Problem: Pain Managment: Goal: General experience of comfort will improve Outcome: Progressing   Problem: Safety: Goal: Ability to remain free from injury will improve Outcome: Progressing

## 2022-03-20 NOTE — Progress Notes (Addendum)
Progress Note     Subjective: A&Ox4. Reports increasing abdominal pain this morning. Denies flatus or BM.   Objective: Vital signs in last 24 hours: Temp:  [98 F (36.7 C)-98.4 F (36.9 C)] 98 F (36.7 C) (01/18 0437) Pulse Rate:  [65-71] 71 (01/18 0437) Resp:  [16-17] 17 (01/18 0437) BP: (150-156)/(61-77) 156/77 (01/18 0437) SpO2:  [99 %-100 %] 100 % (01/18 0437) Last BM Date : 03/15/22  Intake/Output from previous day: 01/17 0701 - 01/18 0700 In: 985.9 [I.V.:985.9] Out: 1300 [Urine:600; Emesis/NG output:700] Intake/Output this shift: No intake/output data recorded.  PE: General: pleasant, WD, overweight female who is laying in bed and appears comfotable Heart: RRR  Lungs: Respiratory effort nonlabored Abd: soft, NGT with thin, clear bilious drainage, TTP in the central abdomen and left hemiabdomen without rebound tenderness or guarding, there is fullness of the upper abdomen, previous laparotomy and R subcostal incisions noted.   Lab Results:  Recent Labs    03/17/22 0915 03/19/22 0515  WBC 7.9 8.2  HGB 11.8* 10.6*  HCT 36.2 33.7*  PLT 297 225   BMET Recent Labs    03/18/22 0259 03/19/22 0515  NA 144 147*  K 3.6 3.8  CL 109 110  CO2 26 25  GLUCOSE 159* 138*  BUN 17 11  CREATININE 0.73 0.73  CALCIUM 10.2 9.2   PT/INR No results for input(s): "LABPROT", "INR" in the last 72 hours. CMP     Component Value Date/Time   NA 147 (H) 03/19/2022 0515   K 3.8 03/19/2022 0515   CL 110 03/19/2022 0515   CO2 25 03/19/2022 0515   GLUCOSE 138 (H) 03/19/2022 0515   BUN 11 03/19/2022 0515   CREATININE 0.73 03/19/2022 0515   CALCIUM 9.2 03/19/2022 0515   PROT 5.8 (L) 03/17/2022 1218   ALBUMIN 3.2 (L) 03/17/2022 1218   AST 26 03/17/2022 1218   ALT 13 03/17/2022 1218   ALKPHOS 66 03/17/2022 1218   BILITOT 0.7 03/17/2022 1218   GFRNONAA >60 03/19/2022 0515   GFRAA 35 (L) 05/08/2019 2043   Lipase     Component Value Date/Time   LIPASE 32 03/17/2022 1218        Studies/Results: DG Abd Portable 1V  Result Date: 03/19/2022 CLINICAL DATA:  Small-bowel obstruction, follow-up EXAM: PORTABLE ABDOMEN - 1 VIEW COMPARISON:  Portable exam 0849 hours compared to 03/18/2022 FINDINGS: Retained contrast in colon. Decreased small bowel distension. No bowel wall thickening. Gas present to rectum. Nasogastric tube in proximal stomach. Extensive soft tissue calcifications in the buttocks bilaterally. Osseous demineralization. IVC filter noted. IMPRESSION: Decreased small bowel distension. Electronically Signed   By: Lavonia Dana M.D.   On: 03/19/2022 08:57   DG Abd Portable 1V-Small Bowel Obstruction Protocol-initial, 8 hr delay  Result Date: 03/18/2022 CLINICAL DATA:  Small bowel obstruction EXAM: PORTABLE ABDOMEN - 1 VIEW COMPARISON:  Abdominal radiograph dated March 18, 2022 obtained at 9:32 a.m. FINDINGS: Unchanged position of enteric tube with tip in the stomach. Unchanged dilated loops of small bowel. IVC filter noted. Contrast material seen in the large bowel. IMPRESSION: Unchanged dilated loops of small bowel, consistent with history of small-bowel obstruction. Electronically Signed   By: Yetta Glassman M.D.   On: 03/18/2022 18:38   DG Abd Portable 1V  Result Date: 03/18/2022 CLINICAL DATA:  Small-bowel obstruction. Nasogastric tube placement. EXAM: PORTABLE ABDOMEN - 1 VIEW COMPARISON:  Radiographs 03/17/2022.  CT 03/17/2022. FINDINGS: 0932 hours. Enteric tube appears unchanged, projecting over the left subphrenic area, likely  in the proximal stomach. Side hole is near the gastroesophageal junction. Unchanged dilated small bowel in the left mid abdomen. IVC filter noted. IMPRESSION: Enteric tube appears unchanged in position, likely in the proximal stomach. Electronically Signed   By: Richardean Sale M.D.   On: 03/18/2022 09:50    Anti-infectives: Anti-infectives (From admission, onward)    Start     Dose/Rate Route Frequency Ordered Stop   03/17/22  1345  cefTRIAXone (ROCEPHIN) 1 g in sodium chloride 0.9 % 100 mL IVPB        1 g 200 mL/hr over 30 Minutes Intravenous  Once 03/17/22 1331 03/17/22 1415        Assessment/Plan SBO - CT with sbo with transition in mid to distal jejunum in lower abdomen - NGT to LIWS - SBO protocol with contrast in the colon on yesterday's film. Despite contrast progressing to the colon, patient has increased pain and fullness of her abdomen just above the umbilicus. She has passed no gas and had not BMs. Clinically she remains obstructed with increasing pain. No peritonitis. No emergent surgical needs this morning but, if she is not improved by this afternoon I think she will need exploratory laparotomy later today. KUB pending.  Pt w/ history of CVA, afib, CHF, mild-mod aortic stenosis her cardiologist is Dr. Terrence Dupont  Discussed the risks of surgery including bleeding, infection, damage to surrounding structures, prolonged hospital stay, need for SNF/Rehab after surgery, as well as the risks of cardiac complication, CVA, pulmonary complication, or death, and she would like to have surgery. States she wants to live.   Pt w/ no sxs of ACS, no chest pain, SOB, palpitations. Suspect there is not much to do to further optimize her for surgery but will discuss with the medical service.    FEN: NPO, IVF@ 75 cc/h, NGT to LIWS VTE: SCDs, ok to have SQH or LMWH from surgery standpoint, please hold plavix  ID: rocephin 1/15>> for UTI  - below per TRH -  HTN HFpEF CAD  CVA T2DM Paroxysmal A. Fib Antiplatelet therapy on plavix - hold for now in setting of SBO  LOS: 2 days   I reviewed ED provider notes, hospitalist notes, last 24 h vitals and pain scores, last 48 h intake and output, last 24 h labs and trends, and last 24 h imaging results.     Jill Alexanders, [facility] Surgery 03/20/2022, 7:42 AM Please see Amion for pager number during day hours 7:00am-4:30pm

## 2022-03-20 NOTE — TOC CM/SW Note (Addendum)
  Transition of Care Department [facility])  will continue to monitor patient advancement through interdisciplinary progression rounds.   Patient for possible surgery tomorrow  , will await clinical course . TOC continues to follow

## 2022-03-21 ENCOUNTER — Inpatient Hospital Stay (HOSPITAL_COMMUNITY): Payer: Medicare Other

## 2022-03-21 DIAGNOSIS — K56609 Unspecified intestinal obstruction, unspecified as to partial versus complete obstruction: Secondary | ICD-10-CM | POA: Diagnosis not present

## 2022-03-21 LAB — CBC WITH DIFFERENTIAL/PLATELET
Abs Immature Granulocytes: 0.03 10*3/uL (ref 0.00–0.07)
Basophils Absolute: 0 10*3/uL (ref 0.0–0.1)
Basophils Relative: 0 %
Eosinophils Absolute: 0.1 10*3/uL (ref 0.0–0.5)
Eosinophils Relative: 2 %
HCT: 32 % — ABNORMAL LOW (ref 36.0–46.0)
Hemoglobin: 10.2 g/dL — ABNORMAL LOW (ref 12.0–15.0)
Immature Granulocytes: 0 %
Lymphocytes Relative: 24 %
Lymphs Abs: 1.6 10*3/uL (ref 0.7–4.0)
MCH: 28.7 pg (ref 26.0–34.0)
MCHC: 31.9 g/dL (ref 30.0–36.0)
MCV: 89.9 fL (ref 80.0–100.0)
Monocytes Absolute: 0.6 10*3/uL (ref 0.1–1.0)
Monocytes Relative: 9 %
Neutro Abs: 4.3 10*3/uL (ref 1.7–7.7)
Neutrophils Relative %: 65 %
Platelets: 247 10*3/uL (ref 150–400)
RBC: 3.56 MIL/uL — ABNORMAL LOW (ref 3.87–5.11)
RDW: 14.6 % (ref 11.5–15.5)
WBC: 6.7 10*3/uL (ref 4.0–10.5)
nRBC: 0 % (ref 0.0–0.2)

## 2022-03-21 LAB — GLUCOSE, CAPILLARY
Glucose-Capillary: 160 mg/dL — ABNORMAL HIGH (ref 70–99)
Glucose-Capillary: 159 mg/dL — ABNORMAL HIGH (ref 70–99)
Glucose-Capillary: 115 mg/dL — ABNORMAL HIGH (ref 70–99)
Glucose-Capillary: 145 mg/dL — ABNORMAL HIGH (ref 70–99)

## 2022-03-21 LAB — BASIC METABOLIC PANEL
Anion gap: 8 (ref 5–15)
BUN: 7 mg/dL — ABNORMAL LOW (ref 8–23)
CO2: 27 mmol/L (ref 22–32)
Calcium: 8.9 mg/dL (ref 8.9–10.3)
Chloride: 110 mmol/L (ref 98–111)
Creatinine, Ser: 0.7 mg/dL (ref 0.44–1.00)
GFR, Estimated: >60 mL/min (ref >60)
Glucose, Bld: 163 mg/dL — ABNORMAL HIGH (ref 70–99)
Potassium: 3 mmol/L — ABNORMAL LOW (ref 3.5–5.1)
Sodium: 145 mmol/L (ref 135–145)

## 2022-03-21 MED ORDER — POTASSIUM CHLORIDE 10 MEQ/100ML IV SOLN
10.0000 meq | INTRAVENOUS | Status: AC
  Administered 2022-03-21 (×3): 10 meq via INTRAVENOUS
  Filled 2022-03-21 (×3): qty 100

## 2022-03-21 MED ORDER — INSULIN ASPART 100 UNIT/ML IJ SOLN: 0.0000 [IU] | Freq: Every day | INTRAMUSCULAR | Status: DC

## 2022-03-21 MED ORDER — INSULIN ASPART 100 UNIT/ML IJ SOLN
0.0000 [IU] | Freq: Three times a day (TID) | INTRAMUSCULAR | Status: DC
  Administered 2022-03-22: 2 [IU] via SUBCUTANEOUS
  Administered 2022-03-23: 1 [IU] via SUBCUTANEOUS
  Administered 2022-03-24: 2 [IU] via SUBCUTANEOUS
  Administered 2022-03-24 – 2022-03-25 (×2): 1 [IU] via SUBCUTANEOUS
  Administered 2022-03-25 (×2): 2 [IU] via SUBCUTANEOUS
  Administered 2022-03-26 – 2022-03-27 (×3): 1 [IU] via SUBCUTANEOUS

## 2022-03-21 MED ORDER — BISACODYL 10 MG RE SUPP
10.0000 mg | Freq: Once | RECTAL | Status: AC
  Administered 2022-03-23: 10 mg via RECTAL
  Filled 2022-03-21 (×2): qty 1

## 2022-03-21 MED ORDER — POTASSIUM CHLORIDE 20 MEQ PO PACK
40.0000 meq | PACK | Freq: Once | ORAL | Status: AC
  Administered 2022-03-21: 40 meq via ORAL
  Filled 2022-03-21: qty 2

## 2022-03-21 MED ORDER — POLYETHYLENE GLYCOL 3350 17 G PO PACK
17.0000 g | PACK | Freq: Every day | ORAL | Status: DC
  Administered 2022-03-21 – 2022-03-24 (×4): 17 g via ORAL
  Filled 2022-03-21 (×5): qty 1

## 2022-03-21 MED ORDER — SODIUM CHLORIDE 0.45 % IV SOLN: INTRAVENOUS | Status: DC

## 2022-03-21 NOTE — Care Management Important Message (Signed)
Important Message  Patient Details  Name: Katherine [name] MRN: [id number] Date of Birth: 05-19-1930   Medicare Important Message Given:  Yes     Hannah Beat 03/21/2022, 2:38 PM

## 2022-03-21 NOTE — Progress Notes (Signed)
Clear liquid tray ordered 

## 2022-03-21 NOTE — Progress Notes (Signed)
Progress Note     Subjective: A&Ox4. Ongoing intermittent abdominal pain, denies bowel function. NG remains low output  Objective: Vital signs in last 24 hours: Temp:  [98.3 F (36.8 C)-99.5 F (37.5 C)] 99.1 F (37.3 C) (01/19 0736) Pulse Rate:  [73-81] 75 (01/19 0736) Resp:  [16-18] 16 (01/19 0736) BP: (136-157)/(62-82) 136/63 (01/19 0736) SpO2:  [99 %-100 %] 99 % (01/19 0736) Weight:  [71 kg-73.2 kg] 73.2 kg (01/19 0547) Last BM Date : 03/15/22  Intake/Output from previous day: 01/18 0701 - 01/19 0700 In: 642 [I.V.:642] Out: 1000 [Urine:1000] Intake/Output this shift: No intake/output data recorded.  PE: General: pleasant, WD, overweight female who is laying in bed and appears comfotable Heart: RRR  Lungs: Respiratory effort nonlabored Abd: soft, NGT 200 mL bilious drainage in cannister.mild tenderness of central abdomen without guarding, there is fullness of the upper abdomen that is stable, previous laparotomy and R subcostal incisions noted.   Lab Results:  Recent Labs    03/19/22 0515 03/21/22 0358  WBC 8.2 6.7  HGB 10.6* 10.2*  HCT 33.7* 32.0*  PLT 225 247   BMET Recent Labs    03/20/22 0904 03/21/22 0358  NA 145 145  K 3.3* 3.0*  CL 108 110  CO2 27 27  GLUCOSE 155* 163*  BUN 10 7*  CREATININE 0.73 0.70  CALCIUM 9.1 8.9   PT/INR No results for input(s): "LABPROT", "INR" in the last 72 hours. CMP     Component Value Date/Time   NA 145 03/21/2022 0358   K 3.0 (L) 03/21/2022 0358   CL 110 03/21/2022 0358   CO2 27 03/21/2022 0358   GLUCOSE 163 (H) 03/21/2022 0358   BUN 7 (L) 03/21/2022 0358   CREATININE 0.70 03/21/2022 0358   CALCIUM 8.9 03/21/2022 0358   PROT 5.8 (L) 03/17/2022 1218   ALBUMIN 3.2 (L) 03/17/2022 1218   AST 26 03/17/2022 1218   ALT 13 03/17/2022 1218   ALKPHOS 66 03/17/2022 1218   BILITOT 0.7 03/17/2022 1218   GFRNONAA >60 03/21/2022 0358   GFRAA 35 (L) 05/08/2019 2043   Lipase     Component Value Date/Time    LIPASE 32 03/17/2022 1218       Studies/Results: DG Abd Portable 1V  Result Date: 03/21/2022 CLINICAL DATA:  Small-bowel obstruction. EXAM: PORTABLE ABDOMEN - 1 VIEW COMPARISON:  KUB 1 day prior. FINDINGS: The enteric catheter side hole is at the level of the GE junction. Consider advancement by approximately 5 cm for more optimal positioning. Enteric contrast is seen in the colon to the level of the descending colon. No contrast is definitely seen in the rectum. Mild gaseous distention of the small bowel in the midabdomen is unchanged, with no new or worsening small bowel dilation. There is no definite free intraperitoneal air. There is no acute osseous abnormality. IMPRESSION: 1. Enteric contrast in the colon to the level of the descending colon. Mild gaseous distention of small bowel in the midabdomen is unchanged with no new or worsening small bowel dilation. 2. Enteric catheter side hole is at the GE junction. Consider advancement by approximately 5 cm for more optimal positioning. These results will be called to the ordering clinician or representative by the Radiologist Assistant, and communication documented in the PACS or [employer]. Electronically Signed   By: Valetta Mole M.D.   On: 03/21/2022 08:28   DG Abd Portable 1V  Result Date: 03/20/2022 EXAM: PORTABLE ABDOMEN - 1 VIEW COMPARISON:  Radiograph 03/19/2022 FINDINGS: NG  tube in stomach. Oral contrast primarily in the RIGHT colon. No dilated loops of small bowel. Gas and stool in the rectum. Dense calcifications in the posterior subcutaneous tissues. IMPRESSION: Nonobstructive bowel gas pattern. Electronically Signed   By: Suzy Bouchard M.D.   On: 03/20/2022 09:34    Anti-infectives: Anti-infectives (From admission, onward)    Start     Dose/Rate Route Frequency Ordered Stop   03/17/22 1345  cefTRIAXone (ROCEPHIN) 1 g in sodium chloride 0.9 % 100 mL IVPB        1 g 200 mL/hr over 30 Minutes Intravenous  Once 03/17/22 1331  03/17/22 1415        Assessment/Plan SBO - CT with sbo with transition in mid to distal jejunum in lower abdomen - NGT to LIWS - SBO protocol with contrast in the colon and improvement of small bowel dilation on KUB. She is not having bowel function yet. Her abdomen is minimally distended and NG low output. On her KUB this morning her NGT side port is in the esophagus -- recommend removal of NG tube and trial of clear liquid diet. If she does well then we can advance her diet tomorrow AM.  If she has a high grade partial obstruction, or a recurrent SBO, then I expect she will develop recurrent nausea, increase in abdominal discomfort, and possible emesis. This will necessitate replacement of NGT and planning for an operation.   No emergent surgical needs today.   FEN: CLD, IVF per primary VTE: SCDs, ok to have SQH or LMWH from surgery standpoint, please hold plavix  ID: rocephin 1/15>> for UTI  - below per TRH -  HTN HFpEF CAD  CVA T2DM Paroxysmal A. Fib Antiplatelet therapy on plavix - hold for now in setting of SBO   LOS: 3 days   I reviewed ED provider notes, hospitalist notes, last 24 h vitals and pain scores, last 48 h intake and output, last 24 h labs and trends, and last 24 h imaging results.     Jill Alexanders, [facility] Surgery 03/21/2022, 10:01 AM Please see Amion for pager number during day hours 7:00am-4:30pm

## 2022-03-21 NOTE — Progress Notes (Signed)
PROGRESS NOTE  [name]  DOB: 1930-07-15  PCP: [name], MD TZG:[id number]  DOA: 03/17/2022  LOS: 3 days  Hospital Day: 5  Brief narrative: Katherine [name] is a 89 y.o. female with PMH significant for DM2, HTN, HLD, paroxysmal A-fib not on anticoagulation, CVA on Plavix, CAD, chronic diastolic CHF, mild to moderate aortic stenosis, CKD, history of cholecystectomy, hysterectomy and subsequent history of SBO. 1/11, patient presented to the ED with complaint of nausea, vomiting, crampy abdominal pain for less than 24 hours. In the ED, patient was afebrile, heart rate in 80s, blood pressure 112/61, breathing on room air Labs with WC count 7.9, hemoglobin 11.8, serum bicarb 18, BUN/creatinine 20/0.85 Lipase and LFTs normal CT abdomen pelvis with small bowel obstruction with transition in the mid to distal jejunum within the lower abdomen. Admitted to [facility] surgery consulted  Subjective: Patient was seen and examined this morning.  Propped up in bed.  Sister at bedside. General surgery follow-up appreciated.  She was tentatively planned for surgical exploration today but apparently seems to be improving with conservative management.  Assessment and plan: SBO (small bowel obstruction)  History of cholecystectomy, hysterectomy Presented with nausea, vomiting, abdominal pain CT abdomen as above showed SBO.  General surgery was consulted and patient was started on conservative management. Although she is not having bowel movement, x-ray abdomen done this morning showed SBO protocol contrast in the colon and improvement of his small bowel dilation on KUB. General surgery removed NG tube today and has started on clear liquid diet.  Type 2 diabetes mellitus Hyperglycemia A1c 7.4 on 03/17/2022 PTA on insulin 70/30 twice daily (45 units a.m., 20 units p.m.) She has been off scheduled insulin and only maintained on sliding scale insulin with Accu-Cheks.  Because of concomitant  hypoglycemia she was also started on dextrose drip. Blood sugar level up this morning to 160.  Since he has been started on clear liquid diet, will switch her from D5 NS drip to half NS only. Recent Labs  Lab 03/20/22 1212 03/20/22 1544 03/20/22 1656 03/20/22 2208 03/21/22 0807  GLUCAP 122* 60* 64* 107* 160*   Chronic diastolic CHF  essential hypertension Remains euvolemic on exam. PTA on amlodipine 5 mg daily, valsartan 320 mg daily Currently oral meds are on hold.  Hydralazine IV as needed.  Mild to moderate aortic valve stenosis Per echocardiogram from 2021. Stable. Continue outpatient follow-up with cardiology   Paroxysmal atrial fibrillation  Not on any AV nodal blocking agent as an outpatient.  Not on anticoagulation  CAD CVA, HLD PTA on Plavix, Lipitor Keep oral meds on hold.  CKD 3b Creatinine at baseline. Recent Labs    03/17/22 1218 03/18/22 0259 03/19/22 0515 03/20/22 0904 03/21/22 0358  BUN '20 17 11 10 '$ 7*  CREATININE 0.85 0.73 0.73 0.73 0.70    Mobility: Uses walker at home.  PT eval   Goals of care   Code Status: Full Code    DVT prophylaxis:  SCDs Start: 03/17/22 1943   Antimicrobials: None currently Fluid: Switch to half NS at 75 mill per hour 3 Consultants: General surgery Family Communication: None at bedside  Scheduled Meds:  insulin aspart  0-15 Units Subcutaneous TID WC    PRN meds: hydrALAZINE, morphine injection, ondansetron (ZOFRAN) IV   Infusions:   sodium chloride     potassium chloride 10 mEq (03/21/22 1117)    Antimicrobials: Anti-infectives (From admission, onward)    Start     Dose/Rate Route Frequency Ordered  Stop   03/17/22 1345  cefTRIAXone (ROCEPHIN) 1 g in sodium chloride 0.9 % 100 mL IVPB        1 g 200 mL/hr over 30 Minutes Intravenous  Once 03/17/22 1331 03/17/22 1415       Skin assessment:      Diet:  Diet Order             Diet clear liquid Room service appropriate? Yes; Fluid consistency:  Thin  Diet effective now                   Nutritional status:  Body mass index is 25.28 kg/m.         Status is: Inpatient Level of care: Telemetry Medical  Continue in-hospital care because: Conservative management of bowel obstruction.  Seems to be improving.  May not need surgery Dispo: The patient is from: Home              Anticipated d/c is to: Pending clinical course              Patient currently is not medically stable to d/c.   Difficult to place patient No   Objective: Vitals:   03/21/22 0542 03/21/22 0736  BP: (!) 150/70 136/63  Pulse: 81 75  Resp: 18 16  Temp: 98.3 F (36.8 C) 99.1 F (37.3 C)  SpO2: 99% 99%    Intake/Output Summary (Last 24 hours) at 03/21/2022 1130 Last data filed at 03/20/2022 2145 Gross per 24 hour  Intake 642.01 ml  Output 1000 ml  Net -357.99 ml   Filed Weights   03/20/22 1800 03/21/22 0547  Weight: 71 kg 73.2 kg   Weight change:  Body mass index is 25.28 kg/m.   Physical Exam: General exam: Pleasant, elderly African-American female. Skin: No rashes, lesions or ulcers. HEENT: Atraumatic, normocephalic, no obvious bleeding.  NG tube to suction Lungs: Clear to auscultation bilaterally CVS: Regular rate and rhythm, no murmur GI/Abd soft, distention improved, bowel sound present CNS: Alert, awake, oriented x 3 Psychiatry: Sad affect Extremities: No pedal edema, no calf tenderness  Data Review: I have personally reviewed the laboratory data and studies available.  F/u labs ordered Unresulted Labs (From admission, onward)     Start     Ordered   03/21/22 2202  Basic metabolic panel  Daily,   R     Question:  Specimen collection method  Answer:  Lab=Lab collect   03/20/22 0805            Total time spent in review of labs and imaging, patient evaluation, formulation of plan, documentation and communication with family: 83 minutes  Signed, Terrilee Croak, MD [facility] 03/21/2022

## 2022-03-21 NOTE — Progress Notes (Signed)
Physical Therapy Treatment Patient Details Name: Katherine [name] MRN: [id number] DOB: March 05, 1930 Today's Date: 03/21/2022   History of Present Illness Katherine [name] is a 89 y.o. female who presents with abdominal pain. CT abdomen pelvis with small bowel obstruction with transition in the mid to distal jejunum within the lower abdomen. NGT placed to suction. General surgery consulted, plan for ex-lap 1/19. PMH: HFpEF, CAD, CVA, HTN, T2DM, paroxysmal atrial fibrillation not on oral anticoagulation, hx of hysterectomy, cholecystectomy, hx of SBO.    PT Comments    Patient with very limited progress.  Still very slow and now needing increased help for sit to stand.  She is at high risk for falls and would not be safe to return home alone regardless of if she has to have surgery.  Feel she may need STSNF level rehab if she will agree unless family can provide increased support when her aide is not there.  PT will continue to follow.    Recommendations for follow up therapy are one component of a multi-disciplinary discharge planning process, led by the attending physician.  Recommendations may be updated based on patient status, additional functional criteria and insurance authorization.  Follow Up Recommendations  Skilled nursing-short term rehab (<3 hours/day) Can patient physically be transported by private vehicle: Yes   Assistance Recommended at Discharge Frequent or constant Supervision/Assistance  Patient can return home with the following Assistance with cooking/housework;Assist for transportation;A little help with bathing/dressing/bathroom;Help with stairs or ramp for entrance;A lot of help with walking and/or transfers   Equipment Recommendations  None recommended by PT    Recommendations for Other Services       Precautions / Restrictions Precautions Precautions: Fall     Mobility  Bed Mobility Overal bed mobility: Needs Assistance Bed Mobility: Supine to Sit     Supine to  sit: HOB elevated, Min guard   Sit to sidelying: Mod assist, HOB elevated General bed mobility comments: increased time, but able to eventually work herself out to EOB minguard for scooting hips; to supine assist to lift legs    Transfers Overall transfer level: Needs assistance Equipment used: Rolling walker (2 wheels) Transfers: Sit to/from Stand Sit to Stand: Mod assist           General transfer comment: up from EOB then from recliner with L UE support but still lifting help needed to stand fully    Ambulation/Gait Ambulation/Gait assistance: Min assist Gait Distance (Feet): 15 Feet Assistive device: Rolling walker (2 wheels) Gait Pattern/deviations: Decreased stride length, Step-to pattern, Trunk flexed, Decreased dorsiflexion - right, Decreased dorsiflexion - left, Shuffle       General Gait Details: very slow and flexed and limited by fatigues   Stairs             Wheelchair Mobility    Modified Rankin (Stroke Patients Only)       Balance Overall balance assessment: Needs assistance Sitting-balance support: Feet supported Sitting balance-Leahy Scale: Fair     Standing balance support: Reliant on assistive device for balance Standing balance-Leahy Scale: Poor Standing balance comment: UE support needed for balance                            Cognition Arousal/Alertness: Awake/alert Behavior During Therapy: Flat affect Overall Cognitive Status: History of cognitive impairments - at baseline  General Comments: slow processing and not accepting she will not be able to manage at home on her own        Exercises      General Comments General comments (skin integrity, edema, etc.): Neice arrived during session and supportive, sister here earlier when pt eating      Pertinent Vitals/Pain Pain Assessment Faces Pain Scale: Hurts little more Pain Location: abdomen Pain Descriptors /  Indicators: Discomfort, Grimacing, Guarding Pain Intervention(s): Monitored during session, Repositioned    Home Living                          Prior Function            PT Goals (current goals can now be found in the care plan section) Progress towards PT goals: Not progressing toward goals - comment    Frequency    Min 3X/week      PT Plan Discharge plan needs to be updated    Co-evaluation              AM-PAC PT "6 Clicks" Mobility   Outcome Measure  Help needed turning from your back to your side while in a flat bed without using bedrails?: A Little Help needed moving from lying on your back to sitting on the side of a flat bed without using bedrails?: A Little Help needed moving to and from a bed to a chair (including a wheelchair)?: A Little Help needed standing up from a chair using your arms (e.g., wheelchair or bedside chair)?: A Lot Help needed to walk in hospital room?: Total Help needed climbing 3-5 steps with a railing? : Total 6 Click Score: 13    End of Session Equipment Utilized During Treatment: Gait belt Activity Tolerance: Patient limited by fatigue Patient left: in bed;with call bell/phone within reach;with bed alarm set;with family/visitor present   PT Visit Diagnosis: Other abnormalities of gait and mobility (R26.89);Muscle weakness (generalized) (M62.81)     Time: 9702-6378 PT Time Calculation (min) (ACUTE ONLY): 30 min  Charges:  $Gait Training: 8-22 mins $Therapeutic Activity: 8-22 mins                     Magda Kiel, PT Acute Rehabilitation Services Office:[phone] 03/21/2022    Reginia Naas 03/21/2022, 2:07 PM

## 2022-03-21 NOTE — Inpatient Diabetes Management (Signed)
Inpatient Diabetes Program Recommendations  AACE/ADA: New Consensus Statement on Inpatient Glycemic Control (2015)  Target Ranges:  Prepandial:   less than 140 mg/dL      Peak postprandial:   less than 180 mg/dL (1-2 hours)      Critically ill patients:  140 - 180 mg/dL   Lab Results  Component Value Date   GLUCAP 159 (H) 03/21/2022   HGBA1C 7.4 (H) 03/17/2022    Latest Reference Range & Units 03/20/22 08:20 03/20/22 12:12 03/20/22 15:44 03/20/22 16:56 03/20/22 22:08 03/21/22 08:07 03/21/22 11:46  Glucose-Capillary 70 - 99 mg/dL 127 (H) Novolog 2 units 122 (H) Novolog 2 units 60 (L) 64 (L) 107 (H) 160 (H) Novolog 3 units 159 (H)  (H): Data is abnormally high (L): Data is abnormally low  Diabetes history: DM2 Outpatient Diabetes medications: 70/30 45 units am, 20 units hs Current orders for Inpatient glycemic control: Novolog 0-15 units tid  Inpatient Diabetes Program Recommendations:   Patient had hypoglycemia post Novolog correction -Decrease Novolog to 0-9 units tid  Thank you, Nani Gasser. Radames Mejorado, RN, MSN, CDE  Diabetes Coordinator Inpatient Glycemic Control Team Team Pager [phone] (8am-5pm) 03/21/2022 12:12 PM

## 2022-03-21 NOTE — Progress Notes (Signed)
Telemetry reports of single 6 second svt 150HR then back to normal heart rate for this patient.  Patient in no distress, when assessed by this nurse. Patient alert, oriented sitting up and in bed.  She does have complaint of generalized pain including abdomen.  Medication for as needed for pain.  MD made aware.

## 2022-03-22 ENCOUNTER — Inpatient Hospital Stay (HOSPITAL_COMMUNITY): Payer: Medicare Other

## 2022-03-22 DIAGNOSIS — K56609 Unspecified intestinal obstruction, unspecified as to partial versus complete obstruction: Secondary | ICD-10-CM | POA: Diagnosis not present

## 2022-03-22 LAB — GLUCOSE, CAPILLARY
Glucose-Capillary: 167 mg/dL — ABNORMAL HIGH (ref 70–99)
Glucose-Capillary: 105 mg/dL — ABNORMAL HIGH (ref 70–99)
Glucose-Capillary: 111 mg/dL — ABNORMAL HIGH (ref 70–99)
Glucose-Capillary: 112 mg/dL — ABNORMAL HIGH (ref 70–99)
Glucose-Capillary: 90 mg/dL (ref 70–99)

## 2022-03-22 LAB — BASIC METABOLIC PANEL
Anion gap: 6 (ref 5–15)
BUN: 8 mg/dL (ref 8–23)
CO2: 25 mmol/L (ref 22–32)
Calcium: 8.2 mg/dL — ABNORMAL LOW (ref 8.9–10.3)
Chloride: 108 mmol/L (ref 98–111)
Creatinine, Ser: 0.79 mg/dL (ref 0.44–1.00)
GFR, Estimated: >60 mL/min (ref >60)
Glucose, Bld: 127 mg/dL — ABNORMAL HIGH (ref 70–99)
Potassium: 3.4 mmol/L — ABNORMAL LOW (ref 3.5–5.1)
Sodium: 139 mmol/L (ref 135–145)

## 2022-03-22 LAB — MAGNESIUM: Magnesium: 1.7 mg/dL (ref 1.7–2.4)

## 2022-03-22 MED ORDER — ENOXAPARIN SODIUM 40 MG/0.4ML IJ SOSY
40.0000 mg | PREFILLED_SYRINGE | INTRAMUSCULAR | Status: DC
  Administered 2022-03-22 – 2022-03-27 (×6): 40 mg via SUBCUTANEOUS
  Filled 2022-03-22 (×6): qty 0.4

## 2022-03-22 MED ORDER — POTASSIUM CHLORIDE 20 MEQ PO PACK
40.0000 meq | PACK | Freq: Once | ORAL | Status: AC
  Administered 2022-03-22: 40 meq via ORAL
  Filled 2022-03-22: qty 2

## 2022-03-22 NOTE — Plan of Care (Signed)

## 2022-03-22 NOTE — Plan of Care (Signed)
  Problem: Education: Goal: Ability to describe self-care measures that may prevent or decrease complications (Diabetes Survival Skills Education) will improve Outcome: Progressing   Problem: Coping: Goal: Ability to adjust to condition or change in health will improve Outcome: Progressing   Problem: Fluid Volume: Goal: Ability to maintain a balanced intake and output will improve Outcome: Progressing

## 2022-03-22 NOTE — Progress Notes (Signed)
Assessment & Plan: HD#5 - SBO  NG removed yesterday, trial clear liquids  AXR with contrast to colon  Encouraged OOB, ambulation  TRH to replete potassium   Hopefully will resolve SBO with non-operative management.  High risk for operative intervention given complex prior surgery, age, comorbidities.  Will follow.   FEN: CLD, IVF per primary VTE: SCDs, ok to have SQH or LMWH from surgery standpoint, please hold plavix  ID: rocephin 1/15>> for UTI   - below per TRH -  HTN HFpEF CAD  CVA T2DM Paroxysmal A. Fib Antiplatelet therapy on plavix - hold for now in setting of SBO        [name], MD [facility] [place] practice Office: [phone]        Chief Complaint: SBO  Subjective: Patient in bed, on phone with sister, no complaints.  No flatus or BM per patient.  Objective: Vital signs in last 24 hours: Temp:  [98.6 F (37 C)-98.8 F (37.1 C)] 98.6 F (37 C) (01/20 0505) Pulse Rate:  [73-79] 79 (01/20 0505) Resp:  [17-18] 17 (01/20 0505) BP: (126-146)/(57-67) 126/67 (01/20 0505) SpO2:  [100 %] 100 % (01/20 0505) Weight:  [73.4 kg] 73.4 kg (01/20 0500) Last BM Date : 03/15/22  Intake/Output from previous day: 01/19 0701 - 01/20 0700 In: -  Out: 700 [Urine:700] Intake/Output this shift: No intake/output data recorded.  Physical Exam: HEENT - sclerae clear, mucous membranes moist Abdomen - soft, minimal distension, mild tenderness diffusely Neuro - alert & oriented, no focal deficits  Lab Results:  Recent Labs    03/21/22 0358  WBC 6.7  HGB 10.2*  HCT 32.0*  PLT 247   BMET Recent Labs    03/21/22 0358 03/22/22 0322  NA 145 139  K 3.0* 3.4*  CL 110 108  CO2 27 25  GLUCOSE 163* 127*  BUN 7* 8  CREATININE 0.70 0.79  CALCIUM 8.9 8.2*   PT/INR No results for input(s): "LABPROT", "INR" in the last 72 hours. Comprehensive Metabolic Panel:    Component Value Date/Time   NA 139 03/22/2022 0322   NA 145 03/21/2022  0358   K 3.4 (L) 03/22/2022 0322   K 3.0 (L) 03/21/2022 0358   CL 108 03/22/2022 0322   CL 110 03/21/2022 0358   CO2 25 03/22/2022 0322   CO2 27 03/21/2022 0358   BUN 8 03/22/2022 0322   BUN 7 (L) 03/21/2022 0358   CREATININE 0.79 03/22/2022 0322   CREATININE 0.70 03/21/2022 0358   GLUCOSE 127 (H) 03/22/2022 0322   GLUCOSE 163 (H) 03/21/2022 0358   CALCIUM 8.2 (L) 03/22/2022 0322   CALCIUM 8.9 03/21/2022 0358   AST 26 03/17/2022 1218   AST 19 05/08/2019 2043   ALT 13 03/17/2022 1218   ALT 13 05/08/2019 2043   ALKPHOS 66 03/17/2022 1218   ALKPHOS 67 05/08/2019 2043   BILITOT 0.7 03/17/2022 1218   BILITOT 0.5 05/08/2019 2043   PROT 5.8 (L) 03/17/2022 1218   PROT 6.7 05/08/2019 2043   ALBUMIN 3.2 (L) 03/17/2022 1218   ALBUMIN 3.1 (L) 02/05/2020 0459    Studies/Results: DG Abd Portable 1V  Result Date: 03/21/2022 CLINICAL DATA:  Small-bowel obstruction. EXAM: PORTABLE ABDOMEN - 1 VIEW COMPARISON:  KUB 1 day prior. FINDINGS: The enteric catheter side hole is at the level of the GE junction. Consider advancement by approximately 5 cm for more optimal positioning. Enteric contrast is seen in the colon to the level of the  descending colon. No contrast is definitely seen in the rectum. Mild gaseous distention of the small bowel in the midabdomen is unchanged, with no new or worsening small bowel dilation. There is no definite free intraperitoneal air. There is no acute osseous abnormality. IMPRESSION: 1. Enteric contrast in the colon to the level of the descending colon. Mild gaseous distention of small bowel in the midabdomen is unchanged with no new or worsening small bowel dilation. 2. Enteric catheter side hole is at the GE junction. Consider advancement by approximately 5 cm for more optimal positioning. These results will be called to the ordering clinician or representative by the Radiologist Assistant, and communication documented in the PACS or [employer]. Electronically Signed    By: Valetta Mole M.D.   On: 03/21/2022 08:28   DG Abd Portable 1V  Result Date: 03/20/2022 EXAM: PORTABLE ABDOMEN - 1 VIEW COMPARISON:  Radiograph 03/19/2022 FINDINGS: NG tube in stomach. Oral contrast primarily in the RIGHT colon. No dilated loops of small bowel. Gas and stool in the rectum. Dense calcifications in the posterior subcutaneous tissues. IMPRESSION: Nonobstructive bowel gas pattern. Electronically Signed   By: Suzy Bouchard M.D.   On: 03/20/2022 09:34      [name] 03/22/2022  Patient ID: Katherine [name], female   DOB: 12-01-1930, 89 y.o.   MRN: [id number]

## 2022-03-22 NOTE — Progress Notes (Signed)
PROGRESS NOTE    Katherine [name]  KGU:[id number] DOB: 01/27/1931 DOA: 03/17/2022 PCP: [name], MD   Brief Narrative:  Katherine [name] is a 89 y.o. female with PMH significant for DM2, HTN, HLD, paroxysmal A-fib not on anticoagulation, CVA on Plavix, CAD, chronic diastolic CHF, mild to moderate aortic stenosis, CKD, history of cholecystectomy, hysterectomy and subsequent history of SBO. 1/11, patient presented to the ED with complaint of nausea, vomiting, crampy abdominal pain for less than 24 hours. In the ED, patient was afebrile, heart rate in 80s, blood pressure 112/61, breathing on room air Labs with WC count 7.9, hemoglobin 11.8, serum bicarb 18, BUN/creatinine 20/0.85 Lipase and LFTs normal CT abdomen pelvis with small bowel obstruction with transition in the mid to distal jejunum within the lower abdomen. Admitted to [place] surgery consulted  Assessment & Plan:   SBO (small bowel obstruction)  History of cholecystectomy, hysterectomy -CT abdomen as above showed SBO.  General surgery was consulted and patient was started on conservative management. -Although she is not having bowel movement, x-ray abdomen  showed SBO protocol contrast in the colon and improvement of his small bowel dilation on KUB. -General surgery removed NG tube on 1/19 and has started on clear liquid diet. -Abdominal x-ray 1/20 shows nonspecific bowel gas pattern.  No significant obstruction.  Type 2 diabetes mellitus Hyperglycemia -A1c 7.4 on 03/17/2022 -on insulin 70/30 twice daily (45 units a.m., 20 units p.m.) -Had an episode of hypoglycemia.  Blood sugar now runs in 160s.  Will continue sliding scale insulin and half-normal saline fluids for now.  Continue to monitor blood sugar closely  Hypokalemia: Replenished.  Check magnesium level.  Repeat BMP tomorrow a.m.  Chronic diastolic CHF  essential hypertension -Remains euvolemic on exam. -on amlodipine 5 mg daily, valsartan 320 mg  daily -Blood pressure remained stable.  Currently oral meds are on hold.  Hydralazine IV as needed.   Mild to moderate aortic valve stenosis -Per echocardiogram from 2021. -Continue outpatient follow-up with cardiology   Paroxysmal atrial fibrillation  -Not on any AV nodal blocking agent as an outpatient.  Not on anticoagulation   CAD CVA, HLD -on Plavix, Lipitor -Keep oral meds on hold.   CKD 3b -Creatinine at baseline.  DVT prophylaxis: Lovenox Code Status: Full code Family Communication:  None present at bedside.  Plan of care discussed with patient in length and she verbalized understanding and agreed with it. Disposition Plan: To be determined  Consultants:  General surgery  Procedures:  None  Antimicrobials:  None  Status is: Inpatient    Subjective: Patient seen and examined.  Lying comfortably on the bed.  No bowel movement, not passing gas.  Has some nausea.  Able to tolerate clear diet.  No acute events overnight. Objective: Vitals:   03/21/22 1722 03/21/22 2119 03/22/22 0500 03/22/22 0505  BP: (!) 146/64 (!) 135/57  126/67  Pulse: 76 73  79  Resp: '17 18  17  '$ Temp: 98.7 F (37.1 C) 98.8 F (37.1 C)  98.6 F (37 C)  TempSrc: Oral Oral  Oral  SpO2: 100% 100%  100%  Weight:   73.4 kg     Intake/Output Summary (Last 24 hours) at 03/22/2022 1030 Last data filed at 03/21/2022 2123 Gross per 24 hour  Intake --  Output 700 ml  Net -700 ml   Filed Weights   03/20/22 1800 03/21/22 0547 03/22/22 0500  Weight: 71 kg 73.2 kg 73.4 kg    Examination:  General exam:  Appears calm and comfortable, on room air, communicating well, eating breakfast Respiratory system: Clear to auscultation. Respiratory effort normal. Cardiovascular system: S1 & S2 heard, RRR. No JVD, murmurs, rubs, gallops or clicks. No pedal edema. Gastrointestinal system: Abdomen is soft, distended, generalized mild abdominal tenderness positive, no guarding, no rigidity Central nervous  system: Alert and oriented. No focal neurological deficits. Extremities: Symmetric 5 x 5 power. Skin: No rashes, lesions or ulcers Psychiatry: Judgement and insight appear normal. Mood & affect appropriate.    Data Reviewed: I have personally reviewed following labs and imaging studies  CBC: Recent Labs  Lab 03/17/22 0915 03/19/22 0515 03/21/22 0358  WBC 7.9 8.2 6.7  NEUTROABS 6.9 3.9 4.3  HGB 11.8* 10.6* 10.2*  HCT 36.2 33.7* 32.0*  MCV 89.8 92.3 89.9  PLT 297 225 161   Basic Metabolic Panel: Recent Labs  Lab 03/18/22 0259 03/19/22 0515 03/20/22 0904 03/21/22 0358 03/22/22 0322  NA 144 147* 145 145 139  K 3.6 3.8 3.3* 3.0* 3.4*  CL 109 110 108 110 108  CO2 '26 25 27 27 25  '$ GLUCOSE 159* 138* 155* 163* 127*  BUN '17 11 10 '$ 7* 8  CREATININE 0.73 0.73 0.73 0.70 0.79  CALCIUM 10.2 9.2 9.1 8.9 8.2*  MG  --  1.8  --   --   --   PHOS  --  2.7  --   --   --    GFR: CrCl cannot be calculated (Unknown ideal weight.). Liver Function Tests: Recent Labs  Lab 03/17/22 1218  AST 26  ALT 13  ALKPHOS 66  BILITOT 0.7  PROT 5.8*  ALBUMIN 3.2*   Recent Labs  Lab 03/17/22 1218  LIPASE 32   No results for input(s): "AMMONIA" in the last 168 hours. Coagulation Profile: No results for input(s): "INR", "PROTIME" in the last 168 hours. Cardiac Enzymes: No results for input(s): "CKTOTAL", "CKMB", "CKMBINDEX", "TROPONINI" in the last 168 hours. BNP (last 3 results) No results for input(s): "PROBNP" in the last 8760 hours. HbA1C: No results for input(s): "HGBA1C" in the last 72 hours. CBG: Recent Labs  Lab 03/21/22 0807 03/21/22 1146 03/21/22 1704 03/21/22 2115 03/22/22 0811  GLUCAP 160* 159* 115* 145* 167*   Lipid Profile: No results for input(s): "CHOL", "HDL", "LDLCALC", "TRIG", "CHOLHDL", "LDLDIRECT" in the last 72 hours. Thyroid Function Tests: No results for input(s): "TSH", "T4TOTAL", "FREET4", "T3FREE", "THYROIDAB" in the last 72 hours. Anemia Panel: No  results for input(s): "VITAMINB12", "FOLATE", "FERRITIN", "TIBC", "IRON", "RETICCTPCT" in the last 72 hours. Sepsis Labs: No results for input(s): "PROCALCITON", "LATICACIDVEN" in the last 168 hours.  No results found for this or any previous visit (from the past 240 hour(s)).    Radiology Studies: DG Abd Portable 1V  Result Date: 03/22/2022 CLINICAL DATA:  Small bowel obstruction EXAM: PORTABLE ABDOMEN - 1 VIEW COMPARISON:  03/21/2022 FINDINGS: NG tube removed. Similar scattered air throughout small bowel and colon. Residual contrast noted throughout the colon. Air in the rectum. No significant obstruction pattern by plain radiography. Remote cholecystectomy noted and IVC filter. Degenerative changes throughout the spine. Extensive soft tissue calcifications over the iliac crest bilaterally. IMPRESSION: Nonspecific bowel gas pattern. No significant obstruction pattern by plain radiography. Electronically Signed   By: Jerilynn Mages.  Shick M.D.   On: 03/22/2022 09:28   DG Abd Portable 1V  Result Date: 03/21/2022 CLINICAL DATA:  Small-bowel obstruction. EXAM: PORTABLE ABDOMEN - 1 VIEW COMPARISON:  KUB 1 day prior. FINDINGS: The enteric catheter side hole is at  the level of the GE junction. Consider advancement by approximately 5 cm for more optimal positioning. Enteric contrast is seen in the colon to the level of the descending colon. No contrast is definitely seen in the rectum. Mild gaseous distention of the small bowel in the midabdomen is unchanged, with no new or worsening small bowel dilation. There is no definite free intraperitoneal air. There is no acute osseous abnormality. IMPRESSION: 1. Enteric contrast in the colon to the level of the descending colon. Mild gaseous distention of small bowel in the midabdomen is unchanged with no new or worsening small bowel dilation. 2. Enteric catheter side hole is at the GE junction. Consider advancement by approximately 5 cm for more optimal positioning. These  results will be called to the ordering clinician or representative by the Radiologist Assistant, and communication documented in the PACS or [employer]. Electronically Signed   By: Valetta Mole M.D.   On: 03/21/2022 08:28    Scheduled Meds:  insulin aspart  0-5 Units Subcutaneous QHS   insulin aspart  0-9 Units Subcutaneous TID WC   polyethylene glycol  17 g Oral Daily   Continuous Infusions:  sodium chloride 75 mL/hr at 03/22/22 0204     LOS: 4 days   Time spent: 35 minutes   Treena Cosman Loann Quill, MD [facility]  If 7PM-7AM, please contact night-coverage [email or web address] 03/22/2022, 10:30 AM

## 2022-03-23 DIAGNOSIS — K56609 Unspecified intestinal obstruction, unspecified as to partial versus complete obstruction: Secondary | ICD-10-CM | POA: Diagnosis not present

## 2022-03-23 LAB — BASIC METABOLIC PANEL
Anion gap: 10 (ref 5–15)
BUN: 7 mg/dL — ABNORMAL LOW (ref 8–23)
CO2: 21 mmol/L — ABNORMAL LOW (ref 22–32)
Calcium: 8.4 mg/dL — ABNORMAL LOW (ref 8.9–10.3)
Chloride: 106 mmol/L (ref 98–111)
Creatinine, Ser: 0.76 mg/dL (ref 0.44–1.00)
GFR, Estimated: >60 mL/min (ref >60)
Glucose, Bld: 87 mg/dL (ref 70–99)
Potassium: 4 mmol/L (ref 3.5–5.1)
Sodium: 137 mmol/L (ref 135–145)

## 2022-03-23 LAB — CBC
HCT: 28.3 % — ABNORMAL LOW (ref 36.0–46.0)
Hemoglobin: 8.9 g/dL — ABNORMAL LOW (ref 12.0–15.0)
MCH: 29 pg (ref 26.0–34.0)
MCHC: 31.4 g/dL (ref 30.0–36.0)
MCV: 92.2 fL (ref 80.0–100.0)
Platelets: 219 10*3/uL (ref 150–400)
RBC: 3.07 MIL/uL — ABNORMAL LOW (ref 3.87–5.11)
RDW: 14.4 % (ref 11.5–15.5)
WBC: 6.2 10*3/uL (ref 4.0–10.5)
nRBC: 0 % (ref 0.0–0.2)

## 2022-03-23 LAB — GLUCOSE, CAPILLARY
Glucose-Capillary: 133 mg/dL — ABNORMAL HIGH (ref 70–99)
Glucose-Capillary: 97 mg/dL (ref 70–99)
Glucose-Capillary: 76 mg/dL (ref 70–99)
Glucose-Capillary: 101 mg/dL — ABNORMAL HIGH (ref 70–99)
Glucose-Capillary: 130 mg/dL — ABNORMAL HIGH (ref 70–99)

## 2022-03-23 NOTE — Progress Notes (Signed)
PT is having trouble passing stool from her rectum. Gave suppository hoping to soften the stool, pt is on her left side. This nurse did feel stool upon inserting suppository.

## 2022-03-23 NOTE — Progress Notes (Signed)
Assessment & Plan: HD#6 - SBO             tolerating limited clear liquids, will advance to fulls today             AXR yesterday with nonspecific gas pattern, no obstruction             Encouraged OOB, ambulation   Hopefully will resolve SBO with non-operative management.  High risk for operative intervention given complex prior surgery, age, comorbidities.  Will follow.   FEN: FLD, IVF per primary VTE: SCDs, ok to have SQH or LMWH from surgery standpoint, please hold plavix  ID: rocephin 1/15>> for UTI   - below per TRH -  HTN HFpEF CAD  CVA T2DM Paroxysmal A. Fib Antiplatelet therapy on plavix - hold for now in setting of SBO        Katherine Gemma, MD [facility] [place] practice Office: [phone]        Chief Complaint: SBO  Subjective: Patient in bed, comfortable.  Denies nausea.  Limited clear liquid intake.  Denies flatus or BM.  Objective: Vital signs in last 24 hours: Temp:  [98 F (36.7 C)-98.9 F (37.2 C)] 98.4 F (36.9 C) (01/21 0834) Pulse Rate:  [58-70] 58 (01/21 0834) Resp:  [17-18] 17 (01/21 0834) BP: (128-137)/(57-80) 128/57 (01/21 0834) SpO2:  [98 %-100 %] 100 % (01/21 0834) Weight:  [72.9 kg] 72.9 kg (01/21 0500) Last BM Date : 03/21/22  Intake/Output from previous day: 01/20 0701 - 01/21 0700 In: 2626.6 [I.V.:2626.6] Out: 900 [Urine:900] Intake/Output this shift: No intake/output data recorded.  Physical Exam: HEENT - sclerae clear, mucous membranes moist Abdomen - soft, minimal distension; BS present Neuro - alert & oriented, no focal deficits  Lab Results:  Recent Labs    03/21/22 0358 03/23/22 0315  WBC 6.7 6.2  HGB 10.2* 8.9*  HCT 32.0* 28.3*  PLT 247 219   BMET Recent Labs    03/22/22 0322 03/23/22 0315  NA 139 137  K 3.4* 4.0  CL 108 106  CO2 25 21*  GLUCOSE 127* 87  BUN 8 7*  CREATININE 0.79 0.76  CALCIUM 8.2* 8.4*   PT/INR No results for input(s): "LABPROT", "INR" in the last 72  hours. Comprehensive Metabolic Panel:    Component Value Date/Time   NA 137 03/23/2022 0315   NA 139 03/22/2022 0322   K 4.0 03/23/2022 0315   K 3.4 (L) 03/22/2022 0322   CL 106 03/23/2022 0315   CL 108 03/22/2022 0322   CO2 21 (L) 03/23/2022 0315   CO2 25 03/22/2022 0322   BUN 7 (L) 03/23/2022 0315   BUN 8 03/22/2022 0322   CREATININE 0.76 03/23/2022 0315   CREATININE 0.79 03/22/2022 0322   GLUCOSE 87 03/23/2022 0315   GLUCOSE 127 (H) 03/22/2022 0322   CALCIUM 8.4 (L) 03/23/2022 0315   CALCIUM 8.2 (L) 03/22/2022 0322   AST 26 03/17/2022 1218   AST 19 05/08/2019 2043   ALT 13 03/17/2022 1218   ALT 13 05/08/2019 2043   ALKPHOS 66 03/17/2022 1218   ALKPHOS 67 05/08/2019 2043   BILITOT 0.7 03/17/2022 1218   BILITOT 0.5 05/08/2019 2043   PROT 5.8 (L) 03/17/2022 1218   PROT 6.7 05/08/2019 2043   ALBUMIN 3.2 (L) 03/17/2022 1218   ALBUMIN 3.1 (L) 02/05/2020 0459    Studies/Results: DG Abd Portable 1V  Result Date: 03/22/2022 CLINICAL DATA:  Small bowel obstruction EXAM: PORTABLE ABDOMEN - 1 VIEW  COMPARISON:  03/21/2022 FINDINGS: NG tube removed. Similar scattered air throughout small bowel and colon. Residual contrast noted throughout the colon. Air in the rectum. No significant obstruction pattern by plain radiography. Remote cholecystectomy noted and IVC filter. Degenerative changes throughout the spine. Extensive soft tissue calcifications over the iliac crest bilaterally. IMPRESSION: Nonspecific bowel gas pattern. No significant obstruction pattern by plain radiography. Electronically Signed   By: Jerilynn Mages.  Shick M.D.   On: 03/22/2022 09:28      Katherine [name] 03/23/2022  Patient ID: Katherine [name], female   DOB: 1930/11/16, 89 y.o.   MRN: [id number]

## 2022-03-23 NOTE — Progress Notes (Signed)
PROGRESS NOTE    Katherine [name]  LZJ:[id number] DOB: April 02, 1930 DOA: 03/17/2022 PCP: [name], MD   Brief Narrative:  Katherine [name] is a 89 y.o. female with PMH significant for DM2, HTN, HLD, paroxysmal A-fib not on anticoagulation, CVA on Plavix, CAD, chronic diastolic CHF, mild to moderate aortic stenosis, CKD, history of cholecystectomy, hysterectomy and subsequent history of SBO. 1/11, patient presented to the ED with complaint of nausea, vomiting, crampy abdominal pain for less than 24 hours. In the ED, patient was afebrile, heart rate in 80s, blood pressure 112/61, breathing on room air Labs with WC count 7.9, hemoglobin 11.8, serum bicarb 18, BUN/creatinine 20/0.85 Lipase and LFTs normal CT abdomen pelvis with small bowel obstruction with transition in the mid to distal jejunum within the lower abdomen. Admitted to [place] surgery consulted  Assessment & Plan:   SBO (small bowel obstruction)  History of cholecystectomy, hysterectomy -CT abdomen as above showed SBO.  General surgery was consulted and patient was started on conservative management. -Although she is not having bowel movement, x-ray abdomen  showed SBO protocol contrast in the colon and improvement of his small bowel dilation on KUB. -General surgery removed NG tube on 1/19 and has started on clear liquid diet. -Abdominal x-ray 1/20 shows nonspecific bowel gas pattern.  No significant obstruction. -Tolerating limited clear liquids.  Advance to full liquid diet today by general surgery.  Hopefully will resolve SBO with nonoperative management.  She is at high risk for operative intervention given complex prior surgery, age and comorbidities. -Encourage ambulation.  Type 2 diabetes mellitus Hyperglycemia -A1c 7.4 on 03/17/2022 -on insulin 70/30 twice daily (45 units a.m., 20 units p.m.) -Had an episode of hypoglycemia.  Blood sugar now runs in 110s to 130s.  Will continue sliding scale insulin and  half-normal saline fluids for now.  Continue to monitor blood sugar closely  Hypokalemia: Replenished.  Magnesium: WNL.  Chronic diastolic CHF  essential hypertension -Remains euvolemic on exam. -on amlodipine 5 mg daily, valsartan 320 mg daily -Blood pressure remained stable.  Currently oral meds are on hold.  Hydralazine IV as needed.   Mild to moderate aortic valve stenosis -Per echocardiogram from 2021. -Continue outpatient follow-up with cardiology   Paroxysmal atrial fibrillation  -Not on any AV nodal blocking agent as an outpatient.  Not on anticoagulation   CAD CVA, HLD -on Plavix, Lipitor -Continue to hold Plavix as per surgery   CKD 3b -Creatinine at baseline.  DVT prophylaxis: Lovenox Code Status: Full code Family Communication:  None present at bedside.  Plan of care discussed with patient in length and she verbalized understanding and agreed with it. Disposition Plan: To be determined  Consultants:  General surgery  Procedures:  None  Antimicrobials:  None  Status is: Inpatient    Subjective: Patient seen and examined.  Lying comfortably on the bed.  Still no bowel movement.  Not passing gas.  Has some nausea.  Able to tolerate clear liquid.  Remained afebrile.  No acute events overnight.  Objective: Vitals:   03/22/22 1942 03/23/22 0500 03/23/22 0509 03/23/22 0834  BP: 136/80  (!) 131/57 (!) 128/57  Pulse: 70  (!) 58 (!) 58  Resp: '18  18 17  '$ Temp: 98.9 F (37.2 C)  98.3 F (36.8 C) 98.4 F (36.9 C)  TempSrc: Oral  Oral   SpO2: 100%  98% 100%  Weight:  72.9 kg      Intake/Output Summary (Last 24 hours) at 03/23/2022 1214 Last data  filed at 03/23/2022 1127 Gross per 24 hour  Intake 2866.63 ml  Output 1400 ml  Net 1466.63 ml    Filed Weights   03/21/22 0547 03/22/22 0500 03/23/22 0500  Weight: 73.2 kg 73.4 kg 72.9 kg    Examination:  General exam: Appears calm and comfortable, on room air, communicating well Respiratory system:  Clear to auscultation. Respiratory effort normal. Cardiovascular system: S1 & S2 heard, RRR. No JVD, murmurs, rubs, gallops or clicks. No pedal edema. Gastrointestinal system: Abdomen is soft, generalized mild abdominal tenderness positive, no guarding, no rigidity Central nervous system: Alert and oriented. No focal neurological deficits. Extremities: Symmetric 5 x 5 power. Skin: No rashes, lesions or ulcers Psychiatry: Judgement and insight appear normal. Mood & affect appropriate.    Data Reviewed: I have personally reviewed following labs and imaging studies  CBC: Recent Labs  Lab 03/17/22 0915 03/19/22 0515 03/21/22 0358 03/23/22 0315  WBC 7.9 8.2 6.7 6.2  NEUTROABS 6.9 3.9 4.3  --   HGB 11.8* 10.6* 10.2* 8.9*  HCT 36.2 33.7* 32.0* 28.3*  MCV 89.8 92.3 89.9 92.2  PLT 297 225 247 096    Basic Metabolic Panel: Recent Labs  Lab 03/19/22 0515 03/20/22 0904 03/21/22 0358 03/22/22 0322 03/23/22 0315  NA 147* 145 145 139 137  K 3.8 3.3* 3.0* 3.4* 4.0  CL 110 108 110 108 106  CO2 '25 27 27 25 '$ 21*  GLUCOSE 138* 155* 163* 127* 87  BUN 11 10 7* 8 7*  CREATININE 0.73 0.73 0.70 0.79 0.76  CALCIUM 9.2 9.1 8.9 8.2* 8.4*  MG 1.8  --   --  1.7  --   PHOS 2.7  --   --   --   --     GFR: CrCl cannot be calculated (Unknown ideal weight.). Liver Function Tests: Recent Labs  Lab 03/17/22 1218  AST 26  ALT 13  ALKPHOS 66  BILITOT 0.7  PROT 5.8*  ALBUMIN 3.2*    Recent Labs  Lab 03/17/22 1218  LIPASE 32    No results for input(s): "AMMONIA" in the last 168 hours. Coagulation Profile: No results for input(s): "INR", "PROTIME" in the last 168 hours. Cardiac Enzymes: No results for input(s): "CKTOTAL", "CKMB", "CKMBINDEX", "TROPONINI" in the last 168 hours. BNP (last 3 results) No results for input(s): "PROBNP" in the last 8760 hours. HbA1C: No results for input(s): "HGBA1C" in the last 72 hours. CBG: Recent Labs  Lab 03/22/22 1157 03/22/22 1638 03/22/22 2113  03/23/22 0833 03/23/22 1151  GLUCAP 105* 112* 90 133* 97    Lipid Profile: No results for input(s): "CHOL", "HDL", "LDLCALC", "TRIG", "CHOLHDL", "LDLDIRECT" in the last 72 hours. Thyroid Function Tests: No results for input(s): "TSH", "T4TOTAL", "FREET4", "T3FREE", "THYROIDAB" in the last 72 hours. Anemia Panel: No results for input(s): "VITAMINB12", "FOLATE", "FERRITIN", "TIBC", "IRON", "RETICCTPCT" in the last 72 hours. Sepsis Labs: No results for input(s): "PROCALCITON", "LATICACIDVEN" in the last 168 hours.  No results found for this or any previous visit (from the past 240 hour(s)).    Radiology Studies: DG Abd Portable 1V  Result Date: 03/22/2022 CLINICAL DATA:  Small bowel obstruction EXAM: PORTABLE ABDOMEN - 1 VIEW COMPARISON:  03/21/2022 FINDINGS: NG tube removed. Similar scattered air throughout small bowel and colon. Residual contrast noted throughout the colon. Air in the rectum. No significant obstruction pattern by plain radiography. Remote cholecystectomy noted and IVC filter. Degenerative changes throughout the spine. Extensive soft tissue calcifications over the iliac crest bilaterally. IMPRESSION: Nonspecific bowel  gas pattern. No significant obstruction pattern by plain radiography. Electronically Signed   By: Jerilynn Mages.  Shick M.D.   On: 03/22/2022 09:28    Scheduled Meds:  bisacodyl  10 mg Rectal Once   enoxaparin (LOVENOX) injection  40 mg Subcutaneous Q24H   insulin aspart  0-5 Units Subcutaneous QHS   insulin aspart  0-9 Units Subcutaneous TID WC   polyethylene glycol  17 g Oral Daily   Continuous Infusions:  sodium chloride 75 mL/hr at 03/22/22 2343     LOS: 5 days   Time spent: 35 minutes   Rita Prom Loann Quill, MD [facility]  If 7PM-7AM, please contact night-coverage [email or web address] 03/23/2022, 12:14 PM

## 2022-03-24 DIAGNOSIS — K56609 Unspecified intestinal obstruction, unspecified as to partial versus complete obstruction: Secondary | ICD-10-CM | POA: Diagnosis not present

## 2022-03-24 LAB — MAGNESIUM: Magnesium: 1.8 mg/dL (ref 1.7–2.4)

## 2022-03-24 LAB — BASIC METABOLIC PANEL
Anion gap: 9 (ref 5–15)
BUN: 7 mg/dL — ABNORMAL LOW (ref 8–23)
CO2: 21 mmol/L — ABNORMAL LOW (ref 22–32)
Calcium: 8.7 mg/dL — ABNORMAL LOW (ref 8.9–10.3)
Chloride: 109 mmol/L (ref 98–111)
Creatinine, Ser: 0.71 mg/dL (ref 0.44–1.00)
GFR, Estimated: >60 mL/min (ref >60)
Glucose, Bld: 103 mg/dL — ABNORMAL HIGH (ref 70–99)
Potassium: 4.1 mmol/L (ref 3.5–5.1)
Sodium: 139 mmol/L (ref 135–145)

## 2022-03-24 LAB — GLUCOSE, CAPILLARY
Glucose-Capillary: 81 mg/dL (ref 70–99)
Glucose-Capillary: 144 mg/dL — ABNORMAL HIGH (ref 70–99)
Glucose-Capillary: 175 mg/dL — ABNORMAL HIGH (ref 70–99)
Glucose-Capillary: 183 mg/dL — ABNORMAL HIGH (ref 70–99)

## 2022-03-24 MED ORDER — BISACODYL 10 MG RE SUPP
10.0000 mg | Freq: Once | RECTAL | Status: AC
  Administered 2022-03-24: 10 mg via RECTAL
  Filled 2022-03-24: qty 1

## 2022-03-24 NOTE — Progress Notes (Signed)
Physical Therapy Treatment Patient Details Name: Katherine [name] MRN: [id number] DOB: 30-Mar-1930 Today's Date: 03/24/2022   History of Present Illness Katherine [name] is a 89 y.o. female who presents with abdominal pain. CT abdomen pelvis with small bowel obstruction with transition in the mid to distal jejunum within the lower abdomen. NGT placed to suction. General surgery consulted, hopefully will resolve SBO with non-operative management. PMH: HFpEF, CAD, CVA, HTN, T2DM, paroxysmal atrial fibrillation not on oral anticoagulation, hx of hysterectomy, cholecystectomy, hx of SBO.    PT Comments    Pt received in supine, agreeable to therapy session and with good participation and tolerance for transfer and gait training. Pt with bowel/bladder incontinence observed upon standing from bed, pt seemingly not fully aware, but otherwise A&O x4 with increased time and minor word finding issues, likely close to her baseline given CVA. Pt needing min to modA for transfers and consistent minA for gait short household distances due to poor RW management and decreased safety awareness. Pt would benefit from OT management due to difficulty managing her own hygiene and may benefit from further cognitive assessment such as pillbox test vs MOCA. Pt reports she prefers to go home with [facility] services but currently not safe to go home with limited assist, therefore continue to recommend SNF. I encouraged her to speak with family members and try to arrange 24/7 initial assist to make sure she will be safe if she does DC home and to consider obtaining life alert or similar services due to high fall risk. Pt continues to benefit from PT services to progress toward functional mobility goals.    Recommendations for follow up therapy are one component of a multi-disciplinary discharge planning process, led by the attending physician.  Recommendations may be updated based on patient status, additional functional criteria and insurance  authorization.  Follow Up Recommendations  Skilled nursing-short term rehab (<3 hours/day) Can patient physically be transported by private vehicle: Yes (needs briefs from home for the trip)   Assistance Recommended at Discharge Frequent or constant Supervision/Assistance  Patient can return home with the following Assistance with cooking/housework;Assist for transportation;A little help with bathing/dressing/bathroom;Help with stairs or ramp for entrance;A lot of help with walking and/or transfers;Other (comment) (may benefit from pillbox test to assess medication mgmt skills now that cognition improving)   Equipment Recommendations  None recommended by PT    Recommendations for Other Services       Precautions / Restrictions Precautions Precautions: Fall Precaution Comments: b/b incontinence Restrictions Weight Bearing Restrictions: No     Mobility  Bed Mobility Overal bed mobility: Needs Assistance Bed Mobility: Supine to Sit     Supine to sit: HOB elevated, Min guard     General bed mobility comments: increased time, but able to eventually work herself out to EOB minguard for scooting hips, heavy reliance on R bed rail to raise her trunk.    Transfers Overall transfer level: Needs assistance Equipment used: Rolling walker (2 wheels) Transfers: Sit to/from Stand Sit to Stand: Mod assist, Min assist           General transfer comment: from EOB x 2 reps, initially with b/b incontinence and needed clean-up standing on second trial, then able to sit down in bathroom on toilet and minA to stand from toilet height with wall rail and RW, then minA for stand>sit to recliner. Pt needs cues for safe UE placement on 50% of transfers.    Ambulation/Gait Ambulation/Gait assistance: Min assist Gait Distance (Feet):  50 Feet (29f, seated break, then 534f Assistive device: Rolling walker (2 wheels) Gait Pattern/deviations: Decreased stride length, Step-to pattern, Trunk  flexed, Decreased dorsiflexion - right, Decreased dorsiflexion - left, Shuffle       General Gait Details: very slow and flexed and limited by fatigue, pt initially keeping RW too far advanced, improved posture/RW management with consistent cues needed; pt resistant to cues but follows them well.   Stairs Stairs:  (1" platform for scale with minA and railings for standing scale)         Balance Overall balance assessment: Needs assistance Sitting-balance support: Feet supported Sitting balance-Leahy Scale: Fair     Standing balance support: Reliant on assistive device for balance Standing balance-Leahy Scale: Poor Standing balance comment: UE support needed for balance, pt at times needs external assist due to LOB/poor RW use                            Cognition Arousal/Alertness: Awake/alert Behavior During Therapy: Flat affect Overall Cognitive Status: History of cognitive impairments - at baseline                                 General Comments: slow processing and not accepting she will not be able to manage at home on her own, pt with soiled bed and b/b incontinence upon standing at bedside (while preparing to ambulate to bathroom), when questioned how she would handle this situation at home, pt states "I'll manage". Poor insight into deficits. Pt needed assist to dial number for dietary, but once given phone was able to state her order and now oriented to location/situation/date/time.  Pt states she wears depends at home all the time, when asked who would pick her up from hospital and if they could bring briefs for her upon discharge, pt unsure of who would assist her.        Exercises      General Comments General comments (skin integrity, edema, etc.): b/b incontinence prior to gait trial and small amount of loose stools moving from bed to toilet/bathroom. Pt reports she wears depends at baseline, none in room. No dizziness reported.       Pertinent Vitals/Pain Pain Assessment Pain Assessment: No/denies pain Pain Intervention(s): Monitored during session, Repositioned     PT Goals (current goals can now be found in the care plan section) Acute Rehab PT Goals Patient Stated Goal: To go home, less pain. PT Goal Formulation: With patient Time For Goal Achievement: 04/02/22 Progress towards PT goals: Progressing toward goals    Frequency    Min 3X/week      PT Plan Current plan remains appropriate       AM-PAC PT "6 Clicks" Mobility   Outcome Measure  Help needed turning from your back to your side while in a flat bed without using bedrails?: A Little Help needed moving from lying on your back to sitting on the side of a flat bed without using bedrails?: A Lot (from flat bed/no rail) Help needed moving to and from a bed to a chair (including a wheelchair)?: A Lot (modA to stand) Help needed standing up from a chair using your arms (e.g., wheelchair or bedside chair)?: A Lot Help needed to walk in hospital room?: A Lot Help needed climbing 3-5 steps with a railing? : Total 6 Click Score: 12    End of Session Equipment  Utilized During Treatment: Gait belt Activity Tolerance: Patient tolerated treatment well Patient left: with call bell/phone within reach;in chair;with chair alarm set;Other (comment) (x2 warm blankets per pt request; pt encouraged to order dinner and PTA dialed # for dietary and pt was able to place her own order.) Nurse Communication: Mobility status;Precautions;Other (comment) (b/b incontinence, pt reluctant to consider SNF.) PT Visit Diagnosis: Other abnormalities of gait and mobility (R26.89);Muscle weakness (generalized) (M62.81)     Time: 3685-9923 PT Time Calculation (min) (ACUTE ONLY): 29 min  Charges:  $Gait Training: 8-22 mins $Therapeutic Activity: 8-22 mins                     Shi Blankenship P., PTA Acute Rehabilitation Services Secure Chat Preferred 9a-5:30pm Office: [place] 03/24/2022, 5:13 PM

## 2022-03-24 NOTE — Progress Notes (Signed)
   Subjective/Chief Complaint: Pt about the same  Still with pain but having BM  Stool noted from nurses notes    Objective: Vital signs in last 24 hours: Temp:  [98.3 F (36.8 C)-99.7 F (37.6 C)] 98.8 F (37.1 C) (01/22 0807) Pulse Rate:  [60-71] 71 (01/22 0807) Resp:  [17] 17 (01/22 0807) BP: (127-167)/(61-86) 167/86 (01/22 0807) SpO2:  [98 %-100 %] 98 % (01/22 0807) Weight:  [80 kg] 80 kg (01/22 0401) Last BM Date : 03/16/22  Intake/Output from previous day: 01/21 0701 - 01/22 0700 In: 2060.1 [P.O.:240; I.V.:1820.1] Out: 1700 [Urine:1700] Intake/Output this shift: Total I/O In: -  Out: 400 [Urine:400]  GI: soft tenderness noted throughout no peritonitis   Lab Results:  Recent Labs    03/23/22 0315  WBC 6.2  HGB 8.9*  HCT 28.3*  PLT 219   BMET Recent Labs    03/23/22 0315 03/24/22 0246  NA 137 139  K 4.0 4.1  CL 106 109  CO2 21* 21*  GLUCOSE 87 103*  BUN 7* 7*  CREATININE 0.76 0.71  CALCIUM 8.4* 8.7*   PT/INR No results for input(s): "LABPROT", "INR" in the last 72 hours. ABG No results for input(s): "PHART", "HCO3" in the last 72 hours.  Invalid input(s): "PCO2", "PO2"  Studies/Results: No results found.  Anti-infectives: Anti-infectives (From admission, onward)    Start     Dose/Rate Route Frequency Ordered Stop   03/17/22 1345  cefTRIAXone (ROCEPHIN) 1 g in sodium chloride 0.9 % 100 mL IVPB        1 g 200 mL/hr over 30 Minutes Intravenous  Once 03/17/22 1331 03/17/22 1415       Assessment/Plan:  SBO - CT with sbo with transition in mid to distal jejunum in lower abdomen - NGT to LIWS - SBO protocol with contrast in the colon    HAVING SOME BOWEL FUNCTION  Still with pain but hard to gauge her since less when distracted   Try soft diet and see how she does  Very poor operative candidate but may come to thiss     LOS: 6 days    Turner Daniels MD   TOTAL TIME 24 MIN 03/24/2022

## 2022-03-24 NOTE — TOC Progression Note (Signed)
Transition of Care [facility]) - Progression Note    Patient Details  Name: Katherine [name] MRN: [id number] Date of Birth: 01-17-1931  [facility] CM/SW Contact  Loletha Grayer Beverely Pace, RN Phone Number: 03/24/2022, 11:03 AM  Clinical Narrative:    Case manager spoke with patient concerning discharge plan. She states she does not want to go to SNF for rehab, wants to go home. Patient has aide service 4x/week, has 4 sisters and other family members that are actively involved in her care. We discussed Home Health agency options, referral called to Adela Lank, [facility] Liaison. TOC will continue to follow.    Expected Discharge Plan: [place] Barriers to Discharge: Continued Medical Work up  Expected Discharge Plan and Services In-house Referral: NA Discharge Planning Services: CM Consult Post Acute Care Choice: [place] arrangements for the past 2 months: Apartment                 DME Arranged: N/A         HH Arranged: PT, OT HH Agency: [facility] Date [place]: 03/24/22 Time [place]: 1008 Representative spoke with at [place]: Adela Lank   Social Determinants of Health (SDOH) Interventions SDOH Screenings   Tobacco Use: Medium Risk (03/17/2022)    Readmission Risk Interventions     No data to display

## 2022-03-24 NOTE — TOC Progression Note (Signed)
Transition of Care [facility]) - Progression Note    Patient Details  Name: Katherine [name] MRN: [id number] Date of Birth: 11/28/1930  Transition of Care [facility]) CM/SW [place], [place] Phone Number: 03/24/2022, 10:54 AM  Clinical Narrative:     CSW met with pt to discuss SNF recommendation. She does not want to go to SNF and instead wants to go home with [facility]. She lives alone in [place]. Pt has 4 younger sisters locally who check on her and she also had an aide 4hours/day. CSW updated RNCM who will arrange HH.    Expected Discharge Plan: [place] Barriers to Discharge: Continued Medical Work up  Expected Discharge Plan and Services                                               Social Determinants of Health (SDOH) Interventions SDOH Screenings   Tobacco Use: Medium Risk (03/17/2022)    Readmission Risk Interventions     No data to display

## 2022-03-24 NOTE — Progress Notes (Signed)
PROGRESS NOTE  Katherine [name]  DOB: [birth year]  PCP: [name], MD JSE:[id number]  DOA: 03/17/2022  LOS: 6 days  Hospital Day: 8  Brief narrative: Katherine [name] is a 89 y.o. female with PMH significant for DM2, HTN, HLD, paroxysmal A-fib not on anticoagulation, CVA on Plavix, CAD, chronic diastolic CHF, mild to moderate aortic stenosis, CKD, history of cholecystectomy, hysterectomy and subsequent history of SBO. 1/11, patient presented to the ED with complaint of nausea, vomiting, crampy abdominal pain for less than 24 hours. In the ED, patient was afebrile, heart rate in 80s, blood pressure 112/61, breathing on room air Labs with WC count 7.9, hemoglobin 11.8, serum bicarb 18, BUN/creatinine 20/0.85 Lipase and LFTs normal CT abdomen pelvis with small bowel obstruction with transition in the mid to distal jejunum within the lower abdomen. Admitted to [place] surgery consulted  Subjective: Patient was seen and examined this morning.  Lying down on bed. Continues to have abdominal pain.  Per nursing documentation, patient having hard stools.  Patient  Assessment and plan: SBO (small bowel obstruction)  History of cholecystectomy, hysterectomy Presented with nausea, vomiting, abdominal pain CT abdomen as above showed SBO.  General surgery was consulted and patient was started on conservative management. Although she is not having bowel movement, x-ray abdomen done this morning showed SBO protocol contrast in the colon and improvement of his small bowel dilation on KUB. 1/19, general surgery removed NG tube today and has started on clear liquid diet. 1/20, abdominal x-ray showed nonspecific bowel gas pattern and no significant obstruction. Currently on soft diet. Having some bowel function but has hard stool.  I offered an enema today which she does not want.  Will try suppository again today. Hopefully she will not require surgery.  Type 2 diabetes  mellitus HypOglycemia A1c 7.4 on 03/17/2022 PTA on insulin 70/30 twice daily (45 units a.m., 20 units p.m.) Currently on sliding scale insulin.  Briefly required dextrose drip because of hypoglycemia.  Currently on half normal saline only.  Blood sugar trend as below. Recent Labs  Lab 03/23/22 1151 03/23/22 1631 03/23/22 1743 03/23/22 2033 03/24/22 0808  GLUCAP 97 76 101* 130* 81   Chronic diastolic CHF  Essential hypertension Remains euvolemic on exam. PTA on amlodipine 5 mg daily, valsartan 320 mg daily Currently oral meds are on hold.  Blood pressure stable.  Hydralazine IV as needed.  Mild to moderate aortic valve stenosis Per echocardiogram from 2021. Stable. Continue outpatient follow-up with cardiology   Paroxysmal atrial fibrillation  Not on any AV nodal blocking agent as an outpatient.  Not on anticoagulation  CAD CVA, HLD PTA on Plavix, Lipitor.  Currently on hold.  CKD 3b Creatinine at baseline. Recent Labs    03/17/22 1218 03/18/22 0259 03/19/22 0515 03/20/22 0904 03/21/22 0358 03/22/22 0322 03/23/22 0315 03/24/22 0246  BUN '20 17 11 10 '$ 7* 8 7* 7*  CREATININE 0.85 0.73 0.73 0.73 0.70 0.79 0.76 0.71    Mobility: Uses walker at home.  PT eval   Goals of care   Code Status: Full Code    DVT prophylaxis:  enoxaparin (LOVENOX) injection 40 mg Start: 03/22/22 1130 SCDs Start: 03/17/22 1943   Antimicrobials: None currently Fluid: Switch to half NS at 50 mill per hour Consultants: General surgery Family Communication: None at bedside  Scheduled Meds:  bisacodyl  10 mg Rectal Once   enoxaparin (LOVENOX) injection  40 mg Subcutaneous Q24H   insulin aspart  0-5 Units Subcutaneous QHS  insulin aspart  0-9 Units Subcutaneous TID WC   polyethylene glycol  17 g Oral Daily    PRN meds: hydrALAZINE, morphine injection, ondansetron (ZOFRAN) IV   Infusions:   sodium chloride 75 mL/hr at 03/24/22 0005    Antimicrobials: Anti-infectives (From  admission, onward)    Start     Dose/Rate Route Frequency Ordered Stop   03/17/22 1345  cefTRIAXone (ROCEPHIN) 1 g in sodium chloride 0.9 % 100 mL IVPB        1 g 200 mL/hr over 30 Minutes Intravenous  Once 03/17/22 1331 03/17/22 1415       Skin assessment:      Diet:  Diet Order             DIET SOFT Room service appropriate? Yes; Fluid consistency: Thin  Diet effective now                   Nutritional status:  Body mass index is 27.62 kg/m.         Status is: Inpatient Level of care: Telemetry Medical  Continue in-hospital care because: Conservative management of bowel obstruction.  Seems to be improving.  But no good bowel movement yet and abdominal pain continues.  Hopefully will not need surgery Dispo: The patient is from: Home              Anticipated d/c is to: Pending clinical course              Patient currently is not medically stable to d/c.   Difficult to place patient No   Objective: Vitals:   03/24/22 0401 03/24/22 0807  BP: (!) 140/61 (!) 167/86  Pulse: 71 71  Resp: 17 17  Temp: 98.3 F (36.8 C) 98.8 F (37.1 C)  SpO2: 100% 98%    Intake/Output Summary (Last 24 hours) at 03/24/2022 1057 Last data filed at 03/24/2022 0810 Gross per 24 hour  Intake 1820.11 ml  Output 2100 ml  Net -279.89 ml   Filed Weights   03/22/22 0500 03/23/22 0500 03/24/22 0401  Weight: 73.4 kg 72.9 kg 80 kg   Weight change: 7.1 kg Body mass index is 27.62 kg/m.   Physical Exam: General exam: Pleasant, elderly African-American female. Skin: No rashes, lesions or ulcers. HEENT: Atraumatic, normocephalic, no obvious bleeding.   Lungs: Clear to auscultation bilaterally CVS: Regular rate and rhythm, no murmur GI/Abd soft, distention improved, continues to have abdominal tenderness.  Bowel sound present CNS: Alert, awake, oriented x 3 Psychiatry: Sad affect Extremities: No pedal edema, no calf tenderness  Data Review: I have personally reviewed the  laboratory data and studies available.  F/u labs ordered Unresulted Labs (From admission, onward)    None       Total time spent in review of labs and imaging, patient evaluation, formulation of plan, documentation and communication with family: 11 minutes  Signed, Terrilee Croak, MD [facility] 03/24/2022

## 2022-03-25 DIAGNOSIS — K56609 Unspecified intestinal obstruction, unspecified as to partial versus complete obstruction: Secondary | ICD-10-CM | POA: Diagnosis not present

## 2022-03-25 LAB — GLUCOSE, CAPILLARY
Glucose-Capillary: 133 mg/dL — ABNORMAL HIGH (ref 70–99)
Glucose-Capillary: 158 mg/dL — ABNORMAL HIGH (ref 70–99)
Glucose-Capillary: 175 mg/dL — ABNORMAL HIGH (ref 70–99)
Glucose-Capillary: 130 mg/dL — ABNORMAL HIGH (ref 70–99)

## 2022-03-25 NOTE — Evaluation (Signed)
Occupational Therapy Evaluation Patient Details Name: Katherine [name] MRN: [id number] DOB: [birth year] Today's Date: 03/25/2022   History of Present Illness LYNA LANINGHAM is a 89 y.o. female who presents with abdominal pain. CT abdomen pelvis with small bowel obstruction with transition in the mid to distal jejunum within the lower abdomen. NGT placed to suction. General surgery consulted, hopefully will resolve SBO with non-operative management. PMH: HFpEF, CAD, CVA, HTN, T2DM, paroxysmal atrial fibrillation not on oral anticoagulation, hx of hysterectomy, cholecystectomy, hx of SBO.   Clinical Impression   Pt in recliner upon therapy arrival and agreeable to participate in OT evaluation. Pt reports that prior to admit, she receives assistance from personal care attendant 7X a week, 4-6 hours a day for bathing, dressing, meal prep, housekeeping tasks. Pt reports that she is able to manage her medication independently and uses a pill box to set up her pills for the week. Family assist with financial management such as bill payments. Pill box test administered to assess executive cognitive skills related to purposive action/self regulation, planning/attention, and volition/inhibition. After 20 minutes, test was terminated. No need to finish test as patient had more than 3 errors which equalled a failed test. Reviewed errors with pt although full understanding of  not only test purpose but also test errors were not conveyed. Based on the test results, pt will have increased difficulty with any higher level executive functioning task which makes her at risk for returning home without 24/7 supervision for safety. PT recommends SNF at discharge for a short term rehab and I agree with recommendation if pt is unable to receive 24/7 supervision once home.       Recommendations for follow up therapy are one component of a multi-disciplinary discharge planning process, led by the attending physician.   Recommendations may be updated based on patient status, additional functional criteria and insurance authorization.   Follow Up Recommendations  Skilled nursing-short term rehab (<3 hours/day)     Assistance Recommended at Discharge Frequent or constant Supervision/Assistance  Patient can return home with the following A little help with walking and/or transfers;A little help with bathing/dressing/bathroom;Help with stairs or ramp for entrance;Assistance with cooking/housework;Assist for transportation;Direct supervision/assist for medications management    Functional Status Assessment  Patient has had a recent decline in their functional status and demonstrates the ability to make significant improvements in function in a reasonable and predictable amount of time.  Equipment Recommendations  None recommended by OT       Precautions / Restrictions Precautions Precautions: Fall Precaution Comments: b/b incontinence Restrictions Weight Bearing Restrictions: No          ADL either performed or assessed with clinical judgement      Vision Baseline Vision/History: 1 Wears glasses (for reading. Not with pt in hospital room. Reports that they were left at home.) Ability to See in Adequate Light: 1 Impaired Patient Visual Report: No change from baseline Vision Assessment?: No apparent visual deficits            Pertinent Vitals/Pain Pain Assessment Pain Assessment: Faces Faces Pain Scale: Hurts even more Pain Location: abdomen Pain Descriptors / Indicators: Discomfort, Grimacing, Guarding Pain Intervention(s): Limited activity within patient's tolerance, Monitored during session, Patient requesting pain meds-RN notified     Hand Dominance Right   Extremity/Trunk Assessment Upper Extremity Assessment Upper Extremity Assessment: Generalized weakness   Lower Extremity Assessment Lower Extremity Assessment: Defer to PT evaluation       Communication  Communication Communication: No difficulties  Cognition Arousal/Alertness: Awake/alert Behavior During Therapy: Flat affect Overall Cognitive Status: History of cognitive impairments - at baseline     General Comments: Patient does well with routine and normal habits based on conversation. Pt demonstrates poor insight in deficits while making excuses for errors during Pill Box Test. Extreme difficulty noted with an unfamiliar task that requires attention, problem solving, judgement, and short term memory. Pill Box Test administered during evaluation to assess executive cognitive skills required to complete BADL tasks.                Home Living Family/patient expects to be discharged to:: Private residence Living Arrangements: Alone Available Help at Discharge: Available PRN/intermittently;Personal care attendant (Personal care attendant provides assistance 7x a week; 6hrs during the week; 4hrs during saturday and sunday) Type of Home: Apartment Home Access: Level entry     Home Layout: One level     Bathroom Shower/Tub: Teacher, early years/pre: Standard Bathroom Accessibility: No   Home Equipment: Grab bars - tub/shower;Hand held shower head;Tub bench;Rolling Walker (2 wheels);Cane - single point;Wheelchair - manual          Prior Functioning/Environment Prior Level of Function : Needs assist     Mobility Comments: uses walker or cane mainly ADLs Comments: help for shower, groceries, meals, dressing LB, bathing LB, picking up medications        OT Problem List: Decreased strength;Decreased cognition;Decreased activity tolerance;Pain      OT Treatment/Interventions:   Eval only   OT Goals(Current goals can be found in the care plan section)    OT Frequency:  1 time visit       AM-PAC OT "6 Clicks" Daily Activity     Outcome Measure Help from another person eating meals?: None Help from another person taking care of personal grooming?: A  Little Help from another person toileting, which includes using toliet, bedpan, or urinal?: A Little Help from another person bathing (including washing, rinsing, drying)?: A Little Help from another person to put on and taking off regular upper body clothing?: A Little Help from another person to put on and taking off regular lower body clothing?: A Little 6 Click Score: 19   End of Session    Activity Tolerance: Patient tolerated treatment well Patient left: in chair;with call bell/phone within reach;with nursing/sitter in room  OT Visit Diagnosis: Muscle weakness (generalized) (M62.81)                Time: 5953-9672 OT Time Calculation (min): 48 min Charges:  OT General Charges $OT Visit: 1 Visit OT Evaluation $OT Eval Moderate Complexity: 1 Mod  [employer], OTR/L,CBIS  Supplemental OT - [facility] and [facility] Secure Chat Preferred    Tatsuo Musial, Clarene Duke 03/25/2022, 1:48 PM

## 2022-03-25 NOTE — Progress Notes (Signed)
PROGRESS NOTE  Katherine [name]  DOB: 1930/08/19  PCP: [name], MD IWP:[id number]  DOA: 03/17/2022  LOS: 7 days  Hospital Day: 9  Brief narrative: Katherine [name] is a 89 y.o. female with PMH significant for DM2, HTN, HLD, paroxysmal A-fib not on anticoagulation, CVA on Plavix, CAD, chronic diastolic CHF, mild to moderate aortic stenosis, CKD, history of cholecystectomy, hysterectomy and subsequent history of SBO. 1/11, patient presented to the ED with complaint of nausea, vomiting, crampy abdominal pain for less than 24 hours. In the ED, patient was afebrile, heart rate in 80s, blood pressure 112/61, breathing on room air Labs with WC count 7.9, hemoglobin 11.8, serum bicarb 18, BUN/creatinine 20/0.85 Lipase and LFTs normal CT abdomen pelvis with small bowel obstruction with transition in the mid to distal jejunum within the lower abdomen. Admitted to [facility] surgery consulted  Subjective: Patient was seen and examined this morning.  Sitting up in recliner.  Reports multiple bowel movements in last 24 hours.  Feels better and is more talkative today.  Assessment and plan: SBO (small bowel obstruction)  History of cholecystectomy, hysterectomy Presented with nausea, vomiting, abdominal pain CT abdomen as above showed SBO.  General surgery was consulted and patient was started on conservative management. Although she is not having bowel movement, x-ray abdomen done this morning showed SBO protocol contrast in the colon and improvement of his small bowel dilation on KUB. 1/19, general surgery removed NG tube today and has started on clear liquid diet. 1/20, abdominal x-ray showed nonspecific bowel gas pattern and no significant obstruction. Currently able to tolerate soft diet. Had multiple soft bowel movements in last 24 hours.  Type 2 diabetes mellitus HypOglycemia A1c 7.4 on 03/17/2022 PTA on insulin 70/30 twice daily (45 units a.m., 20 units p.m.) Currently on sliding  scale insulin.  Briefly required dextrose drip because of hypoglycemia.  Currently on half normal saline only.  Blood sugar trend as below. Recent Labs  Lab 03/24/22 1151 03/24/22 1701 03/24/22 1947 03/25/22 0758 03/25/22 1139  GLUCAP 144* 175* 183* 133* 158*   Chronic diastolic CHF  Essential hypertension Remains euvolemic on exam. PTA on amlodipine 5 mg daily, valsartan 320 mg daily Currently oral meds are on hold.  Blood pressure stable.  Hydralazine IV as needed.  Mild to moderate aortic valve stenosis Per echocardiogram from 2021. Stable. Continue outpatient follow-up with cardiology   Paroxysmal atrial fibrillation  Not on any AV nodal blocking agent as an outpatient.  Not on anticoagulation  CAD CVA, HLD PTA on Plavix, Lipitor.  Currently on hold.  CKD 3b Creatinine at baseline. Recent Labs    03/17/22 1218 03/18/22 0259 03/19/22 0515 03/20/22 0904 03/21/22 0358 03/22/22 0322 03/23/22 0315 03/24/22 0246  BUN '20 17 11 10 '$ 7* 8 7* 7*  CREATININE 0.85 0.73 0.73 0.73 0.70 0.79 0.76 0.71    Mobility: Uses walker at home.  PT eval obtained.  SNF was recommended yesterday 1/22.  Patient wants to go home ultimately.  Goals of care   Code Status: Full Code    DVT prophylaxis:  enoxaparin (LOVENOX) injection 40 mg Start: 03/22/22 1130 SCDs Start: 03/17/22 1943   Antimicrobials: None currently Fluid: Stop IV fluid today Consultants: General surgery Family Communication: None at bedside  Scheduled Meds:  enoxaparin (LOVENOX) injection  40 mg Subcutaneous Q24H   insulin aspart  0-5 Units Subcutaneous QHS   insulin aspart  0-9 Units Subcutaneous TID WC   polyethylene glycol  17 g Oral Daily  PRN meds: hydrALAZINE, morphine injection, ondansetron (ZOFRAN) IV   Infusions:     Antimicrobials: Anti-infectives (From admission, onward)    Start     Dose/Rate Route Frequency Ordered Stop   03/17/22 1345  cefTRIAXone (ROCEPHIN) 1 g in sodium chloride 0.9  % 100 mL IVPB        1 g 200 mL/hr over 30 Minutes Intravenous  Once 03/17/22 1331 03/17/22 1415       Skin assessment:      Diet:  Diet Order             DIET SOFT Room service appropriate? Yes; Fluid consistency: Thin  Diet effective now                   Nutritional status:  Body mass index is 26.83 kg/m.         Status is: Inpatient Level of care: Telemetry Medical  Continue in-hospital care because: Conservative management of bowel obstruction.  Seems to be improving. Hopefully home tomorrow Dispo: The patient is from: Home              Anticipated d/c is to: Hopefully home tomorrow.              Patient currently is not medically stable to d/c.   Difficult to place patient No   Objective: Vitals:   03/25/22 0534 03/25/22 0757  BP: (!) 159/81 (!) 143/67  Pulse: 69 (!) 58  Resp: 18 17  Temp: 99.2 F (37.3 C) 98.7 F (37.1 C)  SpO2: 100% 100%    Intake/Output Summary (Last 24 hours) at 03/25/2022 1326 Last data filed at 03/25/2022 1008 Gross per 24 hour  Intake 240 ml  Output 500 ml  Net -260 ml   Filed Weights   03/24/22 0401 03/24/22 1645 03/25/22 0534  Weight: 80 kg 74.4 kg 77.7 kg   Weight change: -5.6 kg Body mass index is 26.83 kg/m.   Physical Exam: General exam: Pleasant, elderly African-American female. Skin: No rashes, lesions or ulcers. HEENT: Atraumatic, normocephalic, no obvious bleeding.   Lungs: Clear to auscultation bilaterally CVS: Regular rate and rhythm, no murmur GI/Abd soft, distention improved, continues to have abdominal tenderness.  Bowel sound present CNS: Alert, awake, oriented x 3. Psychiatry: Sad affect Extremities: No pedal edema, no calf tenderness  Data Review: I have personally reviewed the laboratory data and studies available.  F/u labs ordered Unresulted Labs (From admission, onward)     Start     Ordered   03/26/22 4045  Basic metabolic panel  Tomorrow morning,   R       Question:  Specimen  collection method  Answer:  Lab=Lab collect   03/25/22 0840   03/26/22 0500  CBC with Differential/Platelet  Tomorrow morning,   R       Question:  Specimen collection method  Answer:  Lab=Lab collect   03/25/22 0840            Total time spent in review of labs and imaging, patient evaluation, formulation of plan, documentation and communication with family: 25 minutes  Signed, Terrilee Croak, MD [facility] 03/25/2022

## 2022-03-25 NOTE — Progress Notes (Signed)
   Subjective/Chief Complaint: Reports intermittent RUQ pain the last 12-24 hours, this is a different spot than previous. +flatus. Multiple BMs yesterday and overnight. Denies nausea or emesis.    Objective: Vital signs in last 24 hours: Temp:  [98.7 F (37.1 C)-99.2 F (37.3 C)] 98.7 F (37.1 C) (01/23 0757) Pulse Rate:  [58-77] 58 (01/23 0757) Resp:  [17-18] 17 (01/23 0757) BP: (126-159)/(64-81) 143/67 (01/23 0757) SpO2:  [100 %] 100 % (01/23 0757) Weight:  [74.4 kg-77.7 kg] 77.7 kg (01/23 0534) Last BM Date : 03/24/22  Intake/Output from previous day: 01/22 0701 - 01/23 0700 In: 240 [P.O.:240] Out: 1225 [Urine:1225] Intake/Output this shift: No intake/output data recorded.  Gen: alert, NAD Abd: soft, less distended compared to prior exam, overall nontender   Lab Results:  Recent Labs    03/23/22 0315  WBC 6.2  HGB 8.9*  HCT 28.3*  PLT 219   BMET Recent Labs    03/23/22 0315 03/24/22 0246  NA 137 139  K 4.0 4.1  CL 106 109  CO2 21* 21*  GLUCOSE 87 103*  BUN 7* 7*  CREATININE 0.76 0.71  CALCIUM 8.4* 8.7*   PT/INR No results for input(s): "LABPROT", "INR" in the last 72 hours. ABG No results for input(s): "PHART", "HCO3" in the last 72 hours.  Invalid input(s): "PCO2", "PO2"  Studies/Results: No results found.  Anti-infectives: Anti-infectives (From admission, onward)    Start     Dose/Rate Route Frequency Ordered Stop   03/17/22 1345  cefTRIAXone (ROCEPHIN) 1 g in sodium chloride 0.9 % 100 mL IVPB        1 g 200 mL/hr over 30 Minutes Intravenous  Once 03/17/22 1331 03/17/22 1415       Assessment/Plan:  SBO - CT with sbo with transition in mid to distal jejunum in lower abdomen - SBO protocol with contrast in the colon  - clinically she looks better today, more awake and talkative, less distended, still c/o intermittent abd pain but clinically not obstructed. Would monitor for another 24 hours on soft diet.      LOS: 7 days     Jill Alexanders PA-C   TOTAL TIME 20 MIN 03/25/2022

## 2022-03-26 ENCOUNTER — Inpatient Hospital Stay (HOSPITAL_COMMUNITY): Payer: Medicare Other

## 2022-03-26 DIAGNOSIS — K56609 Unspecified intestinal obstruction, unspecified as to partial versus complete obstruction: Secondary | ICD-10-CM | POA: Diagnosis not present

## 2022-03-26 LAB — CBC WITH DIFFERENTIAL/PLATELET
Abs Immature Granulocytes: 0.03 10*3/uL (ref 0.00–0.07)
Basophils Absolute: 0 10*3/uL (ref 0.0–0.1)
Basophils Relative: 1 %
Eosinophils Absolute: 0.3 10*3/uL (ref 0.0–0.5)
Eosinophils Relative: 5 %
HCT: 26.7 % — ABNORMAL LOW (ref 36.0–46.0)
Hemoglobin: 8.4 g/dL — ABNORMAL LOW (ref 12.0–15.0)
Immature Granulocytes: 1 %
Lymphocytes Relative: 39 %
Lymphs Abs: 1.9 10*3/uL (ref 0.7–4.0)
MCH: 28.6 pg (ref 26.0–34.0)
MCHC: 31.5 g/dL (ref 30.0–36.0)
MCV: 90.8 fL (ref 80.0–100.0)
Monocytes Absolute: 0.4 10*3/uL (ref 0.1–1.0)
Monocytes Relative: 9 %
Neutro Abs: 2.2 10*3/uL (ref 1.7–7.7)
Neutrophils Relative %: 45 %
Platelets: 242 10*3/uL (ref 150–400)
RBC: 2.94 MIL/uL — ABNORMAL LOW (ref 3.87–5.11)
RDW: 14.1 % (ref 11.5–15.5)
WBC: 4.7 10*3/uL (ref 4.0–10.5)
nRBC: 0 % (ref 0.0–0.2)

## 2022-03-26 LAB — BASIC METABOLIC PANEL
Anion gap: 6 (ref 5–15)
BUN: 8 mg/dL (ref 8–23)
CO2: 24 mmol/L (ref 22–32)
Calcium: 8.5 mg/dL — ABNORMAL LOW (ref 8.9–10.3)
Chloride: 110 mmol/L (ref 98–111)
Creatinine, Ser: 0.78 mg/dL (ref 0.44–1.00)
GFR, Estimated: >60 mL/min (ref >60)
Glucose, Bld: 138 mg/dL — ABNORMAL HIGH (ref 70–99)
Potassium: 4.2 mmol/L (ref 3.5–5.1)
Sodium: 140 mmol/L (ref 135–145)

## 2022-03-26 LAB — GLUCOSE, CAPILLARY
Glucose-Capillary: 119 mg/dL — ABNORMAL HIGH (ref 70–99)
Glucose-Capillary: 119 mg/dL — ABNORMAL HIGH (ref 70–99)
Glucose-Capillary: 150 mg/dL — ABNORMAL HIGH (ref 70–99)
Glucose-Capillary: 175 mg/dL — ABNORMAL HIGH (ref 70–99)

## 2022-03-26 MED ORDER — POLYETHYLENE GLYCOL 3350 17 G PO PACK
17.0000 g | PACK | Freq: Two times a day (BID) | ORAL | Status: DC
  Administered 2022-03-27: 17 g via ORAL
  Filled 2022-03-26: qty 1

## 2022-03-26 NOTE — TOC Progression Note (Signed)
Transition of Care [facility]) - Progression Note    Patient Details  Name: Katherine [name] MRN: [id number] Date of Birth: 04/16/1930  Transition of Care [facility]) CM/SW [place], [place] Phone Number: 03/26/2022, 2:41 PM  Clinical Narrative:    CSW met with pt again to confirm her plan to DC home. Pt states she has already answered that question x2, and is agreeable to going home with [facility] services. This has already been set up and confirmed with [facility]. Pt does not want CSW to contact family as she will "take care of her business". PT in room and clarified with CSW that pt has an aide for 4-6 hours a day, but is alone after that. Pt stating she has sisters that are available to help. Pt requesting [facility] transport, states someone will be there to meet her, does not clarify. Pt states she is ready to go home.  Pt appears to be oriented appropriately, answers questions appropriately. Pt has the right to make the choices she feels are best for her health. Physical therapy noting concerns for her safety at home, continuing to recommend SNF. Surgery has documented pt has been cleared to DC, they note the current recommendation continues to be for SNF. MD noted pt is medically ready, but he was advised by pt that she needs to "get things ready at home" and requested an AM discharge. MD granted delay and will DC in the AM. CSW to set up transport at DC. TOC will continue to follow for DC needs.    Expected Discharge Plan: [place] Barriers to Discharge: Barriers Resolved  Expected Discharge Plan and Services In-house Referral: NA Discharge Planning Services: CM Consult Post Acute Care Choice: [place] arrangements for the past 2 months: Apartment                 DME Arranged: N/A         HH Arranged: PT, OT HH Agency: [place] Date [place]: 03/24/22 Time [place]: 1008 Representative spoke with at [place]: Adela Lank   Social Determinants of Health (SDOH) Interventions SDOH Screenings   Tobacco Use: Medium Risk (03/17/2022)    Readmission Risk Interventions     No data to display

## 2022-03-26 NOTE — Progress Notes (Addendum)
Physical Therapy Treatment Patient Details Name: Katherine [name] MRN: [id number] DOB: November 05, 1930 Today's Date: 03/26/2022   History of Present Illness Katherine [name] is a 89 y.o. female who presents with abdominal pain. CT abdomen pelvis with small bowel obstruction with transition in the mid to distal jejunum within the lower abdomen. NGT placed to suction. General surgery consulted, hopefully will resolve SBO with non-operative management. PMH: HFpEF, CAD, CVA, HTN, T2DM, paroxysmal atrial fibrillation not on oral anticoagulation, hx of hysterectomy, cholecystectomy, hx of SBO.    PT Comments    Pt received seated EOB preparing to stand with NT present to assist. Pt with episode of bladder incontinence upon stand so PTA brought BSC in for pt safety and pt performed pivotal transfers x2 with up to [place]. Pt resistant to instruction and defers gait trial in room/hallway despite education on benefits of gait/transfer training for strengthening. Pt encouraged to consider arranging increased supervision/assist at home but not receptive to instruction. Pt continues to benefit from PT services to progress toward functional mobility goals. Continue to recommend 24/7 supervision/assist at home for safety given pt recent deconditioning and high fall risk.  Recommendations for follow up therapy are one component of a multi-disciplinary discharge planning process, led by the attending physician.  Recommendations may be updated based on patient status, additional functional criteria and insurance authorization.  Follow Up Recommendations  Skilled nursing-short term rehab (<3 hours/day) Can patient physically be transported by private vehicle: Yes (needs briefs from home for the trip)   Assistance Recommended at Discharge Frequent or constant Supervision/Assistance  Patient can return home with the following Assistance with cooking/housework;Assist for transportation;A little help with  bathing/dressing/bathroom;Help with stairs or ramp for entrance;A lot of help with walking and/or transfers;Other (comment)   Equipment Recommendations  None recommended by PT    Recommendations for Other Services       Precautions / Restrictions Precautions Precautions: Fall Precaution Comments: b/b incontinence Restrictions Weight Bearing Restrictions: No     Mobility  Bed Mobility Overal bed mobility: Needs Assistance             General bed mobility comments: pt sitting up EOB as PTA entering room    Transfers Overall transfer level: Needs assistance Equipment used: Rolling walker (2 wheels) Transfers: Sit to/from Stand Sit to Stand: From elevated surface, Min guard   Step pivot transfers: Min assist, +2 safety/equipment, From elevated surface       General transfer comment: from EOB>RW with NT as PTA entering room for safety, pt with episode bladder incontinence so PTA brought BSC closer to bedside and provided +2 assist for safety as pt pivoted to [facility]. After toileting, pt needing up to minA for sit>stand and pivotal steps to recliner chair.    Ambulation/Gait Ambulation/Gait assistance: Min assist Gait Distance (Feet): 6 Feet Assistive device: Rolling walker (2 wheels) Gait Pattern/deviations: Decreased stride length, Step-to pattern, Trunk flexed, Decreased dorsiflexion - right, Decreased dorsiflexion - left, Step-through pattern       General Gait Details: short, shuffled steps, pt reliant on RW and needs cues for proximity to device and up to minA for RW mgmt/steadying. Pt refusing longer distance gait trial after pivoting from BSC>recliner, despite encouragement, initially stating "I'm not tired", then when PTA recommended gait trial in room, pt states "I'll do it when I'm ready".      Balance Overall balance assessment: Needs assistance Sitting-balance support: Feet supported Sitting balance-Leahy Scale: Fair     Standing balance support: [employer]  on assistive device for balance Standing balance-Leahy Scale: Poor Standing balance comment: pt appears reliant on RW; limited standing tolerance although pt not willing to admit fatigue                            Cognition Arousal/Alertness: Awake/alert Behavior During Therapy: Flat affect Overall Cognitive Status: History of cognitive impairments - at baseline                                 General Comments: Pt with baseline word finding difficulties that did seem more prominent this date, pt also easily frustrated by questions about plan for DC and frequently contradicts herself. Pt states to PTA "my sisters will come help me every day or every other" then when social worker entering room at end of session, she tells them "I have support all the time" although pt states she has aide only 4-6 hours a day. Pt with poor insight into deficits and needs constant supervision/assist for safety given high fall risk and frequent incontinence. PTA recommend she obtain increased PRN Supervision/assist at home to cover times when her home aide is not present, pt not receptive to this discussion.        Exercises      General Comments General comments (skin integrity, edema, etc.): No acute s/sx distress, pt with baseline bladder incontinence, NT present to assist with hygiene for portion of session.      Pertinent Vitals/Pain Pain Assessment Pain Assessment: Faces Faces Pain Scale: Hurts little more Pain Location: abdomen Pain Descriptors / Indicators: Discomfort, Grimacing Pain Intervention(s): Limited activity within patient's tolerance, Monitored during session, Repositioned    Home Living Family/patient expects to be discharged to:: Private residence Living Arrangements: Alone                          PT Goals (current goals can now be found in the care plan section) Acute Rehab PT Goals Patient Stated Goal: To go home, less pain. PT Goal  Formulation: With patient Time For Goal Achievement: 04/02/22 Progress towards PT goals: Progressing toward goals    Frequency    Min 3X/week      PT Plan Current plan remains appropriate       AM-PAC PT "6 Clicks" Mobility   Outcome Measure  Help needed turning from your back to your side while in a flat bed without using bedrails?: A Little Help needed moving from lying on your back to sitting on the side of a flat bed without using bedrails?: A Lot (from flat bed/no rail) Help needed moving to and from a bed to a chair (including a wheelchair)?: A Little Help needed standing up from a chair using your arms (e.g., wheelchair or bedside chair)?: A Little Help needed to walk in hospital room?: A Lot (anticipated; pt refusing to attempt >56f) Help needed climbing 3-5 steps with a railing? : Total 6 Click Score: 14    End of Session Equipment Utilized During Treatment: Gait belt Activity Tolerance: Patient limited by fatigue Patient left: with call bell/phone within reach;in chair;with chair alarm set;Other (comment) (x2 warm blankets per pt request; social worker entering room to speak with the pt) Nurse Communication: Mobility status;Precautions;Other (comment) (pt continues to prefer home instead of SNF) PT Visit Diagnosis: Other abnormalities of gait and mobility (R26.89);Muscle weakness (generalized) (M62.81)  Time: 8295-6213 PT Time Calculation (min) (ACUTE ONLY): 20 min  Charges:  $Therapeutic Activity: 8-22 mins                     Fayrene Towner P., PTA Acute Rehabilitation Services Secure Chat Preferred 9a-5:30pm Office: [place] 03/26/2022, 3:23 PM

## 2022-03-26 NOTE — Progress Notes (Signed)
Mobility Specialist - Progress Note   03/26/22 1000  Mobility  Activity Refused mobility    Pt declined, stated she did not want to get up. Asked me to "leave her alone" with further encouragement. Will follow up as able.   [place] Specialist Please contact via SecureChat or Rehab office at [phone]

## 2022-03-26 NOTE — Plan of Care (Signed)

## 2022-03-26 NOTE — Progress Notes (Signed)
   Subjective/Chief Complaint: Reports intermittent RUQ pain persists. Tolerating PO but not eating much. Reports some flatus. No BM yet today. Reports diarrhea yesterday and day before.  Objective: Vital signs in last 24 hours: Temp:  [98.2 F (36.8 C)-99.2 F (37.3 C)] 99 F (37.2 C) (01/24 0740) Pulse Rate:  [57-67] 57 (01/24 0740) Resp:  [16-18] 17 (01/24 0740) BP: (125-132)/(64-87) 129/64 (01/24 0740) SpO2:  [100 %] 100 % (01/24 0740) Weight:  [79.1 kg] 79.1 kg (01/24 0554) Last BM Date : 03/25/22  Intake/Output from previous day: 01/23 0701 - 01/24 0700 In: 580 [P.O.:580] Out: -  Intake/Output this shift: No intake/output data recorded.  Gen: alert, NAD Abd: soft, less distended compared to prior exam, overall nontender   Lab Results:  Recent Labs    03/26/22 0057  WBC 4.7  HGB 8.4*  HCT 26.7*  PLT 242   BMET Recent Labs    03/24/22 0246 03/26/22 0057  NA 139 140  K 4.1 4.2  CL 109 110  CO2 21* 24  GLUCOSE 103* 138*  BUN 7* 8  CREATININE 0.71 0.78  CALCIUM 8.7* 8.5*   PT/INR No results for input(s): "LABPROT", "INR" in the last 72 hours. ABG No results for input(s): "PHART", "HCO3" in the last 72 hours.  Invalid input(s): "PCO2", "PO2"  Studies/Results: DG Abd Portable 1V  Result Date: 03/26/2022 CLINICAL DATA:  SP of EXAM: PORTABLE ABDOMEN - 1 VIEW COMPARISON:  03/21/2018 for, 03/22/2018 for FINDINGS: Moderate amount of stool in the ascending colon. Oral contrast material in the ascending and descending colon. Diverticulosis of the colon. No bowel dilatation to suggest obstruction. No evidence of pneumoperitoneum, portal venous gas or pneumatosis. No pathologic calcifications along the expected course of the ureters. No acute osseous abnormality. IMPRESSION: 1. No bowel obstruction. Electronically Signed   By: Kathreen Devoid M.D.   On: 03/26/2022 09:14    Anti-infectives: Anti-infectives (From admission, onward)    Start     Dose/Rate Route  Frequency Ordered Stop   03/17/22 1345  cefTRIAXone (ROCEPHIN) 1 g in sodium chloride 0.9 % 100 mL IVPB        1 g 200 mL/hr over 30 Minutes Intravenous  Once 03/17/22 1331 03/17/22 1415       Assessment/Plan:  SBO - CT with sbo with transition in mid to distal jejunum in lower abdomen - SBO protocol with contrast in the colon, non-obstructive pattern on KUB this AM - contrast in colon still. - with distraction this patient is nontender on abd exam, she is tolerating some PO and having bowel function. No surgical needs. Increase miralax to BID. Ok for discharge from [place] - PT recommending SNF.   CCS will sign off. Please call us back as needed.      LOS: 8 days    Katherine Alexanders PA-C   TOTAL TIME 20 MIN 03/26/2022

## 2022-03-26 NOTE — Progress Notes (Signed)
PROGRESS NOTE  Katherine [name]  DOB: Jan 31, 1931  PCP: [name], MD TZG:[id number]  DOA: 03/17/2022  LOS: 8 days  Hospital Day: 10  Brief narrative: Katherine [name] is a 89 y.o. female with PMH significant for DM2, HTN, HLD, paroxysmal A-fib not on anticoagulation, CVA on Plavix, CAD, chronic diastolic CHF, mild to moderate aortic stenosis, CKD, history of cholecystectomy, hysterectomy and subsequent history of SBO. 1/11, patient presented to the ED with complaint of nausea, vomiting, crampy abdominal pain for less than 24 hours. In the ED, patient was afebrile, heart rate in 80s, blood pressure 112/61, breathing on room air Labs with WC count 7.9, hemoglobin 11.8, serum bicarb 18, BUN/creatinine 20/0.85 Lipase and LFTs normal CT abdomen pelvis with small bowel obstruction with transition in the mid to distal jejunum within the lower abdomen. Admitted to [place] surgery consulted  Subjective: Patient was seen and examined this morning.  Propped up in bed.  Complains of mild abdominal pain.  General surgery.  Abdominal x-ray this morning which showed moderate amount of stool in the ascending colon, no obstruction.    Assessment and plan: SBO (small bowel obstruction)  History of cholecystectomy, hysterectomy Presented with nausea, vomiting, abdominal pain CT abdomen as above showed SBO.  General surgery was consulted and patient was started on conservative management. Her symptoms gradually improved.  Series of abdominal imagings were obtained which did not show recurrence of obstruction. Pain improving.  Able to tolerate diet. X-ray this morning 1/24 shows moderate amount of stool in the ascending colon.  Continue bowel regimen.  Type 2 diabetes mellitus HypOglycemia A1c 7.4 on 03/17/2022 PTA on insulin 70/30 twice daily (45 units a.m., 20 units p.m.) Currently on sliding scale insulin.  Briefly required dextrose drip because of hypoglycemia.  Blood sugars currently  controlled consistently less than 200 without any insulin. Recent Labs  Lab 03/25/22 1139 03/25/22 1613 03/25/22 2053 03/26/22 0737 03/26/22 1131  GLUCAP 158* 175* 130* 119* 119*   Chronic diastolic CHF  Essential hypertension Remains euvolemic on exam. PTA on amlodipine 5 mg daily, valsartan 320 mg daily Currently blood pressure is stable without any meds.    Mild to moderate aortic valve stenosis Per echocardiogram from 2021. Stable. Continue outpatient follow-up with cardiology   Paroxysmal atrial fibrillation  Not on any AV nodal blocking agent as an outpatient.  Not on anticoagulation  CAD CVA, HLD PTA on Plavix, Lipitor.  Currently on hold.  CKD 3b Creatinine at baseline. Recent Labs    03/17/22 1218 03/18/22 0259 03/19/22 0515 03/20/22 0904 03/21/22 0358 03/22/22 0322 03/23/22 0315 03/24/22 0246 03/26/22 0057  BUN '20 17 11 10 '$ 7* 8 7* 7* 8  CREATININE 0.85 0.73 0.73 0.73 0.70 0.79 0.76 0.71 0.78    Mobility: Uses walker at home.  PT eval obtained.  SNF was recommended but patient wanted to go home.    Goals of care   Code Status: Full Code    DVT prophylaxis:  enoxaparin (LOVENOX) injection 40 mg Start: 03/22/22 1130 SCDs Start: 03/17/22 1943   Antimicrobials: None currently Fluid: Not on IV fluid Consultants: General surgery Family Communication: None at bedside  Scheduled Meds:  enoxaparin (LOVENOX) injection  40 mg Subcutaneous Q24H   insulin aspart  0-5 Units Subcutaneous QHS   insulin aspart  0-9 Units Subcutaneous TID WC   polyethylene glycol  17 g Oral BID    PRN meds: hydrALAZINE, morphine injection, ondansetron (ZOFRAN) IV   Infusions:     Antimicrobials: Anti-infectives (  From admission, onward)    Start     Dose/Rate Route Frequency Ordered Stop   03/17/22 1345  cefTRIAXone (ROCEPHIN) 1 g in sodium chloride 0.9 % 100 mL IVPB        1 g 200 mL/hr over 30 Minutes Intravenous  Once 03/17/22 1331 03/17/22 1415       Skin  assessment:      Diet:  Diet Order             DIET SOFT Room service appropriate? Yes; Fluid consistency: Thin  Diet effective now                   Nutritional status:  Body mass index is 27.31 kg/m.         Status is: Inpatient Level of care: Telemetry Medical  Continue in-hospital care because: Patient medically stable for discharge.  She was recommended for SNF but she would rather want to go home.  She states she lives alone and has to make arrangements with her home health aide.  She is requesting a discharge tomorrow morning. Dispo: The patient is from: Home              Anticipated d/c is to: Hopefully home tomorrow.              Patient currently is medically stable to d/c.   Difficult to place patient No   Objective: Vitals:   03/26/22 0554 03/26/22 0740  BP: 125/87 129/64  Pulse: 63 (!) 57  Resp: 18 17  Temp: 98.3 F (36.8 C) 99 F (37.2 C)  SpO2: 100% 100%    Intake/Output Summary (Last 24 hours) at 03/26/2022 1446 Last data filed at 03/26/2022 1300 Gross per 24 hour  Intake 560 ml  Output --  Net 560 ml   Filed Weights   03/24/22 1645 03/25/22 0534 03/26/22 0554  Weight: 74.4 kg 77.7 kg 79.1 kg   Weight change: 4.7 kg Body mass index is 27.31 kg/m.   Physical Exam: General exam: Pleasant, elderly African-American female. Skin: No rashes, lesions or ulcers. HEENT: Atraumatic, normocephalic, no obvious bleeding.   Lungs: Clear to auscultation bilaterally CVS: Regular rate and rhythm, no murmur GI/Abd soft, abdominal tenderness and distention improved.  Bowel sound present. CNS: Alert, awake, oriented x 3. Psychiatry: Mood appropriate Extremities: No pedal edema, no calf tenderness  Data Review: I have personally reviewed the laboratory data and studies available.  F/u labs ordered Unresulted Labs (From admission, onward)    None       Total time spent in review of labs and imaging, patient evaluation, formulation of plan,  documentation and communication with family: 87 minutes  Signed, Terrilee Croak, MD [facility] 03/26/2022

## 2022-03-27 ENCOUNTER — Other Ambulatory Visit (HOSPITAL_COMMUNITY): Payer: Self-pay

## 2022-03-27 DIAGNOSIS — K56609 Unspecified intestinal obstruction, unspecified as to partial versus complete obstruction: Secondary | ICD-10-CM | POA: Diagnosis not present

## 2022-03-27 LAB — GLUCOSE, CAPILLARY
Glucose-Capillary: 133 mg/dL — ABNORMAL HIGH (ref 70–99)
Glucose-Capillary: 133 mg/dL — ABNORMAL HIGH (ref 70–99)

## 2022-03-27 MED ORDER — SENNOSIDES-DOCUSATE SODIUM 8.6-50 MG PO TABS
1.0000 | ORAL_TABLET | Freq: Every day | ORAL | 2 refills | Status: AC
  Filled 2022-03-27: qty 30, 30d supply, fill #0

## 2022-03-27 NOTE — TOC CM/SW Note (Addendum)
PT recommending walker. Patient in agreement .   Patient going home by [facility]. [facility] will not transport walker home.   [employer] can deliver walker to patient's home . NCM spoke to [place] with [employer] delivery will be 1 to 2 days. Patient aware and in agreement   [facility] called . Patient states she can get into apartment.   Katherine [name] with [facility] aware discharge today

## 2022-03-27 NOTE — Discharge Summary (Signed)
Physician Discharge Summary  Katherine [name] UTM:[id number] DOB: [birth year] DOA: 03/17/2022  PCP: [name], MD  Admit date: 03/17/2022 Discharge date: 03/27/2022  Admitted From: Home Discharge disposition: Home with home health  Brief narrative: Katherine [name] is a 89 y.o. female with PMH significant for DM2, HTN, HLD, paroxysmal A-fib not on anticoagulation, CVA on Plavix, CAD, chronic diastolic CHF, mild to moderate aortic stenosis, CKD, history of cholecystectomy, hysterectomy and subsequent history of SBO. 1/11, patient presented to the ED with complaint of nausea, vomiting, crampy abdominal pain for less than 24 hours. In the ED, patient was afebrile, heart rate in 80s, blood pressure 112/61, breathing on room air Labs with WC count 7.9, hemoglobin 11.8, serum bicarb 18, BUN/creatinine 20/0.85 Lipase and LFTs normal CT abdomen pelvis with small bowel obstruction with transition in the mid to distal jejunum within the lower abdomen. Admitted to [facility] consulted  Subjective: Patient was seen and examined this afternoon.  Sitting up in recliner.  Not in distress.  She has made arrangements today for discharge home.  Hospital course: SBO (small bowel obstruction)  History of cholecystectomy, hysterectomy Presented with nausea, vomiting, abdominal pain CT abdomen as above showed SBO.  General surgery was consulted and patient was started on conservative management. Her symptoms gradually improved.  Series of abdominal imagings were obtained which did not show recurrence of obstruction. Pain improving.  Able to tolerate diet. Most recent x-ray 1/24 shows moderate amount of stool in the ascending colon.  Continue bowel regimen.  Type 2 diabetes mellitus HypOglycemia A1c 7.4 on 03/17/2022 PTA on insulin 70/30 twice daily (45 units a.m., 20 units p.m.).  I am not sure if she was compliant with it at home or not.  Her A1c is controlled at 7.4.  While in the hospital,  she has not required any insulin coverage.  I would not resume insulin at discharge. Recent Labs  Lab 03/26/22 1131 03/26/22 1643 03/26/22 2049 03/27/22 0741 03/27/22 1157  GLUCAP 119* 150* 175* 133* 133*   Chronic diastolic CHF  Essential hypertension Remains euvolemic on exam. PTA on amlodipine 5 mg daily, valsartan 320 mg daily.  Not sure of compliance. Throughout her hospitalization, patient has not required any blood pressure medicine. No need to resume them at discharge.  Mild to moderate aortic valve stenosis Per echocardiogram from 2021. Stable. Continue outpatient follow-up with cardiology   Paroxysmal atrial fibrillation  Not on any AV nodal blocking agent as an outpatient.  Not on anticoagulation  CAD CVA, HLD PTA on Plavix, Lipitor.  Can resume post discharge.  CKD 3b Creatinine at baseline. Recent Labs    03/17/22 1218 03/18/22 0259 03/19/22 0515 03/20/22 0904 03/21/22 0358 03/22/22 0322 03/23/22 0315 03/24/22 0246 03/26/22 0057  BUN '20 17 11 10 '$ 7* 8 7* 7* 8  CREATININE 0.85 0.73 0.73 0.73 0.70 0.79 0.76 0.71 0.78    Mobility: Uses walker at home.  PT eval obtained.  SNF was recommended but patient wanted to go home.  Home health arranged.  Goals of care   Code Status: Full Code   Wounds:  - Wound / Incision (Open or Dehisced) 03/18/22 Skin tear Buttocks Left (Active)  Date First Assessed/Time First Assessed: 03/18/22 1519   Wound Type: Skin tear  Location: Buttocks  Location Orientation: Left  Present on Admission: Yes    Assessments 03/19/2022  9:30 AM 03/26/2022  8:05 AM  Dressing Type Foam - Lift dressing to assess site every shift Foam - Lift  dressing to assess site every shift  Dressing Status Clean, Dry, Intact Clean, Dry, Intact  Dressing Change Frequency PRN --  Site / Wound Assessment Pink;Red --  Peri-wound Assessment Intact --  Drainage Amount None --  Treatment Cleansed --     No associated orders.    Discharge Exam:    Vitals:   03/26/22 2042 03/27/22 0500 03/27/22 0500 03/27/22 0740  BP: (!) 154/67  (!) 118/56 (!) 133/55  Pulse: 64  (!) 53 62  Resp: '17  16 17  '$ Temp: 99.3 F (37.4 C)  98.1 F (36.7 C) 98.2 F (36.8 C)  TempSrc: Oral  Oral   SpO2: 100%  100% 99%  Weight:  78.2 kg 77.9 kg     Body mass index is 26.9 kg/m.   General exam: Pleasant, elderly African-American female. Skin: No rashes, lesions or ulcers. HEENT: Atraumatic, normocephalic, no obvious bleeding.   Lungs: Clear to auscultation bilaterally CVS: Regular rate and rhythm, no murmur GI/Abd soft, abdominal tenderness and distention improved.  Bowel sound present. CNS: Alert, awake, oriented x 3. Psychiatry: Mood appropriate Extremities: No pedal edema, no calf tenderness  Follow ups:    Follow-up Information     [name], MD Follow up.   Specialty: Cardiology Contact information: [address] [place] [address] [phone]                 Discharge Instructions:   Discharge Instructions     Call MD for:  difficulty breathing, headache or visual disturbances   Complete by: As directed    Call MD for:  extreme fatigue   Complete by: As directed    Call MD for:  hives   Complete by: As directed    Call MD for:  persistant dizziness or light-headedness   Complete by: As directed    Call MD for:  persistant nausea and vomiting   Complete by: As directed    Call MD for:  severe uncontrolled pain   Complete by: As directed    Call MD for:  temperature >100.4   Complete by: As directed    Diet general   Complete by: As directed    Discharge instructions   Complete by: As directed    General discharge instructions: Follow with Primary MD [name], MD in 7 days  Please request your PCP  to go over your hospital tests, procedures, radiology results at the follow up. Please get your medicines reviewed and adjusted.  Your PCP may decide to repeat certain labs or tests as  needed. Do not drive, operate heavy machinery, perform activities at heights, swimming or participation in water activities or provide baby sitting services if your were admitted for syncope or siezures until you have seen by Primary MD or a Neurologist and advised to do so again. [place] Controlled Substance Reporting System database was reviewed. Do not drive, operate heavy machinery, perform activities at heights, swim, participate in water activities or provide baby-sitting services while on medications for pain, sleep and mood until your outpatient physician has reevaluated you and advised to do so again.  You are strongly recommended to comply with the dose, frequency and duration of prescribed medications. Activity: As tolerated with Full fall precautions use walker/cane & assistance as needed Avoid using any recreational substances like cigarette, tobacco, alcohol, or non-prescribed drug. If you experience worsening of your admission symptoms, develop shortness of breath, life threatening emergency, suicidal or homicidal thoughts you must seek medical  attention immediately by calling 911 or calling your MD immediately  if symptoms less severe. You must read complete instructions/literature along with all the possible adverse reactions/side effects for all the medicines you take and that have been prescribed to you. Take any new medicine only after you have completely understood and accepted all the possible adverse reactions/side effects.  Wear Seat belts while driving. You were cared for by a hospitalist during your hospital stay. If you have any questions about your discharge medications or the care you received while you were in the hospital after you are discharged, you can call the unit and ask to speak with the hospitalist or the covering physician. Once you are discharged, your primary care physician will handle any further medical issues. Please note that NO REFILLS for any discharge  medications will be authorized once you are discharged, as it is imperative that you return to your primary care physician (or establish a relationship with a primary care physician if you do not have one).   Discharge wound care:   Complete by: As directed    Increase activity slowly   Complete by: As directed        Discharge Medications:   Allergies as of 03/27/2022       Reactions   Asa [aspirin] Hives   Blue Dyes (parenteral) Hives   Motrin [ibuprofen] Hives   Contrast Media [iodinated Contrast Media] Other (See Comments)   Unknown reaction   Iohexol Other (See Comments)   Unknown reaction   Shellfish Allergy Hives   Shellfish-derived Products Hives        Medication List     STOP taking these medications    amLODipine 5 MG tablet Commonly known as: NORVASC   insulin aspart protamine- aspart (70-30) 100 UNIT/ML injection Commonly known as: NOVOLOG MIX 70/30   torsemide 20 MG tablet Commonly known as: DEMADEX       TAKE these medications    allopurinol 100 MG tablet Commonly known as: ZYLOPRIM Take 100 mg by mouth in the morning.   atorvastatin 40 MG tablet Commonly known as: LIPITOR Take 40 mg by mouth in the morning. What changed: Another medication with the same name was removed. Continue taking this medication, and follow the directions you see here.   bimatoprost 0.01 % Soln Commonly known as: LUMIGAN Place 1 drop into both eyes in the morning.   brimonidine 0.1 % Soln Commonly known as: ALPHAGAN P Place 1 drop into both eyes in the morning.   clopidogrel 75 MG tablet Commonly known as: PLAVIX Take 1 tablet (75 mg total) by mouth daily. What changed: when to take this   dorzolamide 2 % ophthalmic solution Commonly known as: TRUSOPT Place 1 drop into both eyes in the morning.   ergocalciferol 1.25 MG (50000 UT) capsule Commonly known as: VITAMIN D2 Take 50,000 Units by mouth every Saturday.   estradiol 0.1 MG/GM vaginal  cream Commonly known as: ESTRACE Place 1 Applicatorful vaginally daily.   nitroGLYCERIN 0.4 mg/hr patch Commonly known as: NITRODUR - Dosed in mg/24 hr Place 0.4 mg onto the skin in the morning.   omeprazole 40 MG capsule Commonly known as: PRILOSEC Take 1 capsule (40 mg total) by mouth daily. What changed: when to take this   Rocklatan 0.02-0.005 % Soln Generic drug: Netarsudil-Latanoprost Place 1 drop into both eyes at bedtime.   senna-docusate 8.6-50 MG tablet Commonly known as: Senokot-S Take 1 tablet by mouth daily.  Discharge Care Instructions  (From admission, onward)           Start     Ordered   03/27/22 0000  Discharge wound care:        03/27/22 1246             The results of significant diagnostics from this hospitalization (including imaging, microbiology, ancillary and laboratory) are listed below for reference.    Procedures and Diagnostic Studies:   DG Abd Portable 1V-Small Bowel Obstruction Protocol-initial, 8 hr delay  Result Date: 03/18/2022 CLINICAL DATA:  Small bowel obstruction EXAM: PORTABLE ABDOMEN - 1 VIEW COMPARISON:  Abdominal radiograph dated March 18, 2022 obtained at 9:32 a.m. FINDINGS: Unchanged position of enteric tube with tip in the stomach. Unchanged dilated loops of small bowel. IVC filter noted. Contrast material seen in the large bowel. IMPRESSION: Unchanged dilated loops of small bowel, consistent with history of small-bowel obstruction. Electronically Signed   By: Yetta Glassman M.D.   On: 03/18/2022 18:38   DG Abd Portable 1V  Result Date: 03/18/2022 CLINICAL DATA:  Small-bowel obstruction. Nasogastric tube placement. EXAM: PORTABLE ABDOMEN - 1 VIEW COMPARISON:  Radiographs 03/17/2022.  CT 03/17/2022. FINDINGS: 0932 hours. Enteric tube appears unchanged, projecting over the left subphrenic area, likely in the proximal stomach. Side hole is near the gastroesophageal junction. Unchanged dilated small  bowel in the left mid abdomen. IVC filter noted. IMPRESSION: Enteric tube appears unchanged in position, likely in the proximal stomach. Electronically Signed   By: Richardean Sale M.D.   On: 03/18/2022 09:50   DG Abd Portable 1 View  Result Date: 03/17/2022 CLINICAL DATA:  NG tube placement EXAM: PORTABLE ABDOMEN - 1 VIEW limited for tube placement COMPARISON:  03/17/2022 at 5:36 p.m. Eastern standard time FINDINGS: Interval advancement of the enteric tube now with tip more towards the fundus and side hole overlying the upper stomach. Persistent air-filled dilated loops of bowel elsewhere. Limited x-ray for tube placement IMPRESSION: Interval advancement of the NG tube now with side hole overlying the upper stomach Electronically Signed   By: Jill Side M.D.   On: 03/17/2022 19:06   DG Abd Portable 1 View  Result Date: 03/17/2022 CLINICAL DATA:  NG tube placement EXAM: PORTABLE ABDOMEN - 1 VIEW COMPARISON:  CT 03/17/2022 earlier FINDINGS: Limited portable x-ray of the lower chest and upper abdomen demonstrates enteric tube in place with the side hole at the GE junction. This could be advanced further into the stomach. Otherwise there is gas along prominent loops of bowel in the upper abdomen. Please correlate with prior CT. Mild left basilar atelectasis or scar IMPRESSION: Enteric tube with side hole at the GE junction. This could be advanced further into the stomach. Limited x-ray for tube placement Electronically Signed   By: Jill Side M.D.   On: 03/17/2022 17:41   CT ABDOMEN PELVIS W CONTRAST  Result Date: 03/17/2022 CLINICAL DATA:  Generalized abdominal pain, nausea and vomiting since yesterday EXAM: CT ABDOMEN AND PELVIS WITH CONTRAST TECHNIQUE: Multidetector CT imaging of the abdomen and pelvis was performed using the standard protocol following bolus administration of intravenous contrast. RADIATION DOSE REDUCTION: This exam was performed according to the departmental dose-optimization  program which includes automated exposure control, adjustment of the mA and/or kV according to patient size and/or use of iterative reconstruction technique. CONTRAST:  61m OMNIPAQUE IOHEXOL 350 MG/ML SOLN COMPARISON:  11/29/2019 FINDINGS: Lower chest: No acute pleural or parenchymal lung disease. Hepatobiliary: No focal liver abnormality is seen. Status  post cholecystectomy. No biliary dilatation. Pancreas: Unremarkable. No pancreatic ductal dilatation or surrounding inflammatory changes. Spleen: Normal in size without focal abnormality. Adrenals/Urinary Tract: Gas in the bladder lumen, please correlate with any recent catheterization. No bladder wall thickening or perivesicular fat stranding. The kidneys enhance normally and symmetrically. No urinary tract calculi or obstruction. The adrenals are unremarkable. Stomach/Bowel: Distended proximal small bowel measuring up to 3 cm in diameter, with numerous gas fluid levels. Transition within the mid to distal jejunum in the lower central abdomen, consistent with small-bowel obstruction. Fluid-filled esophagus may reflect reflux. Vascular/Lymphatic: Aortic atherosclerosis. IVC filter. No pathologic adenopathy. Reproductive: Status post hysterectomy. No adnexal masses. Other: No free fluid or free intraperitoneal gas. No abdominal wall hernia. Musculoskeletal: No acute or destructive bony lesions. Reconstructed images demonstrate no additional findings. IMPRESSION: 1. Small bowel obstruction, with transition in the mid to distal jejunum within the lower abdomen. 2. Fluid within the distal esophagus may reflect reflux. 3.  Aortic Atherosclerosis (ICD10-I70.0). 4. Gas in the bladder lumen, please correlate with any recent catheterization. Electronically Signed   By: Randa Ngo M.D.   On: 03/17/2022 16:34     Labs:   Basic Metabolic Panel: Recent Labs  Lab 03/21/22 0358 03/22/22 0322 03/23/22 0315 03/24/22 0246 03/26/22 0057  NA 145 139 137 139 140  K  3.0* 3.4* 4.0 4.1 4.2  CL 110 108 106 109 110  CO2 27 25 21* 21* 24  GLUCOSE 163* 127* 87 103* 138*  BUN 7* 8 7* 7* 8  CREATININE 0.70 0.79 0.76 0.71 0.78  CALCIUM 8.9 8.2* 8.4* 8.7* 8.5*  MG  --  1.7  --  1.8  --    GFR CrCl cannot be calculated (Unknown ideal weight.). Liver Function Tests: No results for input(s): "AST", "ALT", "ALKPHOS", "BILITOT", "PROT", "ALBUMIN" in the last 168 hours. No results for input(s): "LIPASE", "AMYLASE" in the last 168 hours. No results for input(s): "AMMONIA" in the last 168 hours. Coagulation profile No results for input(s): "INR", "PROTIME" in the last 168 hours.  CBC: Recent Labs  Lab 03/21/22 0358 03/23/22 0315 03/26/22 0057  WBC 6.7 6.2 4.7  NEUTROABS 4.3  --  2.2  HGB 10.2* 8.9* 8.4*  HCT 32.0* 28.3* 26.7*  MCV 89.9 92.2 90.8  PLT 247 219 242   Cardiac Enzymes: No results for input(s): "CKTOTAL", "CKMB", "CKMBINDEX", "TROPONINI" in the last 168 hours. BNP: Invalid input(s): "POCBNP" CBG: Recent Labs  Lab 03/26/22 1131 03/26/22 1643 03/26/22 2049 03/27/22 0741 03/27/22 1157  GLUCAP 119* 150* 175* 133* 133*   D-Dimer No results for input(s): "DDIMER" in the last 72 hours. Hgb A1c No results for input(s): "HGBA1C" in the last 72 hours. Lipid Profile No results for input(s): "CHOL", "HDL", "LDLCALC", "TRIG", "CHOLHDL", "LDLDIRECT" in the last 72 hours. Thyroid function studies No results for input(s): "TSH", "T4TOTAL", "T3FREE", "THYROIDAB" in the last 72 hours.  Invalid input(s): "FREET3" Anemia work up No results for input(s): "VITAMINB12", "FOLATE", "FERRITIN", "TIBC", "IRON", "RETICCTPCT" in the last 72 hours. Microbiology No results found for this or any previous visit (from the past 240 hour(s)).  Time coordinating discharge: 35 minutes  Signed: Edy Belt  [facility] 03/27/2022, 12:46 PM

## 2022-03-27 NOTE — Progress Notes (Signed)
Physical Therapy Treatment Patient Details Name: Katherine [name] MRN: [id number] DOB: 22-Dec-1930 Today's Date: 03/27/2022   History of Present Illness Katherine [name] is a 89 y.o. female who presents with abdominal pain. CT abdomen pelvis with small bowel obstruction with transition in the mid to distal jejunum within the lower abdomen. NGT placed to suction. General surgery consulted, hopefully will resolve SBO with non-operative management. PMH: HFpEF, CAD, CVA, HTN, T2DM, paroxysmal atrial fibrillation not on oral anticoagulation, hx of hysterectomy, cholecystectomy, hx of SBO.    PT Comments    Pt received in supine, agreeable to therapy session, pt more calm/cooperative this session and with improved activity tolerance/participation compared with previous session. Pt with improved word finding this date and able to state that she does not have a suitable RW at home, case mgr/MD notified she will need RW for home as she is still reliant on BUE support for gait. Pt needing mostly min guard to Supervision for functional mobility tasks with continued safety cues and set-up assist for personal hygiene tasks. Pt performed short household distance gait trial with RW and no acute s/sx distress. DME recommendations discussed with pt and supervising PT Cyndi W and updated below.   Recommendations for follow up therapy are one component of a multi-disciplinary discharge planning process, led by the attending physician.  Recommendations may be updated based on patient status, additional functional criteria and insurance authorization.  Follow Up Recommendations  Skilled nursing-short term rehab (<3 hours/day) (max HH services if she refuses SNF) Can patient physically be transported by private vehicle: Yes (needs briefs from home for the trip)   Assistance Recommended at Discharge Frequent or constant Supervision/Assistance  Patient can return home with the following Assistance with cooking/housework;Assist  for transportation;A little help with bathing/dressing/bathroom;Help with stairs or ramp for entrance;A lot of help with walking and/or transfers;Other (comment);Direct supervision/assist for medications management;Direct supervision/assist for financial management   Equipment Recommendations  Rolling walker (2 wheels) (pt states she has a 3in1 BSC at home but now states she does not have a functional RW)    Recommendations for Other Services       Precautions / Restrictions Precautions Precautions: Fall Precaution Comments: b/b incontinence Restrictions Weight Bearing Restrictions: No     Mobility  Bed Mobility Overal bed mobility: Needs Assistance Bed Mobility: Supine to Sit     Supine to sit: Supervision, HOB elevated     General bed mobility comments: no assist needed, pt using bed rails; sitting up to R edge of bed    Transfers Overall transfer level: Needs assistance Equipment used: Rolling walker (2 wheels) Transfers: Sit to/from Stand Sit to Stand: Supervision           General transfer comment: EOB>RW, pt able to perform with only minimal cueing, purposely avoided min guard/physical assist as pt will be alone >50% of the day upon DC, pt steadier today and no LOB with transfers from EOB, toilet heights. Cues for safe hand placement with stand>sit transfers only; No urinary incontinence upon standing today but did have some bowel incontinence (small amount) on bed/purewick visible, pt agreeable to using toilet prior to gait trial.    Ambulation/Gait Ambulation/Gait assistance: Supervision, Min guard Gait Distance (Feet): 35 Feet (97f to toilet, then 31fin room) Assistive device: Rolling walker (2 wheels) Gait Pattern/deviations: Decreased stride length, Step-to pattern, Decreased dorsiflexion - right, Decreased dorsiflexion - left, Step-through pattern Gait velocity: grossly <0.4 m/s; limited distance in room     General Gait  Details: Pt reliant on RW and  given min cues for posture/RW proximity and tends to hold it outside her BOS, pt not receptive to cues but less agitated today when cues given. No overt LOB using RW today and able to progress gait distance in the room with min encouragement.     Balance Overall balance assessment: Needs assistance Sitting-balance support: Feet supported Sitting balance-Leahy Scale: Good     Standing balance support: Reliant on assistive device for balance Standing balance-Leahy Scale: Poor Standing balance comment: pt able to static stand and wash hands at sink with 0-1 UE support and cues on where to find soap. RW dependent for dynamic standing tasks/safety                            Cognition Arousal/Alertness: Awake/alert Behavior During Therapy: Flat affect Overall Cognitive Status: History of cognitive impairments - at baseline                                 General Comments: Pt with baseline word finding difficulties but slightly improved this date compared with previous session. Pt more cooperative with goal-directed tasks this date, therapist defers orientation questions and disposition questions as this has been increasing her agitation/non-compliance. Pt seeming more oriented during conversation today compared with previously and able to state that she needs a RW, she says "I'm not sure about the walker I had before, I need one like this" (pointing to RW). Case mgmt/RN notified.        Exercises      General Comments General comments (skin integrity, edema, etc.): Some previous bowel incontinence on purewick once pt stood from bed, pt able to perform her own peri-care seated/standing in bathroom but needed PTA to obtain washcloths/soap for her and set her up for this task. Some assist for peri-care completion prior to re-applying new purewick, pt also given mesh briefs/pad to keep purewick in place in chair and in case of bowel incontinence after session. Pt did have  small BM while toileting, RN notified as she came in for SBO.      Pertinent Vitals/Pain Pain Assessment Pain Assessment: Faces Faces Pain Scale: Hurts little more Pain Location: abdomen Pain Descriptors / Indicators: Discomfort, Guarding Pain Intervention(s): Monitored during session, Repositioned (pt had IV morphine ~9am, per RN no PO meds available to her just IV morphine)     PT Goals (current goals can now be found in the care plan section) Acute Rehab PT Goals Patient Stated Goal: To go home, less pain. PT Goal Formulation: With patient Time For Goal Achievement: 04/02/22 Progress towards PT goals: Progressing toward goals    Frequency    Min 3X/week      PT Plan Current plan remains appropriate       AM-PAC PT "6 Clicks" Mobility   Outcome Measure  Help needed turning from your back to your side while in a flat bed without using bedrails?: A Little Help needed moving from lying on your back to sitting on the side of a flat bed without using bedrails?: A Lot (from flat bed/no rail) Help needed moving to and from a bed to a chair (including a wheelchair)?: A Little Help needed standing up from a chair using your arms (e.g., wheelchair or bedside chair)?: A Little Help needed to walk in hospital room?: A Little Help needed climbing 3-5 steps with  a railing? : A Lot 6 Click Score: 16    End of Session Equipment Utilized During Treatment: Gait belt Activity Tolerance: Patient tolerated treatment well Patient left: with call bell/phone within reach;in chair;with chair alarm set;Other (comment) (x2 warm blankets per pt request; dinner tray arriving at end of session, set up tray table in front of her to eat, pt arranging her own tray.) Nurse Communication: Mobility status;Other (comment) (pt states now that she needs a RW for home) PT Visit Diagnosis: Other abnormalities of gait and mobility (R26.89);Muscle weakness (generalized) (M62.81)     Time: 1203-1228 PT  Time Calculation (min) (ACUTE ONLY): 25 min  Charges:  $Gait Training: 8-22 mins $Therapeutic Activity: 8-22 mins                     Darrian Goodwill P., PTA Acute Rehabilitation Services Secure Chat Preferred 9a-5:30pm Office: [place] 03/27/2022, 2:00 PM

## 2022-03-27 NOTE — Care Management Important Message (Signed)
Important Message  Patient Details  Name: Katherine [name] MRN: [id number] Date of Birth: [birth year]   Medicare Important Message Given:  Yes     Hannah Beat 03/27/2022, 11:26 AM

## 2022-05-12 ENCOUNTER — Other Ambulatory Visit (HOSPITAL_COMMUNITY): Payer: Self-pay

## 2022-11-04 ENCOUNTER — Emergency Department (HOSPITAL_COMMUNITY): Payer: Medicare HMO

## 2022-11-04 ENCOUNTER — Inpatient Hospital Stay (HOSPITAL_COMMUNITY)
Admission: EM | Admit: 2022-11-04 | Discharge: 2022-11-06 | DRG: 389 | Disposition: A | Discharge status: Home / Self Care | Payer: Medicare HMO | Attending: Internal Medicine | Admitting: Internal Medicine

## 2022-11-04 ENCOUNTER — Other Ambulatory Visit: Payer: Self-pay

## 2022-11-04 ENCOUNTER — Encounter (HOSPITAL_COMMUNITY): Payer: Self-pay

## 2022-11-04 DIAGNOSIS — Z9049 Acquired absence of other specified parts of digestive tract: Secondary | ICD-10-CM

## 2022-11-04 DIAGNOSIS — I10 Essential (primary) hypertension: Secondary | ICD-10-CM | POA: Diagnosis present

## 2022-11-04 DIAGNOSIS — N179 Acute kidney failure, unspecified: Secondary | ICD-10-CM

## 2022-11-04 DIAGNOSIS — I060 Rheumatic aortic stenosis: Secondary | ICD-10-CM | POA: Diagnosis present

## 2022-11-04 DIAGNOSIS — K56609 Unspecified intestinal obstruction, unspecified as to partial versus complete obstruction: Secondary | ICD-10-CM | POA: Diagnosis present

## 2022-11-04 DIAGNOSIS — M1000 Idiopathic gout, unspecified site: Secondary | ICD-10-CM

## 2022-11-04 DIAGNOSIS — D631 Anemia in chronic kidney disease: Secondary | ICD-10-CM | POA: Diagnosis present

## 2022-11-04 DIAGNOSIS — D649 Anemia, unspecified: Secondary | ICD-10-CM | POA: Insufficient documentation

## 2022-11-04 DIAGNOSIS — Z8679 Personal history of other diseases of the circulatory system: Secondary | ICD-10-CM

## 2022-11-04 DIAGNOSIS — Z8673 Personal history of transient ischemic attack (TIA), and cerebral infarction without residual deficits: Secondary | ICD-10-CM

## 2022-11-04 DIAGNOSIS — Z79899 Other long term (current) drug therapy: Secondary | ICD-10-CM

## 2022-11-04 DIAGNOSIS — K56600 Partial intestinal obstruction, unspecified as to cause: Principal | ICD-10-CM | POA: Diagnosis present

## 2022-11-04 DIAGNOSIS — I130 Hypertensive heart and chronic kidney disease with heart failure and stage 1 through stage 4 chronic kidney disease, or unspecified chronic kidney disease: Secondary | ICD-10-CM | POA: Diagnosis present

## 2022-11-04 DIAGNOSIS — E119 Type 2 diabetes mellitus without complications: Secondary | ICD-10-CM

## 2022-11-04 DIAGNOSIS — Z8249 Family history of ischemic heart disease and other diseases of the circulatory system: Secondary | ICD-10-CM

## 2022-11-04 DIAGNOSIS — Z7982 Long term (current) use of aspirin: Secondary | ICD-10-CM

## 2022-11-04 DIAGNOSIS — R109 Unspecified abdominal pain: Secondary | ICD-10-CM | POA: Diagnosis not present

## 2022-11-04 DIAGNOSIS — Z91041 Radiographic dye allergy status: Secondary | ICD-10-CM

## 2022-11-04 DIAGNOSIS — E785 Hyperlipidemia, unspecified: Secondary | ICD-10-CM | POA: Diagnosis present

## 2022-11-04 DIAGNOSIS — Z886 Allergy status to analgesic agent status: Secondary | ICD-10-CM

## 2022-11-04 DIAGNOSIS — Z91013 Allergy to seafood: Secondary | ICD-10-CM

## 2022-11-04 DIAGNOSIS — Z7902 Long term (current) use of antithrombotics/antiplatelets: Secondary | ICD-10-CM

## 2022-11-04 DIAGNOSIS — I480 Paroxysmal atrial fibrillation: Secondary | ICD-10-CM | POA: Diagnosis present

## 2022-11-04 DIAGNOSIS — E1165 Type 2 diabetes mellitus with hyperglycemia: Secondary | ICD-10-CM | POA: Diagnosis present

## 2022-11-04 DIAGNOSIS — I2510 Atherosclerotic heart disease of native coronary artery without angina pectoris: Secondary | ICD-10-CM | POA: Diagnosis present

## 2022-11-04 DIAGNOSIS — Z87891 Personal history of nicotine dependence: Secondary | ICD-10-CM

## 2022-11-04 DIAGNOSIS — Z7984 Long term (current) use of oral hypoglycemic drugs: Secondary | ICD-10-CM

## 2022-11-04 DIAGNOSIS — R0609 Other forms of dyspnea: Secondary | ICD-10-CM | POA: Insufficient documentation

## 2022-11-04 DIAGNOSIS — E1122 Type 2 diabetes mellitus with diabetic chronic kidney disease: Secondary | ICD-10-CM | POA: Diagnosis present

## 2022-11-04 DIAGNOSIS — I5032 Chronic diastolic (congestive) heart failure: Secondary | ICD-10-CM | POA: Diagnosis present

## 2022-11-04 DIAGNOSIS — N183 Chronic kidney disease, stage 3 unspecified: Secondary | ICD-10-CM | POA: Insufficient documentation

## 2022-11-04 DIAGNOSIS — N1831 Chronic kidney disease, stage 3a: Secondary | ICD-10-CM | POA: Diagnosis present

## 2022-11-04 DIAGNOSIS — M109 Gout, unspecified: Secondary | ICD-10-CM | POA: Insufficient documentation

## 2022-11-04 DIAGNOSIS — Z91048 Other nonmedicinal substance allergy status: Secondary | ICD-10-CM

## 2022-11-04 DIAGNOSIS — Z90710 Acquired absence of both cervix and uterus: Secondary | ICD-10-CM

## 2022-11-04 DIAGNOSIS — Z8669 Personal history of other diseases of the nervous system and sense organs: Secondary | ICD-10-CM

## 2022-11-04 DIAGNOSIS — H409 Unspecified glaucoma: Secondary | ICD-10-CM | POA: Diagnosis present

## 2022-11-04 LAB — CBC WITH DIFFERENTIAL/PLATELET
Abs Immature Granulocytes: 0.01 10*3/uL (ref 0.00–0.07)
Basophils Absolute: 0 10*3/uL (ref 0.0–0.1)
Basophils Relative: 0 %
Eosinophils Absolute: 0 10*3/uL (ref 0.0–0.5)
Eosinophils Relative: 0 %
HCT: 36.6 % (ref 36.0–46.0)
Hemoglobin: 11.3 g/dL — ABNORMAL LOW (ref 12.0–15.0)
Immature Granulocytes: 0 %
Lymphocytes Relative: 14 %
Lymphs Abs: 0.8 10*3/uL (ref 0.7–4.0)
MCH: 28.3 pg (ref 26.0–34.0)
MCHC: 30.9 g/dL (ref 30.0–36.0)
MCV: 91.7 fL (ref 80.0–100.0)
Monocytes Absolute: 0.3 10*3/uL (ref 0.1–1.0)
Monocytes Relative: 6 %
Neutro Abs: 4.6 10*3/uL (ref 1.7–7.7)
Neutrophils Relative %: 80 %
Platelets: 256 10*3/uL (ref 150–400)
RBC: 3.99 MIL/uL (ref 3.87–5.11)
RDW: 14.5 % (ref 11.5–15.5)
WBC: 5.7 10*3/uL (ref 4.0–10.5)
nRBC: 0 % (ref 0.0–0.2)

## 2022-11-04 LAB — COMPREHENSIVE METABOLIC PANEL
ALT: 14 U/L (ref 0–44)
AST: 22 U/L (ref 15–41)
Albumin: 3.9 g/dL (ref 3.5–5.0)
Alkaline Phosphatase: 88 U/L (ref 38–126)
Anion gap: 15 (ref 5–15)
BUN: 34 mg/dL — ABNORMAL HIGH (ref 8–23)
CO2: 20 mmol/L — ABNORMAL LOW (ref 22–32)
Calcium: 9.8 mg/dL (ref 8.9–10.3)
Chloride: 103 mmol/L (ref 98–111)
Creatinine, Ser: 1.39 mg/dL — ABNORMAL HIGH (ref 0.44–1.00)
GFR, Estimated: 36 mL/min — ABNORMAL LOW (ref >60)
Glucose, Bld: 255 mg/dL — ABNORMAL HIGH (ref 70–99)
Potassium: 4.1 mmol/L (ref 3.5–5.1)
Sodium: 138 mmol/L (ref 135–145)
Total Bilirubin: 0.6 mg/dL (ref 0.3–1.2)
Total Protein: 7.7 g/dL (ref 6.5–8.1)

## 2022-11-04 LAB — LIPASE, BLOOD: Lipase: 28 U/L (ref 11–51)

## 2022-11-04 MED ORDER — DIPHENHYDRAMINE HCL 25 MG PO CAPS: 50.0000 mg | ORAL_CAPSULE | Freq: Once | ORAL | Status: AC

## 2022-11-04 MED ORDER — METHYLPREDNISOLONE SODIUM SUCC 40 MG IJ SOLR
40.0000 mg | Freq: Once | INTRAMUSCULAR | Status: AC
  Administered 2022-11-04: 40 mg via INTRAVENOUS
  Filled 2022-11-04: qty 1

## 2022-11-04 MED ORDER — DIPHENHYDRAMINE HCL 50 MG/ML IJ SOLN
50.0000 mg | Freq: Once | INTRAMUSCULAR | Status: AC
  Administered 2022-11-04: 50 mg via INTRAVENOUS
  Filled 2022-11-04: qty 1

## 2022-11-04 MED ORDER — SODIUM CHLORIDE 0.9 % IV BOLUS
500.0000 mL | Freq: Once | INTRAVENOUS | Status: AC
  Administered 2022-11-04: 500 mL via INTRAVENOUS

## 2022-11-04 MED ORDER — ONDANSETRON HCL 4 MG/2ML IJ SOLN
4.0000 mg | Freq: Four times a day (QID) | INTRAMUSCULAR | Status: DC | PRN
  Administered 2022-11-04 – 2022-11-06 (×2): 4 mg via INTRAVENOUS
  Filled 2022-11-04 (×2): qty 2

## 2022-11-04 NOTE — ED Provider Notes (Signed)
[place] EMERGENCY DEPARTMENT AT [facility] Provider Note   CSN: [id number] Arrival date & time: 11/04/22  1238     History  No chief complaint on file.   Katherine [name] is a 89 y.o. female with PMHx SBO, afib, CAD, CHF, DM, HTN, stroke who presents to ED concerned for abdominal pain x4 days. Patient also with nausea, vomiting, and diarrhea since late last night. Abdominal pain is central/umbilical and radiating throughout abdomen. Hx of SBO in 03/2022 that was treated without surgical intervention per patient.  Denies fever, hematemesis, hematochezia, dysuria, hematuria.  HPI     Home Medications Prior to Admission medications   Medication Sig Start Date End Date Taking? Authorizing Provider  allopurinol (ZYLOPRIM) 100 MG tablet Take 100 mg by mouth in the morning. 12/31/21   [provider]  atorvastatin (LIPITOR) 40 MG tablet Take 40 mg by mouth in the morning. 02/22/22   [provider]  bimatoprost (LUMIGAN) 0.01 % SOLN Place 1 drop into both eyes in the morning.    [provider]  brimonidine (ALPHAGAN P) 0.1 % SOLN Place 1 drop into both eyes in the morning.    [provider]  clopidogrel (PLAVIX) 75 MG tablet Take 1 tablet (75 mg total) by mouth daily. Patient taking differently: Take 75 mg by mouth in the morning. 06/04/17   Rinaldo Cloud, MD  dorzolamide (TRUSOPT) 2 % ophthalmic solution Place 1 drop into both eyes in the morning.    [provider]  ergocalciferol (VITAMIN D2) 50000 units capsule Take 50,000 Units by mouth every Saturday.     [provider]  estradiol (ESTRACE) 0.1 MG/GM vaginal cream Place 1 Applicatorful vaginally daily. 02/28/22   [provider]  Netarsudil-Latanoprost (ROCKLATAN) 0.02-0.005 % SOLN Place 1 drop into both eyes at bedtime.    [provider]  nitroGLYCERIN (NITRODUR - DOSED IN MG/24 HR) 0.4 mg/hr patch Place 0.4 mg onto the skin in the morning.     [provider]  omeprazole (PRILOSEC) 40 MG capsule Take 1 capsule (40 mg total) by mouth daily. Patient taking differently: Take 40 mg by mouth in the morning. 01/25/20   Rinaldo Cloud, MD      Allergies    Asa [aspirin], Blue dyes (parenteral), Motrin [ibuprofen], Contrast media [iodinated contrast media], Iohexol, Shellfish allergy, and Shellfish-derived products    Review of Systems   Review of Systems  Gastrointestinal:  Positive for abdominal pain.    Physical Exam Updated Vital Signs BP (!) 152/69 (BP Location: Left Arm)   Pulse 76   Temp 99.5 F (37.5 C) (Oral)   Resp 18   SpO2 100%  Physical Exam Vitals and nursing note reviewed.  Constitutional:      General: She is not in acute distress.    Appearance: She is not ill-appearing or toxic-appearing.  HENT:     Head: Normocephalic and atraumatic.     Mouth/Throat:     Mouth: Mucous membranes are moist.  Eyes:     General: No scleral icterus.       Right eye: No discharge.        Left eye: No discharge.     Conjunctiva/sclera: Conjunctivae normal.  Cardiovascular:     Rate and Rhythm: Normal rate and regular rhythm.     Pulses: Normal pulses.     Heart sounds: Normal heart sounds. No murmur heard.    Comments: No JVD or pitting edema Pulmonary:     Effort:  Pulmonary effort is normal.  Abdominal:     General: Abdomen is flat. Bowel sounds are normal.     Palpations: Abdomen is soft.     Comments: generalized tenderness to palpation. Patient not in acute distress.  Musculoskeletal:     Right lower leg: No edema.     Left lower leg: No edema.  Skin:    General: Skin is warm and dry.     Findings: No rash.  Neurological:     General: No focal deficit present.     Mental Status: She is alert. Mental status is at baseline.  Psychiatric:        Mood and Affect: Mood normal.        Behavior: Behavior normal.     ED Results / Procedures / Treatments   Labs (all labs ordered are listed, but only  abnormal results are displayed) Labs Reviewed  COMPREHENSIVE METABOLIC PANEL - Abnormal; Notable for the following components:      Result Value   CO2 20 (*)    Glucose, Bld 255 (*)    BUN 34 (*)    Creatinine, Ser 1.39 (*)    GFR, Estimated 36 (*)    All other components within normal limits  CBC WITH DIFFERENTIAL/PLATELET - Abnormal; Notable for the following components:   Hemoglobin 11.3 (*)    All other components within normal limits  LIPASE, BLOOD    EKG None  Radiology No results found.  Procedures Procedures    Medications Ordered in ED Medications  diphenhydrAMINE (BENADRYL) capsule 50 mg (has no administration in time range)    Or  diphenhydrAMINE (BENADRYL) injection 50 mg (has no administration in time range)  methylPREDNISolone sodium succinate (SOLU-MEDROL) 40 mg/mL injection 40 mg (has no administration in time range)    ED Course/ Medical Decision Making/ A&P                                 Medical Decision Making Amount and/or Complexity of Data Reviewed Labs: ordered. Radiology: ordered.  Risk Prescription drug management.    This patient presents to the ED for concern of abdominal pain, nausea, vomiting, diarrhea, this involves an extensive number of treatment options, and is a complaint that carries with it a high risk of complications and morbidity.  The differential diagnosis includes gastroenteritis, colitis, small bowel obstruction, appendicitis, cholecystitis, pancreatitis, nephrolithiasis, UTI, pyleonephritis   Co morbidities that complicate the patient evaluation  SBO, afib, CAD, CHF, DM, HTN, stroke    Lab Tests:  I Ordered, and personally interpreted labs.  The pertinent results include: CBC with differential: No concern for anemia or leukocytosis CMP: BUN/Cr elevated at 34/1.39 Lipase: within normal limits    Imaging Studies ordered:  I ordered imaging studies including   -CT Abd/Pelvis: evaluate for  structural/surgical etiology of patients' severe abdominal pain.  I independently visualized and interpreted imaging I agree with the radiologist interpretation   Cardiac Monitoring: / EKG:  The patient was maintained on a cardiac monitor.  I personally viewed and interpreted the cardiac monitored which showed an underlying rhythm of: sinus rhythm    Problem List / ED Course / Critical interventions / Medication management  Patient presented for abdominal pain, nausea, vomiting, and diarrhea. Physical exam with tenderness to palpation, but abdomen is otherwise soft on initial exam. Patient afebrile with stable vitals.  Patient underwent entire premedication course for contrast allergy before CT abd/pelvis with  contrast. Upon arrival to CT, patient's IV blew. Apparently CT asked for someone to come and replace IV but no one was able to. I was not made aware that this had happened. When I called CT to ask for update, they educated me that patient is now outside window for receiving contrast since it has been over an hour since receiving benadryl. Patient has waited too long for imaging so we are forced to proceed with CT abd/pelvis without contrast at this time. Lipase within normal limits. CMP with elevated BUN/Cr at 34/1.39. CMP without electrolyte abnormalities. Hepatic function panel within normal limits. CBC without anemia or leukocytosis. EKG reassuring. Giving patient some IV fluids for increased BUN/Cr. Patient with history of CHF but physical exam is not concerning for fluid overload at this time - no pitting edema or JVD. I have reviewed the patients home medicines and have made adjustments as needed  10PM Care of Katherine [name] transferred to Dr. Silverio Lay at the end of my shift as the patient will require reassessment once labs/imaging have resulted. Patient presentation, ED course, and plan of care discussed with review of all pertinent labs and imaging. Please see his/her note for further  details regarding further ED course and disposition. Plan at time of handoff is reassess patient after CT scan. This may be altered or completely changed at the discretion of the oncoming team pending results of further workup.    Social Determinants of Health:  none          Final Clinical Impression(s) / ED Diagnoses Final diagnoses:  None    Rx / DC Orders ED Discharge Orders     None         Margarita Rana 11/04/22 2209    Charlynne Pander, MD 11/04/22 [phone]

## 2022-11-04 NOTE — ED Notes (Signed)
Pre meds for CT have been given (1845)

## 2022-11-04 NOTE — H&P (Addendum)
History and Physical    Katherine [name]:[id number] DOB: [birth year] DOA: 11/04/2022  PCP: Katherine Cloud, MD   Patient coming from: Home   Chief Complaint:  Chief Complaint  Patient presents with   Abdominal Pain    HPI:  Katherine [name] is a 89 y.o. female with medical history significant of diabetes type 2, hypertension, hyperlipidemia, paroxysmal atrial fibrillation not on anticoagulation, CVA on Plavix, CAD, chronic diastolic heart failure, mild to moderate aortic stenosis, CKD stage IIIA, and chronic dyspnea presented to emergency department via EMS for evaluation for abdominal pain that started on Sunday.  Patient also has some associated nausea, vomiting and diarrhea. Patient is complaining about abdominal pain which has been slightly improved since NG tube has been placed in the ED.  She is able to pass flatus.  And patient had a bowel movement in the afternoon today and 11/04/2022.    ED Course:  At presentation to ED patient is hemodynamically stable. CMP grossly unremarkable except low bicarb 20, elevated blood glucose 255, elevated BUN 34, elevated creatinine 1.39 and low GFR 36. Normal lipase level. CBC grossly unremarkable except chronically low hemoglobin 11.3.  CT abdomen pelvis showed dilated loop of the jejunum in the left hemiabdomen.  A discrete transition point is not identified however obstruction is difficult to exclude.  In the ED NG tube has been placed.  EDP physician consulted general surgery group practice.  I have reached out to on-call general surgery Dr. Derrell Lolling, who will see patient soon.  Review of Systems:  Review of Systems  Constitutional:  Negative for chills, fever and weight loss.  Respiratory:  Negative for cough and shortness of breath.   Cardiovascular:  Negative for chest pain and palpitations.  Gastrointestinal:  Positive for abdominal pain, nausea and vomiting. Negative for blood in stool, constipation, diarrhea and heartburn.   Genitourinary:  Negative for dysuria and urgency.  Musculoskeletal:  Negative for back pain, myalgias and neck pain.  Neurological:  Negative for dizziness and headaches.  Psychiatric/Behavioral:  The patient is not nervous/anxious.     Past Medical History:  Diagnosis Date   Anemia    mild   Anginal pain (HCC)    Aortic stenosis    Atrial fibrillation (HCC)    Bowel obstruction (HCC) 2015   PSBO   Bowel obstruction (HCC) 10/2017   CAD (coronary artery disease)    Carotid artery stenosis    CHF (congestive heart failure) (HCC)    Chronic kidney disease    renal insufficiency - DR Lowell Guitar yearly   Diabetes mellitus without complication (HCC)    type 2   Full dentures    History of hiatal hernia    Hypertension    Mobility impaired    uses walker for ambulation   Stroke (HCC)    right sided weakness,walker   Wears glasses     Past Surgical History:  Procedure Laterality Date   ABDOMINAL HYSTERECTOMY     angiodysplasia     APPENDECTOMY     BREAST DUCTAL SYSTEM EXCISION  03/02/2012   Procedure: EXCISION DUCTAL SYSTEM BREAST;  Surgeon: Ernestene Mention, MD;  Location: [facility];  Service: General;  Laterality: Left;   BREAST EXCISIONAL BIOPSY     BREAST SURGERY     bx rt and lt    CATARACT EXTRACTION     left   CHOLECYSTECTOMY     COLONOSCOPY N/A 10/15/2012   Procedure: COLONOSCOPY;  Surgeon: Theda Belfast, MD;  Location: [facility];  Service: Endoscopy;  Laterality: N/A;   CYSTOSCOPY WITH BIOPSY N/A 01/24/2020   Procedure: CYSTOSCOPY WITH BLADDER BIOPSY AND FULGERATION;  Surgeon: Noel Christmas, MD;  Location: [facility];  Service: Urology;  Laterality: N/A;   DILATION AND CURETTAGE OF UTERUS     DVT     ENTEROSCOPY N/A 11/17/2014   Procedure: ENTEROSCOPY;  Surgeon: Jeani Hawking, MD;  Location: [facility];  Service: Endoscopy;  Laterality: N/A;   ESOPHAGOGASTRODUODENOSCOPY N/A 09/10/2012   Procedure: ESOPHAGOGASTRODUODENOSCOPY (EGD);  Surgeon:  Theda Belfast, MD;  Location: [facility];  Service: Endoscopy;  Laterality: N/A;   EYE SURGERY     right-glaucoma   HOT HEMOSTASIS N/A 09/10/2012   Procedure: HOT HEMOSTASIS (ARGON PLASMA COAGULATION/BICAP);  Surgeon: Theda Belfast, MD;  Location: [facility];  Service: Endoscopy;  Laterality: N/A;   HOT HEMOSTASIS N/A 11/17/2014   Procedure: HOT HEMOSTASIS (ARGON PLASMA COAGULATION/BICAP);  Surgeon: Jeani Hawking, MD;  Location: [facility];  Service: Endoscopy;  Laterality: N/A;   TUBAL LIGATION       reports that she quit smoking about 20 years ago. She has never used smokeless tobacco. She reports that she does not drink alcohol and does not use drugs.  Allergies  Allergen Reactions   Asa [Aspirin] Hives   Blue Dyes (Parenteral) Hives   Motrin [Ibuprofen] Hives   Contrast Media [Iodinated Contrast Media] Other (See Comments)    Unknown reaction   Iohexol Other (See Comments)    Unknown reaction   Shellfish Allergy Hives    Family History  Problem Relation Age of Onset   Heart disease Sister    Heart disease Brother    Heart disease Mother    Heart disease Father    Heart disease Daughter     Prior to Admission medications   Medication Sig Start Date End Date Taking? Authorizing Provider  allopurinol (ZYLOPRIM) 100 MG tablet Take 100 mg by mouth in the morning. 12/31/21   [provider]  atorvastatin (LIPITOR) 40 MG tablet Take 40 mg by mouth in the morning. 02/22/22   [provider]  bimatoprost (LUMIGAN) 0.01 % SOLN Place 1 drop into both eyes in the morning.    [provider]  brimonidine (ALPHAGAN P) 0.1 % SOLN Place 1 drop into both eyes in the morning.    [provider]  clopidogrel (PLAVIX) 75 MG tablet Take 1 tablet (75 mg total) by mouth daily. Patient taking differently: Take 75 mg by mouth in the morning. 06/04/17   Katherine Cloud, MD  dorzolamide (TRUSOPT) 2 % ophthalmic solution Place 1 drop into both eyes in the  morning.    [provider]  ergocalciferol (VITAMIN D2) 50000 units capsule Take 50,000 Units by mouth every Saturday.     [provider]  estradiol (ESTRACE) 0.1 MG/GM vaginal cream Place 1 Applicatorful vaginally daily. 02/28/22   [provider]  Netarsudil-Latanoprost (ROCKLATAN) 0.02-0.005 % SOLN Place 1 drop into both eyes at bedtime.    [provider]  nitroGLYCERIN (NITRODUR - DOSED IN MG/24 HR) 0.4 mg/hr patch Place 0.4 mg onto the skin in the morning.    [provider]  omeprazole (PRILOSEC) 40 MG capsule Take 1 capsule (40 mg total) by mouth daily. Patient taking differently: Take 40 mg by mouth in the morning. 01/25/20   Katherine Cloud, MD     Physical Exam: Vitals:   11/05/22 0130 11/05/22 0200 11/05/22 0216 11/05/22 0258  BP: (!) 154/67 Marland Kitchen)  137/59 (!) 141/58 (!) 160/72  Pulse:   76 73  Resp: 16 17 13 18   Temp:   98.6 F (37 C) 98.8 F (37.1 C)  TempSrc:   Oral Oral  SpO2:   98% 100%  Weight:    72.3 kg  Height:    5\' 7"  (1.702 m)    Physical Exam HENT:     Head: Normocephalic.  Cardiovascular:     Rate and Rhythm: Normal rate.      Labs on Admission: I have personally reviewed following labs and imaging studies  CBC: Recent Labs  Lab 11/04/22 1326 11/05/22 0215  WBC 5.7 4.1  NEUTROABS 4.6  --   HGB 11.3* 10.3*  HCT 36.6 33.1*  MCV 91.7 90.4  PLT 256 241   Basic Metabolic Panel: Recent Labs  Lab 11/04/22 1326 11/05/22 0215  NA 138 140  K 4.1 4.1  CL 103 108  CO2 20* 24  GLUCOSE 255* 226*  BUN 34* 27*  CREATININE 1.39* 1.04*  CALCIUM 9.8 9.0   GFR: Estimated Creatinine Clearance: 35.7 mL/min (A) (by C-G formula based on SCr of 1.04 mg/dL (H)). Liver Function Tests: Recent Labs  Lab 11/04/22 1326 11/05/22 0215  AST 22 19  ALT 14 12  ALKPHOS 88 74  BILITOT 0.6 0.7  PROT 7.7 6.6  ALBUMIN 3.9 3.3*   Recent Labs  Lab 11/04/22 1326  LIPASE 28   No results for input(s): "AMMONIA" in  the last 168 hours. Coagulation Profile: No results for input(s): "INR", "PROTIME" in the last 168 hours. Cardiac Enzymes: No results for input(s): "CKTOTAL", "CKMB", "CKMBINDEX", "TROPONINI", "TROPONINIHS" in the last 168 hours. BNP (last 3 results) No results for input(s): "BNP" in the last 8760 hours. HbA1C: No results for input(s): "HGBA1C" in the last 72 hours. CBG: Recent Labs  Lab 11/05/22 0213  GLUCAP 207*   Lipid Profile: No results for input(s): "CHOL", "HDL", "LDLCALC", "TRIG", "CHOLHDL", "LDLDIRECT" in the last 72 hours. Thyroid Function Tests: No results for input(s): "TSH", "T4TOTAL", "FREET4", "T3FREE", "THYROIDAB" in the last 72 hours. Anemia Panel: No results for input(s): "VITAMINB12", "FOLATE", "FERRITIN", "TIBC", "IRON", "RETICCTPCT" in the last 72 hours. Urine analysis:    Component Value Date/Time   COLORURINE YELLOW 03/17/2022 1228   APPEARANCEUR CLEAR 03/17/2022 1228   LABSPEC 1.015 03/17/2022 1228   PHURINE 5.5 03/17/2022 1228   GLUCOSEU 100 (A) 03/17/2022 1228   HGBUR TRACE (A) 03/17/2022 1228   BILIRUBINUR NEGATIVE 03/17/2022 1228   KETONESUR NEGATIVE 03/17/2022 1228   PROTEINUR 30 (A) 03/17/2022 1228   UROBILINOGEN 1.0 10/15/2013 1104   NITRITE NEGATIVE 03/17/2022 1228   LEUKOCYTESUR SMALL (A) 03/17/2022 1228    Radiological Exams on Admission: I have personally reviewed images DG Chest Portable 1 View  Result Date: 11/04/2022 CLINICAL DATA:  Confirm NG tube placement EXAM: PORTABLE CHEST 1 VIEW COMPARISON:  CT chest 11/18/2018 FINDINGS: Enteric tube tip projects over the left upper quadrant consistent with location in the upper stomach. Heart size and pulmonary vascularity are normal. Linear fibrosis or atelectasis in the lung bases. Peribronchial thickening and central interstitial changes likely due to chronic bronchitis. No focal consolidation. No pleural effusions. No pneumothorax. Calcified and tortuous aorta. Degenerative changes in the spine  and shoulders. IMPRESSION: Enteric tube tip projects over the left upper quadrant consistent with location in the body of the stomach. Chronic bronchitic changes in the lungs with fibrosis or atelectasis in the lung bases. Electronically Signed   By: Marisa Cyphers.D.  On: 11/04/2022 23:50   CT ABDOMEN PELVIS WO CONTRAST  Result Date: 11/04/2022 CLINICAL DATA:  Acute nonlocalized abdominal pain EXAM: CT ABDOMEN AND PELVIS WITHOUT CONTRAST TECHNIQUE: Multidetector CT imaging of the abdomen and pelvis was performed following the standard protocol without IV contrast. RADIATION DOSE REDUCTION: This exam was performed according to the departmental dose-optimization program which includes automated exposure control, adjustment of the mA and/or kV according to patient size and/or use of iterative reconstruction technique. COMPARISON:  CT abdomen and pelvis 03/17/2022 FINDINGS: Lower chest: No acute abnormality. Hepatobiliary: Cholecystectomy. Unremarkable noncontrast appearance of the liver. Prominent bile ducts due to reservoir effect. Pancreas: Unremarkable. Spleen: Unremarkable. Adrenals/Urinary Tract: Normal adrenal glands. No urinary calculi or hydronephrosis. Unremarkable bladder. Stomach/Bowel: There are few dilated loops of small bowel in the left hemiabdomen measuring up to 3.6 cm in diameter. A discrete transition point is not identified. Stomach is within normal limits. Appendix is not definitively visualized. Vascular/Lymphatic: Aortic atherosclerosis. No enlarged abdominal or pelvic lymph nodes. IVC filter. Reproductive: Hysterectomy. Other: No free intraperitoneal air. No free fluid. Coarse calcifications in the bilateral gluteal soft tissues. Musculoskeletal: No acute fracture. IMPRESSION: 1. Dilated loops of jejunum in the left hemiabdomen. A discrete transition point is not identified however obstruction is difficult to exclude. Aortic Atherosclerosis (ICD10-I70.0). Electronically Signed   By:  Minerva Fester M.D.   On: 11/04/2022 23:08    EKG: My personal interpretation of EKG shows: EKG showed normal sinus rhythm heart rate 68, supraventricular bigeminy.  No ST and T wave abnormality.    Assessment/Plan: Principal Problem:   SBO (small bowel obstruction) (HCC) Active Problems:   Acute kidney injury (HCC)   Essential hypertension   Chronic diastolic CHF (congestive heart failure) (HCC)   Paroxysmal atrial fibrillation (HCC)   Controlled type 2 diabetes mellitus without complication, without long-term current use of insulin (HCC)   History of CVA (cerebrovascular accident)   History of CAD (coronary artery disease)   CKD (chronic kidney disease) stage 3, GFR 30-59 ml/min (HCC)   Chronic dyspnea   Chronic anemia   Gout   History of glaucoma    Assessment and Plan: Small bowel obstruction Abdominal pain Nausea and vomiting History of recurrent SBO -Patient presenting with complaining of abdominal pain, nausea and 2-3 episodes of vomiting earlier tonight.  She has cramping abdominal pain.  Had bowel movement earlier today 11/04/2022 and able to pass gas. - CMP unremarkable for any electrolyte derangement. - CT abdomen pelvis showed dilated loop of jejunum in the left hemiabdomen, a discrete transition point is not identified however obstruction is difficult to exclude.  -Physical exam showed abdominal pain with deep palpation.  NG tube has been placed in the ED. - Plan to continue NG tube with intermittent suction and patient n.p.o. - Continue maintenance fluid LR 100 cc/h. - Consulted general surgery Dr. Derrell Lolling for evaluation.  AKI on CKD stage 3A -Lab check showed elevated creatinine 1.39.  Baseline creatinine around 0.7 and and baseline GFR in between 48-53. -Acute kidney injury in setting of vomiting and poor oral intake and rehydration. - Continue gentle hydration with LR 100 cc/h for 1 day - Continue to monitor urine output. - Checking urine sodium and  creatinine level. -Blood pressure is well-controlled.  Holding home losartan and torsemide in the setting of AKI.  Grade 1 diastolic heart failure with preserved EF 75% Essential hypertension -At home patient is on amlodipine 5 mg daily, losartan 50 mg daily and torsemide 20 mg daily. -Continue amlodipine. -  Holding losartan and torsemide in the setting of AKI.   History of paroxysmal atrial fibrillation-not on anticoagulation -EKG showed normal sinus rhythm and ventricular bigeminy.  Heart rate 68. - At home patient is not any anticoagulation now. Rate controlled.  Continue to monitor  Non-insulin-dependent DM type II -Per chart repeat A1c 7.4 checked on 03/17/2022 Currently not on any oral medication.  Managing diabetes with diet control. -As patient is n.p.o. continue to monitor POC blood glucose level every 6 hours and continue hypoglycemia protocol.  History of CAD -Continue aspirin 81 mg daily, Plavix 75 mg daily and Lipitor 40 mg daily  History of CVA -Continue aspirin, Plavix and Lipitor  Anemia of chronic disease -Stable  H&H 11.3 and 36.  Continue to monitor  Gout -As patient is n.p.o. holding allopurinol.  Continuing minimal oral medications.   DVT prophylaxis:  Lovenox Code Status:  Full Code Diet: Currently n.p.o. due to SBO.  Continue minimum oral medications. Family Communication:  none Disposition Plan: Pending clinical improvement of SBO.  Tentative discharge to home next 3 to 4 days Consults: General Surgery Admission status:   Inpatient, Med-Surg  Severity of Illness: The appropriate patient status for this patient is INPATIENT. Inpatient status is judged to be reasonable and necessary in order to provide the required intensity of service to ensure the patient's safety. The patient's presenting symptoms, physical exam findings, and initial radiographic and laboratory data in the context of their chronic comorbidities is felt to place them at high risk for  further clinical deterioration. Furthermore, it is not anticipated that the patient will be medically stable for discharge from the hospital within 2 midnights of admission.   * I certify that at the point of admission it is my clinical judgment that the patient will require inpatient hospital care spanning beyond 2 midnights from the point of admission due to high intensity of service, high risk for further deterioration and high frequency of surveillance required.Marland Kitchen    Tereasa Coop, MD [facility]  How to contact the [facility] Attending or Consulting provider 7A - 7P or covering provider during after hours 7P -7A, for this patient.  Check the care team in [facility] and look for a) attending/consulting [facility] provider listed and b) the [facility] team listed Log into [email or web address] and use [facility]'s universal password to access. If you do not have the password, please contact the hospital operator. Locate the [facility] provider you are looking for under [facility] and page to a number that you can be directly reached. If you still have difficulty reaching the provider, please page the [facility] (Director on Call) for the Hospitalists listed on amion for assistance.  11/05/2022, 5:56 AM

## 2022-11-04 NOTE — ED Triage Notes (Signed)
Patient arrived by [facility] from home with complaint of abdominal pain that started on Sunday. Then developed nausea, vomiting and diarrhea today. Patient alert and oriented, has had SBO in past.  Arrived with no IV

## 2022-11-04 NOTE — ED Provider Triage Note (Signed)
Emergency Medicine Provider Triage Evaluation Note  TENASHA EGLE , a 89 y.o. female  was evaluated in triage.  Pt complains of nausea, vomiting, diarrhea, abdominal pain.  Review of Systems  Positive:  Negative:   Physical Exam  There were no vitals taken for this visit. Gen:   Awake, no distress   Resp:  Normal effort  MSK:   Moves extremities without difficulty  Other:    Medical Decision Making  Medically screening exam initiated at 1:24 PM.  Appropriate orders placed.  TALIANNA KUBIAK was informed that the remainder of the evaluation will be completed by another provider, this initial triage assessment does not replace that evaluation, and the importance of remaining in the ED until their evaluation is complete.  Complaining of central/generalized abdominal pain since Saturday. Nausea, vomiting, diarrhea since late last night.  Endorses hx of SBO treated without surgery last year. Denies hematemesis/hematochezia.  Patient with allergy to contrast dye so I started pre-medication for iodine allergies.   Valrie Hart F, [place] 11/04/22 1332

## 2022-11-04 NOTE — ED Triage Notes (Signed)
Unable to collect labs in triage ?

## 2022-11-05 ENCOUNTER — Inpatient Hospital Stay (HOSPITAL_COMMUNITY): Payer: Medicare HMO

## 2022-11-05 ENCOUNTER — Encounter (HOSPITAL_COMMUNITY): Payer: Self-pay | Admitting: Internal Medicine

## 2022-11-05 DIAGNOSIS — Z7984 Long term (current) use of oral hypoglycemic drugs: Secondary | ICD-10-CM | POA: Diagnosis not present

## 2022-11-05 DIAGNOSIS — I060 Rheumatic aortic stenosis: Secondary | ICD-10-CM | POA: Diagnosis present

## 2022-11-05 DIAGNOSIS — D649 Anemia, unspecified: Secondary | ICD-10-CM | POA: Insufficient documentation

## 2022-11-05 DIAGNOSIS — N179 Acute kidney failure, unspecified: Secondary | ICD-10-CM | POA: Diagnosis present

## 2022-11-05 DIAGNOSIS — Z90710 Acquired absence of both cervix and uterus: Secondary | ICD-10-CM | POA: Diagnosis not present

## 2022-11-05 DIAGNOSIS — Z87891 Personal history of nicotine dependence: Secondary | ICD-10-CM | POA: Diagnosis not present

## 2022-11-05 DIAGNOSIS — Z8673 Personal history of transient ischemic attack (TIA), and cerebral infarction without residual deficits: Secondary | ICD-10-CM | POA: Diagnosis not present

## 2022-11-05 DIAGNOSIS — R109 Unspecified abdominal pain: Secondary | ICD-10-CM | POA: Diagnosis present

## 2022-11-05 DIAGNOSIS — N1831 Chronic kidney disease, stage 3a: Secondary | ICD-10-CM | POA: Diagnosis present

## 2022-11-05 DIAGNOSIS — K56600 Partial intestinal obstruction, unspecified as to cause: Secondary | ICD-10-CM | POA: Diagnosis present

## 2022-11-05 DIAGNOSIS — M109 Gout, unspecified: Secondary | ICD-10-CM | POA: Insufficient documentation

## 2022-11-05 DIAGNOSIS — Z8249 Family history of ischemic heart disease and other diseases of the circulatory system: Secondary | ICD-10-CM | POA: Diagnosis not present

## 2022-11-05 DIAGNOSIS — Z886 Allergy status to analgesic agent status: Secondary | ICD-10-CM | POA: Diagnosis not present

## 2022-11-05 DIAGNOSIS — E1165 Type 2 diabetes mellitus with hyperglycemia: Secondary | ICD-10-CM | POA: Diagnosis present

## 2022-11-05 DIAGNOSIS — Z7902 Long term (current) use of antithrombotics/antiplatelets: Secondary | ICD-10-CM | POA: Diagnosis not present

## 2022-11-05 DIAGNOSIS — E119 Type 2 diabetes mellitus without complications: Secondary | ICD-10-CM

## 2022-11-05 DIAGNOSIS — I5032 Chronic diastolic (congestive) heart failure: Secondary | ICD-10-CM | POA: Diagnosis present

## 2022-11-05 DIAGNOSIS — Z8669 Personal history of other diseases of the nervous system and sense organs: Secondary | ICD-10-CM

## 2022-11-05 DIAGNOSIS — E1122 Type 2 diabetes mellitus with diabetic chronic kidney disease: Secondary | ICD-10-CM | POA: Diagnosis present

## 2022-11-05 DIAGNOSIS — Z79899 Other long term (current) drug therapy: Secondary | ICD-10-CM | POA: Diagnosis not present

## 2022-11-05 DIAGNOSIS — D631 Anemia in chronic kidney disease: Secondary | ICD-10-CM | POA: Diagnosis present

## 2022-11-05 DIAGNOSIS — K56609 Unspecified intestinal obstruction, unspecified as to partial versus complete obstruction: Secondary | ICD-10-CM | POA: Diagnosis not present

## 2022-11-05 DIAGNOSIS — H409 Unspecified glaucoma: Secondary | ICD-10-CM | POA: Diagnosis present

## 2022-11-05 DIAGNOSIS — Z91041 Radiographic dye allergy status: Secondary | ICD-10-CM | POA: Diagnosis not present

## 2022-11-05 DIAGNOSIS — N183 Chronic kidney disease, stage 3 unspecified: Secondary | ICD-10-CM | POA: Insufficient documentation

## 2022-11-05 DIAGNOSIS — E785 Hyperlipidemia, unspecified: Secondary | ICD-10-CM | POA: Diagnosis present

## 2022-11-05 DIAGNOSIS — I130 Hypertensive heart and chronic kidney disease with heart failure and stage 1 through stage 4 chronic kidney disease, or unspecified chronic kidney disease: Secondary | ICD-10-CM | POA: Diagnosis present

## 2022-11-05 DIAGNOSIS — I480 Paroxysmal atrial fibrillation: Secondary | ICD-10-CM | POA: Diagnosis present

## 2022-11-05 DIAGNOSIS — R0609 Other forms of dyspnea: Secondary | ICD-10-CM | POA: Insufficient documentation

## 2022-11-05 DIAGNOSIS — I2510 Atherosclerotic heart disease of native coronary artery without angina pectoris: Secondary | ICD-10-CM | POA: Diagnosis present

## 2022-11-05 DIAGNOSIS — Z7982 Long term (current) use of aspirin: Secondary | ICD-10-CM | POA: Diagnosis not present

## 2022-11-05 DIAGNOSIS — Z8679 Personal history of other diseases of the circulatory system: Secondary | ICD-10-CM

## 2022-11-05 LAB — COMPREHENSIVE METABOLIC PANEL
ALT: 12 U/L (ref 0–44)
AST: 19 U/L (ref 15–41)
Albumin: 3.3 g/dL — ABNORMAL LOW (ref 3.5–5.0)
Alkaline Phosphatase: 74 U/L (ref 38–126)
Anion gap: 8 (ref 5–15)
BUN: 27 mg/dL — ABNORMAL HIGH (ref 8–23)
CO2: 24 mmol/L (ref 22–32)
Calcium: 9 mg/dL (ref 8.9–10.3)
Chloride: 108 mmol/L (ref 98–111)
Creatinine, Ser: 1.04 mg/dL — ABNORMAL HIGH (ref 0.44–1.00)
GFR, Estimated: 51 mL/min — ABNORMAL LOW (ref >60)
Glucose, Bld: 226 mg/dL — ABNORMAL HIGH (ref 70–99)
Potassium: 4.1 mmol/L (ref 3.5–5.1)
Sodium: 140 mmol/L (ref 135–145)
Total Bilirubin: 0.7 mg/dL (ref 0.3–1.2)
Total Protein: 6.6 g/dL (ref 6.5–8.1)

## 2022-11-05 LAB — GLUCOSE, CAPILLARY
Glucose-Capillary: 182 mg/dL — ABNORMAL HIGH (ref 70–99)
Glucose-Capillary: 164 mg/dL — ABNORMAL HIGH (ref 70–99)
Glucose-Capillary: 114 mg/dL — ABNORMAL HIGH (ref 70–99)
Glucose-Capillary: 112 mg/dL — ABNORMAL HIGH (ref 70–99)

## 2022-11-05 LAB — CBC
HCT: 33.1 % — ABNORMAL LOW (ref 36.0–46.0)
Hemoglobin: 10.3 g/dL — ABNORMAL LOW (ref 12.0–15.0)
MCH: 28.1 pg (ref 26.0–34.0)
MCHC: 31.1 g/dL (ref 30.0–36.0)
MCV: 90.4 fL (ref 80.0–100.0)
Platelets: 241 10*3/uL (ref 150–400)
RBC: 3.66 MIL/uL — ABNORMAL LOW (ref 3.87–5.11)
RDW: 14.6 % (ref 11.5–15.5)
WBC: 4.1 10*3/uL (ref 4.0–10.5)
nRBC: 0 % (ref 0.0–0.2)

## 2022-11-05 LAB — CBG MONITORING, ED: Glucose-Capillary: 207 mg/dL — ABNORMAL HIGH (ref 70–99)

## 2022-11-05 MED ORDER — HYDRALAZINE HCL 20 MG/ML IJ SOLN: 10.0000 mg | Freq: Four times a day (QID) | INTRAMUSCULAR | Status: DC | PRN

## 2022-11-05 MED ORDER — MORPHINE SULFATE (PF) 2 MG/ML IV SOLN
1.0000 mg | Freq: Once | INTRAVENOUS | Status: AC
  Administered 2022-11-05: 1 mg via INTRAVENOUS
  Filled 2022-11-05: qty 1

## 2022-11-05 MED ORDER — ATORVASTATIN CALCIUM 40 MG PO TABS
40.0000 mg | ORAL_TABLET | Freq: Every morning | ORAL | Status: DC
  Administered 2022-11-05 – 2022-11-06 (×2): 40 mg via ORAL
  Filled 2022-11-05: qty 1
  Filled 2022-11-05: qty 4

## 2022-11-05 MED ORDER — ALLOPURINOL 100 MG PO TABS: 100.0000 mg | ORAL_TABLET | Freq: Every morning | ORAL | Status: DC

## 2022-11-05 MED ORDER — ONDANSETRON HCL 4 MG/2ML IJ SOLN: 4.0000 mg | Freq: Four times a day (QID) | INTRAMUSCULAR | Status: DC | PRN

## 2022-11-05 MED ORDER — DORZOLAMIDE HCL 2 % OP SOLN
1.0000 [drp] | Freq: Every morning | OPHTHALMIC | Status: DC
  Administered 2022-11-05 – 2022-11-06 (×2): 1 [drp] via OPHTHALMIC
  Filled 2022-11-05: qty 10

## 2022-11-05 MED ORDER — DIATRIZOATE MEGLUMINE & SODIUM 66-10 % PO SOLN
90.0000 mL | Freq: Once | ORAL | Status: AC
  Administered 2022-11-05: 90 mL via NASOGASTRIC
  Filled 2022-11-05: qty 90

## 2022-11-05 MED ORDER — ACETAMINOPHEN 650 MG RE SUPP: 650.0000 mg | Freq: Four times a day (QID) | RECTAL | Status: DC | PRN

## 2022-11-05 MED ORDER — AMLODIPINE BESYLATE 5 MG PO TABS
5.0000 mg | ORAL_TABLET | Freq: Every day | ORAL | Status: DC
  Administered 2022-11-05 – 2022-11-06 (×2): 5 mg via ORAL
  Filled 2022-11-05 (×2): qty 1

## 2022-11-05 MED ORDER — MORPHINE SULFATE (PF) 2 MG/ML IV SOLN
1.0000 mg | INTRAVENOUS | Status: DC | PRN
  Administered 2022-11-05 – 2022-11-06 (×4): 1 mg via INTRAVENOUS
  Filled 2022-11-05 (×4): qty 1

## 2022-11-05 MED ORDER — ACETAMINOPHEN 325 MG PO TABS
650.0000 mg | ORAL_TABLET | Freq: Four times a day (QID) | ORAL | Status: DC | PRN
  Administered 2022-11-06: 650 mg via ORAL
  Filled 2022-11-05: qty 2

## 2022-11-05 MED ORDER — ONDANSETRON HCL 4 MG PO TABS: 4.0000 mg | ORAL_TABLET | Freq: Four times a day (QID) | ORAL | Status: DC | PRN

## 2022-11-05 MED ORDER — NETARSUDIL-LATANOPROST 0.02-0.005 % OP SOLN: 1.0000 [drp] | Freq: Every day | OPHTHALMIC | Status: DC

## 2022-11-05 MED ORDER — ENOXAPARIN SODIUM 30 MG/0.3ML IJ SOSY
30.0000 mg | PREFILLED_SYRINGE | INTRAMUSCULAR | Status: DC
  Administered 2022-11-05 – 2022-11-06 (×2): 30 mg via SUBCUTANEOUS
  Filled 2022-11-05 (×2): qty 0.3

## 2022-11-05 MED ORDER — BRIMONIDINE TARTRATE 0.15 % OP SOLN
1.0000 [drp] | Freq: Every morning | OPHTHALMIC | Status: DC
  Administered 2022-11-05 – 2022-11-06 (×2): 1 [drp] via OPHTHALMIC
  Filled 2022-11-05: qty 5

## 2022-11-05 MED ORDER — SODIUM CHLORIDE 0.9% FLUSH: 3.0000 mL | INTRAVENOUS | Status: DC | PRN

## 2022-11-05 MED ORDER — SODIUM CHLORIDE 0.9 % IV SOLN: 250.0000 mL | INTRAVENOUS | Status: DC | PRN

## 2022-11-05 MED ORDER — LACTATED RINGERS IV SOLN: INTRAVENOUS | Status: DC

## 2022-11-05 MED ORDER — LATANOPROST 0.005 % OP SOLN
1.0000 [drp] | Freq: Every day | OPHTHALMIC | Status: DC
  Administered 2022-11-05: 1 [drp] via OPHTHALMIC
  Filled 2022-11-05: qty 2.5

## 2022-11-05 MED ORDER — SODIUM CHLORIDE 0.9% FLUSH
3.0000 mL | Freq: Two times a day (BID) | INTRAVENOUS | Status: DC
  Administered 2022-11-05 – 2022-11-06 (×4): 3 mL via INTRAVENOUS

## 2022-11-05 MED ORDER — DEXTROSE 50 % IV SOLN: 1.0000 | INTRAVENOUS | Status: DC | PRN

## 2022-11-05 MED ORDER — CLOPIDOGREL BISULFATE 75 MG PO TABS
75.0000 mg | ORAL_TABLET | Freq: Every day | ORAL | Status: DC
  Administered 2022-11-05: 75 mg via ORAL
  Filled 2022-11-05: qty 1

## 2022-11-05 NOTE — ED Notes (Signed)
ED TO INPATIENT HANDOFF REPORT  ED Nurse Name and Phone #: Pearletha Forge RN [phone]  S Name/Age/Gender Katherine [name] 89 y.o. female Room/Bed: 001C/001C  Code Status   Code Status: Full Code  Home/SNF/Other Home Patient oriented to: self, place, time, and situation Is this baseline? Yes   Triage Complete: Triage complete  Chief Complaint SBO (small bowel obstruction) (HCC) [K56.609]  Triage Note Patient arrived by [facility] from home with complaint of abdominal pain that started on Sunday. Then developed nausea, vomiting and diarrhea today. Patient alert and oriented, has had SBO in past.  Arrived with no IV  Unable to collect labs in triage    Allergies Allergies  Allergen Reactions   Asa [Aspirin] Hives   Blue Dyes (Parenteral) Hives   Motrin [Ibuprofen] Hives   Contrast Media [Iodinated Contrast Media] Other (See Comments)    Unknown reaction   Iohexol Other (See Comments)    Unknown reaction   Shellfish Allergy Hives    Level of Care/Admitting Diagnosis ED Disposition     ED Disposition  Admit   Condition  --   Comment  Hospital Area: [facility] [[id number]]  Level of Care: Med-Surg [16]  May admit patient to [facility] or [facility] if equivalent level of care is available:: No  Covid Evaluation: Asymptomatic - no recent exposure (last 10 days) testing not required  Diagnosis: SBO (small bowel obstruction) [facility]) [[id number]]  Admitting Physician: Tereasa Coop [[id number]]  Attending Physician: Tereasa Coop [[id number]]  Certification:: I certify this patient will need inpatient services for at least 2 midnights  Expected Medical Readiness: 11/08/2022          B Medical/Surgery History Past Medical History:  Diagnosis Date   Anemia    mild   Anginal pain (HCC)    Aortic stenosis    Atrial fibrillation (HCC)    Bowel obstruction (HCC) 2015   PSBO   Bowel obstruction (HCC) 10/2017   CAD (coronary artery disease)    Carotid artery stenosis     CHF (congestive heart failure) (HCC)    Chronic kidney disease    renal insufficiency - DR Lowell Guitar yearly   Diabetes mellitus without complication (HCC)    type 2   Full dentures    History of hiatal hernia    Hypertension    Mobility impaired    uses Alexio Sroka for ambulation   Stroke (HCC)    right sided weakness,Cashe Gatt   Wears glasses    Past Surgical History:  Procedure Laterality Date   ABDOMINAL HYSTERECTOMY     angiodysplasia     APPENDECTOMY     BREAST DUCTAL SYSTEM EXCISION  03/02/2012   Procedure: EXCISION DUCTAL SYSTEM BREAST;  Surgeon: Ernestene Mention, MD;  Location: [facility];  Service: General;  Laterality: Left;   BREAST EXCISIONAL BIOPSY     BREAST SURGERY     bx rt and lt    CATARACT EXTRACTION     left   CHOLECYSTECTOMY     COLONOSCOPY N/A 10/15/2012   Procedure: COLONOSCOPY;  Surgeon: Theda Belfast, MD;  Location: [facility];  Service: Endoscopy;  Laterality: N/A;   CYSTOSCOPY WITH BIOPSY N/A 01/24/2020   Procedure: CYSTOSCOPY WITH BLADDER BIOPSY AND FULGERATION;  Surgeon: Noel Christmas, MD;  Location: [facility];  Service: Urology;  Laterality: N/A;   DILATION AND CURETTAGE OF UTERUS     DVT     ENTEROSCOPY N/A 11/17/2014   Procedure: ENTEROSCOPY;  Surgeon: Jeani Hawking,  MD;  Location: [facility];  Service: Endoscopy;  Laterality: N/A;   ESOPHAGOGASTRODUODENOSCOPY N/A 09/10/2012   Procedure: ESOPHAGOGASTRODUODENOSCOPY (EGD);  Surgeon: Theda Belfast, MD;  Location: [facility];  Service: Endoscopy;  Laterality: N/A;   EYE SURGERY     right-glaucoma   HOT HEMOSTASIS N/A 09/10/2012   Procedure: HOT HEMOSTASIS (ARGON PLASMA COAGULATION/BICAP);  Surgeon: Theda Belfast, MD;  Location: [facility];  Service: Endoscopy;  Laterality: N/A;   HOT HEMOSTASIS N/A 11/17/2014   Procedure: HOT HEMOSTASIS (ARGON PLASMA COAGULATION/BICAP);  Surgeon: Jeani Hawking, MD;  Location: [facility];  Service: Endoscopy;  Laterality: N/A;   TUBAL LIGATION        A IV Location/Drains/Wounds Patient Lines/Drains/Airways Status     Active Line/Drains/Airways     Name Placement date Placement time Site Days   Peripheral IV 11/04/22 18 G Right Antecubital 11/04/22  1545  Antecubital  1   Peripheral IV 11/04/22 Left;Posterior;Proximal Forearm 11/04/22  2051  Forearm  1   NG/OG Vented/Dual Lumen 16 Fr. Left nare Marking at nare/corner of mouth 11/04/22  2324  Left nare  1   External Urinary Catheter 03/18/22  1900  --  232   Wound / Incision (Open or Dehisced) 03/18/22 Skin tear Buttocks Left 03/18/22  1519  Buttocks  232            Intake/Output Last 24 hours No intake or output data in the 24 hours ending 11/05/22 0157  Labs/Imaging Results for orders placed or performed during the hospital encounter of 11/04/22 (from the past 48 hour(s))  Comprehensive metabolic panel     Status: Abnormal   Collection Time: 11/04/22  1:26 PM  Result Value Ref Range   Sodium 138 135 - 145 mmol/L   Potassium 4.1 3.5 - 5.1 mmol/L   Chloride 103 98 - 111 mmol/L   CO2 20 (L) 22 - 32 mmol/L   Glucose, Bld 255 (H) 70 - 99 mg/dL    Comment: Glucose reference range applies only to samples taken after fasting for at least 8 hours.   BUN 34 (H) 8 - 23 mg/dL   Creatinine, Ser 4.01 (H) 0.44 - 1.00 mg/dL   Calcium 9.8 8.9 - 02.7 mg/dL   Total Protein 7.7 6.5 - 8.1 g/dL   Albumin 3.9 3.5 - 5.0 g/dL   AST 22 15 - 41 U/L   ALT 14 0 - 44 U/L   Alkaline Phosphatase 88 38 - 126 U/L   Total Bilirubin 0.6 0.3 - 1.2 mg/dL   GFR, Estimated 36 (L) >60 mL/min    Comment: (NOTE) Calculated using the CKD-EPI Creatinine Equation (2021)    Anion gap 15 5 - 15    Comment: Performed at [facility], [address], [place], [place] [address]  Lipase, blood     Status: None   Collection Time: 11/04/22  1:26 PM  Result Value Ref Range   Lipase 28 11 - 51 U/L    Comment: Performed at [facility], [address] [place], [place] [address]  CBC with Diff      Status: Abnormal   Collection Time: 11/04/22  1:26 PM  Result Value Ref Range   WBC 5.7 4.0 - 10.5 K/uL   RBC 3.99 3.87 - 5.11 MIL/uL   Hemoglobin 11.3 (L) 12.0 - 15.0 g/dL   HCT 74.2 59.5 - 63.8 %   MCV 91.7 80.0 - 100.0 fL   MCH 28.3 26.0 - 34.0 pg  MCHC 30.9 30.0 - 36.0 g/dL   RDW 25.9 56.3 - 87.5 %   Platelets 256 150 - 400 K/uL   nRBC 0.0 0.0 - 0.2 %   Neutrophils Relative % 80 %   Neutro Abs 4.6 1.7 - 7.7 K/uL   Lymphocytes Relative 14 %   Lymphs Abs 0.8 0.7 - 4.0 K/uL   Monocytes Relative 6 %   Monocytes Absolute 0.3 0.1 - 1.0 K/uL   Eosinophils Relative 0 %   Eosinophils Absolute 0.0 0.0 - 0.5 K/uL   Basophils Relative 0 %   Basophils Absolute 0.0 0.0 - 0.1 K/uL   Immature Granulocytes 0 %   Abs Immature Granulocytes 0.01 0.00 - 0.07 K/uL    Comment: Performed at [facility], [address] [place], [place] [address]   DG Chest Portable 1 View  Result Date: 11/04/2022 CLINICAL DATA:  Confirm NG tube placement EXAM: PORTABLE CHEST 1 VIEW COMPARISON:  CT chest 11/18/2018 FINDINGS: Enteric tube tip projects over the left upper quadrant consistent with location in the upper stomach. Heart size and pulmonary vascularity are normal. Linear fibrosis or atelectasis in the lung bases. Peribronchial thickening and central interstitial changes likely due to chronic bronchitis. No focal consolidation. No pleural effusions. No pneumothorax. Calcified and tortuous aorta. Degenerative changes in the spine and shoulders. IMPRESSION: Enteric tube tip projects over the left upper quadrant consistent with location in the body of the stomach. Chronic bronchitic changes in the lungs with fibrosis or atelectasis in the lung bases. Electronically Signed   By: Burman Nieves M.D.   On: 11/04/2022 23:50   CT ABDOMEN PELVIS WO CONTRAST  Result Date: 11/04/2022 CLINICAL DATA:  Acute nonlocalized abdominal pain EXAM: CT ABDOMEN AND PELVIS WITHOUT CONTRAST TECHNIQUE: Multidetector CT imaging of  the abdomen and pelvis was performed following the standard protocol without IV contrast. RADIATION DOSE REDUCTION: This exam was performed according to the departmental dose-optimization program which includes automated exposure control, adjustment of the mA and/or kV according to patient size and/or use of iterative reconstruction technique. COMPARISON:  CT abdomen and pelvis 03/17/2022 FINDINGS: Lower chest: No acute abnormality. Hepatobiliary: Cholecystectomy. Unremarkable noncontrast appearance of the liver. Prominent bile ducts due to reservoir effect. Pancreas: Unremarkable. Spleen: Unremarkable. Adrenals/Urinary Tract: Normal adrenal glands. No urinary calculi or hydronephrosis. Unremarkable bladder. Stomach/Bowel: There are few dilated loops of small bowel in the left hemiabdomen measuring up to 3.6 cm in diameter. A discrete transition point is not identified. Stomach is within normal limits. Appendix is not definitively visualized. Vascular/Lymphatic: Aortic atherosclerosis. No enlarged abdominal or pelvic lymph nodes. IVC filter. Reproductive: Hysterectomy. Other: No free intraperitoneal air. No free fluid. Coarse calcifications in the bilateral gluteal soft tissues. Musculoskeletal: No acute fracture. IMPRESSION: 1. Dilated loops of jejunum in the left hemiabdomen. A discrete transition point is not identified however obstruction is difficult to exclude. Aortic Atherosclerosis (ICD10-I70.0). Electronically Signed   By: Minerva Fester M.D.   On: 11/04/2022 23:08    Pending Labs Unresulted Labs (From admission, onward)     Start     Ordered   11/05/22 0500  Comprehensive metabolic panel  Tomorrow morning,   R        11/05/22 0134   11/05/22 0500  CBC  Tomorrow morning,   R        11/05/22 0134   11/05/22 0150  Sodium, urine, random  Once,   R        11/05/22 0149   11/05/22 0150  Creatinine, urine, random  Once,   R        11/05/22 0149            Vitals/Pain Today's Vitals    11/04/22 2315 11/04/22 2330 11/04/22 2345 11/05/22 0000  BP: (!) 142/112 (!) 154/96 (!) 159/70 (!) 150/67  Pulse:      Resp: 11 13 11 15   Temp:      TempSrc:      SpO2:      PainSc:        Isolation Precautions No active isolations  Medications Medications  ondansetron (ZOFRAN) injection 4 mg (4 mg Intravenous Given 11/04/22 2244)  lactated ringers infusion (has no administration in time range)  clopidogrel (PLAVIX) tablet 75 mg (has no administration in time range)  amLODipine (NORVASC) tablet 5 mg (has no administration in time range)  atorvastatin (LIPITOR) tablet 40 mg (has no administration in time range)  brimonidine (ALPHAGAN) 0.15 % ophthalmic solution 1 drop (has no administration in time range)  dorzolamide (TRUSOPT) 2 % ophthalmic solution 1 drop (has no administration in time range)  Netarsudil-Latanoprost 0.02-0.005 % SOLN 1 drop (has no administration in time range)  enoxaparin (LOVENOX) injection 30 mg (has no administration in time range)  sodium chloride flush (NS) 0.9 % injection 3 mL (has no administration in time range)  sodium chloride flush (NS) 0.9 % injection 3 mL (has no administration in time range)  0.9 %  sodium chloride infusion (has no administration in time range)  acetaminophen (TYLENOL) tablet 650 mg (has no administration in time range)    Or  acetaminophen (TYLENOL) suppository 650 mg (has no administration in time range)  ondansetron (ZOFRAN) tablet 4 mg (has no administration in time range)    Or  ondansetron (ZOFRAN) injection 4 mg (has no administration in time range)  hydrALAZINE (APRESOLINE) injection 10 mg (has no administration in time range)  dextrose 50 % solution 50 mL (has no administration in time range)  diphenhydrAMINE (BENADRYL) capsule 50 mg ( Oral See Alternative 11/04/22 1845)    Or  diphenhydrAMINE (BENADRYL) injection 50 mg (50 mg Intravenous Given 11/04/22 1845)  methylPREDNISolone sodium succinate (SOLU-MEDROL) 40 mg/mL  injection 40 mg (40 mg Intravenous Given 11/04/22 1546)  sodium chloride 0.9 % bolus 500 mL (0 mLs Intravenous Stopped 11/04/22 2323)    Mobility walks with device     Focused Assessments Cardiac Assessment Handoff:  Cardiac Rhythm: Normal sinus rhythm Lab Results  Component Value Date   CKTOTAL 112 05/29/2010   CKMB 2.1 05/29/2010   TROPONINI <0.03 10/31/2017   Lab Results  Component Value Date   DDIMER (H) 09/13/2007    2.65        AT THE INHOUSE ESTABLISHED CUTOFF VALUE OF 0.48 ug/mL FEU, THIS ASSAY HAS BEEN DOCUMENTED IN THE LITERATURE TO HAVE   Does the Patient currently have chest pain? No    R Recommendations: See Admitting Provider Note  Report given to:   Additional Notes:

## 2022-11-05 NOTE — Consult Note (Addendum)
reason for Consult: SBO Referring Physician: Dr. Reynolds Bowl Katherine [name] is an 89 y.o. female.  HPI: Patient is an 89 year old female, with history of CAD, CHF, chronic kidney disease, diabetes, hypertension, history of stroke, A-fib-on Plavix .  She comes in secondary to SBO.  Patient states that the SBO has been going on since Sunday.  States she has been having nausea and vomiting.  She states she has had some diarrhea.  She does state that she has had multiple previous bowel surgeries secondary to bowel obstructions in the past.  She is unsure how long ago these were.  She does have what appears to be lower midline incisions with possible hernia on exam.  Upon evaluation in the ER she underwent CT scan.  This was significant for signs consistent with SBO.  The small bowel is not very dilated.  There is no transition point seen.  General surgery was consulted for further evaluation and management.  Past Medical History:  Diagnosis Date   Anemia    mild   Anginal pain (HCC)    Aortic stenosis    Atrial fibrillation (HCC)    Bowel obstruction (HCC) 2015   PSBO   Bowel obstruction (HCC) 10/2017   CAD (coronary artery disease)    Carotid artery stenosis    CHF (congestive heart failure) (HCC)    Chronic kidney disease    renal insufficiency - DR Lowell Guitar yearly   Diabetes mellitus without complication (HCC)    type 2   Full dentures    History of hiatal hernia    Hypertension    Mobility impaired    uses walker for ambulation   Stroke (HCC)    right sided weakness,walker   Wears glasses     Past Surgical History:  Procedure Laterality Date   ABDOMINAL HYSTERECTOMY     angiodysplasia     APPENDECTOMY     BREAST DUCTAL SYSTEM EXCISION  03/02/2012   Procedure: EXCISION DUCTAL SYSTEM BREAST;  Surgeon: Ernestene Mention, MD;  Location: [facility];  Service: General;  Laterality: Left;   BREAST EXCISIONAL BIOPSY     BREAST SURGERY     bx rt and lt    CATARACT  EXTRACTION     left   CHOLECYSTECTOMY     COLONOSCOPY N/A 10/15/2012   Procedure: COLONOSCOPY;  Surgeon: Theda Belfast, MD;  Location: [facility];  Service: Endoscopy;  Laterality: N/A;   CYSTOSCOPY WITH BIOPSY N/A 01/24/2020   Procedure: CYSTOSCOPY WITH BLADDER BIOPSY AND FULGERATION;  Surgeon: Noel Christmas, MD;  Location: [facility];  Service: Urology;  Laterality: N/A;   DILATION AND CURETTAGE OF UTERUS     DVT     ENTEROSCOPY N/A 11/17/2014   Procedure: ENTEROSCOPY;  Surgeon: Jeani Hawking, MD;  Location: [facility];  Service: Endoscopy;  Laterality: N/A;   ESOPHAGOGASTRODUODENOSCOPY N/A 09/10/2012   Procedure: ESOPHAGOGASTRODUODENOSCOPY (EGD);  Surgeon: Theda Belfast, MD;  Location: [facility];  Service: Endoscopy;  Laterality: N/A;   EYE SURGERY     right-glaucoma   HOT HEMOSTASIS N/A 09/10/2012   Procedure: HOT HEMOSTASIS (ARGON PLASMA COAGULATION/BICAP);  Surgeon: Theda Belfast, MD;  Location: [facility];  Service: Endoscopy;  Laterality: N/A;   HOT HEMOSTASIS N/A 11/17/2014   Procedure: HOT HEMOSTASIS (ARGON PLASMA COAGULATION/BICAP);  Surgeon: Jeani Hawking, MD;  Location: [facility];  Service: Endoscopy;  Laterality: N/A;   TUBAL LIGATION      Family History  Problem Relation Age of Onset  Heart disease Sister    Heart disease Brother    Heart disease Mother    Heart disease Father    Heart disease Daughter     Social History:  reports that she quit smoking about 20 years ago. She has never used smokeless tobacco. She reports that she does not drink alcohol and does not use drugs.  Allergies:  Allergies  Allergen Reactions   Asa [Aspirin] Hives   Blue Dyes (Parenteral) Hives   Motrin [Ibuprofen] Hives   Contrast Media [Iodinated Contrast Media] Other (See Comments)    Unknown reaction   Iohexol Other (See Comments)    Unknown reaction   Shellfish Allergy Hives    Medications: I have reviewed the patient's current medications.  Results for orders  placed or performed during the hospital encounter of 11/04/22 (from the past 48 hour(s))  Comprehensive metabolic panel     Status: Abnormal   Collection Time: 11/04/22  1:26 PM  Result Value Ref Range   Sodium 138 135 - 145 mmol/L   Potassium 4.1 3.5 - 5.1 mmol/L   Chloride 103 98 - 111 mmol/L   CO2 20 (L) 22 - 32 mmol/L   Glucose, Bld 255 (H) 70 - 99 mg/dL    Comment: Glucose reference range applies only to samples taken after fasting for at least 8 hours.   BUN 34 (H) 8 - 23 mg/dL   Creatinine, Ser 1.61 (H) 0.44 - 1.00 mg/dL   Calcium 9.8 8.9 - 09.6 mg/dL   Total Protein 7.7 6.5 - 8.1 g/dL   Albumin 3.9 3.5 - 5.0 g/dL   AST 22 15 - 41 U/L   ALT 14 0 - 44 U/L   Alkaline Phosphatase 88 38 - 126 U/L   Total Bilirubin 0.6 0.3 - 1.2 mg/dL   GFR, Estimated 36 (L) >60 mL/min    Comment: (NOTE) Calculated using the CKD-EPI Creatinine Equation (2021)    Anion gap 15 5 - 15    Comment: Performed at [facility], [address], [place], [place] [address]  Lipase, blood     Status: None   Collection Time: 11/04/22  1:26 PM  Result Value Ref Range   Lipase 28 11 - 51 U/L    Comment: Performed at [facility], [address], [place], [place] [address]  CBC with Diff     Status: Abnormal   Collection Time: 11/04/22  1:26 PM  Result Value Ref Range   WBC 5.7 4.0 - 10.5 K/uL   RBC 3.99 3.87 - 5.11 MIL/uL   Hemoglobin 11.3 (L) 12.0 - 15.0 g/dL   HCT 14.7 82.9 - 56.2 %   MCV 91.7 80.0 - 100.0 fL   MCH 28.3 26.0 - 34.0 pg   MCHC 30.9 30.0 - 36.0 g/dL   RDW 13.0 86.5 - 78.4 %   Platelets 256 150 - 400 K/uL   nRBC 0.0 0.0 - 0.2 %   Neutrophils Relative % 80 %   Neutro Abs 4.6 1.7 - 7.7 K/uL   Lymphocytes Relative 14 %   Lymphs Abs 0.8 0.7 - 4.0 K/uL   Monocytes Relative 6 %   Monocytes Absolute 0.3 0.1 - 1.0 K/uL   Eosinophils Relative 0 %   Eosinophils Absolute 0.0 0.0 - 0.5 K/uL   Basophils Relative 0 %   Basophils Absolute 0.0 0.0 - 0.1 K/uL   Immature Granulocytes  0 %   Abs Immature Granulocytes 0.01 0.00 - 0.07 K/uL  Comment: Performed at [facility], [address], [place], [place] [address]  CBG monitoring, ED     Status: Abnormal   Collection Time: 11/05/22  2:13 AM  Result Value Ref Range   Glucose-Capillary 207 (H) 70 - 99 mg/dL    Comment: Glucose reference range applies only to samples taken after fasting for at least 8 hours.   Comment 1 Document in Chart   Comprehensive metabolic panel     Status: Abnormal   Collection Time: 11/05/22  2:15 AM  Result Value Ref Range   Sodium 140 135 - 145 mmol/L   Potassium 4.1 3.5 - 5.1 mmol/L   Chloride 108 98 - 111 mmol/L   CO2 24 22 - 32 mmol/L   Glucose, Bld 226 (H) 70 - 99 mg/dL    Comment: Glucose reference range applies only to samples taken after fasting for at least 8 hours.   BUN 27 (H) 8 - 23 mg/dL   Creatinine, Ser 6.04 (H) 0.44 - 1.00 mg/dL   Calcium 9.0 8.9 - 54.0 mg/dL   Total Protein 6.6 6.5 - 8.1 g/dL   Albumin 3.3 (L) 3.5 - 5.0 g/dL   AST 19 15 - 41 U/L   ALT 12 0 - 44 U/L   Alkaline Phosphatase 74 38 - 126 U/L   Total Bilirubin 0.7 0.3 - 1.2 mg/dL   GFR, Estimated 51 (L) >60 mL/min    Comment: (NOTE) Calculated using the CKD-EPI Creatinine Equation (2021)    Anion gap 8 5 - 15    Comment: Performed at [facility], [address] [place], [place] [address]  CBC     Status: Abnormal   Collection Time: 11/05/22  2:15 AM  Result Value Ref Range   WBC 4.1 4.0 - 10.5 K/uL   RBC 3.66 (L) 3.87 - 5.11 MIL/uL   Hemoglobin 10.3 (L) 12.0 - 15.0 g/dL   HCT 14.7 (L) 82.9 - 56.2 %   MCV 90.4 80.0 - 100.0 fL   MCH 28.1 26.0 - 34.0 pg   MCHC 31.1 30.0 - 36.0 g/dL   RDW 13.0 86.5 - 78.4 %   Platelets 241 150 - 400 K/uL   nRBC 0.0 0.0 - 0.2 %    Comment: Performed at [facility], [address], [place], [place] [address]  Glucose, capillary     Status: Abnormal   Collection Time: 11/05/22  6:00 AM  Result Value Ref Range   Glucose-Capillary 182 (H) 70 - 99  mg/dL    Comment: Glucose reference range applies only to samples taken after fasting for at least 8 hours.    DG Chest Portable 1 View  Result Date: 11/04/2022 CLINICAL DATA:  Confirm NG tube placement EXAM: PORTABLE CHEST 1 VIEW COMPARISON:  CT chest 11/18/2018 FINDINGS: Enteric tube tip projects over the left upper quadrant consistent with location in the upper stomach. Heart size and pulmonary vascularity are normal. Linear fibrosis or atelectasis in the lung bases. Peribronchial thickening and central interstitial changes likely due to chronic bronchitis. No focal consolidation. No pleural effusions. No pneumothorax. Calcified and tortuous aorta. Degenerative changes in the spine and shoulders. IMPRESSION: Enteric tube tip projects over the left upper quadrant consistent with location in the body of the stomach. Chronic bronchitic changes in the lungs with fibrosis or atelectasis in the lung bases. Electronically Signed   By: Burman Nieves M.D.   On: 11/04/2022 23:50   CT ABDOMEN PELVIS WO CONTRAST  Result Date: 11/04/2022 CLINICAL DATA:  Acute nonlocalized abdominal pain EXAM: CT ABDOMEN AND PELVIS WITHOUT CONTRAST TECHNIQUE: Multidetector CT imaging of the abdomen and pelvis was performed following the standard protocol without IV contrast. RADIATION DOSE REDUCTION: This exam was performed according to the departmental dose-optimization program which includes automated exposure control, adjustment of the mA and/or kV according to patient size and/or use of iterative reconstruction technique. COMPARISON:  CT abdomen and pelvis 03/17/2022 FINDINGS: Lower chest: No acute abnormality. Hepatobiliary: Cholecystectomy. Unremarkable noncontrast appearance of the liver. Prominent bile ducts due to reservoir effect. Pancreas: Unremarkable. Spleen: Unremarkable. Adrenals/Urinary Tract: Normal adrenal glands. No urinary calculi or hydronephrosis. Unremarkable bladder. Stomach/Bowel: There are few dilated loops  of small bowel in the left hemiabdomen measuring up to 3.6 cm in diameter. A discrete transition point is not identified. Stomach is within normal limits. Appendix is not definitively visualized. Vascular/Lymphatic: Aortic atherosclerosis. No enlarged abdominal or pelvic lymph nodes. IVC filter. Reproductive: Hysterectomy. Other: No free intraperitoneal air. No free fluid. Coarse calcifications in the bilateral gluteal soft tissues. Musculoskeletal: No acute fracture. IMPRESSION: 1. Dilated loops of jejunum in the left hemiabdomen. A discrete transition point is not identified however obstruction is difficult to exclude. Aortic Atherosclerosis (ICD10-I70.0). Electronically Signed   By: Minerva Fester M.D.   On: 11/04/2022 23:08    Review of Systems  Constitutional:  Negative for chills and fever.  HENT:  Negative for ear discharge, hearing loss and sore throat.   Eyes:  Negative for discharge.  Respiratory:  Negative for cough and shortness of breath.   Cardiovascular:  Negative for chest pain and leg swelling.  Gastrointestinal:  Positive for abdominal distention, abdominal pain, diarrhea and nausea. Negative for constipation and vomiting.  Musculoskeletal:  Negative for myalgias and neck pain.  Skin:  Negative for rash.  Allergic/Immunologic: Negative for environmental allergies.  Neurological:  Negative for dizziness and seizures.  Hematological:  Does not bruise/bleed easily.  Psychiatric/Behavioral:  Negative for suicidal ideas.   All other systems reviewed and are negative.  Blood pressure (!) 160/72, pulse 73, temperature 98.8 F (37.1 C), temperature source Oral, resp. rate 18, height 5\' 7"  (1.702 m), weight 72.3 kg, SpO2 100%. Physical Exam Constitutional:      Appearance: She is well-developed.     Comments: Conversant No acute distress  HENT:     Head: Normocephalic and atraumatic.  Eyes:     General: Lids are normal. No scleral icterus.    Pupils: Pupils are equal, round, and  reactive to light.     Comments: Pupils are equal round and reactive No lid lag Moist conjunctiva  Neck:     Thyroid: No thyromegaly.     Trachea: No tracheal tenderness.     Comments: No cervical lymphadenopathy Cardiovascular:     Rate and Rhythm: Normal rate and regular rhythm.     Heart sounds: No murmur heard. Pulmonary:     Effort: Pulmonary effort is normal.     Breath sounds: Normal breath sounds. No wheezing or rales.  Abdominal:     Tenderness: There is no abdominal tenderness.     Hernia: No hernia is present.    Musculoskeletal:     Cervical back: Normal range of motion and neck supple.  Skin:    General: Skin is warm.     Findings: No rash.     Nails: There is no clubbing.     Comments: Normal skin turgor  Neurological:     Mental Status: She is  alert and oriented to person, place, and time.     Comments: Normal gait and station  Psychiatric:        Mood and Affect: Mood normal.        Thought Content: Thought content normal.        Judgment: Judgment normal.     Comments: Appropriate affect     Assessment/Plan: 89 year old female with SBO CHF Hypertension A-fib Diabetes History of CVA Chronic kidney disease  1.  At this point agree with continuing NG tube.  Will discuss with radiology whether or not she can have Gastrografin as patient is allergic to IV contrast. 2.  Continue to hold Plavix 3.  Discussed with the patient that if things improve with NG tube she may be able to avoid surgery.  If things are not improving with NG tube she may require laparotomy.   Will follow along.   Axel Filler 11/05/2022, 6:34 AM

## 2022-11-05 NOTE — Progress Notes (Signed)
   11/05/22 1700  Mobility  Activity Refused mobility  Mobility Specialist Start Time (ACUTE ONLY) 1545  Mobility Specialist Stop Time (ACUTE ONLY) 1547  Mobility Specialist Time Calculation (min) (ACUTE ONLY) 2 min   Mobility Specialist: Progress Note  Pt denied mobility session - received in bed.  Pt was adamant in not mobilizing today, denying all activity options offered by MS. Pt had a BM - NT notified. Pt left in bed with all needs met - call bell within reach.   Barnie Mort, BS Mobility Specialist Please contact via SecureChat or Rehab office at [phone].

## 2022-11-05 NOTE — Progress Notes (Signed)
Checked on patient who was just seen earlier this morning.  NGT suction on too high.  This was turned down and NGT flushed.  Minimal output right now.  Has some abdominal tenderness, but no guarding or peritonitis.  Will start the SBO protocol and continue conservative management.  Discussed with the patient and primary service.  Letha Cape 9:13 AM 11/05/2022

## 2022-11-05 NOTE — Inpatient Diabetes Management (Signed)
Inpatient Diabetes Program Recommendations  AACE/ADA: New Consensus Statement on Inpatient Glycemic Control (2015)  Target Ranges:  Prepandial:   less than 140 mg/dL      Peak postprandial:   less than 180 mg/dL (1-2 hours)      Critically ill patients:  140 - 180 mg/dL   Lab Results  Component Value Date   GLUCAP 182 (H) 11/05/2022   HGBA1C 7.4 (H) 03/17/2022    Review of Glycemic Control  Diabetes history: DM 2 Outpatient Diabetes medications: no meds listed at home Current orders for Inpatient glycemic control:  None  Note: Solumedrol 40 mg given yesterday at 1546, glucose prior to administration was 255 mg/dl.   Inpatient Diabetes Program Recommendations:    -   order CBGs and Novolog 0-6 units tid + hs  Thanks,  Christena Deem RN, MSN, BC-ADM Inpatient Diabetes Coordinator Team Pager [phone] (8a-5p)

## 2022-11-05 NOTE — Progress Notes (Signed)
PROGRESS NOTE    Katherine [name]  WUJ:[id number] DOB: 1930/12/25 DOA: 11/04/2022 PCP: Rinaldo Cloud, MD    Brief Narrative:  Katherine [name] is a 89 y.o. female with medical history significant of diabetes type 2, hypertension, hyperlipidemia, paroxysmal atrial fibrillation not on anticoagulation, CVA on Plavix, CAD, chronic diastolic heart failure, mild to moderate aortic stenosis, CKD stage IIIA, and chronic dyspnea presented to emergency department via EMS for evaluation for abdominal pain that started on Sunday.     Assessment and Plan: Small bowel obstruction Abdominal pain Nausea and vomiting History of recurrent SBO -Patient presenting with complaining of abdominal pain, nausea and 2-3 episodes of vomiting earlier tonight.  She has cramping abdominal pain.  Had bowel movement earlier today 11/04/2022 and able to pass gas. - CMP unremarkable for any electrolyte derangement. - CT abdomen pelvis showed dilated loop of jejunum in the left hemiabdomen, a discrete transition point is not identified however obstruction is difficult to exclude.  -NG tube in place - Consulted general surgery-- plan for SBO obstruction   AKI on CKD stage 3A -Lab check showed elevated creatinine 1.39.  Baseline creatinine around 0.7 and and baseline GFR in between 48-53. -Acute kidney injury in setting of vomiting and poor oral intake and rehydration. - Holding home losartan and torsemide in the setting of AKI.   Grade 1 diastolic heart failure with preserved EF 75% Essential hypertension - Holding losartan and torsemide in the setting of AKI.     History of paroxysmal atrial fibrillation-not on anticoagulation -EKG showed normal sinus rhythm and ventricular bigeminy.  Heart rate 68. Rate controlled.  Continue to monitor   Non-insulin-dependent DM type II -Per chart repeat A1c 7.4 checked on 03/17/2022 Currently not on any oral medication.  Managing diabetes with diet control. -SSI   History of  CAD -hold PO meds   History of CVA -Continue aspirin, Plavix and Lipitor   Anemia of chronic disease -monitor   Gout -As patient is n.p.o. holding allopurinol      DVT prophylaxis: enoxaparin (LOVENOX) injection 30 mg Start: 11/05/22 1400 SCDs Start: 11/05/22 0135    Code Status: Full Code   Disposition Plan:  Level of care: Med-Surg Status is: Inpatient Remains inpatient appropriate    Consultants:  GS   Subjective: No SOB, no CP  Objective: Vitals:   11/05/22 0216 11/05/22 0258 11/05/22 0805 11/05/22 1208  BP: (!) 141/58 (!) 160/72 (!) 150/65 (!) 161/58  Pulse: 76 73 67 66  Resp: 13 18 17 17   Temp: 98.6 F (37 C) 98.8 F (37.1 C) 98.2 F (36.8 C) 98.3 F (36.8 C)  TempSrc: Oral Oral Oral Oral  SpO2: 98% 100% 100% 100%  Weight:  72.3 kg    Height:  5\' 7"  (1.702 m)      Intake/Output Summary (Last 24 hours) at 11/05/2022 1226 Last data filed at 11/05/2022 1218 Gross per 24 hour  Intake --  Output 150 ml  Net -150 ml   Filed Weights   11/05/22 0258  Weight: 72.3 kg    Examination:   General: Appearance:    Well developed, well nourished female in no acute distress     Lungs:     respirations unlabored  Heart:    Normal heart rate.     MS:   All extremities are intact.    Neurologic:   Awake, alert       Data Reviewed: I have personally reviewed following labs and imaging studies  CBC: Recent  Labs  Lab 11/04/22 1326 11/05/22 0215  WBC 5.7 4.1  NEUTROABS 4.6  --   HGB 11.3* 10.3*  HCT 36.6 33.1*  MCV 91.7 90.4  PLT 256 241   Basic Metabolic Panel: Recent Labs  Lab 11/04/22 1326 11/05/22 0215  NA 138 140  K 4.1 4.1  CL 103 108  CO2 20* 24  GLUCOSE 255* 226*  BUN 34* 27*  CREATININE 1.39* 1.04*  CALCIUM 9.8 9.0   GFR: Estimated Creatinine Clearance: 35.7 mL/min (A) (by C-G formula based on SCr of 1.04 mg/dL (H)). Liver Function Tests: Recent Labs  Lab 11/04/22 1326 11/05/22 0215  AST 22 19  ALT 14 12  ALKPHOS 88  74  BILITOT 0.6 0.7  PROT 7.7 6.6  ALBUMIN 3.9 3.3*   Recent Labs  Lab 11/04/22 1326  LIPASE 28   No results for input(s): "AMMONIA" in the last 168 hours. Coagulation Profile: No results for input(s): "INR", "PROTIME" in the last 168 hours. Cardiac Enzymes: No results for input(s): "CKTOTAL", "CKMB", "CKMBINDEX", "TROPONINI" in the last 168 hours. BNP (last 3 results) No results for input(s): "PROBNP" in the last 8760 hours. HbA1C: No results for input(s): "HGBA1C" in the last 72 hours. CBG: Recent Labs  Lab 11/05/22 0213 11/05/22 0600 11/05/22 1207  GLUCAP 207* 182* 164*   Lipid Profile: No results for input(s): "CHOL", "HDL", "LDLCALC", "TRIG", "CHOLHDL", "LDLDIRECT" in the last 72 hours. Thyroid Function Tests: No results for input(s): "TSH", "T4TOTAL", "FREET4", "T3FREE", "THYROIDAB" in the last 72 hours. Anemia Panel: No results for input(s): "VITAMINB12", "FOLATE", "FERRITIN", "TIBC", "IRON", "RETICCTPCT" in the last 72 hours. Sepsis Labs: No results for input(s): "PROCALCITON", "LATICACIDVEN" in the last 168 hours.  No results found for this or any previous visit (from the past 240 hour(s)).       Radiology Studies: DG Chest Portable 1 View  Result Date: 11/04/2022 CLINICAL DATA:  Confirm NG tube placement EXAM: PORTABLE CHEST 1 VIEW COMPARISON:  CT chest 11/18/2018 FINDINGS: Enteric tube tip projects over the left upper quadrant consistent with location in the upper stomach. Heart size and pulmonary vascularity are normal. Linear fibrosis or atelectasis in the lung bases. Peribronchial thickening and central interstitial changes likely due to chronic bronchitis. No focal consolidation. No pleural effusions. No pneumothorax. Calcified and tortuous aorta. Degenerative changes in the spine and shoulders. IMPRESSION: Enteric tube tip projects over the left upper quadrant consistent with location in the body of the stomach. Chronic bronchitic changes in the lungs with  fibrosis or atelectasis in the lung bases. Electronically Signed   By: Burman Nieves M.D.   On: 11/04/2022 23:50   CT ABDOMEN PELVIS WO CONTRAST  Result Date: 11/04/2022 CLINICAL DATA:  Acute nonlocalized abdominal pain EXAM: CT ABDOMEN AND PELVIS WITHOUT CONTRAST TECHNIQUE: Multidetector CT imaging of the abdomen and pelvis was performed following the standard protocol without IV contrast. RADIATION DOSE REDUCTION: This exam was performed according to the departmental dose-optimization program which includes automated exposure control, adjustment of the mA and/or kV according to patient size and/or use of iterative reconstruction technique. COMPARISON:  CT abdomen and pelvis 03/17/2022 FINDINGS: Lower chest: No acute abnormality. Hepatobiliary: Cholecystectomy. Unremarkable noncontrast appearance of the liver. Prominent bile ducts due to reservoir effect. Pancreas: Unremarkable. Spleen: Unremarkable. Adrenals/Urinary Tract: Normal adrenal glands. No urinary calculi or hydronephrosis. Unremarkable bladder. Stomach/Bowel: There are few dilated loops of small bowel in the left hemiabdomen measuring up to 3.6 cm in diameter. A discrete transition point is not identified. Stomach is  within normal limits. Appendix is not definitively visualized. Vascular/Lymphatic: Aortic atherosclerosis. No enlarged abdominal or pelvic lymph nodes. IVC filter. Reproductive: Hysterectomy. Other: No free intraperitoneal air. No free fluid. Coarse calcifications in the bilateral gluteal soft tissues. Musculoskeletal: No acute fracture. IMPRESSION: 1. Dilated loops of jejunum in the left hemiabdomen. A discrete transition point is not identified however obstruction is difficult to exclude. Aortic Atherosclerosis (ICD10-I70.0). Electronically Signed   By: Minerva Fester M.D.   On: 11/04/2022 23:08        Scheduled Meds:  amLODipine  5 mg Oral Daily   atorvastatin  40 mg Oral q AM   brimonidine  1 drop Both Eyes q AM    clopidogrel  75 mg Oral Daily   dorzolamide  1 drop Both Eyes q AM   enoxaparin (LOVENOX) injection  30 mg Subcutaneous Q24H   latanoprost  1 drop Both Eyes QHS   sodium chloride flush  3 mL Intravenous Q12H   Continuous Infusions:  sodium chloride     lactated ringers 100 mL/hr at 11/05/22 1212     LOS: 0 days    Time spent: 45 minutes spent on chart review, discussion with nursing staff, consultants, updating family and interview/physical exam; more than 50% of that time was spent in counseling and/or coordination of care.    Joseph Art, DO [facility] Available via Epic secure chat 7am-7pm After these hours, please refer to coverage provider listed on [email or web address] 11/05/2022, 12:26 PM

## 2022-11-06 DIAGNOSIS — K56609 Unspecified intestinal obstruction, unspecified as to partial versus complete obstruction: Secondary | ICD-10-CM | POA: Diagnosis not present

## 2022-11-06 LAB — GLUCOSE, CAPILLARY
Glucose-Capillary: 124 mg/dL — ABNORMAL HIGH (ref 70–99)
Glucose-Capillary: 110 mg/dL — ABNORMAL HIGH (ref 70–99)
Glucose-Capillary: 197 mg/dL — ABNORMAL HIGH (ref 70–99)

## 2022-11-06 MED ORDER — TORSEMIDE 20 MG PO TABS: 20.0000 mg | ORAL_TABLET | Freq: Every day | ORAL | Status: AC

## 2022-11-06 MED ORDER — CLOPIDOGREL BISULFATE 75 MG PO TABS
75.0000 mg | ORAL_TABLET | Freq: Every day | ORAL | Status: DC
  Administered 2022-11-06: 75 mg via ORAL
  Filled 2022-11-06: qty 1

## 2022-11-06 MED ORDER — LOSARTAN POTASSIUM 25 MG PO TABS
25.0000 mg | ORAL_TABLET | Freq: Every day | ORAL | Status: DC
  Administered 2022-11-06: 25 mg via ORAL
  Filled 2022-11-06: qty 1

## 2022-11-06 MED ORDER — ONDANSETRON HCL 4 MG PO TABS: 4.0000 mg | ORAL_TABLET | Freq: Four times a day (QID) | ORAL | 0 refills | Status: AC | PRN

## 2022-11-06 MED ORDER — TRAMADOL HCL 50 MG PO TABS
50.0000 mg | ORAL_TABLET | Freq: Two times a day (BID) | ORAL | Status: DC | PRN
  Administered 2022-11-06: 50 mg via ORAL
  Filled 2022-11-06 (×2): qty 1

## 2022-11-06 NOTE — Plan of Care (Signed)

## 2022-11-06 NOTE — Discharge Summary (Signed)
Physician Discharge Summary  Katherine [name] ZOX:[id number] DOB: 1930/03/21 DOA: 11/04/2022  PCP: Rinaldo Cloud, MD  Admit date: 11/04/2022 Discharge date: 11/06/2022  Admitted From: home Discharge disposition: home   Recommendations for Outpatient Follow-Up:   Needs bowel regimen   Discharge Diagnosis:   Principal Problem:   SBO (small bowel obstruction) (HCC) Active Problems:   Acute kidney injury (HCC)   Essential hypertension   Chronic diastolic CHF (congestive heart failure) (HCC)   Paroxysmal atrial fibrillation (HCC)   Controlled type 2 diabetes mellitus without complication, without long-term current use of insulin (HCC)   History of CVA (cerebrovascular accident)   History of CAD (coronary artery disease)   CKD (chronic kidney disease) stage 3, GFR 30-59 ml/min (HCC)   Chronic dyspnea   Chronic anemia   Gout   History of glaucoma    Discharge Condition: Improved.  Diet recommendation: soft, advance as tolerated  Wound care: None.  Code status: Full.   History of Present Illness:   Katherine [name] is a 89 y.o. female with medical history significant of diabetes type 2, hypertension, hyperlipidemia, paroxysmal atrial fibrillation not on anticoagulation, CVA on Plavix, CAD, chronic diastolic heart failure, mild to moderate aortic stenosis, CKD stage IIIA, and chronic dyspnea presented to emergency department via EMS for evaluation for abdominal pain that started on Sunday.  Patient also has some associated nausea, vomiting and diarrhea. Patient is complaining about abdominal pain which has been slightly improved since NG tube has been placed in the ED.  She is able to pass flatus.  And patient had a bowel movement in the afternoon today and 11/04/2022.       Hospital Course by Problem:   Small bowel obstruction Abdominal pain Nausea and vomiting History of recurrent SBO -Patient presenting with complaining of abdominal pain, nausea and 2-3 episodes of  vomiting earlier tonight.  She has cramping abdominal pain.  Had bowel movement earlier today 11/04/2022 and able to pass gas. - CMP unremarkable for any electrolyte derangement. - CT abdomen pelvis showed dilated loop of jejunum in the left hemiabdomen, a discrete transition point is not identified however obstruction is difficult to exclude.  -per GS: SBO has resolved.  X-ray reviewed and oral contrast in her colon and significant number of BMs overnight. -DC NGT, adv to FLD, if she tolerates this, she can DC home and adv her diet as she tolerates at home   AKI on CKD stage 3A -Lab check showed elevated creatinine 1.39.  Baseline creatinine around 0.7 and and baseline GFR in between 48-53. -Acute kidney injury in setting of vomiting and poor oral intake and rehydration. - resume home meds   Grade 1 diastolic heart failure with preserved EF 75% Essential hypertension - resume home meds     History of paroxysmal atrial fibrillation-not on anticoagulation -EKG showed normal sinus rhythm and ventricular bigeminy.  Heart rate 68. Rate controlled.  Continue to monitor   Non-insulin-dependent DM type II -Per chart repeat A1c 7.4 checked on 03/17/2022 Currently not on any oral medication.  Managing diabetes with diet control.    History of CAD -resume home meds   History of CVA -Continue aspirin, Plavix and Lipitor   Anemia of chronic disease -monitor   Gout -resume home meds        Medical Consultants:   GS   Discharge Exam:   Vitals:   11/06/22 0420 11/06/22 0827  BP: (!) 165/69 (!) 154/77  Pulse: 66 67  Resp: 18 17  Temp: 98.2 F (36.8 C) 97.9 F (36.6 C)  SpO2: 97% 100%   Vitals:   11/05/22 1946 11/06/22 0420 11/06/22 0438 11/06/22 0827  BP: (!) 153/72 (!) 165/69  (!) 154/77  Pulse: 69 66  67  Resp: 17 18  17   Temp: 98.7 F (37.1 C) 98.2 F (36.8 C)  97.9 F (36.6 C)  TempSrc: Oral Oral  Oral  SpO2: 100% 97%  100%  Weight:   73.9 kg   Height:         General exam: Appears calm and comfortable.   The results of significant diagnostics from this hospitalization (including imaging, microbiology, ancillary and laboratory) are listed below for reference.     Procedures and Diagnostic Studies:   DG Abd Portable 1V-Small Bowel Obstruction Protocol-initial, 8 hr delay  Result Date: 11/05/2022 CLINICAL DATA:  Small bowel obstruction. EXAM: PORTABLE ABDOMEN - 1 VIEW COMPARISON:  CT yesterday FINDINGS: Enteric tube is in the stomach, the tube is kinked at the side port with the tip directed cranially towards the gastric cardia. Administered enteric contrast is seen throughout the colon to the level of the rectum. Few air-filled loops of small bowel in the left abdomen are not dilated by size criteria. There are bilateral gluteal calcifications. IMPRESSION: 1. Administered enteric contrast is seen throughout the colon to the level of the rectum. No evidence of obstruction 2. Enteric tube in the stomach, the tube is kinked at the side port with the tip directed cranially. Electronically Signed   By: Narda Rutherford M.D.   On: 11/05/2022 21:43   DG Chest Portable 1 View  Result Date: 11/04/2022 CLINICAL DATA:  Confirm NG tube placement EXAM: PORTABLE CHEST 1 VIEW COMPARISON:  CT chest 11/18/2018 FINDINGS: Enteric tube tip projects over the left upper quadrant consistent with location in the upper stomach. Heart size and pulmonary vascularity are normal. Linear fibrosis or atelectasis in the lung bases. Peribronchial thickening and central interstitial changes likely due to chronic bronchitis. No focal consolidation. No pleural effusions. No pneumothorax. Calcified and tortuous aorta. Degenerative changes in the spine and shoulders. IMPRESSION: Enteric tube tip projects over the left upper quadrant consistent with location in the body of the stomach. Chronic bronchitic changes in the lungs with fibrosis or atelectasis in the lung bases. Electronically Signed    By: Burman Nieves M.D.   On: 11/04/2022 23:50   CT ABDOMEN PELVIS WO CONTRAST  Result Date: 11/04/2022 CLINICAL DATA:  Acute nonlocalized abdominal pain EXAM: CT ABDOMEN AND PELVIS WITHOUT CONTRAST TECHNIQUE: Multidetector CT imaging of the abdomen and pelvis was performed following the standard protocol without IV contrast. RADIATION DOSE REDUCTION: This exam was performed according to the departmental dose-optimization program which includes automated exposure control, adjustment of the mA and/or kV according to patient size and/or use of iterative reconstruction technique. COMPARISON:  CT abdomen and pelvis 03/17/2022 FINDINGS: Lower chest: No acute abnormality. Hepatobiliary: Cholecystectomy. Unremarkable noncontrast appearance of the liver. Prominent bile ducts due to reservoir effect. Pancreas: Unremarkable. Spleen: Unremarkable. Adrenals/Urinary Tract: Normal adrenal glands. No urinary calculi or hydronephrosis. Unremarkable bladder. Stomach/Bowel: There are few dilated loops of small bowel in the left hemiabdomen measuring up to 3.6 cm in diameter. A discrete transition point is not identified. Stomach is within normal limits. Appendix is not definitively visualized. Vascular/Lymphatic: Aortic atherosclerosis. No enlarged abdominal or pelvic lymph nodes. IVC filter. Reproductive: Hysterectomy. Other: No free intraperitoneal air. No free fluid. Coarse calcifications in the bilateral gluteal soft tissues.  Musculoskeletal: No acute fracture. IMPRESSION: 1. Dilated loops of jejunum in the left hemiabdomen. A discrete transition point is not identified however obstruction is difficult to exclude. Aortic Atherosclerosis (ICD10-I70.0). Electronically Signed   By: Minerva Fester M.D.   On: 11/04/2022 23:08     Labs:   Basic Metabolic Panel: Recent Labs  Lab 11/04/22 1326 11/05/22 0215  NA 138 140  K 4.1 4.1  CL 103 108  CO2 20* 24  GLUCOSE 255* 226*  BUN 34* 27*  CREATININE 1.39* 1.04*   CALCIUM 9.8 9.0   GFR Estimated Creatinine Clearance: 35.7 mL/min (A) (by C-G formula based on SCr of 1.04 mg/dL (H)). Liver Function Tests: Recent Labs  Lab 11/04/22 1326 11/05/22 0215  AST 22 19  ALT 14 12  ALKPHOS 88 74  BILITOT 0.6 0.7  PROT 7.7 6.6  ALBUMIN 3.9 3.3*   Recent Labs  Lab 11/04/22 1326  LIPASE 28   No results for input(s): "AMMONIA" in the last 168 hours. Coagulation profile No results for input(s): "INR", "PROTIME" in the last 168 hours.  CBC: Recent Labs  Lab 11/04/22 1326 11/05/22 0215  WBC 5.7 4.1  NEUTROABS 4.6  --   HGB 11.3* 10.3*  HCT 36.6 33.1*  MCV 91.7 90.4  PLT 256 241   Cardiac Enzymes: No results for input(s): "CKTOTAL", "CKMB", "CKMBINDEX", "TROPONINI" in the last 168 hours. BNP: Invalid input(s): "POCBNP" CBG: Recent Labs  Lab 11/05/22 1207 11/05/22 1734 11/05/22 2322 11/06/22 0442 11/06/22 1231  GLUCAP 164* 114* 112* 124* 110*   D-Dimer No results for input(s): "DDIMER" in the last 72 hours. Hgb A1c No results for input(s): "HGBA1C" in the last 72 hours. Lipid Profile No results for input(s): "CHOL", "HDL", "LDLCALC", "TRIG", "CHOLHDL", "LDLDIRECT" in the last 72 hours. Thyroid function studies No results for input(s): "TSH", "T4TOTAL", "T3FREE", "THYROIDAB" in the last 72 hours.  Invalid input(s): "FREET3" Anemia work up No results for input(s): "VITAMINB12", "FOLATE", "FERRITIN", "TIBC", "IRON", "RETICCTPCT" in the last 72 hours. Microbiology No results found for this or any previous visit (from the past 240 hour(s)).   Discharge Instructions:   Discharge Instructions     Discharge instructions   Complete by: As directed    Advance diet tolerated Bowel regimen to avoid constipation   Increase activity slowly   Complete by: As directed       Allergies as of 11/06/2022       Reactions   Asa [aspirin] Hives   Blue Dyes (parenteral) Hives   Motrin [ibuprofen] Hives   Contrast Media [iodinated  Contrast Media] Other (See Comments)   Unknown reaction   Iohexol Other (See Comments)   Unknown reaction   Shellfish Allergy Hives        Medication List     TAKE these medications    allopurinol 100 MG tablet Commonly known as: ZYLOPRIM Take 100 mg by mouth in the morning.   amLODipine 5 MG tablet Commonly known as: NORVASC Take 5 mg by mouth daily.   atorvastatin 40 MG tablet Commonly known as: LIPITOR Take 40 mg by mouth in the morning.   brimonidine 0.1 % Soln Commonly known as: ALPHAGAN P Place 1 drop into both eyes in the morning.   clopidogrel 75 MG tablet Commonly known as: PLAVIX Take 1 tablet (75 mg total) by mouth daily. What changed: when to take this   dicyclomine 10 MG capsule Commonly known as: BENTYL Take 10 mg by mouth 2 (two) times daily as needed for  spasms.   dorzolamide 2 % ophthalmic solution Commonly known as: TRUSOPT Place 1 drop into both eyes in the morning.   ergocalciferol 1.25 MG (50000 UT) capsule Commonly known as: VITAMIN D2 Take 50,000 Units by mouth every Saturday.   estradiol 0.1 MG/GM vaginal cream Commonly known as: ESTRACE Place 1 Applicatorful vaginally every other day. APPLY A FINGERTIP LENGTH AMOUNT AROUND THE URETHRA EVERY OTHER NIGHT. ENSURE TO RUB IN.   fentaNYL 12 MCG/HR Commonly known as: DURAGESIC Place 1 patch onto the skin every 3 (three) days.   losartan 25 MG tablet Commonly known as: COZAAR Take 25 mg by mouth daily.   nitrofurantoin (macrocrystal-monohydrate) 100 MG capsule Commonly known as: MACROBID Take 100 mg by mouth 2 (two) times daily.   nitroGLYCERIN 0.4 mg/hr patch Commonly known as: NITRODUR - Dosed in mg/24 hr Place 0.4 mg onto the skin in the morning.   omeprazole 40 MG capsule Commonly known as: PRILOSEC Take 1 capsule (40 mg total) by mouth daily. What changed: when to take this   ondansetron 4 MG tablet Commonly known as: ZOFRAN Take 1 tablet (4 mg total) by mouth every 6  (six) hours as needed for nausea.   phenazopyridine 200 MG tablet Commonly known as: PYRIDIUM Take 200 mg by mouth 3 (three) times daily as needed for pain.   Rocklatan 0.02-0.005 % Soln Generic drug: Netarsudil-Latanoprost Place 1 drop into both eyes at bedtime.   torsemide 20 MG tablet Commonly known as: DEMADEX Take 1 tablet (20 mg total) by mouth daily. Start taking on: November 11, 2022 What changed: These instructions start on November 11, 2022. If you are unsure what to do until then, ask your doctor or other care provider.          Time coordinating discharge: 45 min  Signed:  Joseph Art DO  [facility] 11/06/2022, 12:55 PM

## 2022-11-06 NOTE — Progress Notes (Signed)
Called Jodi Mourning, sister and notified her that the ambulance was here to get Kianny, she acknowledged.

## 2022-11-06 NOTE — Progress Notes (Signed)
Subjective: C/o having to go to the bathroom a lot overnight secondary to diarrhea.  Nothing much out of NGT.  Still with some abdominal discomfort as usual for her with these episodes.  ROS: See above, otherwise other systems negative  Objective: Vital signs in last 24 hours: Temp:  [97.9 F (36.6 C)-98.9 F (37.2 C)] 97.9 F (36.6 C) (09/05 0827) Pulse Rate:  [66-70] 67 (09/05 0827) Resp:  [17-18] 17 (09/05 0827) BP: (153-165)/(58-77) 154/77 (09/05 0827) SpO2:  [97 %-100 %] 100 % (09/05 0827) Weight:  [73.9 kg] 73.9 kg (09/05 0438) Last BM Date : 11/05/22  Intake/Output from previous day: 09/04 0701 - 09/05 0700 In: 2671.5 [I.V.:2611.5; NG/GT:60] Out: 350 [Emesis/NG output:350] Intake/Output this shift: No intake/output data recorded.  PE: Abd: soft, mild upper abdominal tenderness, NGT with minimal output, ND  Lab Results:  Recent Labs    11/04/22 1326 11/05/22 0215  WBC 5.7 4.1  HGB 11.3* 10.3*  HCT 36.6 33.1*  PLT 256 241   BMET Recent Labs    11/04/22 1326 11/05/22 0215  NA 138 140  K 4.1 4.1  CL 103 108  CO2 20* 24  GLUCOSE 255* 226*  BUN 34* 27*  CREATININE 1.39* 1.04*  CALCIUM 9.8 9.0   PT/INR No results for input(s): "LABPROT", "INR" in the last 72 hours. CMP     Component Value Date/Time   NA 140 11/05/2022 0215   K 4.1 11/05/2022 0215   CL 108 11/05/2022 0215   CO2 24 11/05/2022 0215   GLUCOSE 226 (H) 11/05/2022 0215   BUN 27 (H) 11/05/2022 0215   CREATININE 1.04 (H) 11/05/2022 0215   CALCIUM 9.0 11/05/2022 0215   PROT 6.6 11/05/2022 0215   ALBUMIN 3.3 (L) 11/05/2022 0215   AST 19 11/05/2022 0215   ALT 12 11/05/2022 0215   ALKPHOS 74 11/05/2022 0215   BILITOT 0.7 11/05/2022 0215   GFRNONAA 51 (L) 11/05/2022 0215   GFRAA 35 (L) 05/08/2019 2043   Lipase     Component Value Date/Time   LIPASE 28 11/04/2022 1326       Studies/Results: DG Abd Portable 1V-Small Bowel Obstruction Protocol-initial, 8 hr delay  Result  Date: 11/05/2022 CLINICAL DATA:  Small bowel obstruction. EXAM: PORTABLE ABDOMEN - 1 VIEW COMPARISON:  CT yesterday FINDINGS: Enteric tube is in the stomach, the tube is kinked at the side port with the tip directed cranially towards the gastric cardia. Administered enteric contrast is seen throughout the colon to the level of the rectum. Few air-filled loops of small bowel in the left abdomen are not dilated by size criteria. There are bilateral gluteal calcifications. IMPRESSION: 1. Administered enteric contrast is seen throughout the colon to the level of the rectum. No evidence of obstruction 2. Enteric tube in the stomach, the tube is kinked at the side port with the tip directed cranially. Electronically Signed   By: Narda Rutherford M.D.   On: 11/05/2022 21:43   DG Chest Portable 1 View  Result Date: 11/04/2022 CLINICAL DATA:  Confirm NG tube placement EXAM: PORTABLE CHEST 1 VIEW COMPARISON:  CT chest 11/18/2018 FINDINGS: Enteric tube tip projects over the left upper quadrant consistent with location in the upper stomach. Heart size and pulmonary vascularity are normal. Linear fibrosis or atelectasis in the lung bases. Peribronchial thickening and central interstitial changes likely due to chronic bronchitis. No focal consolidation. No pleural effusions. No pneumothorax. Calcified and tortuous aorta. Degenerative changes in the spine and shoulders.  IMPRESSION: Enteric tube tip projects over the left upper quadrant consistent with location in the body of the stomach. Chronic bronchitic changes in the lungs with fibrosis or atelectasis in the lung bases. Electronically Signed   By: Burman Nieves M.D.   On: 11/04/2022 23:50   CT ABDOMEN PELVIS WO CONTRAST  Result Date: 11/04/2022 CLINICAL DATA:  Acute nonlocalized abdominal pain EXAM: CT ABDOMEN AND PELVIS WITHOUT CONTRAST TECHNIQUE: Multidetector CT imaging of the abdomen and pelvis was performed following the standard protocol without IV contrast.  RADIATION DOSE REDUCTION: This exam was performed according to the departmental dose-optimization program which includes automated exposure control, adjustment of the mA and/or kV according to patient size and/or use of iterative reconstruction technique. COMPARISON:  CT abdomen and pelvis 03/17/2022 FINDINGS: Lower chest: No acute abnormality. Hepatobiliary: Cholecystectomy. Unremarkable noncontrast appearance of the liver. Prominent bile ducts due to reservoir effect. Pancreas: Unremarkable. Spleen: Unremarkable. Adrenals/Urinary Tract: Normal adrenal glands. No urinary calculi or hydronephrosis. Unremarkable bladder. Stomach/Bowel: There are few dilated loops of small bowel in the left hemiabdomen measuring up to 3.6 cm in diameter. A discrete transition point is not identified. Stomach is within normal limits. Appendix is not definitively visualized. Vascular/Lymphatic: Aortic atherosclerosis. No enlarged abdominal or pelvic lymph nodes. IVC filter. Reproductive: Hysterectomy. Other: No free intraperitoneal air. No free fluid. Coarse calcifications in the bilateral gluteal soft tissues. Musculoskeletal: No acute fracture. IMPRESSION: 1. Dilated loops of jejunum in the left hemiabdomen. A discrete transition point is not identified however obstruction is difficult to exclude. Aortic Atherosclerosis (ICD10-I70.0). Electronically Signed   By: Minerva Fester M.D.   On: 11/04/2022 23:08    Anti-infectives: Anti-infectives (From admission, onward)    None        Assessment/Plan SBO -this has resolved.  X-ray reviewed and oral contrast in her colon and significant number of BMs overnight. -DC NGT, adv to FLD -if she tolerates this, she can DC home and adv her diet as she tolerates at home -d/w primary service.   FEN - FLD VTE - plavix ID - none needed  I reviewed hospitalist notes, last 24 h vitals and pain scores, last 48 h intake and output, last 24 h labs and trends, and last 24 h imaging  results.   LOS: 1 day    Letha Cape , [facility] 11/06/2022, 9:11 AM Please see Amion for pager number during day hours 7:00am-4:30pm or 7:00am -11:30am on weekends

## 2022-11-06 NOTE — TOC Initial Note (Addendum)
Transition of Care [facility]) - Initial/Assessment Note    Patient Details  Name: Katherine [name] MRN: [id number] Date of Birth: 1930-07-19  Transition of Care [facility]) CM/SW Contact:    Kingsley Plan, RN Phone Number: 11/06/2022, 4:28 PM  Clinical Narrative:                  Patient from home alone, has DME. Patient has daily aide service .   Patient requesting ambulance transportation home. Confirmed address. Patient states sister Corrie Dandy can let her in her apartment. [facility] [phone] no answer no voicemail. Called sister Seward Grater. Explained to [facility] patient has been discharged and requesting ambulance transportation, however unable to reach [facility]. [name] stated she will get in touch with Corrie Dandy and Corrie Dandy will be at [place] address within 30 minutes to let her in. [name] understands ambulance cannot give a definite time of transportation. [name] aware NCM calling transportation now and assures NCM Corrie Dandy will be at the apartment. While NCM speaking to Seward Grater, patient's sister Maralyn Sago called patient and Jayceona told Maralyn Sago she is being discharged.   Called [facility] , only 3 patient's ahead of Camera. Left [name] a voicemail . Called Corrie Dandy she answered and aware Rilynn is on her way home shortly. Corrie Dandy will be at [place]'s address to let her in  Expected Discharge Plan: Home/Self Care Barriers to Discharge: No Barriers Identified   Patient Goals and CMS Choice Patient states their goals for this hospitalization and ongoing recovery are:: to return to home          Expected Discharge Plan and Services   Discharge Planning Services: CM Consult   Living arrangements for the past 2 months: Apartment Expected Discharge Date: 11/06/22               DME Arranged: N/A         HH Arranged: NA          Prior Living Arrangements/Services Living arrangements for the past 2 months: Apartment Lives with:: Self Patient language and need for interpreter reviewed:: Yes Do you feel safe going back to the place  where you live?: Yes      Need for Family Participation in Patient Care: Yes (Comment) Care giver support system in place?: Yes (comment) (aides) Current home services: DME Criminal Activity/Legal Involvement Pertinent to Current Situation/Hospitalization: No - Comment as needed  Activities of Daily Living Home Assistive Devices/Equipment: Cane (specify quad or straight), Walker (specify type) ADL Screening (condition at time of admission) Patient's cognitive ability adequate to safely complete daily activities?: Yes Is the patient deaf or have difficulty hearing?: No Does the patient have difficulty seeing, even when wearing glasses/contacts?: No Does the patient have difficulty concentrating, remembering, or making decisions?: No Patient able to express need for assistance with ADLs?: Yes Does the patient have difficulty dressing or bathing?: No Independently performs ADLs?: No Communication: Independent Dressing (OT): Appropriate for developmental age Grooming: Needs assistance Is this a change from baseline?: Change from baseline, expected to last >3 days Feeding: Independent Bathing: Appropriate for developmental age Toileting: Appropriate for developmental age In/Out Bed: Appropriate for developmental age 89 in Home: Appropriate for developmental age Does the patient have difficulty walking or climbing stairs?: Yes Weakness of Legs: Both Weakness of Arms/Hands: Both  Permission Sought/Granted   Permission granted to share information with : Yes, Verbal Permission Granted  Share Information with NAME: Sisters , Stevan Born           Emotional Assessment  Appearance:: Appears stated age Attitude/Demeanor/Rapport: Engaged Affect (typically observed): Accepting Orientation: : Oriented to Self, Oriented to Place, Oriented to  Time, Oriented to Situation Alcohol / Substance Use: Not Applicable Psych Involvement: No (comment)  Admission diagnosis:  SBO (small  bowel obstruction) (HCC) [K56.609] Patient Active Problem List   Diagnosis Date Noted   Controlled type 2 diabetes mellitus without complication, without long-term current use of insulin (HCC) 11/05/2022   History of CVA (cerebrovascular accident) 11/05/2022   History of CAD (coronary artery disease) 11/05/2022   CKD (chronic kidney disease) stage 3, GFR 30-59 ml/min (HCC) 11/05/2022   Chronic dyspnea 11/05/2022   Acute kidney injury (HCC) 11/05/2022   Chronic anemia 11/05/2022   Gout 11/05/2022   History of glaucoma 11/05/2022   SBO (small bowel obstruction) (HCC) 03/17/2022   Type 2 diabetes mellitus with complication, with long-term current use of insulin (HCC) 03/17/2022   Paroxysmal atrial fibrillation (HCC) 03/17/2022   Dizziness 02/02/2020   Arterial ischemic stroke, vertebrobasilar, brainstem, acute (HCC) 02/02/2020   Essential hypertension 02/02/2020   Cerebrovascular disease 02/02/2020   Mass of urinary bladder 02/02/2020   Hematuria 02/02/2020   CAD (coronary artery disease) 02/02/2020   Chronic diastolic CHF (congestive heart failure) (HCC) 02/02/2020   Small bowel obstruction (HCC) 10/31/2017   Abdominal pain    Nausea and vomiting    Acute colitis 05/30/2017   Chest pain 12/21/2015   ACS (acute coronary syndrome) (HCC) 12/21/2015   Bowel obstruction (HCC) 10/15/2013   Angioedema due to seafood allergy 09/11/2013   Nipple discharge, bloody 01/07/2012   PCP:  Rinaldo Cloud, MD Pharmacy:   [facility] - [place], [place] - [address] AT [address] [place] [address] Phone: [phone] Fax: [phone]  [id number] - [place], [place] - [address] AT [facility] [address] [place] [address] Phone: [phone] Fax: [phone]     Social Determinants of Health (SDOH) Social History: SDOH Screenings   Food Insecurity: No Food Insecurity (11/05/2022)  Housing: Low Risk  (11/05/2022)   Transportation Needs: No Transportation Needs (11/05/2022)  Utilities: Not At Risk (11/05/2022)  Tobacco Use: Medium Risk (11/05/2022)   SDOH Interventions:     Readmission Risk Interventions     No data to display

## 2022-11-17 ENCOUNTER — Telehealth: Payer: Self-pay | Admitting: *Deleted

## 2022-11-17 NOTE — Transitions of Care (Post Inpatient/ED Visit) (Signed)
11/17/2022  Name: AVRIANA RINKUS MRN: [id number] DOB: 1930/07/10  Today's TOC FU Call Status: Today's TOC FU Call Status:: Unsuccessful Call (1st Attempt) Unsuccessful Call (1st Attempt) Date: 11/17/22  Attempted to reach the patient regarding the most recent Inpatient/ED visit.  Follow Up Plan: Additional outreach attempts will be made to reach the patient to complete the Transitions of Care (Post Inpatient/ED visit) call.   Irving Shows [facility], BSN [place]/ Ambulatory Care Management [phone]

## 2022-11-18 ENCOUNTER — Telehealth: Payer: Self-pay | Admitting: *Deleted

## 2022-11-18 NOTE — Transitions of Care (Post Inpatient/ED Visit) (Signed)
11/18/2022  Name: Katherine [name] MRN: [id number] DOB: [birth year]  Today's TOC FU Call Status: Today's TOC FU Call Status:: Unsuccessful Call (2nd Attempt) Unsuccessful Call (2nd Attempt) Date: 11/18/22  Attempted to reach the patient regarding the most recent Inpatient/ED visit.  Follow Up Plan: Additional outreach attempts will be made to reach the patient to complete the Transitions of Care (Post Inpatient/ED visit) call.   Irving Shows [facility], BSN [place]/ Ambulatory Care Management [phone]

## 2022-11-19 ENCOUNTER — Telehealth: Payer: Self-pay | Admitting: *Deleted

## 2022-11-19 NOTE — Transitions of Care (Post Inpatient/ED Visit) (Signed)
11/19/2022  Name: Katherine [name] MRN: [id number] DOB: 1930/08/22  Today's TOC FU Call Status: Today's TOC FU Call Status:: Unsuccessful Call (3rd Attempt) Unsuccessful Call (3rd Attempt) Date: 11/19/22  Attempted to reach the patient regarding the most recent Inpatient/ED visit.  Follow Up Plan: No further outreach attempts will be made at this time. We have been unable to contact the patient.  Irving Shows [facility], BSN [place]/ Ambulatory Care Management [phone]

## 2022-12-08 ENCOUNTER — Other Ambulatory Visit: Payer: Self-pay

## 2022-12-08 ENCOUNTER — Emergency Department (HOSPITAL_COMMUNITY): Payer: Medicare HMO

## 2022-12-08 ENCOUNTER — Inpatient Hospital Stay (HOSPITAL_COMMUNITY)
Admission: EM | Admit: 2022-12-08 | Discharge: 2022-12-13 | DRG: 389 | Disposition: A | Discharge status: Home Health | Payer: Medicare HMO | Attending: Internal Medicine | Admitting: Internal Medicine

## 2022-12-08 ENCOUNTER — Encounter (HOSPITAL_COMMUNITY): Payer: Self-pay | Admitting: Emergency Medicine

## 2022-12-08 DIAGNOSIS — E118 Type 2 diabetes mellitus with unspecified complications: Secondary | ICD-10-CM

## 2022-12-08 DIAGNOSIS — I350 Nonrheumatic aortic (valve) stenosis: Secondary | ICD-10-CM | POA: Diagnosis present

## 2022-12-08 DIAGNOSIS — N3000 Acute cystitis without hematuria: Secondary | ICD-10-CM | POA: Diagnosis not present

## 2022-12-08 DIAGNOSIS — E785 Hyperlipidemia, unspecified: Secondary | ICD-10-CM | POA: Diagnosis present

## 2022-12-08 DIAGNOSIS — Z8249 Family history of ischemic heart disease and other diseases of the circulatory system: Secondary | ICD-10-CM | POA: Diagnosis not present

## 2022-12-08 DIAGNOSIS — Z7902 Long term (current) use of antithrombotics/antiplatelets: Secondary | ICD-10-CM

## 2022-12-08 DIAGNOSIS — I130 Hypertensive heart and chronic kidney disease with heart failure and stage 1 through stage 4 chronic kidney disease, or unspecified chronic kidney disease: Secondary | ICD-10-CM | POA: Diagnosis present

## 2022-12-08 DIAGNOSIS — I5032 Chronic diastolic (congestive) heart failure: Secondary | ICD-10-CM | POA: Diagnosis present

## 2022-12-08 DIAGNOSIS — I2510 Atherosclerotic heart disease of native coronary artery without angina pectoris: Secondary | ICD-10-CM | POA: Diagnosis present

## 2022-12-08 DIAGNOSIS — Z888 Allergy status to other drugs, medicaments and biological substances status: Secondary | ICD-10-CM | POA: Diagnosis not present

## 2022-12-08 DIAGNOSIS — Z87891 Personal history of nicotine dependence: Secondary | ICD-10-CM | POA: Diagnosis not present

## 2022-12-08 DIAGNOSIS — Z79899 Other long term (current) drug therapy: Secondary | ICD-10-CM

## 2022-12-08 DIAGNOSIS — N390 Urinary tract infection, site not specified: Secondary | ICD-10-CM | POA: Diagnosis not present

## 2022-12-08 DIAGNOSIS — B961 Klebsiella pneumoniae [K. pneumoniae] as the cause of diseases classified elsewhere: Secondary | ICD-10-CM | POA: Diagnosis not present

## 2022-12-08 DIAGNOSIS — E1122 Type 2 diabetes mellitus with diabetic chronic kidney disease: Secondary | ICD-10-CM | POA: Diagnosis present

## 2022-12-08 DIAGNOSIS — N1831 Chronic kidney disease, stage 3a: Secondary | ICD-10-CM | POA: Diagnosis present

## 2022-12-08 DIAGNOSIS — Z794 Long term (current) use of insulin: Secondary | ICD-10-CM | POA: Diagnosis not present

## 2022-12-08 DIAGNOSIS — E1165 Type 2 diabetes mellitus with hyperglycemia: Secondary | ICD-10-CM | POA: Diagnosis present

## 2022-12-08 DIAGNOSIS — K56609 Unspecified intestinal obstruction, unspecified as to partial versus complete obstruction: Secondary | ICD-10-CM | POA: Diagnosis present

## 2022-12-08 DIAGNOSIS — R262 Difficulty in walking, not elsewhere classified: Secondary | ICD-10-CM | POA: Diagnosis present

## 2022-12-08 DIAGNOSIS — I10 Essential (primary) hypertension: Secondary | ICD-10-CM | POA: Diagnosis present

## 2022-12-08 DIAGNOSIS — Z91041 Radiographic dye allergy status: Secondary | ICD-10-CM

## 2022-12-08 DIAGNOSIS — E872 Acidosis, unspecified: Secondary | ICD-10-CM | POA: Diagnosis present

## 2022-12-08 DIAGNOSIS — Z886 Allergy status to analgesic agent status: Secondary | ICD-10-CM

## 2022-12-08 DIAGNOSIS — I69351 Hemiplegia and hemiparesis following cerebral infarction affecting right dominant side: Secondary | ICD-10-CM

## 2022-12-08 DIAGNOSIS — I480 Paroxysmal atrial fibrillation: Secondary | ICD-10-CM | POA: Diagnosis present

## 2022-12-08 DIAGNOSIS — K56600 Partial intestinal obstruction, unspecified as to cause: Secondary | ICD-10-CM | POA: Diagnosis present

## 2022-12-08 DIAGNOSIS — Z6825 Body mass index (BMI) 25.0-25.9, adult: Secondary | ICD-10-CM

## 2022-12-08 DIAGNOSIS — R54 Age-related physical debility: Secondary | ICD-10-CM | POA: Diagnosis present

## 2022-12-08 DIAGNOSIS — N183 Chronic kidney disease, stage 3 unspecified: Secondary | ICD-10-CM | POA: Diagnosis present

## 2022-12-08 DIAGNOSIS — Z91013 Allergy to seafood: Secondary | ICD-10-CM | POA: Diagnosis not present

## 2022-12-08 DIAGNOSIS — Z1611 Resistance to penicillins: Secondary | ICD-10-CM | POA: Diagnosis present

## 2022-12-08 DIAGNOSIS — Z90710 Acquired absence of both cervix and uterus: Secondary | ICD-10-CM

## 2022-12-08 LAB — COMPREHENSIVE METABOLIC PANEL
ALT: 10 U/L (ref 0–44)
AST: 15 U/L (ref 15–41)
Albumin: 3.8 g/dL (ref 3.5–5.0)
Alkaline Phosphatase: 81 U/L (ref 38–126)
Anion gap: 17 — ABNORMAL HIGH (ref 5–15)
BUN: 28 mg/dL — ABNORMAL HIGH (ref 8–23)
CO2: 16 mmol/L — ABNORMAL LOW (ref 22–32)
Calcium: 9.5 mg/dL (ref 8.9–10.3)
Chloride: 107 mmol/L (ref 98–111)
Creatinine, Ser: 1.14 mg/dL — ABNORMAL HIGH (ref 0.44–1.00)
GFR, Estimated: 46 mL/min — ABNORMAL LOW (ref >60)
Glucose, Bld: 179 mg/dL — ABNORMAL HIGH (ref 70–99)
Potassium: 3.6 mmol/L (ref 3.5–5.1)
Sodium: 140 mmol/L (ref 135–145)
Total Bilirubin: 0.5 mg/dL (ref 0.3–1.2)
Total Protein: 7 g/dL (ref 6.5–8.1)

## 2022-12-08 LAB — I-STAT CG4 LACTIC ACID, ED: Lactic Acid, Venous: 1.2 mmol/L (ref 0.5–1.9)

## 2022-12-08 LAB — CBC WITH DIFFERENTIAL/PLATELET
Abs Immature Granulocytes: 0.04 10*3/uL (ref 0.00–0.07)
Basophils Absolute: 0 10*3/uL (ref 0.0–0.1)
Basophils Relative: 0 %
Eosinophils Absolute: 0 10*3/uL (ref 0.0–0.5)
Eosinophils Relative: 0 %
HCT: 42 % (ref 36.0–46.0)
Hemoglobin: 12.3 g/dL (ref 12.0–15.0)
Immature Granulocytes: 1 %
Lymphocytes Relative: 9 %
Lymphs Abs: 0.8 10*3/uL (ref 0.7–4.0)
MCH: 28.2 pg (ref 26.0–34.0)
MCHC: 29.3 g/dL — ABNORMAL LOW (ref 30.0–36.0)
MCV: 96.3 fL (ref 80.0–100.0)
Monocytes Absolute: 0.3 10*3/uL (ref 0.1–1.0)
Monocytes Relative: 3 %
Neutro Abs: 7.1 10*3/uL (ref 1.7–7.7)
Neutrophils Relative %: 87 %
Platelets: 151 10*3/uL (ref 150–400)
RBC: 4.36 MIL/uL (ref 3.87–5.11)
RDW: 14.5 % (ref 11.5–15.5)
WBC: 8.2 10*3/uL (ref 4.0–10.5)
nRBC: 0 % (ref 0.0–0.2)

## 2022-12-08 MED ORDER — HEPARIN SODIUM (PORCINE) 5000 UNIT/ML IJ SOLN
5000.0000 [IU] | Freq: Three times a day (TID) | INTRAMUSCULAR | Status: DC
  Administered 2022-12-09 – 2022-12-13 (×12): 5000 [IU] via SUBCUTANEOUS
  Filled 2022-12-08 (×12): qty 1

## 2022-12-08 MED ORDER — ONDANSETRON HCL 4 MG/2ML IJ SOLN
4.0000 mg | Freq: Once | INTRAMUSCULAR | Status: AC
  Administered 2022-12-08: 4 mg via INTRAVENOUS
  Filled 2022-12-08: qty 2

## 2022-12-08 MED ORDER — FENTANYL CITRATE PF 50 MCG/ML IJ SOSY
50.0000 ug | PREFILLED_SYRINGE | Freq: Once | INTRAMUSCULAR | Status: AC
  Administered 2022-12-08: 50 ug via INTRAVENOUS
  Filled 2022-12-08: qty 1

## 2022-12-08 MED ORDER — DIATRIZOATE MEGLUMINE & SODIUM 66-10 % PO SOLN
90.0000 mL | Freq: Once | ORAL | Status: AC
  Administered 2022-12-09: 90 mL via NASOGASTRIC
  Filled 2022-12-08: qty 90

## 2022-12-08 MED ORDER — POTASSIUM CHLORIDE 2 MEQ/ML IV SOLN
INTRAVENOUS | Status: AC
  Filled 2022-12-08 (×4): qty 1000

## 2022-12-08 NOTE — ED Triage Notes (Signed)
Patient BIB EMS for evaluation of abdominal pain.  Hx of recent SOB with treatment 3 weeks ago.  Reports no BM x 3 days.  Vomited yesterday.  Per EMS, abdomen soft with positive bowel sounds.  States symptoms similar to recent SBO

## 2022-12-08 NOTE — ED Provider Notes (Signed)
[place] EMERGENCY DEPARTMENT AT [facility] Provider Note   CSN: [id number] Arrival date & time: 12/08/22  2024     History  Chief Complaint  Patient presents with   Abdominal Pain    Katherine [name] is a 89 y.o. female.  Patient is a 89 year old who presents with abdominal pain.  She has a history of prior small bowel obstructions, most recently last month.  She states she started having pain this evening.  All over her abdomen.  She has had associated nausea and vomiting.  She says it feels similar to her prior small bowel obstructions.  She says her last bowel movement was about 3 days ago.  She does not know if she is passing gas today.  She denies any fevers.  No urinary symptoms.       Home Medications Prior to Admission medications   Medication Sig Start Date End Date Taking? Authorizing Provider  allopurinol (ZYLOPRIM) 100 MG tablet Take 100 mg by mouth in the morning. 12/31/21   [provider]  amLODipine (NORVASC) 5 MG tablet Take 5 mg by mouth daily. 09/06/22   [provider]  atorvastatin (LIPITOR) 40 MG tablet Take 40 mg by mouth in the morning. 02/22/22   [provider]  brimonidine (ALPHAGAN P) 0.1 % SOLN Place 1 drop into both eyes in the morning.    [provider]  clopidogrel (PLAVIX) 75 MG tablet Take 1 tablet (75 mg total) by mouth daily. Patient taking differently: Take 75 mg by mouth in the morning. 06/04/17   Rinaldo Cloud, MD  dicyclomine (BENTYL) 10 MG capsule Take 10 mg by mouth 2 (two) times daily as needed for spasms. 10/17/22   [provider]  dorzolamide (TRUSOPT) 2 % ophthalmic solution Place 1 drop into both eyes in the morning.    [provider]  ergocalciferol (VITAMIN D2) 50000 units capsule Take 50,000 Units by mouth every Saturday.     [provider]  estradiol (ESTRACE) 0.1 MG/GM vaginal cream Place 1 Applicatorful vaginally every other day. APPLY A FINGERTIP LENGTH  AMOUNT AROUND THE URETHRA EVERY OTHER NIGHT. ENSURE TO RUB IN. 02/28/22   [provider]  fentaNYL (DURAGESIC) 12 MCG/HR Place 1 patch onto the skin every 3 (three) days. 08/19/22   [provider]  losartan (COZAAR) 25 MG tablet Take 25 mg by mouth daily. 10/12/22   [provider]  Netarsudil-Latanoprost (ROCKLATAN) 0.02-0.005 % SOLN Place 1 drop into both eyes at bedtime.    [provider]  nitrofurantoin, macrocrystal-monohydrate, (MACROBID) 100 MG capsule Take 100 mg by mouth 2 (two) times daily. 10/23/22   [provider]  nitroGLYCERIN (NITRODUR - DOSED IN MG/24 HR) 0.4 mg/hr patch Place 0.4 mg onto the skin in the morning.    [provider]  omeprazole (PRILOSEC) 40 MG capsule Take 1 capsule (40 mg total) by mouth daily. Patient taking differently: Take 40 mg by mouth in the morning. 01/25/20   Rinaldo Cloud, MD  ondansetron (ZOFRAN) 4 MG tablet Take 1 tablet (4 mg total) by mouth every 6 (six) hours as needed for nausea. 11/06/22   Joseph Art, DO  phenazopyridine (PYRIDIUM) 200 MG tablet Take 200 mg by mouth 3 (three) times daily as needed for pain. 10/23/22   [provider]  torsemide (DEMADEX) 20 MG tablet Take 1 tablet (20 mg total) by mouth daily. 11/11/22   Joseph Art, DO      Allergies  Asa [aspirin], Blue dyes (parenteral), Motrin [ibuprofen], Contrast media [iodinated contrast media], Iohexol, and Shellfish allergy    Review of Systems   Review of Systems  Constitutional:  Negative for chills, diaphoresis, fatigue and fever.  HENT:  Negative for congestion, rhinorrhea and sneezing.   Eyes: Negative.   Respiratory:  Negative for cough, chest tightness and shortness of breath.   Cardiovascular:  Negative for chest pain and leg swelling.  Gastrointestinal:  Positive for abdominal pain, nausea and vomiting. Negative for blood in stool and diarrhea.  Genitourinary:  Negative for difficulty urinating, flank  pain, frequency and hematuria.  Musculoskeletal:  Negative for arthralgias and back pain.  Skin:  Negative for rash.  Neurological:  Negative for dizziness, speech difficulty, weakness, numbness and headaches.    Physical Exam Updated Vital Signs BP 134/81   Pulse 100   Temp 98.3 F (36.8 C) (Oral)   Resp (!) 24   Ht 5\' 7"  (1.702 m)   Wt 73.5 kg   SpO2 98%   BMI 25.37 kg/m  Physical Exam Constitutional:      General: She is in acute distress.     Appearance: She is well-developed.     Comments: Uncomfortable appearing  HENT:     Head: Normocephalic and atraumatic.  Eyes:     Pupils: Pupils are equal, round, and reactive to light.  Cardiovascular:     Rate and Rhythm: Normal rate and regular rhythm.     Heart sounds: Normal heart sounds.  Pulmonary:     Effort: Pulmonary effort is normal. No respiratory distress.     Breath sounds: Normal breath sounds. No wheezing or rales.  Chest:     Chest wall: No tenderness.  Abdominal:     General: Bowel sounds are normal.     Palpations: Abdomen is soft.     Tenderness: There is generalized abdominal tenderness. There is no guarding or rebound.  Musculoskeletal:        General: Normal range of motion.     Cervical back: Normal range of motion and neck supple.  Lymphadenopathy:     Cervical: No cervical adenopathy.  Skin:    General: Skin is warm and dry.     Findings: No rash.  Neurological:     Mental Status: She is alert and oriented to person, place, and time.     ED Results / Procedures / Treatments   Labs (all labs ordered are listed, but only abnormal results are displayed) Labs Reviewed  COMPREHENSIVE METABOLIC PANEL - Abnormal; Notable for the following components:      Result Value   CO2 16 (*)    Glucose, Bld 179 (*)    BUN 28 (*)    Creatinine, Ser 1.14 (*)    GFR, Estimated 46 (*)    Anion gap 17 (*)    All other components within normal limits  CBC WITH DIFFERENTIAL/PLATELET - Abnormal; Notable for  the following components:   MCHC 29.3 (*)    All other components within normal limits  URINALYSIS, W/ REFLEX TO CULTURE (INFECTION SUSPECTED)  I-STAT CG4 LACTIC ACID, ED  I-STAT CHEM 8, ED  I-STAT CG4 LACTIC ACID, ED    EKG None  Radiology CT ABDOMEN PELVIS WO CONTRAST  Result Date: 12/08/2022 CLINICAL DATA:  Bowel obstruction suspected EXAM: CT ABDOMEN AND PELVIS WITHOUT CONTRAST TECHNIQUE: Multidetector CT imaging of the abdomen and pelvis was performed following the standard protocol without IV contrast. RADIATION DOSE REDUCTION: This exam was performed according  to the departmental dose-optimization program which includes automated exposure control, adjustment of the mA and/or kV according to patient size and/or use of iterative reconstruction technique. COMPARISON:  CT abdomen pelvis 11/04/2022 FINDINGS: Lower chest: Coronary artery calcification.  Small hiatal hernia. Hepatobiliary: No focal liver abnormality. Status post cholecystectomy. No biliary dilatation. Pancreas: Diffusely atrophic. No focal lesion. Otherwise normal pancreatic contour. No surrounding inflammatory changes. No main pancreatic ductal dilatation. Spleen: Normal in size without focal abnormality. Adrenals/Urinary Tract: No adrenal nodule bilaterally. No nephrolithiasis and no hydronephrosis. No definite contour-deforming renal mass. No ureterolithiasis or hydroureter. The urinary bladder is unremarkable. Stomach/Bowel: Stomach is within normal limits. Interval worsening of short segment small bowel within the mid left abdomen dilatation with fluid and fecalized material measuring up to 3.5 cm in caliber. Suggestion of a transition point within the left lower abdomen (2:48, 5:63). No pneumatosis. No evidence of large bowel wall thickening or dilatation. The appendix is not definitely identified with no inflammatory changes in the right lower quadrant to suggest acute appendicitis. Vascular/Lymphatic: Inferior vena cava filter  in grossly appropriate position. No abdominal aorta or iliac aneurysm. Severe atherosclerotic plaque of the aorta and its branches. No abdominal, pelvic, or inguinal lymphadenopathy. Reproductive: Status post hysterectomy. No adnexal masses. Other: No intraperitoneal free fluid. No intraperitoneal free gas. No organized fluid collection. Musculoskeletal: No abdominal wall hernia or abnormality. Calcified injection granulomas within the gluteal soft tissues. No suspicious lytic or blastic osseous lesions. No acute displaced fracture. Multilevel degenerative changes of the spine. IMPRESSION: 1. Interval worsening of short segment mid left abdomen small bowel dilatation with fluid and fecalized material measuring up to 3.5 cm in caliber.Suggestion of a transition point within the left lower abdomen; however, limited evaluation on this noncontrast study. Finding suggestive of a small-bowel obstruction. 2. Small hiatal hernia. 3. Aortic Atherosclerosis (ICD10-I70.0). Electronically Signed   By: Tish Frederickson M.D.   On: 12/08/2022 23:39    Procedures Procedures    Medications Ordered in ED Medications  diatrizoate meglumine-sodium (GASTROGRAFIN) 66-10 % solution 90 mL (has no administration in time range)  fentaNYL (SUBLIMAZE) injection 50 mcg (50 mcg Intravenous Given 12/08/22 2201)  ondansetron (ZOFRAN) injection 4 mg (4 mg Intravenous Given 12/08/22 2201)  fentaNYL (SUBLIMAZE) injection 50 mcg (50 mcg Intravenous Given 12/08/22 2335)    ED Course/ Medical Decision Making/ A&P                                 Medical Decision Making Amount and/or Complexity of Data Reviewed Labs: ordered. Radiology: ordered.  Risk Prescription drug management. Decision regarding hospitalization.   Patient is a 89 year old female who presents with abdominal pain.  She has had 2 recent small bowel obstructions.  CT scan today shows evidence of a recurrent small bowel obstruction.  Labs reviewed and are  nonconcerning.  NG tube has been ordered.  I did speak with the general surgeon on-call, Dr. Dossie Der, who will consult the patient in the morning.  Will consult the hospitalist for admission. Discussed with Dr. Margo Aye.  Final Clinical Impression(s) / ED Diagnoses Final diagnoses:  SBO (small bowel obstruction) (HCC)    Rx / DC Orders ED Discharge Orders     None         Rolan Bucco, MD 12/09/22 0004

## 2022-12-08 NOTE — Consult Note (Incomplete)
Consulting Physician: Hyman Hopes Lorry Anastasi  Referring Provider: Dr. Fredderick Phenix  Chief Complaint: Abdominal pain  Reason for Consult: Bowel obstruction   Subjective   HPI: Katherine [name] is an 89 y.o. female who is here for abdominal pain.  She was admitted with a bowel obstruction on CT in January and September of this year, symptoms improved with NG decompression and enteral contrast protocol each time.  She has had multiple previous abdominal surgeries related to bowel obstruction, but she does not recall the details.  Had severe cramping abdominal pain.  Similar to previous episodes.  Started last night.  Can't have bowel movement.   Past Medical History:  Diagnosis Date   Anemia    mild   Anginal pain (HCC)    Aortic stenosis    Atrial fibrillation (HCC)    Bowel obstruction (HCC) 2015   PSBO   Bowel obstruction (HCC) 10/2017   CAD (coronary artery disease)    Carotid artery stenosis    CHF (congestive heart failure) (HCC)    Chronic kidney disease    renal insufficiency - DR Lowell Guitar yearly   Diabetes mellitus without complication (HCC)    type 2   Full dentures    History of hiatal hernia    Hypertension    Mobility impaired    uses walker for ambulation   Stroke (HCC)    right sided weakness,walker   Wears glasses     Past Surgical History:  Procedure Laterality Date   ABDOMINAL HYSTERECTOMY     angiodysplasia     APPENDECTOMY     BREAST DUCTAL SYSTEM EXCISION  03/02/2012   Procedure: EXCISION DUCTAL SYSTEM BREAST;  Surgeon: Ernestene Mention, MD;  Location: [facility];  Service: General;  Laterality: Left;   BREAST EXCISIONAL BIOPSY     BREAST SURGERY     bx rt and lt    CATARACT EXTRACTION     left   CHOLECYSTECTOMY     COLONOSCOPY N/A 10/15/2012   Procedure: COLONOSCOPY;  Surgeon: Theda Belfast, MD;  Location: [facility];  Service: Endoscopy;  Laterality: N/A;   CYSTOSCOPY WITH BIOPSY N/A 01/24/2020   Procedure: CYSTOSCOPY WITH BLADDER  BIOPSY AND FULGERATION;  Surgeon: Noel Christmas, MD;  Location: [facility];  Service: Urology;  Laterality: N/A;   DILATION AND CURETTAGE OF UTERUS     DVT     ENTEROSCOPY N/A 11/17/2014   Procedure: ENTEROSCOPY;  Surgeon: Jeani Hawking, MD;  Location: [facility];  Service: Endoscopy;  Laterality: N/A;   ESOPHAGOGASTRODUODENOSCOPY N/A 09/10/2012   Procedure: ESOPHAGOGASTRODUODENOSCOPY (EGD);  Surgeon: Theda Belfast, MD;  Location: [facility];  Service: Endoscopy;  Laterality: N/A;   EYE SURGERY     right-glaucoma   HOT HEMOSTASIS N/A 09/10/2012   Procedure: HOT HEMOSTASIS (ARGON PLASMA COAGULATION/BICAP);  Surgeon: Theda Belfast, MD;  Location: [facility];  Service: Endoscopy;  Laterality: N/A;   HOT HEMOSTASIS N/A 11/17/2014   Procedure: HOT HEMOSTASIS (ARGON PLASMA COAGULATION/BICAP);  Surgeon: Jeani Hawking, MD;  Location: [facility];  Service: Endoscopy;  Laterality: N/A;   TUBAL LIGATION      Family History  Problem Relation Age of Onset   Heart disease Sister    Heart disease Brother    Heart disease Mother    Heart disease Father    Heart disease Daughter     Social:  reports that she quit smoking about 20 years ago. She has never used smokeless tobacco. She reports that she does  not drink alcohol and does not use drugs.  Allergies:  Allergies  Allergen Reactions   Asa [Aspirin] Hives   Blue Dyes (Parenteral) Hives   Motrin [Ibuprofen] Hives   Contrast Media [Iodinated Contrast Media] Other (See Comments)    Unknown reaction   Iohexol Other (See Comments)    Unknown reaction   Shellfish Allergy Hives    Medications: Current Outpatient Medications  Medication Instructions   allopurinol (ZYLOPRIM) 100 mg, Oral, Every morning   amLODipine (NORVASC) 5 mg, Oral, Daily   atorvastatin (LIPITOR) 40 mg, Oral, Every morning   brimonidine (ALPHAGAN P) 0.1 % SOLN 1 drop, Both Eyes, Every morning   clopidogrel (PLAVIX) 75 mg, Oral, Daily   dicyclomine (BENTYL) 10 mg,  Oral, 2 times daily PRN   dorzolamide (TRUSOPT) 2 % ophthalmic solution 1 drop, Both Eyes, Every morning   ergocalciferol (VITAMIN D2) 50,000 Units, Oral, Every Sat   estradiol (ESTRACE) 0.1 MG/GM vaginal cream 1 Applicatorful, Vaginal, Every other day, APPLY A FINGERTIP LENGTH AMOUNT AROUND THE URETHRA EVERY OTHER NIGHT. ENSURE TO RUB IN.   fentaNYL (DURAGESIC) 12 MCG/HR 1 patch, Transdermal, every 72 hours   losartan (COZAAR) 25 mg, Oral, Daily   Netarsudil-Latanoprost (ROCKLATAN) 0.02-0.005 % SOLN 1 drop, Both Eyes, Daily at bedtime   nitrofurantoin (macrocrystal-monohydrate) (MACROBID) 100 mg, Oral, 2 times daily   nitroGLYCERIN (NITRODUR - DOSED IN MG/24 HR) 0.4 mg, Transdermal, Every morning   omeprazole (PRILOSEC) 40 mg, Oral, Daily   ondansetron (ZOFRAN) 4 mg, Oral, Every 6 hours PRN   phenazopyridine (PYRIDIUM) 200 mg, Oral, 3 times daily PRN   torsemide (DEMADEX) 20 mg, Oral, Daily    ROS - all of the below systems have been reviewed with the patient and positives are indicated with bold text General: chills, fever or night sweats Eyes: blurry vision or double vision ENT: epistaxis or sore throat Allergy/Immunology: itchy/watery eyes or nasal congestion Hematologic/Lymphatic: bleeding problems, blood clots or swollen lymph nodes Endocrine: temperature intolerance or unexpected weight changes Breast: new or changing breast lumps or nipple discharge Resp: cough, shortness of breath, or wheezing CV: chest pain or dyspnea on exertion GI: as per HPI GU: dysuria, trouble voiding, or hematuria MSK: joint pain or joint stiffness Neuro: TIA or stroke symptoms Derm: pruritus and skin lesion changes Psych: anxiety and depression  Objective   PE Blood pressure 134/81, pulse 100, temperature 98.3 F (36.8 C), temperature source Oral, resp. rate (!) 24, height 5\' 7"  (1.702 m), weight 73.5 kg, SpO2 98%. Constitutional: NAD; conversant; no deformities Eyes: Moist conjunctiva; no lid  lag; anicteric; PERRL Neck: Trachea midline; no thyromegaly Lungs: Normal respiratory effort; no tactile fremitus CV: RRR; no palpable thrills; no pitting edema GI: Abd soft, tender to palpation diffusely; no palpable hepatosplenomegaly MSK: Normal range of motion of extremities; no clubbing/cyanosis Psychiatric: Appropriate affect; alert and oriented x3 Lymphatic: No palpable cervical or axillary lymphadenopathy  Results for orders placed or performed during the hospital encounter of 12/08/22 (from the past 24 hour(s))  Comprehensive metabolic panel     Status: Abnormal   Collection Time: 12/08/22 10:41 PM  Result Value Ref Range   Sodium 140 135 - 145 mmol/L   Potassium 3.6 3.5 - 5.1 mmol/L   Chloride 107 98 - 111 mmol/L   CO2 16 (L) 22 - 32 mmol/L   Glucose, Bld 179 (H) 70 - 99 mg/dL   BUN 28 (H) 8 - 23 mg/dL   Creatinine, Ser 1.61 (H) 0.44 - 1.00 mg/dL  Calcium 9.5 8.9 - 10.3 mg/dL   Total Protein 7.0 6.5 - 8.1 g/dL   Albumin 3.8 3.5 - 5.0 g/dL   AST 15 15 - 41 U/L   ALT 10 0 - 44 U/L   Alkaline Phosphatase 81 38 - 126 U/L   Total Bilirubin 0.5 0.3 - 1.2 mg/dL   GFR, Estimated 46 (L) >60 mL/min   Anion gap 17 (H) 5 - 15  CBC with Differential     Status: Abnormal   Collection Time: 12/08/22 10:41 PM  Result Value Ref Range   WBC 8.2 4.0 - 10.5 K/uL   RBC 4.36 3.87 - 5.11 MIL/uL   Hemoglobin 12.3 12.0 - 15.0 g/dL   HCT 29.5 62.1 - 30.8 %   MCV 96.3 80.0 - 100.0 fL   MCH 28.2 26.0 - 34.0 pg   MCHC 29.3 (L) 30.0 - 36.0 g/dL   RDW 65.7 84.6 - 96.2 %   Platelets 151 150 - 400 K/uL   nRBC 0.0 0.0 - 0.2 %   Neutrophils Relative % 87 %   Neutro Abs 7.1 1.7 - 7.7 K/uL   Lymphocytes Relative 9 %   Lymphs Abs 0.8 0.7 - 4.0 K/uL   Monocytes Relative 3 %   Monocytes Absolute 0.3 0.1 - 1.0 K/uL   Eosinophils Relative 0 %   Eosinophils Absolute 0.0 0.0 - 0.5 K/uL   Basophils Relative 0 %   Basophils Absolute 0.0 0.0 - 0.1 K/uL   Immature Granulocytes 1 %   Abs Immature  Granulocytes 0.04 0.00 - 0.07 K/uL  I-Stat Lactic Acid     Status: None   Collection Time: 12/08/22 10:48 PM  Result Value Ref Range   Lactic Acid, Venous 1.2 0.5 - 1.9 mmol/L     Imaging Orders         CT ABDOMEN PELVIS WO CONTRAST         DG Abd Portable 1V-Small Bowel Protocol-Position Verification         DG Abd Portable 1V-Small Bowel Obstruction Protocol-initial, 8 hr delay         DG Abd Portable 1V-Small Bowel Obstruction Protocol-24 hr delay      Assessment and Plan   CLAUDEEN LEASON is an 89 y.o. female who takes Plavix, who presented with abdominal pain, found on CT to have recurrent findings of bowel obstruction.  I recommended the small bowel obstruction protocol with NG decompression, contrast administration and follow up x-rays to further evaluate.  She has had multiple recurrent obstructions which may push her towards considering surgery, but she also has multiple medical issues and is 7, so continued conservative treatment may make sense.      ICD-10-CM   1. Small bowel obstruction (HCC)  K56.609 DG Abd Portable 1V-Small Bowel Obstruction Protocol-initial, 8 hr delay    DG Abd Portable 1V-Small Bowel Obstruction Protocol-initial, 8 hr delay       Quentin Ore, MD  [facility] Use [email or web address] to contact on call provider  New Patient Billing: [id number] - High MDM  I reviewed the CT scan from today which showed fecalized, dilated small intestine with a transition point consistent with bowel obstruction.  I reviewed her CT from September and January which looked similar.  I reviewed her labs (baseline) and vitals (stable).  I discussed the case with Dr. Fredderick Phenix.

## 2022-12-09 ENCOUNTER — Inpatient Hospital Stay (HOSPITAL_COMMUNITY): Payer: Medicare HMO

## 2022-12-09 DIAGNOSIS — K56609 Unspecified intestinal obstruction, unspecified as to partial versus complete obstruction: Secondary | ICD-10-CM | POA: Diagnosis not present

## 2022-12-09 LAB — GLUCOSE, CAPILLARY
Glucose-Capillary: 117 mg/dL — ABNORMAL HIGH (ref 70–99)
Glucose-Capillary: 109 mg/dL — ABNORMAL HIGH (ref 70–99)
Glucose-Capillary: 108 mg/dL — ABNORMAL HIGH (ref 70–99)

## 2022-12-09 LAB — BASIC METABOLIC PANEL
Anion gap: 17 — ABNORMAL HIGH (ref 5–15)
BUN: 25 mg/dL — ABNORMAL HIGH (ref 8–23)
CO2: 21 mmol/L — ABNORMAL LOW (ref 22–32)
Calcium: 9.7 mg/dL (ref 8.9–10.3)
Chloride: 102 mmol/L (ref 98–111)
Creatinine, Ser: 1.04 mg/dL — ABNORMAL HIGH (ref 0.44–1.00)
GFR, Estimated: 51 mL/min — ABNORMAL LOW (ref >60)
Glucose, Bld: 140 mg/dL — ABNORMAL HIGH (ref 70–99)
Potassium: 3.8 mmol/L (ref 3.5–5.1)
Sodium: 140 mmol/L (ref 135–145)

## 2022-12-09 LAB — CBC
HCT: 39.5 % (ref 36.0–46.0)
Hemoglobin: 12.8 g/dL (ref 12.0–15.0)
MCH: 28.1 pg (ref 26.0–34.0)
MCHC: 32.4 g/dL (ref 30.0–36.0)
MCV: 86.8 fL (ref 80.0–100.0)
Platelets: 226 10*3/uL (ref 150–400)
RBC: 4.55 MIL/uL (ref 3.87–5.11)
RDW: 14.6 % (ref 11.5–15.5)
WBC: 6.5 10*3/uL (ref 4.0–10.5)
nRBC: 0 % (ref 0.0–0.2)

## 2022-12-09 LAB — CBG MONITORING, ED
Glucose-Capillary: 127 mg/dL — ABNORMAL HIGH (ref 70–99)
Glucose-Capillary: 145 mg/dL — ABNORMAL HIGH (ref 70–99)

## 2022-12-09 LAB — PHOSPHORUS: Phosphorus: 3.4 mg/dL (ref 2.5–4.6)

## 2022-12-09 LAB — MAGNESIUM: Magnesium: 1.6 mg/dL — ABNORMAL LOW (ref 1.7–2.4)

## 2022-12-09 MED ORDER — DORZOLAMIDE HCL 2 % OP SOLN
1.0000 [drp] | Freq: Every morning | OPHTHALMIC | Status: DC
  Administered 2022-12-10 – 2022-12-13 (×4): 1 [drp] via OPHTHALMIC
  Filled 2022-12-09: qty 10

## 2022-12-09 MED ORDER — MORPHINE SULFATE (PF) 2 MG/ML IV SOLN
2.0000 mg | INTRAVENOUS | Status: DC | PRN
  Administered 2022-12-09 – 2022-12-13 (×4): 2 mg via INTRAVENOUS
  Filled 2022-12-09 (×4): qty 1

## 2022-12-09 MED ORDER — BRIMONIDINE TARTRATE 0.15 % OP SOLN
1.0000 [drp] | Freq: Three times a day (TID) | OPHTHALMIC | Status: DC
Start: 2022-12-09 — End: 2022-12-14
  Administered 2022-12-09 – 2022-12-13 (×12): 1 [drp] via OPHTHALMIC
  Filled 2022-12-09 (×2): qty 5

## 2022-12-09 MED ORDER — FENTANYL 12 MCG/HR TD PT72
1.0000 | MEDICATED_PATCH | TRANSDERMAL | Status: DC
  Administered 2022-12-09 – 2022-12-12 (×2): 1 via TRANSDERMAL
  Filled 2022-12-09 (×2): qty 1

## 2022-12-09 MED ORDER — NETARSUDIL-LATANOPROST 0.02-0.005 % OP SOLN: 1.0000 [drp] | Freq: Every day | OPHTHALMIC | Status: DC

## 2022-12-09 MED ORDER — INSULIN ASPART 100 UNIT/ML IJ SOLN
0.0000 [IU] | INTRAMUSCULAR | Status: DC
  Administered 2022-12-09: 1 [IU] via SUBCUTANEOUS
  Administered 2022-12-10: 2 [IU] via SUBCUTANEOUS
  Administered 2022-12-10: 3 [IU] via SUBCUTANEOUS

## 2022-12-09 MED ORDER — PROCHLORPERAZINE EDISYLATE 10 MG/2ML IJ SOLN
5.0000 mg | Freq: Four times a day (QID) | INTRAMUSCULAR | Status: DC | PRN
  Administered 2022-12-10 – 2022-12-13 (×2): 5 mg via INTRAVENOUS
  Filled 2022-12-09 (×2): qty 2

## 2022-12-09 MED ORDER — MAGNESIUM SULFATE 2 GM/50ML IV SOLN
2.0000 g | Freq: Once | INTRAVENOUS | Status: AC
  Administered 2022-12-09: 2 g via INTRAVENOUS
  Filled 2022-12-09: qty 50

## 2022-12-09 MED ORDER — HYDRALAZINE HCL 20 MG/ML IJ SOLN: 5.0000 mg | Freq: Four times a day (QID) | INTRAMUSCULAR | Status: DC | PRN

## 2022-12-09 NOTE — Plan of Care (Signed)
Patient alert/oriented X4 on admission. Patient compliant with medication administration and remains NPO due to possible interventional surgery. Patient has NG tube intact in L nare set up at low intermittent suction. Morphine administered as needed for severe abdominal pain. Patient tolerating cont. LR w/ potassium fluids. VSS, will continue to monitor.   Problem: Education: Goal: Ability to describe self-care measures that may prevent or decrease complications (Diabetes Survival Skills Education) will improve Outcome: Progressing   Problem: Education: Goal: Individualized Educational Video(s) Outcome: Progressing   Problem: Coping: Goal: Ability to adjust to condition or change in health will improve Outcome: Progressing   Problem: Fluid Volume: Goal: Ability to maintain a balanced intake and output will improve Outcome: Progressing   Problem: Health Behavior/Discharge Planning: Goal: Ability to identify and utilize available resources and services will improve Outcome: Progressing   Problem: Health Behavior/Discharge Planning: Goal: Ability to manage health-related needs will improve Outcome: Progressing   Problem: Metabolic: Goal: Ability to maintain appropriate glucose levels will improve Outcome: Progressing   Problem: Nutritional: Goal: Maintenance of adequate nutrition will improve Outcome: Progressing   Problem: Nutritional: Goal: Progress toward achieving an optimal weight will improve Outcome: Progressing   Problem: Skin Integrity: Goal: Risk for impaired skin integrity will decrease Outcome: Progressing   Problem: Tissue Perfusion: Goal: Adequacy of tissue perfusion will improve Outcome: Progressing

## 2022-12-09 NOTE — Evaluation (Signed)
Physical Therapy Evaluation Patient Details Name: Katherine [name] MRN: [id number] DOB: Jul 10, 1930 Today's Date: 12/09/2022  History of Present Illness  Patient is an 89 y/o female admitted 12/08/22 with abdominal pain with N&V with history of similar symptoms with SBO.  NG tube placed for decompression.  PMH positive for anemia, SBO, CHF, CKD, DM2, HH, ,HTN, PAF, stroke with R weakness, hysterectomy, appendectomy, cholecystecomy, and breast surgery.  Clinical Impression  Patient presents with decreased mobility due to pain in abdomen, weakness, decreased balance, decreased activity tolerance and currently high fall risk.  She normally has assistance during the day from her aide, though manages to get up and walk to the bathroom on her own and get meals her aide sets up for her at home on her own.  She has a life alert button and has had 2 falls in the past 6 months.  Patient needing min A for mobility up to bathroom today using RW.  She may progress and improve to go home if SBO resolves, currently would need inpatient rehab (<3 hours /day) at d/c.  PT will continue to follow.         If plan is discharge home, recommend the following: A little help with walking and/or transfers;Assist for transportation;Help with stairs or ramp for entrance;A little help with bathing/dressing/bathroom;Assistance with cooking/housework   Can travel by private vehicle   Yes    Equipment Recommendations None recommended by PT  Recommendations for Other Services       Functional Status Assessment Patient has had a recent decline in their functional status and demonstrates the ability to make significant improvements in function in a reasonable and predictable amount of time.     Precautions / Restrictions Precautions Precautions: Fall Precaution Comments: NGT to IWS      Mobility  Bed Mobility Overal bed mobility: Needs Assistance Bed Mobility: Supine to Sit, Sit to Supine     Supine to sit: Min  assist, HOB elevated, Used rails Sit to supine: Min assist   General bed mobility comments: assist to scoot to EOB to come upright and to supine assist for positioning    Transfers Overall transfer level: Needs assistance Equipment used: Rolling walker (2 wheels) Transfers: Sit to/from Stand Sit to Stand: Min assist           General transfer comment: up to stand with IV and assist for balance    Ambulation/Gait Ambulation/Gait assistance: Min assist Gait Distance (Feet): 15 Feet (x 2) Assistive device: Rolling walker (2 wheels) Gait Pattern/deviations: Step-through pattern, Decreased stride length, Trunk flexed, Shuffle       General Gait Details: flexed posture with pt reporting more pain with mobility, assist for balance, walker management and safety with lines  Stairs            Wheelchair Mobility     Tilt Bed    Modified Rankin (Stroke Patients Only)       Balance Overall balance assessment: Needs assistance Sitting-balance support: Feet supported Sitting balance-Leahy Scale: Good Sitting balance - Comments: toileted in bathroom on BSC over toilet   Standing balance support: Bilateral upper extremity supported, Reliant on assistive device for balance Standing balance-Leahy Scale: Poor Standing balance comment: leaning on walker throughout                             Pertinent Vitals/Pain Pain Assessment Pain Assessment: Faces Faces Pain Scale: Hurts little more Pain Location: abdomen Pain  Descriptors / Indicators: Discomfort, Grimacing, Guarding Pain Intervention(s): Monitored during session, Limited activity within patient's tolerance    Home Living Family/patient expects to be discharged to:: Private residence Living Arrangements: Alone Available Help at Discharge: Available PRN/intermittently;Personal care attendant (aide comes daily several hours a day) Type of Home: Apartment Home Access: Level entry       Home Layout:  One level Home Equipment: Grab bars - tub/shower;Hand held shower head;Tub bench;Rolling Walker (2 wheels);Cane - single point;Wheelchair - manual      Prior Function Prior Level of Function : Needs assist             Mobility Comments: uses walker or cane mainly ADLs Comments: help for shower, groceries, meals, dressing LB, bathing LB, picking up medications     Extremity/Trunk Assessment        Lower Extremity Assessment Lower Extremity Assessment: Generalized weakness    Cervical / Trunk Assessment Cervical / Trunk Assessment: Kyphotic  Communication   Communication Communication: Difficulty communicating thoughts/reduced clarity of speech  Cognition Arousal: Alert Behavior During Therapy: WFL for tasks assessed/performed Overall Cognitive Status: History of cognitive impairments - at baseline                                 General Comments: somewhat difficult to test due to aphasia; sister present and answering for pt at times        General Comments General comments (skin integrity, edema, etc.): sister present reporting heard pt may go to surgery, but no notes about it so RN contacting MD.  Patient soiled with urine in bed so changed linens and pt toileted in bathroom, RN aware and attempting to obtain pads for pt to wear, normally wears depends at home    Exercises     Assessment/Plan    PT Assessment Patient needs continued PT services  PT Problem List Decreased strength;Decreased balance;Pain;Decreased mobility;Decreased activity tolerance;Decreased knowledge of use of DME       PT Treatment Interventions DME instruction;Functional mobility training;Balance training;Patient/family education;Therapeutic activities;Gait training;Therapeutic exercise    PT Goals (Current goals can be found in the Care Plan section)  Acute Rehab PT Goals Patient Stated Goal: get issue resolved, go home PT Goal Formulation: With patient/family Time For Goal  Achievement: 12/23/22 Potential to Achieve Goals: Good    Frequency Min 1X/week     Co-evaluation               AM-PAC PT "6 Clicks" Mobility  Outcome Measure Help needed turning from your back to your side while in a flat bed without using bedrails?: A Little Help needed moving from lying on your back to sitting on the side of a flat bed without using bedrails?: A Little Help needed moving to and from a bed to a chair (including a wheelchair)?: A Little Help needed standing up from a chair using your arms (e.g., wheelchair or bedside chair)?: A Little Help needed to walk in hospital room?: A Little Help needed climbing 3-5 steps with a railing? : Total 6 Click Score: 16    End of Session Equipment Utilized During Treatment: Gait belt Activity Tolerance: Patient limited by pain;Patient limited by fatigue Patient left: in bed;with call bell/phone within reach;with family/visitor present   PT Visit Diagnosis: Other abnormalities of gait and mobility (R26.89);Muscle weakness (generalized) (M62.81)    Time: 3474-2595 PT Time Calculation (min) (ACUTE ONLY): 38 min   Charges:  PT Evaluation $PT Eval Moderate Complexity: 1 Mod PT Treatments $Gait Training: 8-22 mins PT General Charges $$ ACUTE PT VISIT: 1 Visit         Sheran Lawless, PT Acute Rehabilitation Services Office:[phone] 12/09/2022   Elray Mcgregor 12/09/2022, 5:36 PM

## 2022-12-09 NOTE — Progress Notes (Addendum)
Patient was admitted earlier this morning.  H&P reviewed.  Patient seen and examined.  Patient complains of abdominal pain.  Not much improvement noted after the NG tube was placed.  Vital signs reviewed.  Lungs are clear to auscultation S1-S2 is normal regular. Abdomen is soft, mildly distended.  Vague tenderness is present without any rebound rigidity or guarding.  Bowel sounds are sluggish.  No obvious focal neurological deficits.  Labs reviewed.  Renal function is stable.  Magnesium noted to be 1.6.  Potassium 3.8.  Patient admitted with small bowel obstruction.  General surgery is following and managing.  Will replace magnesium.  Continue with IV fluids.  Recheck labs tomorrow.  Home med list shows that she is supposed to be on a fentanyl patch every 3 days.  Prescriber database was reviewed and verified.  Will resume fentanyl patch.  Resume her eyedrops as well.  Other issues as per H&P.

## 2022-12-09 NOTE — ED Notes (Signed)
ED TO INPATIENT HANDOFF REPORT  ED Nurse Name and Phone #: Vernona Rieger [phone]  S Name/Age/Gender Katherine [name] 89 y.o. female Room/Bed: 046C/046C  Code Status   Code Status: Full Code  Home/SNF/Other Home Patient oriented to: self, place, time, and situation Is this baseline? Yes   Triage Complete: Triage complete  Chief Complaint SBO (small bowel obstruction) (HCC) [K56.609]  Triage Note Patient BIB EMS for evaluation of abdominal pain.  Hx of recent SOB with treatment 3 weeks ago.  Reports no BM x 3 days.  Vomited yesterday.  Per EMS, abdomen soft with positive bowel sounds.  States symptoms similar to recent SBO   Allergies Allergies  Allergen Reactions   Asa [Aspirin] Hives   Blue Dyes (Parenteral) Hives   Motrin [Ibuprofen] Hives   Contrast Media [Iodinated Contrast Media] Other (See Comments)    Unknown reaction   Iohexol Other (See Comments)    Unknown reaction   Shellfish Allergy Hives    Level of Care/Admitting Diagnosis ED Disposition     ED Disposition  Admit   Condition  --   Comment  Hospital Area: [facility] [[id number]]  Level of Care: Med-Surg [16]  May admit patient to [facility] or [facility] if equivalent level of care is available:: Yes  Covid Evaluation: Asymptomatic - no recent exposure (last 10 days) testing not required  Diagnosis: SBO (small bowel obstruction) [facility]) [[id number]]  Admitting Physician: Darlin Drop [[id number]]  Attending Physician: Darlin Drop [[id number]]  Certification:: I certify this patient will need inpatient services for at least 2 midnights  Expected Medical Readiness: 12/10/2022          B Medical/Surgery History Past Medical History:  Diagnosis Date   Anemia    mild   Anginal pain (HCC)    Aortic stenosis    Atrial fibrillation (HCC)    Bowel obstruction (HCC) 2015   PSBO   Bowel obstruction (HCC) 10/2017   CAD (coronary artery disease)    Carotid artery stenosis    CHF (congestive heart  failure) (HCC)    Chronic kidney disease    renal insufficiency - DR Lowell Guitar yearly   Diabetes mellitus without complication (HCC)    type 2   Full dentures    History of hiatal hernia    Hypertension    Mobility impaired    uses walker for ambulation   Stroke (HCC)    right sided weakness,walker   Wears glasses    Past Surgical History:  Procedure Laterality Date   ABDOMINAL HYSTERECTOMY     angiodysplasia     APPENDECTOMY     BREAST DUCTAL SYSTEM EXCISION  03/02/2012   Procedure: EXCISION DUCTAL SYSTEM BREAST;  Surgeon: Ernestene Mention, MD;  Location: [facility];  Service: General;  Laterality: Left;   BREAST EXCISIONAL BIOPSY     BREAST SURGERY     bx rt and lt    CATARACT EXTRACTION     left   CHOLECYSTECTOMY     COLONOSCOPY N/A 10/15/2012   Procedure: COLONOSCOPY;  Surgeon: Theda Belfast, MD;  Location: [facility];  Service: Endoscopy;  Laterality: N/A;   CYSTOSCOPY WITH BIOPSY N/A 01/24/2020   Procedure: CYSTOSCOPY WITH BLADDER BIOPSY AND FULGERATION;  Surgeon: Noel Christmas, MD;  Location: [facility];  Service: Urology;  Laterality: N/A;   DILATION AND CURETTAGE OF UTERUS     DVT     ENTEROSCOPY N/A 11/17/2014   Procedure: ENTEROSCOPY;  Surgeon: Luisa Hart  Elnoria Howard, MD;  Location: [facility];  Service: Endoscopy;  Laterality: N/A;   ESOPHAGOGASTRODUODENOSCOPY N/A 09/10/2012   Procedure: ESOPHAGOGASTRODUODENOSCOPY (EGD);  Surgeon: Theda Belfast, MD;  Location: [facility];  Service: Endoscopy;  Laterality: N/A;   EYE SURGERY     right-glaucoma   HOT HEMOSTASIS N/A 09/10/2012   Procedure: HOT HEMOSTASIS (ARGON PLASMA COAGULATION/BICAP);  Surgeon: Theda Belfast, MD;  Location: [facility];  Service: Endoscopy;  Laterality: N/A;   HOT HEMOSTASIS N/A 11/17/2014   Procedure: HOT HEMOSTASIS (ARGON PLASMA COAGULATION/BICAP);  Surgeon: Jeani Hawking, MD;  Location: [facility];  Service: Endoscopy;  Laterality: N/A;   TUBAL LIGATION       A IV  Location/Drains/Wounds Patient Lines/Drains/Airways Status     Active Line/Drains/Airways     Name Placement date Placement time Site Days   Peripheral IV 12/08/22 20 G Posterior;Right Hand 12/08/22  2156  Hand  1   NG/OG Vented/Dual Lumen 16 Fr. Left nare Marking at nare/corner of mouth 60 cm 12/09/22  0008  Left nare  less than 1   Wound / Incision (Open or Dehisced) 03/18/22 Skin tear Buttocks Left 03/18/22  1519  Buttocks  266            Intake/Output Last 24 hours  Intake/Output Summary (Last 24 hours) at 12/09/2022 1132 Last data filed at 12/09/2022 1130 Gross per 24 hour  Intake 731.72 ml  Output --  Net 731.72 ml    Labs/Imaging Results for orders placed or performed during the hospital encounter of 12/08/22 (from the past 48 hour(s))  Comprehensive metabolic panel     Status: Abnormal   Collection Time: 12/08/22 10:41 PM  Result Value Ref Range   Sodium 140 135 - 145 mmol/L   Potassium 3.6 3.5 - 5.1 mmol/L   Chloride 107 98 - 111 mmol/L   CO2 16 (L) 22 - 32 mmol/L   Glucose, Bld 179 (H) 70 - 99 mg/dL    Comment: Glucose reference range applies only to samples taken after fasting for at least 8 hours.   BUN 28 (H) 8 - 23 mg/dL   Creatinine, Ser 4.40 (H) 0.44 - 1.00 mg/dL   Calcium 9.5 8.9 - 10.2 mg/dL   Total Protein 7.0 6.5 - 8.1 g/dL   Albumin 3.8 3.5 - 5.0 g/dL   AST 15 15 - 41 U/L   ALT 10 0 - 44 U/L   Alkaline Phosphatase 81 38 - 126 U/L   Total Bilirubin 0.5 0.3 - 1.2 mg/dL   GFR, Estimated 46 (L) >60 mL/min    Comment: (NOTE) Calculated using the CKD-EPI Creatinine Equation (2021)    Anion gap 17 (H) 5 - 15    Comment: Performed at [facility], [address] [place], [place] [address]  CBC with Differential     Status: Abnormal   Collection Time: 12/08/22 10:41 PM  Result Value Ref Range   WBC 8.2 4.0 - 10.5 K/uL   RBC 4.36 3.87 - 5.11 MIL/uL   Hemoglobin 12.3 12.0 - 15.0 g/dL   HCT 64.4 03.4 - 74.2 %   MCV 96.3 80.0 - 100.0 fL   MCH  28.2 26.0 - 34.0 pg   MCHC 29.3 (L) 30.0 - 36.0 g/dL   RDW 59.5 63.8 - 75.6 %   Platelets 151 150 - 400 K/uL    Comment: REPEATED TO VERIFY   nRBC 0.0 0.0 - 0.2 %   Neutrophils Relative % 87 %   Neutro Abs 7.1  1.7 - 7.7 K/uL   Lymphocytes Relative 9 %   Lymphs Abs 0.8 0.7 - 4.0 K/uL   Monocytes Relative 3 %   Monocytes Absolute 0.3 0.1 - 1.0 K/uL   Eosinophils Relative 0 %   Eosinophils Absolute 0.0 0.0 - 0.5 K/uL   Basophils Relative 0 %   Basophils Absolute 0.0 0.0 - 0.1 K/uL   Immature Granulocytes 1 %   Abs Immature Granulocytes 0.04 0.00 - 0.07 K/uL    Comment: Performed at [facility], [address] [place], [place] [address]  I-Stat Lactic Acid     Status: None   Collection Time: 12/08/22 10:48 PM  Result Value Ref Range   Lactic Acid, Venous 1.2 0.5 - 1.9 mmol/L  CBG monitoring, ED     Status: Abnormal   Collection Time: 12/09/22 12:15 AM  Result Value Ref Range   Glucose-Capillary 145 (H) 70 - 99 mg/dL    Comment: Glucose reference range applies only to samples taken after fasting for at least 8 hours.  CBC     Status: None   Collection Time: 12/09/22  2:35 AM  Result Value Ref Range   WBC 6.5 4.0 - 10.5 K/uL   RBC 4.55 3.87 - 5.11 MIL/uL   Hemoglobin 12.8 12.0 - 15.0 g/dL   HCT 19.1 47.8 - 29.5 %   MCV 86.8 80.0 - 100.0 fL   MCH 28.1 26.0 - 34.0 pg   MCHC 32.4 30.0 - 36.0 g/dL   RDW 62.1 30.8 - 65.7 %   Platelets 226 150 - 400 K/uL   nRBC 0.0 0.0 - 0.2 %    Comment: Performed at [facility], [address] [place], [place] [address]  Basic metabolic panel     Status: Abnormal   Collection Time: 12/09/22  2:35 AM  Result Value Ref Range   Sodium 140 135 - 145 mmol/L   Potassium 3.8 3.5 - 5.1 mmol/L   Chloride 102 98 - 111 mmol/L   CO2 21 (L) 22 - 32 mmol/L   Glucose, Bld 140 (H) 70 - 99 mg/dL    Comment: Glucose reference range applies only to samples taken after fasting for at least 8 hours.   BUN 25 (H) 8 - 23 mg/dL   Creatinine, Ser  2.95 (H) 0.44 - 1.00 mg/dL   Calcium 9.7 8.9 - 28.4 mg/dL   GFR, Estimated 51 (L) >60 mL/min    Comment: (NOTE) Calculated using the CKD-EPI Creatinine Equation (2021)    Anion gap 17 (H) 5 - 15    Comment: Performed at [facility], [address], [place], [place] [address]  Magnesium     Status: Abnormal   Collection Time: 12/09/22  2:35 AM  Result Value Ref Range   Magnesium 1.6 (L) 1.7 - 2.4 mg/dL    Comment: Performed at [facility], [address] [place], [place] [address]  Phosphorus     Status: None   Collection Time: 12/09/22  2:35 AM  Result Value Ref Range   Phosphorus 3.4 2.5 - 4.6 mg/dL    Comment: Performed at [facility], [address], [place], [place] [address]  CBG monitoring, ED     Status: Abnormal   Collection Time: 12/09/22  9:09 AM  Result Value Ref Range   Glucose-Capillary 127 (H) 70 - 99 mg/dL    Comment: Glucose reference range applies only to samples taken after fasting for at least 8  hours.   Comment 1 Notify RN    Comment 2 Document in Chart    DG Abd Portable 1V-Small Bowel Obstruction Protocol-initial, 8 hr delay  Result Date: 12/09/2022 CLINICAL DATA:  Small-bowel obstruction.  8 hour delayed film. EXAM: PORTABLE ABDOMEN - 1 VIEW COMPARISON:  12/09/2022, 12:10 a.m. FINDINGS: The bowel gas pattern is non-obstructive. There is paucity of bowel gas. No evidence of pneumoperitoneum, within the limitations of a supine film. No acute osseous abnormalities. Redemonstration of extensive soft tissue calcifications overlying bilateral iliac wings. Surgical changes, devices, tubes and lines: IVC filter noted overlying the L2-3 vertebral bodies on the right. There are surgical clips in the right upper quadrant, typical of a previous cholecystectomy. Enteric tube is seen with its tip and side hole overlying the left upper quadrant, within the distal stomach. IMPRESSION: 1. Nonobstructive bowel gas pattern. Electronically Signed   By: Jules Schick M.D.   On: 12/09/2022 10:51   DG Abd Portable 1V-Small Bowel Protocol-Position Verification  Result Date: 12/09/2022 CLINICAL DATA:  NG placement. EXAM: PORTABLE ABDOMEN - 1 VIEW COMPARISON:  CT abdomen pelvis dated 12/08/2022. FINDINGS: Enteric tube with tip over the upper abdomen likely in the body of the stomach. Right upper quadrant cholecystectomy clips. No acute osseous pathology. IMPRESSION: Enteric tube with tip over the body of the stomach. Electronically Signed   By: Elgie Collard M.D.   On: 12/09/2022 00:51   CT ABDOMEN PELVIS WO CONTRAST  Result Date: 12/08/2022 CLINICAL DATA:  Bowel obstruction suspected EXAM: CT ABDOMEN AND PELVIS WITHOUT CONTRAST TECHNIQUE: Multidetector CT imaging of the abdomen and pelvis was performed following the standard protocol without IV contrast. RADIATION DOSE REDUCTION: This exam was performed according to the departmental dose-optimization program which includes automated exposure control, adjustment of the mA and/or kV according to patient size and/or use of iterative reconstruction technique. COMPARISON:  CT abdomen pelvis 11/04/2022 FINDINGS: Lower chest: Coronary artery calcification.  Small hiatal hernia. Hepatobiliary: No focal liver abnormality. Status post cholecystectomy. No biliary dilatation. Pancreas: Diffusely atrophic. No focal lesion. Otherwise normal pancreatic contour. No surrounding inflammatory changes. No main pancreatic ductal dilatation. Spleen: Normal in size without focal abnormality. Adrenals/Urinary Tract: No adrenal nodule bilaterally. No nephrolithiasis and no hydronephrosis. No definite contour-deforming renal mass. No ureterolithiasis or hydroureter. The urinary bladder is unremarkable. Stomach/Bowel: Stomach is within normal limits. Interval worsening of short segment small bowel within the mid left abdomen dilatation with fluid and fecalized material measuring up to 3.5 cm in caliber. Suggestion of a transition point  within the left lower abdomen (2:48, 5:63). No pneumatosis. No evidence of large bowel wall thickening or dilatation. The appendix is not definitely identified with no inflammatory changes in the right lower quadrant to suggest acute appendicitis. Vascular/Lymphatic: Inferior vena cava filter in grossly appropriate position. No abdominal aorta or iliac aneurysm. Severe atherosclerotic plaque of the aorta and its branches. No abdominal, pelvic, or inguinal lymphadenopathy. Reproductive: Status post hysterectomy. No adnexal masses. Other: No intraperitoneal free fluid. No intraperitoneal free gas. No organized fluid collection. Musculoskeletal: No abdominal wall hernia or abnormality. Calcified injection granulomas within the gluteal soft tissues. No suspicious lytic or blastic osseous lesions. No acute displaced fracture. Multilevel degenerative changes of the spine. IMPRESSION: 1. Interval worsening of short segment mid left abdomen small bowel dilatation with fluid and fecalized material measuring up to 3.5 cm in caliber.Suggestion of a transition point within the left lower abdomen; however, limited evaluation on this noncontrast study. Finding suggestive of a small-bowel obstruction.  2. Small hiatal hernia. 3. Aortic Atherosclerosis (ICD10-I70.0). Electronically Signed   By: Tish Frederickson M.D.   On: 12/08/2022 23:39    Pending Labs Unresulted Labs (From admission, onward)     Start     Ordered   12/10/22 0500  CBC  Tomorrow morning,   R        12/09/22 1055   12/10/22 0500  Basic metabolic panel  Tomorrow morning,   R        12/09/22 1055   12/10/22 0500  Magnesium  Tomorrow morning,   R        12/09/22 1055   12/08/22 2047  Urinalysis, w/ Reflex to Culture (Infection Suspected) -Urine, Clean Catch  Once,   URGENT       Question:  Specimen Source  Answer:  Urine, Clean Catch   12/08/22 2046            Vitals/Pain Today's Vitals   12/09/22 1030 12/09/22 1050 12/09/22 1051 12/09/22 1100   BP: 134/63   (!) 138/56  Pulse: 82   72  Resp: 16   14  Temp:   98.4 F (36.9 C)   TempSrc:   Oral   SpO2: 97%   96%  Weight:      Height:      PainSc:  6       Isolation Precautions No active isolations  Medications Medications  heparin injection 5,000 Units (has no administration in time range)  lactated ringers 1,000 mL with potassium chloride 10 mEq infusion ( Intravenous Infusion Verify 12/09/22 1051)  insulin aspart (novoLOG) injection 0-9 Units ( Subcutaneous Not Given 12/09/22 0933)  hydrALAZINE (APRESOLINE) injection 5 mg (has no administration in time range)  prochlorperazine (COMPAZINE) injection 5 mg (has no administration in time range)  morphine (PF) 2 MG/ML injection 2 mg (2 mg Intravenous Given 12/09/22 0544)  fentaNYL (SUBLIMAZE) injection 50 mcg (50 mcg Intravenous Given 12/08/22 2201)  ondansetron (ZOFRAN) injection 4 mg (4 mg Intravenous Given 12/08/22 2201)  fentaNYL (SUBLIMAZE) injection 50 mcg (50 mcg Intravenous Given 12/08/22 2335)  diatrizoate meglumine-sodium (GASTROGRAFIN) 66-10 % solution 90 mL (90 mLs Per NG tube Given 12/09/22 0154)  magnesium sulfate IVPB 2 g 50 mL (0 g Intravenous Stopped 12/09/22 1130)    Mobility walks with device     Focused Assessments Abd pain.    R Recommendations: See Admitting Provider Note  Report given to:   Additional Notes: Expressive aphasia at times from hx of stroke. NG tube in place.

## 2022-12-09 NOTE — ED Notes (Signed)
Claud Kelp made aware of gastrografin admin time.

## 2022-12-09 NOTE — H&P (Addendum)
History and Physical  Katherine [name] ZOX:[id number] DOB: Aug 13, 1930 DOA: 12/08/2022  Referring physician: Dr. Fredderick Phenix, EDP  PCP: Katherine Cloud, MD  Outpatient Specialists: Neurology, urology, Patient coming from: Home.  Chief Complaint: Abdominal pain, nausea and vomiting.  HPI: Katherine [name] is a 89 y.o. female with medical history significant for type 2 diabetes, hypertension, hyperlipidemia, paroxysmal A-fib not on oral anticoagulation, CVA on Plavix, coronary artery disease, chronic diastolic CHF, mild to moderate aortic stenosis, CKD stage IIIa, who presents from home with complaints of diffuse abdominal pain for the past 2 days.  Associated with nausea and vomiting.  No reported subjective fevers or chills.  Symptoms are similar to prior SBO.  Presented to the ED for further evaluation.  In the ED, vital signs are stable.  Noncontrast CT abdomen pelvis revealed findings suggestive of SBO and transition point within the left lower abdomen.  EDP discussed the case with general surgery.  NG tube was placed in the ED.  Admitted by [facility], hospitalist service.  ED Course: Temperature 98.3.  BP 152/65, pulse 66, respiration rate 20, O2 saturation 100% on room air.  Lab studies notable for serum bicarb of 16, glucose 179, BUN 28, creatinine 1.14 with GFR 46.  Review of Systems: Review of systems as noted in the HPI. All other systems reviewed and are negative.   Past Medical History:  Diagnosis Date   Anemia    mild   Anginal pain (HCC)    Aortic stenosis    Atrial fibrillation (HCC)    Bowel obstruction (HCC) 2015   PSBO   Bowel obstruction (HCC) 10/2017   CAD (coronary artery disease)    Carotid artery stenosis    CHF (congestive heart failure) (HCC)    Chronic kidney disease    renal insufficiency - DR Lowell Guitar yearly   Diabetes mellitus without complication (HCC)    type 2   Full dentures    History of hiatal hernia    Hypertension    Mobility impaired    uses walker for  ambulation   Stroke (HCC)    right sided weakness,walker   Wears glasses    Past Surgical History:  Procedure Laterality Date   ABDOMINAL HYSTERECTOMY     angiodysplasia     APPENDECTOMY     BREAST DUCTAL SYSTEM EXCISION  03/02/2012   Procedure: EXCISION DUCTAL SYSTEM BREAST;  Surgeon: Ernestene Mention, MD;  Location: [facility];  Service: General;  Laterality: Left;   BREAST EXCISIONAL BIOPSY     BREAST SURGERY     bx rt and lt    CATARACT EXTRACTION     left   CHOLECYSTECTOMY     COLONOSCOPY N/A 10/15/2012   Procedure: COLONOSCOPY;  Surgeon: Theda Belfast, MD;  Location: [facility];  Service: Endoscopy;  Laterality: N/A;   CYSTOSCOPY WITH BIOPSY N/A 01/24/2020   Procedure: CYSTOSCOPY WITH BLADDER BIOPSY AND FULGERATION;  Surgeon: Noel Christmas, MD;  Location: [facility];  Service: Urology;  Laterality: N/A;   DILATION AND CURETTAGE OF UTERUS     DVT     ENTEROSCOPY N/A 11/17/2014   Procedure: ENTEROSCOPY;  Surgeon: Jeani Hawking, MD;  Location: [facility];  Service: Endoscopy;  Laterality: N/A;   ESOPHAGOGASTRODUODENOSCOPY N/A 09/10/2012   Procedure: ESOPHAGOGASTRODUODENOSCOPY (EGD);  Surgeon: Theda Belfast, MD;  Location: [facility];  Service: Endoscopy;  Laterality: N/A;   EYE SURGERY     right-glaucoma   HOT HEMOSTASIS N/A 09/10/2012   Procedure: HOT HEMOSTASIS (  ARGON PLASMA COAGULATION/BICAP);  Surgeon: Theda Belfast, MD;  Location: [facility];  Service: Endoscopy;  Laterality: N/A;   HOT HEMOSTASIS N/A 11/17/2014   Procedure: HOT HEMOSTASIS (ARGON PLASMA COAGULATION/BICAP);  Surgeon: Jeani Hawking, MD;  Location: [facility];  Service: Endoscopy;  Laterality: N/A;   TUBAL LIGATION      Social History:  reports that she quit smoking about 20 years ago. She has never used smokeless tobacco. She reports that she does not drink alcohol and does not use drugs.   Allergies  Allergen Reactions   Asa [Aspirin] Hives   Blue Dyes (Parenteral) Hives    Motrin [Ibuprofen] Hives   Contrast Media [Iodinated Contrast Media] Other (See Comments)    Unknown reaction   Iohexol Other (See Comments)    Unknown reaction   Shellfish Allergy Hives    Family History  Problem Relation Age of Onset   Heart disease Sister    Heart disease Brother    Heart disease Mother    Heart disease Father    Heart disease Daughter       Prior to Admission medications   Medication Sig Start Date End Date Taking? Authorizing Provider  allopurinol (ZYLOPRIM) 100 MG tablet Take 100 mg by mouth in the morning. 12/31/21   [provider]  amLODipine (NORVASC) 5 MG tablet Take 5 mg by mouth daily. 09/06/22   [provider]  atorvastatin (LIPITOR) 40 MG tablet Take 40 mg by mouth in the morning. 02/22/22   [provider]  brimonidine (ALPHAGAN P) 0.1 % SOLN Place 1 drop into both eyes in the morning.    [provider]  clopidogrel (PLAVIX) 75 MG tablet Take 1 tablet (75 mg total) by mouth daily. Patient taking differently: Take 75 mg by mouth in the morning. 06/04/17   Katherine Cloud, MD  dicyclomine (BENTYL) 10 MG capsule Take 10 mg by mouth 2 (two) times daily as needed for spasms. 10/17/22   [provider]  dorzolamide (TRUSOPT) 2 % ophthalmic solution Place 1 drop into both eyes in the morning.    [provider]  ergocalciferol (VITAMIN D2) 50000 units capsule Take 50,000 Units by mouth every Saturday.     [provider]  estradiol (ESTRACE) 0.1 MG/GM vaginal cream Place 1 Applicatorful vaginally every other day. APPLY A FINGERTIP LENGTH AMOUNT AROUND THE URETHRA EVERY OTHER NIGHT. ENSURE TO RUB IN. 02/28/22   [provider]  fentaNYL (DURAGESIC) 12 MCG/HR Place 1 patch onto the skin every 3 (three) days. 08/19/22   [provider]  losartan (COZAAR) 25 MG tablet Take 25 mg by mouth daily. 10/12/22   [provider]  Netarsudil-Latanoprost (ROCKLATAN) 0.02-0.005 % SOLN Place  1 drop into both eyes at bedtime.    [provider]  nitrofurantoin, macrocrystal-monohydrate, (MACROBID) 100 MG capsule Take 100 mg by mouth 2 (two) times daily. 10/23/22   [provider]  nitroGLYCERIN (NITRODUR - DOSED IN MG/24 HR) 0.4 mg/hr patch Place 0.4 mg onto the skin in the morning.    [provider]  omeprazole (PRILOSEC) 40 MG capsule Take 1 capsule (40 mg total) by mouth daily. Patient taking differently: Take 40 mg by mouth in the morning. 01/25/20   Katherine Cloud, MD  ondansetron (ZOFRAN) 4 MG tablet Take 1 tablet (4 mg total) by mouth every 6 (six) hours as needed for nausea. 11/06/22   Joseph Art, DO  phenazopyridine (PYRIDIUM) 200 MG tablet Take 200 mg by mouth 3 (three)  times daily as needed for pain. 10/23/22   [provider]  torsemide (DEMADEX) 20 MG tablet Take 1 tablet (20 mg total) by mouth daily. 11/11/22   Joseph Art, DO    Physical Exam: BP 134/81   Pulse 100   Temp 98.3 F (36.8 C) (Oral)   Resp (!) 24   Ht 5\' 7"  (1.702 m)   Wt 73.5 kg   SpO2 98%   BMI 25.37 kg/m   General: 89 y.o. year-old female well developed well nourished in no acute distress.  Alert and oriented x3. Cardiovascular: Regular rate and rhythm with no rubs or gallops.  No thyromegaly or JVD noted.  No lower extremity edema. 2/4 pulses in all 4 extremities. Respiratory: Clear to auscultation with no wheezes or rales.  Poor inspiratory effort. Abdomen: Mildly distended with hypoactive bowel sounds.   Muskuloskeletal: No cyanosis, clubbing or edema noted bilaterally Neuro: CN II-XII intact, strength, sensation, reflexes Skin: No ulcerative lesions noted or rashes Psychiatry: Judgement and insight appear normal. Mood is appropriate for condition and setting          Labs on Admission:  Basic Metabolic Panel: Recent Labs  Lab 12/08/22 2241  NA 140  K 3.6  CL 107  CO2 16*  GLUCOSE 179*  BUN 28*  CREATININE 1.14*  CALCIUM 9.5   Liver  Function Tests: Recent Labs  Lab 12/08/22 2241  AST 15  ALT 10  ALKPHOS 81  BILITOT 0.5  PROT 7.0  ALBUMIN 3.8   No results for input(s): "LIPASE", "AMYLASE" in the last 168 hours. No results for input(s): "AMMONIA" in the last 168 hours. CBC: Recent Labs  Lab 12/08/22 2241  WBC 8.2  NEUTROABS 7.1  HGB 12.3  HCT 42.0  MCV 96.3  PLT 151   Cardiac Enzymes: No results for input(s): "CKTOTAL", "CKMB", "CKMBINDEX", "TROPONINI" in the last 168 hours.  BNP (last 3 results) No results for input(s): "BNP" in the last 8760 hours.  ProBNP (last 3 results) No results for input(s): "PROBNP" in the last 8760 hours.  CBG: Recent Labs  Lab 12/09/22 0015  GLUCAP 145*    Radiological Exams on Admission: DG Abd Portable 1V-Small Bowel Protocol-Position Verification  Result Date: 12/09/2022 CLINICAL DATA:  NG placement. EXAM: PORTABLE ABDOMEN - 1 VIEW COMPARISON:  CT abdomen pelvis dated 12/08/2022. FINDINGS: Enteric tube with tip over the upper abdomen likely in the body of the stomach. Right upper quadrant cholecystectomy clips. No acute osseous pathology. IMPRESSION: Enteric tube with tip over the body of the stomach. Electronically Signed   By: Elgie Collard M.D.   On: 12/09/2022 00:51   CT ABDOMEN PELVIS WO CONTRAST  Result Date: 12/08/2022 CLINICAL DATA:  Bowel obstruction suspected EXAM: CT ABDOMEN AND PELVIS WITHOUT CONTRAST TECHNIQUE: Multidetector CT imaging of the abdomen and pelvis was performed following the standard protocol without IV contrast. RADIATION DOSE REDUCTION: This exam was performed according to the departmental dose-optimization program which includes automated exposure control, adjustment of the mA and/or kV according to patient size and/or use of iterative reconstruction technique. COMPARISON:  CT abdomen pelvis 11/04/2022 FINDINGS: Lower chest: Coronary artery calcification.  Small hiatal hernia. Hepatobiliary: No focal liver abnormality. Status post  cholecystectomy. No biliary dilatation. Pancreas: Diffusely atrophic. No focal lesion. Otherwise normal pancreatic contour. No surrounding inflammatory changes. No main pancreatic ductal dilatation. Spleen: Normal in size without focal abnormality. Adrenals/Urinary Tract: No adrenal nodule bilaterally. No nephrolithiasis and no hydronephrosis. No definite contour-deforming renal mass. No ureterolithiasis or  hydroureter. The urinary bladder is unremarkable. Stomach/Bowel: Stomach is within normal limits. Interval worsening of short segment small bowel within the mid left abdomen dilatation with fluid and fecalized material measuring up to 3.5 cm in caliber. Suggestion of a transition point within the left lower abdomen (2:48, 5:63). No pneumatosis. No evidence of large bowel wall thickening or dilatation. The appendix is not definitely identified with no inflammatory changes in the right lower quadrant to suggest acute appendicitis. Vascular/Lymphatic: Inferior vena cava filter in grossly appropriate position. No abdominal aorta or iliac aneurysm. Severe atherosclerotic plaque of the aorta and its branches. No abdominal, pelvic, or inguinal lymphadenopathy. Reproductive: Status post hysterectomy. No adnexal masses. Other: No intraperitoneal free fluid. No intraperitoneal free gas. No organized fluid collection. Musculoskeletal: No abdominal wall hernia or abnormality. Calcified injection granulomas within the gluteal soft tissues. No suspicious lytic or blastic osseous lesions. No acute displaced fracture. Multilevel degenerative changes of the spine. IMPRESSION: 1. Interval worsening of short segment mid left abdomen small bowel dilatation with fluid and fecalized material measuring up to 3.5 cm in caliber.Suggestion of a transition point within the left lower abdomen; however, limited evaluation on this noncontrast study. Finding suggestive of a small-bowel obstruction. 2. Small hiatal hernia. 3. Aortic  Atherosclerosis (ICD10-I70.0). Electronically Signed   By: Tish Frederickson M.D.   On: 12/08/2022 23:39    EKG: I independently viewed the EKG done and my findings are as followed: None available at the time of this visit.  Assessment/Plan Present on Admission:  SBO (small bowel obstruction) (HCC)  Principal Problem:   SBO (small bowel obstruction) (HCC)  SBO, POA History of prior SBO x 2 Transition point within the left lower abdomen.  EDP discussed the case with general surgery.  NG tube was placed in the ED.   IV fluid hydration Optimize magnesium and potassium levels Early mobilization IV antiemetics as needed IV analgesics as needed Appreciate general surgery's recommendations. Repeat abdominal x-ray in the morning  Type 2 diabetes with hyperglycemia Hemoglobin A1c 7.4 on 03/17/2022 Start insulin sliding scale every 4 hours while NPO  High anion gap metabolic acidosis Serum bicarb 16, anion gap 17 Lactic acid 1.2 Continue IV fluid  CKD 3A Close to baseline Avoid nephrotoxic agents, dehydration, and hypotension. Monitor urine output Repeat BMP in the morning  Ambulatory dysfunction with a walker Physical debility PT OT assessment Fall precautions  HFpEF Paroxysmal A-fib not on anticoagulation Rate is currently controlled Closely monitor volume status while on IV fluid Strict I's and O's and daily weight   Time: 75 minutes.   DVT prophylaxis: Subcu heparin 3 times daily  Code Status: Full code  Family Communication: None at bedside  Disposition Plan: Admitted to MedSurg unit  Consults called: General Surgery.  Admission status: Inpatient status.   Status is: Inpatient The patient requires at least 2 midnights for further evaluation and treatment of present condition.   Darlin Drop MD [facility] Pager [phone]  If 7PM-7AM, please contact night-coverage [email or web address] Password [email or web address]  12/09/2022, 1:26 AM

## 2022-12-10 ENCOUNTER — Inpatient Hospital Stay (HOSPITAL_COMMUNITY): Payer: Medicare HMO

## 2022-12-10 DIAGNOSIS — Z794 Long term (current) use of insulin: Secondary | ICD-10-CM

## 2022-12-10 DIAGNOSIS — I10 Essential (primary) hypertension: Secondary | ICD-10-CM | POA: Diagnosis not present

## 2022-12-10 DIAGNOSIS — N1831 Chronic kidney disease, stage 3a: Secondary | ICD-10-CM

## 2022-12-10 DIAGNOSIS — I5032 Chronic diastolic (congestive) heart failure: Secondary | ICD-10-CM

## 2022-12-10 DIAGNOSIS — N390 Urinary tract infection, site not specified: Secondary | ICD-10-CM

## 2022-12-10 DIAGNOSIS — N3000 Acute cystitis without hematuria: Secondary | ICD-10-CM | POA: Diagnosis not present

## 2022-12-10 DIAGNOSIS — E118 Type 2 diabetes mellitus with unspecified complications: Secondary | ICD-10-CM

## 2022-12-10 DIAGNOSIS — I480 Paroxysmal atrial fibrillation: Secondary | ICD-10-CM

## 2022-12-10 DIAGNOSIS — K56609 Unspecified intestinal obstruction, unspecified as to partial versus complete obstruction: Secondary | ICD-10-CM | POA: Diagnosis not present

## 2022-12-10 LAB — URINALYSIS, W/ REFLEX TO CULTURE (INFECTION SUSPECTED)
Bilirubin Urine: NEGATIVE
Glucose, UA: NEGATIVE mg/dL
Ketones, ur: NEGATIVE mg/dL
Nitrite: POSITIVE — AB
Protein, ur: 30 mg/dL — AB
Specific Gravity, Urine: 1.02 (ref 1.005–1.030)
WBC, UA: >50 WBC/hpf (ref 0–5)
pH: 7.5 (ref 5.0–8.0)

## 2022-12-10 LAB — GLUCOSE, CAPILLARY
Glucose-Capillary: 101 mg/dL — ABNORMAL HIGH (ref 70–99)
Glucose-Capillary: 185 mg/dL — ABNORMAL HIGH (ref 70–99)
Glucose-Capillary: 204 mg/dL — ABNORMAL HIGH (ref 70–99)
Glucose-Capillary: 76 mg/dL (ref 70–99)
Glucose-Capillary: 79 mg/dL (ref 70–99)
Glucose-Capillary: 86 mg/dL (ref 70–99)

## 2022-12-10 LAB — CBC
HCT: 33.7 % — ABNORMAL LOW (ref 36.0–46.0)
Hemoglobin: 10.6 g/dL — ABNORMAL LOW (ref 12.0–15.0)
MCH: 28.8 pg (ref 26.0–34.0)
MCHC: 31.5 g/dL (ref 30.0–36.0)
MCV: 91.6 fL (ref 80.0–100.0)
Platelets: 246 10*3/uL (ref 150–400)
RBC: 3.68 MIL/uL — ABNORMAL LOW (ref 3.87–5.11)
RDW: 14.7 % (ref 11.5–15.5)
WBC: 5.3 10*3/uL (ref 4.0–10.5)
nRBC: 0 % (ref 0.0–0.2)

## 2022-12-10 LAB — BASIC METABOLIC PANEL
Anion gap: 9 (ref 5–15)
BUN: 19 mg/dL (ref 8–23)
CO2: 24 mmol/L (ref 22–32)
Calcium: 9.5 mg/dL (ref 8.9–10.3)
Chloride: 112 mmol/L — ABNORMAL HIGH (ref 98–111)
Creatinine, Ser: 1.04 mg/dL — ABNORMAL HIGH (ref 0.44–1.00)
GFR, Estimated: 51 mL/min — ABNORMAL LOW (ref >60)
Glucose, Bld: 92 mg/dL (ref 70–99)
Potassium: 4.1 mmol/L (ref 3.5–5.1)
Sodium: 145 mmol/L (ref 135–145)

## 2022-12-10 LAB — MAGNESIUM: Magnesium: 2.2 mg/dL (ref 1.7–2.4)

## 2022-12-10 MED ORDER — PANTOPRAZOLE SODIUM 40 MG PO TBEC
40.0000 mg | DELAYED_RELEASE_TABLET | Freq: Every day | ORAL | Status: DC
  Administered 2022-12-10 – 2022-12-13 (×4): 40 mg via ORAL
  Filled 2022-12-10 (×4): qty 1

## 2022-12-10 MED ORDER — ATORVASTATIN CALCIUM 40 MG PO TABS: 40.0000 mg | ORAL_TABLET | Freq: Every day | ORAL | Status: DC

## 2022-12-10 MED ORDER — AMLODIPINE BESYLATE 5 MG PO TABS
5.0000 mg | ORAL_TABLET | Freq: Every day | ORAL | Status: DC
  Administered 2022-12-10 – 2022-12-13 (×4): 5 mg via ORAL
  Filled 2022-12-10 (×4): qty 1

## 2022-12-10 MED ORDER — POLYETHYLENE GLYCOL 3350 17 G PO PACK
17.0000 g | PACK | Freq: Every day | ORAL | Status: DC
  Administered 2022-12-10 – 2022-12-11 (×2): 17 g via ORAL
  Filled 2022-12-10 (×2): qty 1

## 2022-12-10 MED ORDER — CEPHALEXIN 500 MG PO CAPS
500.0000 mg | ORAL_CAPSULE | Freq: Two times a day (BID) | ORAL | Status: DC
  Administered 2022-12-10 – 2022-12-13 (×7): 500 mg via ORAL
  Filled 2022-12-10 (×7): qty 1

## 2022-12-10 MED ORDER — ALLOPURINOL 100 MG PO TABS
100.0000 mg | ORAL_TABLET | Freq: Every day | ORAL | Status: DC
  Administered 2022-12-10 – 2022-12-13 (×4): 100 mg via ORAL
  Filled 2022-12-10 (×4): qty 1

## 2022-12-10 MED ORDER — ATORVASTATIN CALCIUM 40 MG PO TABS
40.0000 mg | ORAL_TABLET | Freq: Every day | ORAL | Status: DC
  Administered 2022-12-11 – 2022-12-13 (×3): 40 mg via ORAL
  Filled 2022-12-10 (×3): qty 1

## 2022-12-10 MED ORDER — CLOPIDOGREL BISULFATE 75 MG PO TABS
75.0000 mg | ORAL_TABLET | Freq: Every day | ORAL | Status: DC
  Administered 2022-12-10 – 2022-12-13 (×4): 75 mg via ORAL
  Filled 2022-12-10 (×4): qty 1

## 2022-12-10 NOTE — Evaluation (Signed)
Occupational Therapy Evaluation Patient Details Name: Katherine [name] MRN: [id number] DOB: April 13, 1930 Today's Date: 12/10/2022   History of Present Illness Patient is an 89 y/o female admitted 12/08/22 with abdominal pain with N&V with history of similar symptoms with SBO.  NG tube placed for decompression.  PMH positive for anemia, SBO, CHF, CKD, DM2, HH, ,HTN, PAF, stroke with R weakness, hysterectomy, appendectomy, cholecystecomy, and breast surgery.   Clinical Impression   PTA, pt was living at home alone with an aide 7days/week for 4hrs/day. Her aide would assist her with all ADL. Pt reports she was modified independent with functional mobility at RW level. She reports her 4 sisters were also available to assist her as needed. Pt currently requires minA for bed mobility, ADL and functional mobility at RW level. She reports slight dizziness with standing, resolved after . Due to decline in current level of function, pt would benefit from acute OT to address established goals. Patient will benefit from continued inpatient follow up therapy, <3 hours/day. Will continue to follow acutely.        If plan is discharge home, recommend the following: A little help with walking and/or transfers;A little help with bathing/dressing/bathroom;Assistance with cooking/housework    Functional Status Assessment  Patient has had a recent decline in their functional status and demonstrates the ability to make significant improvements in function in a reasonable and predictable amount of time.  Equipment Recommendations  BSC/3in1    Recommendations for Other Services       Precautions / Restrictions Precautions Precautions: Fall Precaution Comments: NGT to IWS Restrictions Weight Bearing Restrictions: No      Mobility Bed Mobility Overal bed mobility: Needs Assistance Bed Mobility: Supine to Sit, Sit to Supine     Supine to sit: Min assist, HOB elevated, Used rails Sit to supine:  Supervision   General bed mobility comments: minA to progress trunk to upright position;cues for slight log roll    Transfers Overall transfer level: Needs assistance Equipment used: Rolling walker (2 wheels) Transfers: Sit to/from Stand Sit to Stand: Min assist           General transfer comment: minA for safety and stability, cues for hand placement      Balance Overall balance assessment: Needs assistance Sitting-balance support: Feet supported Sitting balance-Leahy Scale: Good     Standing balance support: Bilateral upper extremity supported, Reliant on assistive device for balance Standing balance-Leahy Scale: Poor Standing balance comment: reliant on BUE support on RW minA for stability with dynamic balance                           ADL either performed or assessed with clinical judgement   ADL Overall ADL's : Needs assistance/impaired Eating/Feeding: Set up;Sitting   Grooming: Minimal assistance;Standing   Upper Body Bathing: Set up;Sitting   Lower Body Bathing: Minimal assistance;Sit to/from stand   Upper Body Dressing : Set up;Sitting   Lower Body Dressing: Minimal assistance;Sit to/from stand   Toilet Transfer: Minimal assistance;Ambulation Toilet Transfer Details (indicate cue type and reason): short distances simulated in room Toileting- Clothing Manipulation and Hygiene: Minimal assistance;Sit to/from stand       Functional mobility during ADLs: Minimal assistance;Rolling walker (2 wheels) General ADL Comments: limited in distance at this time as pt was connected to wall suction on NG tube. Pt required minA for stability in standing, reporting dizziness initially resolving after about .     Vision  Perception         Praxis         Pertinent Vitals/Pain Pain Assessment Pain Assessment: Faces Faces Pain Scale: Hurts little more Pain Location: abdomen Pain Descriptors / Indicators: Discomfort, Grimacing,  Guarding Pain Intervention(s): Monitored during session, Limited activity within patient's tolerance     Extremity/Trunk Assessment Upper Extremity Assessment Upper Extremity Assessment: Right hand dominant;LUE deficits/detail;RUE deficits/detail RUE Deficits / Details: pt reports slight decreased strength and sensation at baseline from previous strokes. noted slight edema in R hand;grossly 3+/5 RUE Sensation: decreased light touch RUE Coordination: decreased fine motor LUE Deficits / Details: grossly 4/5   Lower Extremity Assessment Lower Extremity Assessment: Defer to PT evaluation   Cervical / Trunk Assessment Cervical / Trunk Assessment: Kyphotic   Communication Communication Communication: Difficulty communicating thoughts/reduced clarity of speech   Cognition Arousal: Alert Behavior During Therapy: WFL for tasks assessed/performed Overall Cognitive Status: History of cognitive impairments - at baseline                                 General Comments: pt reports aphasia and word finding limitations at baseline     General Comments  vss on RA    Exercises     Shoulder Instructions      Home Living Family/patient expects to be discharged to:: Private residence Living Arrangements: Alone Available Help at Discharge: Available PRN/intermittently;Personal care attendant (aide comes daily several hours a day) Type of Home: Apartment Home Access: Level entry     Home Layout: One level     Bathroom Shower/Tub: Chief Strategy Officer: Standard     Home Equipment: Grab bars - tub/shower;Hand held shower head;Tub bench;Rolling Walker (2 wheels);Cane - single point;Wheelchair - manual   Additional Comments: aide 7 days/week for 4hrs, assists with "everything";pt has 4 sisters that are availabe to help as needed      Prior Functioning/Environment Prior Level of Function : Needs assist             Mobility Comments: uses walker or cane  mainly ADLs Comments: aide and family assists with shower, groceries, meals, dressing LB, bathing LB, picking up medications        OT Problem List: Decreased activity tolerance;Impaired balance (sitting and/or standing);Pain      OT Treatment/Interventions: Self-care/ADL training;Therapeutic exercise;DME and/or AE instruction;Therapeutic activities;Patient/family education;Balance training    OT Goals(Current goals can be found in the care plan section) Acute Rehab OT Goals Patient Stated Goal: to get stronger OT Goal Formulation: With patient Time For Goal Achievement: 12/24/22 Potential to Achieve Goals: Good ADL Goals Pt Will Perform Grooming: with set-up;standing Pt Will Perform Lower Body Dressing: with set-up;sit to/from stand Pt Will Transfer to Toilet: with set-up;ambulating Additional ADL Goal #1: Pt will demonstrate independence with 3 fall prevention strategies during ADL/functional mobility.  OT Frequency: Min 2X/week    Co-evaluation              AM-PAC OT "6 Clicks" Daily Activity     Outcome Measure Help from another person eating meals?: A Little Help from another person taking care of personal grooming?: A Little Help from another person toileting, which includes using toliet, bedpan, or urinal?: A Little Help from another person bathing (including washing, rinsing, drying)?: A Little Help from another person to put on and taking off regular upper body clothing?: A Little Help from another person to put on and taking  off regular lower body clothing?: A Little 6 Click Score: 18   End of Session Equipment Utilized During Treatment: Gait belt;Rolling walker (2 wheels) Nurse Communication: Mobility status  Activity Tolerance: Patient tolerated treatment well Patient left: in bed;with call bell/phone within reach;with bed alarm set  OT Visit Diagnosis: Other abnormalities of gait and mobility (R26.89);Muscle weakness (generalized) (M62.81)                 Time: 5409-8119 OT Time Calculation (min): 15 min Charges:  OT General Charges $OT Visit: 1 Visit OT Evaluation $OT Eval Low Complexity: 1 Low  Sarya Linenberger OTR/L Acute Rehabilitation Services Office: [phone]   [place] 12/10/2022, 10:31 AM

## 2022-12-10 NOTE — Assessment & Plan Note (Signed)
Blood pressure mildly elevated. -Restarting Home amlodipine -As needed hydralazine

## 2022-12-10 NOTE — Progress Notes (Signed)
Progress Note   Patient: Katherine [name] ZOX:[id number] DOB: April 18, 1930 DOA: 12/08/2022     2 DOS: the patient was seen and examined on 12/10/2022   Brief hospital course: Taken from prior notes.   KAMELA STONES is a 89 y.o. female with medical history significant for type 2 diabetes, hypertension, hyperlipidemia, paroxysmal A-fib not on oral anticoagulation, CVA on Plavix, coronary artery disease, chronic diastolic CHF, mild to moderate aortic stenosis, CKD stage IIIa, who presents from home with complaints of diffuse abdominal pain for the past 2 days.  Associated with nausea and vomiting.   On arrival to ED stable vitals, labs pertinent for bicarb of 16, BUN 28, creatinine 1.14 CT abdomen concerning for SBO with a transition point within the left lower abdomen.  General surgery was consulted and NG tube was placed in ED.  10/9: Vital stable.  UA today with concern of UTI with positive nitrites and leukocytes with many bacteria, pending urine culture, prior urine culture with history of Citrobacter resistant to ceftriaxone.  SBO protocol GI series with no evidence of obstruction, NG tube was pulled out and patient was started on clear liquid diet.     Assessment and Plan: * SBO (small bowel obstruction) (HCC) General surgery is following, SBO protocol GI series with contrast in colon.  No more nausea or vomiting so NG tube was pulled out.  Still no BM. -Started on clear liquid diet -Continue with supportive care  UTI (urinary tract infection) UA with concern of UTI.  Patient to have some dysuria and increased urinary urgency. -Urine cultures pending -Started on Keflex  Essential hypertension Blood pressure mildly elevated. -Restarting Home amlodipine -As needed hydralazine  Chronic diastolic CHF (congestive heart failure) (HCC) Clinically appears euvolemic. -Currently holding home torsemide -Monitor volume status  Type 2 diabetes mellitus with complication, with long-term  current use of insulin (HCC) A1c of 7.4 in January 2024 -SSI  CKD (chronic kidney disease) stage 3, GFR 30-59 ml/min (HCC) Appears to have CKD stage III A. Creatinine seems stable and at baseline -Monitor renal function -Avoid nephrotoxins  Paroxysmal atrial fibrillation (HCC) Not on any anticoagulation. -Continue to monitor   Subjective: Patient was seen and examined today.  Having some lower abdominal pain, has not passing any flatus, no BM yet.  No nausea or vomiting.  Also complaining of some dysuria and increased urinary urgency.  Physical Exam: Vitals:   12/10/22 0011 12/10/22 0411 12/10/22 0747 12/10/22 1609  BP: (!) 117/48 130/65 (!) 145/83 (!) 162/73  Pulse: 60 68 74 60  Resp: 17 17    Temp: 98.3 F (36.8 C) 98.5 F (36.9 C) 98.2 F (36.8 C) 98.7 F (37.1 C)  TempSrc: Oral Oral Oral   SpO2: 98% 98% 99% 100%  Weight:      Height:       General.  Frail elderly lady, in no acute distress. Pulmonary.  Lungs clear bilaterally, normal respiratory effort. CV.  Regular rate and rhythm, no JVD, rub or murmur. Abdomen.  Soft, nontender, nondistended, BS positive. CNS.  Alert and oriented .  No focal neurologic deficit. Extremities.  No edema, no cyanosis, pulses intact and symmetrical.  Data Reviewed: Prior data reviewed  Family Communication:   Disposition: Status is: Inpatient Remains inpatient appropriate because:   Planned Discharge Destination: Home  DVT prophylaxis.  Subcu heparin Time spent: 50 minutes  This record has been created using Conservation officer, historic buildings. Errors have been sought and corrected,but may not always be located. Such  creation errors do not reflect on the standard of care.   Author: Arnetha Courser, MD 12/10/2022 4:48 PM  For on call review [email or web address].

## 2022-12-10 NOTE — Progress Notes (Signed)
Subjective: Patient has chronic abdominal discomfort.  No flatus or BM yet, but plain film with resolution of SBO and oral contrast in colon.  No NGT output documented, but about 500cc in cannister  ROS: See above, otherwise other systems negative  Objective: Vital signs in last 24 hours: Temp:  [98.2 F (36.8 C)-99.1 F (37.3 C)] 98.2 F (36.8 C) (10/09 0747) Pulse Rate:  [60-130] 74 (10/09 0747) Resp:  [14-20] 17 (10/09 0411) BP: (117-153)/(48-83) 145/83 (10/09 0747) SpO2:  [91 %-100 %] 99 % (10/09 0747) Last BM Date : 12/07/22  Intake/Output from previous day: 10/08 0701 - 10/09 0700 In: 731.7 [I.V.:681.7; IV Piggyback:50] Out: 200 [Urine:200] Intake/Output this shift: No intake/output data recorded.  PE: Gen: NAD Abd: soft, some diffuse tenderness as consistent with previous exams, NGT in place with some bilious output in cannister, ND  Lab Results:  Recent Labs    12/09/22 0235 12/10/22 0443  WBC 6.5 5.3  HGB 12.8 10.6*  HCT 39.5 33.7*  PLT 226 246   BMET Recent Labs    12/09/22 0235 12/10/22 0443  NA 140 145  K 3.8 4.1  CL 102 112*  CO2 21* 24  GLUCOSE 140* 92  BUN 25* 19  CREATININE 1.04* 1.04*  CALCIUM 9.7 9.5   PT/INR No results for input(s): "LABPROT", "INR" in the last 72 hours. CMP     Component Value Date/Time   NA 145 12/10/2022 0443   K 4.1 12/10/2022 0443   CL 112 (H) 12/10/2022 0443   CO2 24 12/10/2022 0443   GLUCOSE 92 12/10/2022 0443   BUN 19 12/10/2022 0443   CREATININE 1.04 (H) 12/10/2022 0443   CALCIUM 9.5 12/10/2022 0443   PROT 7.0 12/08/2022 2241   ALBUMIN 3.8 12/08/2022 2241   AST 15 12/08/2022 2241   ALT 10 12/08/2022 2241   ALKPHOS 81 12/08/2022 2241   BILITOT 0.5 12/08/2022 2241   GFRNONAA 51 (L) 12/10/2022 0443   GFRAA 35 (L) 05/08/2019 2043   Lipase     Component Value Date/Time   LIPASE 28 11/04/2022 1326       Studies/Results: DG Abd Portable 1V-Small Bowel Obstruction Protocol-24 hr  delay  Result Date: 12/10/2022 CLINICAL DATA:  Confirm NG tube placement. Small-bowel obstruction protocol. EXAM: PORTABLE ABDOMEN - 1 VIEW COMPARISON:  12/09/2022. FINDINGS: The bowel gas pattern is normal. Contrast is identified within the colon. The side port of an enteric tube is above the level of the diaphragm and should be advanced a proximally 9 cm. Surgical clips are noted in the right upper quadrant. An IVC filter is present. Stable calcifications are noted over the pelvis bilaterally. IMPRESSION: 1. No evidence of bowel obstruction. 2. Side port of the enteric tube is above the level of the diaphragm and should be advanced 9 cm. Electronically Signed   By: Thornell Sartorius M.D.   On: 12/10/2022 03:41   DG Abd Portable 1V-Small Bowel Obstruction Protocol-initial, 8 hr delay  Result Date: 12/09/2022 CLINICAL DATA:  Small-bowel obstruction.  8 hour delayed film. EXAM: PORTABLE ABDOMEN - 1 VIEW COMPARISON:  12/09/2022, 12:10 a.m. FINDINGS: The bowel gas pattern is non-obstructive. There is paucity of bowel gas. No evidence of pneumoperitoneum, within the limitations of a supine film. No acute osseous abnormalities. Redemonstration of extensive soft tissue calcifications overlying bilateral iliac wings. Surgical changes, devices, tubes and lines: IVC filter noted overlying the L2-3 vertebral bodies on the right. There are surgical clips in the right upper  quadrant, typical of a previous cholecystectomy. Enteric tube is seen with its tip and side hole overlying the left upper quadrant, within the distal stomach. IMPRESSION: 1. Nonobstructive bowel gas pattern. Electronically Signed   By: Jules Schick M.D.   On: 12/09/2022 10:51   DG Abd Portable 1V-Small Bowel Protocol-Position Verification  Result Date: 12/09/2022 CLINICAL DATA:  NG placement. EXAM: PORTABLE ABDOMEN - 1 VIEW COMPARISON:  CT abdomen pelvis dated 12/08/2022. FINDINGS: Enteric tube with tip over the upper abdomen likely in the body of  the stomach. Right upper quadrant cholecystectomy clips. No acute osseous pathology. IMPRESSION: Enteric tube with tip over the body of the stomach. Electronically Signed   By: Elgie Collard M.D.   On: 12/09/2022 00:51   CT ABDOMEN PELVIS WO CONTRAST  Result Date: 12/08/2022 CLINICAL DATA:  Bowel obstruction suspected EXAM: CT ABDOMEN AND PELVIS WITHOUT CONTRAST TECHNIQUE: Multidetector CT imaging of the abdomen and pelvis was performed following the standard protocol without IV contrast. RADIATION DOSE REDUCTION: This exam was performed according to the departmental dose-optimization program which includes automated exposure control, adjustment of the mA and/or kV according to patient size and/or use of iterative reconstruction technique. COMPARISON:  CT abdomen pelvis 11/04/2022 FINDINGS: Lower chest: Coronary artery calcification.  Small hiatal hernia. Hepatobiliary: No focal liver abnormality. Status post cholecystectomy. No biliary dilatation. Pancreas: Diffusely atrophic. No focal lesion. Otherwise normal pancreatic contour. No surrounding inflammatory changes. No main pancreatic ductal dilatation. Spleen: Normal in size without focal abnormality. Adrenals/Urinary Tract: No adrenal nodule bilaterally. No nephrolithiasis and no hydronephrosis. No definite contour-deforming renal mass. No ureterolithiasis or hydroureter. The urinary bladder is unremarkable. Stomach/Bowel: Stomach is within normal limits. Interval worsening of short segment small bowel within the mid left abdomen dilatation with fluid and fecalized material measuring up to 3.5 cm in caliber. Suggestion of a transition point within the left lower abdomen (2:48, 5:63). No pneumatosis. No evidence of large bowel wall thickening or dilatation. The appendix is not definitely identified with no inflammatory changes in the right lower quadrant to suggest acute appendicitis. Vascular/Lymphatic: Inferior vena cava filter in grossly appropriate  position. No abdominal aorta or iliac aneurysm. Severe atherosclerotic plaque of the aorta and its branches. No abdominal, pelvic, or inguinal lymphadenopathy. Reproductive: Status post hysterectomy. No adnexal masses. Other: No intraperitoneal free fluid. No intraperitoneal free gas. No organized fluid collection. Musculoskeletal: No abdominal wall hernia or abnormality. Calcified injection granulomas within the gluteal soft tissues. No suspicious lytic or blastic osseous lesions. No acute displaced fracture. Multilevel degenerative changes of the spine. IMPRESSION: 1. Interval worsening of short segment mid left abdomen small bowel dilatation with fluid and fecalized material measuring up to 3.5 cm in caliber.Suggestion of a transition point within the left lower abdomen; however, limited evaluation on this noncontrast study. Finding suggestive of a small-bowel obstruction. 2. Small hiatal hernia. 3. Aortic Atherosclerosis (ICD10-I70.0). Electronically Signed   By: Tish Frederickson M.D.   On: 12/08/2022 23:39    Anti-infectives: Anti-infectives (From admission, onward)    None        Assessment/Plan Recurrent SBO -resolved quickly with contrast in her colon.  No bowel function yet, but given appearance on x-ray, will Dc NGT and let her start on some CLD. -we discussed the need for maintaining a low fiber diet at home at discharge to help minimize recurrence risk.   FEN - CLD VTE - heparin  ID - none currently needed  CAD CHF DM HTN H/O CVA  I reviewed hospitalist notes,  last 24 h vitals and pain scores, last 48 h intake and output, last 24 h labs and trends, and last 24 h imaging results.   LOS: 2 days    Letha Cape , [facility] 12/10/2022, 9:11 AM Please see Amion for pager number during day hours 7:00am-4:30pm or 7:00am -11:30am on weekends

## 2022-12-10 NOTE — Assessment & Plan Note (Signed)
Appears to have CKD stage III A. Creatinine seems stable and at baseline -Monitor renal function -Avoid nephrotoxins

## 2022-12-10 NOTE — Hospital Course (Addendum)
Taken from prior notes.   Katherine [name] is a 89 y.o. female with medical history significant for type 2 diabetes, hypertension, hyperlipidemia, paroxysmal A-fib not on oral anticoagulation, CVA on Plavix, coronary artery disease, chronic diastolic CHF, mild to moderate aortic stenosis, CKD stage IIIa, who presents from home with complaints of diffuse abdominal pain for the past 2 days.  Associated with nausea and vomiting.   On arrival to ED stable vitals, labs pertinent for bicarb of 16, BUN 28, creatinine 1.14 CT abdomen concerning for SBO with a transition point within the left lower abdomen.  General surgery was consulted and NG tube was placed in ED.  10/9: Vital stable.  UA today with concern of UTI with positive nitrites and leukocytes with many bacteria, pending urine culture, prior urine culture with history of Citrobacter resistant to ceftriaxone.  SBO protocol GI series with no evidence of obstruction, NG tube was pulled out and patient was started on clear liquid diet.  10/10: Hemodynamically stable, urine cultures with Klebsiella pneumonia.  Tolerating full liquid diet but still not passing any flatus or BM yet.  Bowel regimen was also added. PT is recommending SNF but patient wants to go back home with home health.  Also discussed with sister who also confirmed that patient does not want to go to any nursing home.  They do not live together but they keep checking on her.  10/11: Had a small BM yesterday, tolerating soft diet.  Urine culture with Klebsiella pneumonia only resistant to ampicillin and intermediate to nitrofurantoin, continuing Keflex to complete a 5-day course.  Continue with bowel regimen, likely be discharged tomorrow as she does not want to go to rehab.  Home health ordered.  10/12: Patient remained hemodynamically stable, having bowel movement and tolerating diet. Patient was also refusing home health services stating that she had an aide who can provide proper  assistance.  TOC is on board. Patient was given 3 more doses of Keflex to complete the course for UTI.  She will continue with current home medications.  Patient should encourage p.o. hydration and avoiding constipation.  She should be using MiraLAX regularly as needed.  She will continue on her current medications and follow-up with her providers closely for further management.

## 2022-12-10 NOTE — Assessment & Plan Note (Signed)
A1c of 7.4 in January 2024 -SSI

## 2022-12-10 NOTE — Assessment & Plan Note (Signed)
UA with concern of UTI.  Patient to have some dysuria and increased urinary urgency. -Urine cultures pending -Started on Keflex

## 2022-12-10 NOTE — Assessment & Plan Note (Signed)
General surgery is following, SBO protocol GI series with contrast in colon.  No more nausea or vomiting so NG tube was pulled out.  Still no BM. -Started on clear liquid diet -Continue with supportive care

## 2022-12-10 NOTE — Assessment & Plan Note (Signed)
Clinically appears euvolemic. -Currently holding home torsemide -Monitor volume status

## 2022-12-10 NOTE — Assessment & Plan Note (Signed)
Not on any anticoagulation. -Continue to monitor

## 2022-12-10 NOTE — TOC Initial Note (Signed)
Transition of Care [facility]) - Initial/Assessment Note    Patient Details  Name: Katherine [name] MRN: [id number] Date of Birth: 1930/06/26  [facility] CM/SW Contact:    Linday Rhodes A [place], Theresia Majors Phone Number: 12/10/2022, 12:14 PM  Clinical Narrative:                  CSW met with pt at bedside along with pt's sister, Maralyn Sago and brother in Social worker. She stated that she was aware of recommendation for SNF but did not want to attend. She stated that she has caregivers 24/7 at home and did not feel that would be needed. CSW asked if pt was interested in [facility] and she said she did not want a referral at this time. Providers notified.   No other needs identified at this time. TOC will sign off, please consult again if TOC needs arise.    Expected Discharge Plan: Skilled Nursing Facility Barriers to Discharge: Continued Medical Work up   Patient Goals and CMS Choice            Expected Discharge Plan and Services In-house Referral: Clinical Social Work     Living arrangements for the past 2 months: Apartment                                      Prior Living Arrangements/Services Living arrangements for the past 2 months: Apartment Lives with:: Self          Need for Family Participation in Patient Care: Yes (Comment) Care giver support system in place?: Yes (comment)      Activities of Daily Living   ADL Screening (condition at time of admission) Independently performs ADLs?: No Does the patient have a NEW difficulty with bathing/dressing/toileting/self-feeding that is expected to last >3 days?: Yes (Initiates electronic notice to provider for possible OT consult) Does the patient have a NEW difficulty with getting in/out of bed, walking, or climbing stairs that is expected to last >3 days?: Yes (Initiates electronic notice to provider for possible PT consult) Does the patient have a NEW difficulty with communication that is expected to last >3 days?: No Is  the patient deaf or have difficulty hearing?: No Does the patient have difficulty seeing, even when wearing glasses/contacts?: Yes Does the patient have difficulty concentrating, remembering, or making decisions?: No  Permission Sought/Granted                  Emotional Assessment   Attitude/Demeanor/Rapport: Engaged Affect (typically observed): Pleasant Orientation: : Oriented to Self, Oriented to  Time, Oriented to Place, Oriented to Situation Alcohol / Substance Use: Not Applicable Psych Involvement: No (comment)  Admission diagnosis:  Small bowel obstruction (HCC) [K56.609] SBO (small bowel obstruction) (HCC) [K56.609] Patient Active Problem List   Diagnosis Date Noted   Controlled type 2 diabetes mellitus without complication, without long-term current use of insulin (HCC) 11/05/2022   History of CVA (cerebrovascular accident) 11/05/2022   History of CAD (coronary artery disease) 11/05/2022   CKD (chronic kidney disease) stage 3, GFR 30-59 ml/min (HCC) 11/05/2022   Chronic dyspnea 11/05/2022   Acute kidney injury (HCC) 11/05/2022   Chronic anemia 11/05/2022   Gout 11/05/2022   History of glaucoma 11/05/2022   SBO (small bowel obstruction) (HCC) 03/17/2022   Type 2 diabetes mellitus with complication, with long-term current use of insulin (HCC) 03/17/2022   Paroxysmal atrial fibrillation (HCC) 03/17/2022  Dizziness 02/02/2020   Arterial ischemic stroke, vertebrobasilar, brainstem, acute (HCC) 02/02/2020   Essential hypertension 02/02/2020   Cerebrovascular disease 02/02/2020   Mass of urinary bladder 02/02/2020   Hematuria 02/02/2020   CAD (coronary artery disease) 02/02/2020   Chronic diastolic CHF (congestive heart failure) (HCC) 02/02/2020   Small bowel obstruction (HCC) 10/31/2017   Abdominal pain    Nausea and vomiting    Acute colitis 05/30/2017   Chest pain 12/21/2015   ACS (acute coronary syndrome) (HCC) 12/21/2015   Bowel obstruction (HCC) 10/15/2013    Angioedema due to seafood allergy 09/11/2013   Nipple discharge, bloody 01/07/2012   PCP:  Rinaldo Cloud, MD Pharmacy:   [facility] - [place], [place] - [address] AT [address] [place] [address] Phone: [phone] Fax: [phone]  [id number] - [place], [place] - [address] AT [facility] [address] [place] [address] Phone: [phone] Fax: [phone]     Social Determinants of Health (SDOH) Social History: SDOH Screenings   Food Insecurity: No Food Insecurity (12/09/2022)  Housing: Low Risk  (12/09/2022)  Transportation Needs: No Transportation Needs (12/09/2022)  Utilities: Not At Risk (12/09/2022)  Tobacco Use: Medium Risk (12/08/2022)   SDOH Interventions:     Readmission Risk Interventions     No data to display

## 2022-12-10 NOTE — Plan of Care (Signed)
Patient alert/oriented X4. Patient compliant with medication administration and is tolerating cont. IV fluids. NG tube pulled out of L nare with 550 mL brown output. Patient ate 100% of meal following pulling of tube with increased nausea after consuming food. Zofran administered as needed. Patient ambulated with assistance in room. VSS, no complaints at this time.   Problem: Education: Goal: Ability to describe self-care measures that may prevent or decrease complications (Diabetes Survival Skills Education) will improve Outcome: Progressing   Problem: Education: Goal: Individualized Educational Video(s) Outcome: Progressing   Problem: Coping: Goal: Ability to adjust to condition or change in health will improve Outcome: Progressing   Problem: Fluid Volume: Goal: Ability to maintain a balanced intake and output will improve Outcome: Progressing   Problem: Health Behavior/Discharge Planning: Goal: Ability to manage health-related needs will improve Outcome: Progressing   Problem: Metabolic: Goal: Ability to maintain appropriate glucose levels will improve Outcome: Progressing   Problem: Nutritional: Goal: Maintenance of adequate nutrition will improve Outcome: Progressing   Problem: Nutritional: Goal: Progress toward achieving an optimal weight will improve Outcome: Progressing   Problem: Tissue Perfusion: Goal: Adequacy of tissue perfusion will improve Outcome: Progressing   Problem: Education: Goal: Knowledge of General Education information will improve Description: Including pain rating scale, medication(s)/side effects and non-pharmacologic comfort measures Outcome: Progressing   Problem: Health Behavior/Discharge Planning: Goal: Ability to manage health-related needs will improve Outcome: Progressing   Problem: Clinical Measurements: Goal: Ability to maintain clinical measurements within normal limits will improve Outcome: Progressing   Problem: Clinical  Measurements: Goal: Will remain free from infection Outcome: Progressing   Problem: Clinical Measurements: Goal: Diagnostic test results will improve Outcome: Progressing   Problem: Clinical Measurements: Goal: Respiratory complications will improve Outcome: Progressing   Problem: Clinical Measurements: Goal: Cardiovascular complication will be avoided Outcome: Progressing   Problem: Activity: Goal: Risk for activity intolerance will decrease Outcome: Progressing   Problem: Nutrition: Goal: Adequate nutrition will be maintained Outcome: Progressing   Problem: Coping: Goal: Level of anxiety will decrease Outcome: Progressing   Problem: Elimination: Goal: Will not experience complications related to bowel motility Outcome: Progressing   Problem: Elimination: Goal: Will not experience complications related to urinary retention Outcome: Progressing   Problem: Skin Integrity: Goal: Risk for impaired skin integrity will decrease Outcome: Progressing

## 2022-12-11 DIAGNOSIS — I10 Essential (primary) hypertension: Secondary | ICD-10-CM | POA: Diagnosis not present

## 2022-12-11 DIAGNOSIS — N3000 Acute cystitis without hematuria: Secondary | ICD-10-CM | POA: Diagnosis not present

## 2022-12-11 DIAGNOSIS — K56609 Unspecified intestinal obstruction, unspecified as to partial versus complete obstruction: Secondary | ICD-10-CM | POA: Diagnosis not present

## 2022-12-11 DIAGNOSIS — I5032 Chronic diastolic (congestive) heart failure: Secondary | ICD-10-CM | POA: Diagnosis not present

## 2022-12-11 LAB — GLUCOSE, CAPILLARY
Glucose-Capillary: 102 mg/dL — ABNORMAL HIGH (ref 70–99)
Glucose-Capillary: 105 mg/dL — ABNORMAL HIGH (ref 70–99)
Glucose-Capillary: 109 mg/dL — ABNORMAL HIGH (ref 70–99)
Glucose-Capillary: 115 mg/dL — ABNORMAL HIGH (ref 70–99)
Glucose-Capillary: 140 mg/dL — ABNORMAL HIGH (ref 70–99)
Glucose-Capillary: 223 mg/dL — ABNORMAL HIGH (ref 70–99)
Glucose-Capillary: 57 mg/dL — ABNORMAL LOW (ref 70–99)

## 2022-12-11 LAB — HEMOGLOBIN A1C
Hgb A1c MFr Bld: 7.8 % — ABNORMAL HIGH (ref 4.8–5.6)
Mean Plasma Glucose: 177.16 mg/dL

## 2022-12-11 MED ORDER — GLYCERIN (LAXATIVE) 2 G RE SUPP
1.0000 | Freq: Once | RECTAL | Status: DC
  Filled 2022-12-11 (×2): qty 1

## 2022-12-11 MED ORDER — INSULIN ASPART 100 UNIT/ML IJ SOLN
0.0000 [IU] | Freq: Three times a day (TID) | INTRAMUSCULAR | Status: DC
  Administered 2022-12-12: 1 [IU] via SUBCUTANEOUS

## 2022-12-11 MED ORDER — DEXTROSE 50 % IV SOLN
25.0000 g | INTRAVENOUS | Status: AC
  Administered 2022-12-11: 25 g via INTRAVENOUS
  Filled 2022-12-11: qty 50

## 2022-12-11 MED ORDER — INSULIN ASPART 100 UNIT/ML IJ SOLN: 0.0000 [IU] | Freq: Every day | INTRAMUSCULAR | Status: DC

## 2022-12-11 MED ORDER — POLYETHYLENE GLYCOL 3350 17 G PO PACK
17.0000 g | PACK | Freq: Two times a day (BID) | ORAL | Status: DC
  Administered 2022-12-11 – 2022-12-12 (×3): 17 g via ORAL
  Filled 2022-12-11 (×4): qty 1

## 2022-12-11 NOTE — Discharge Instructions (Signed)
Please follow a low fiber diet to help minimize recurrence of obstructive symptoms.   Please maintain good bowel regimen with dialy miralax for consistent bowel movements.

## 2022-12-11 NOTE — Inpatient Diabetes Management (Signed)
Inpatient Diabetes Program Recommendations  AACE/ADA: New Consensus Statement on Inpatient Glycemic Control (2015)  Target Ranges:  Prepandial:   less than 140 mg/dL      Peak postprandial:   less than 180 mg/dL (1-2 hours)      Critically ill patients:  140 - 180 mg/dL   Lab Results  Component Value Date   GLUCAP 102 (H) 12/11/2022   HGBA1C 7.4 (H) 03/17/2022    Review of Glycemic Control  Latest Reference Range & Units 12/10/22 16:10 12/10/22 20:15 12/10/22 23:59 12/11/22 00:31 12/11/22 03:47 12/11/22 07:38  Glucose-Capillary 70 - 99 mg/dL 811 (H) 914 (H) 57 (L) 223 (H) 105 (H) 102 (H)  (H): Data is abnormally high (L): Data is abnormally low  Diabetes history: DM2 Outpatient Diabetes medications: None Current orders for Inpatient glycemic control: Novolog 0-9 units Q4H  Inpatient Diabetes Program Recommendations:    Hypoglycemia of 57 mg/dL at 78:29 last evening.  Has a diet order. Please consider:  Novolog 0-6 units TID and 0-5 units at bedtime.  Will continue to follow while inpatient.  Thank you, Dulce Sellar, MSN, CDCES Diabetes Coordinator Inpatient Diabetes Program [phone] (team pager from 8a-5p)

## 2022-12-11 NOTE — Assessment & Plan Note (Addendum)
UA with concern of UTI.  Patient to have some dysuria and increased urinary urgency. -Urine cultures growing Klebsiella-only resistant to ampicillin and intermediate for nitrofurantoin -Continue with Keflex-will complete a 5-day course

## 2022-12-11 NOTE — Plan of Care (Signed)
  Problem: Nutritional: Goal: Maintenance of adequate nutrition will improve Outcome: Progressing   Problem: Skin Integrity: Goal: Risk for impaired skin integrity will decrease Outcome: Progressing   Problem: Tissue Perfusion: Goal: Adequacy of tissue perfusion will improve Outcome: Progressing   Problem: Clinical Measurements: Goal: Ability to maintain clinical measurements within normal limits will improve Outcome: Progressing Goal: Diagnostic test results will improve Outcome: Progressing Goal: Respiratory complications will improve Outcome: Progressing Goal: Cardiovascular complication will be avoided Outcome: Progressing   Problem: Activity: Goal: Risk for activity intolerance will decrease Outcome: Progressing   Problem: Nutrition: Goal: Adequate nutrition will be maintained Outcome: Progressing   Problem: Coping: Goal: Level of anxiety will decrease Outcome: Progressing   Problem: Elimination: Goal: Will not experience complications related to bowel motility Outcome: Progressing Goal: Will not experience complications related to urinary retention Outcome: Progressing   Problem: Pain Managment: Goal: General experience of comfort will improve Outcome: Progressing   Problem: Safety: Goal: Ability to remain free from injury will improve Outcome: Progressing   Problem: Skin Integrity: Goal: Risk for impaired skin integrity will decrease Outcome: Progressing

## 2022-12-11 NOTE — Assessment & Plan Note (Addendum)
General surgery is following, SBO protocol GI series with contrast in colon.  No more nausea or vomiting so NG tube was pulled out.  Had a small BM yesterday, still having abdominal pain. -Tolerating soft diet -Added bowel regimen -Continue with supportive care

## 2022-12-11 NOTE — Plan of Care (Signed)
  Problem: Coping: Goal: Ability to adjust to condition or change in health will improve Outcome: Progressing   Problem: Fluid Volume: Goal: Ability to maintain a balanced intake and output will improve Outcome: Progressing   Problem: Metabolic: Goal: Ability to maintain appropriate glucose levels will improve Outcome: Progressing   Problem: Education: Goal: Knowledge of General Education information will improve Description: Including pain rating scale, medication(s)/side effects and non-pharmacologic comfort measures Outcome: Progressing   Problem: Activity: Goal: Risk for activity intolerance will decrease Outcome: Progressing   Problem: Nutrition: Goal: Adequate nutrition will be maintained Outcome: Progressing   Problem: Coping: Goal: Level of anxiety will decrease Outcome: Progressing   Problem: Elimination: Goal: Will not experience complications related to bowel motility Outcome: Progressing Goal: Will not experience complications related to urinary retention Outcome: Progressing   Problem: Pain Managment: Goal: General experience of comfort will improve Outcome: Progressing

## 2022-12-11 NOTE — Progress Notes (Signed)
Subjective: Patient has chronic abdominal discomfort.  + flatus, no BM yet.  Tolerating CLD with no issues  ROS: See above, otherwise other systems negative  Objective: Vital signs in last 24 hours: Temp:  [98.2 F (36.8 C)-98.7 F (37.1 C)] 98.5 F (36.9 C) (10/10 0737) Pulse Rate:  [60-65] 63 (10/10 0737) Resp:  [15] 15 (10/10 0346) BP: (122-162)/(54-73) 134/66 (10/10 0737) SpO2:  [98 %-100 %] 99 % (10/10 0737) Last BM Date : 12/07/22  Intake/Output from previous day: 10/09 0701 - 10/10 0700 In: 2938.2 [P.O.:240; I.V.:2698.2] Out: 525 [Emesis/NG output:525] Intake/Output this shift: No intake/output data recorded.  PE: Gen: NAD Abd: soft, some diffuse tenderness as consistent with previous exams, ND  Lab Results:  Recent Labs    12/09/22 0235 12/10/22 0443  WBC 6.5 5.3  HGB 12.8 10.6*  HCT 39.5 33.7*  PLT 226 246   BMET Recent Labs    12/09/22 0235 12/10/22 0443  NA 140 145  K 3.8 4.1  CL 102 112*  CO2 21* 24  GLUCOSE 140* 92  BUN 25* 19  CREATININE 1.04* 1.04*  CALCIUM 9.7 9.5   PT/INR No results for input(s): "LABPROT", "INR" in the last 72 hours. CMP     Component Value Date/Time   NA 145 12/10/2022 0443   K 4.1 12/10/2022 0443   CL 112 (H) 12/10/2022 0443   CO2 24 12/10/2022 0443   GLUCOSE 92 12/10/2022 0443   BUN 19 12/10/2022 0443   CREATININE 1.04 (H) 12/10/2022 0443   CALCIUM 9.5 12/10/2022 0443   PROT 7.0 12/08/2022 2241   ALBUMIN 3.8 12/08/2022 2241   AST 15 12/08/2022 2241   ALT 10 12/08/2022 2241   ALKPHOS 81 12/08/2022 2241   BILITOT 0.5 12/08/2022 2241   GFRNONAA 51 (L) 12/10/2022 0443   GFRAA 35 (L) 05/08/2019 2043   Lipase     Component Value Date/Time   LIPASE 28 11/04/2022 1326       Studies/Results: DG Abd Portable 1V-Small Bowel Obstruction Protocol-24 hr delay  Result Date: 12/10/2022 CLINICAL DATA:  Confirm NG tube placement. Small-bowel obstruction protocol. EXAM: PORTABLE ABDOMEN - 1 VIEW  COMPARISON:  12/09/2022. FINDINGS: The bowel gas pattern is normal. Contrast is identified within the colon. The side port of an enteric tube is above the level of the diaphragm and should be advanced a proximally 9 cm. Surgical clips are noted in the right upper quadrant. An IVC filter is present. Stable calcifications are noted over the pelvis bilaterally. IMPRESSION: 1. No evidence of bowel obstruction. 2. Side port of the enteric tube is above the level of the diaphragm and should be advanced 9 cm. Electronically Signed   By: Thornell Sartorius M.D.   On: 12/10/2022 03:41   DG Abd Portable 1V-Small Bowel Obstruction Protocol-initial, 8 hr delay  Result Date: 12/09/2022 CLINICAL DATA:  Small-bowel obstruction.  8 hour delayed film. EXAM: PORTABLE ABDOMEN - 1 VIEW COMPARISON:  12/09/2022, 12:10 a.m. FINDINGS: The bowel gas pattern is non-obstructive. There is paucity of bowel gas. No evidence of pneumoperitoneum, within the limitations of a supine film. No acute osseous abnormalities. Redemonstration of extensive soft tissue calcifications overlying bilateral iliac wings. Surgical changes, devices, tubes and lines: IVC filter noted overlying the L2-3 vertebral bodies on the right. There are surgical clips in the right upper quadrant, typical of a previous cholecystectomy. Enteric tube is seen with its tip and side hole overlying the left upper quadrant, within the distal stomach.  IMPRESSION: 1. Nonobstructive bowel gas pattern. Electronically Signed   By: Jules Schick M.D.   On: 12/09/2022 10:51    Anti-infectives: Anti-infectives (From admission, onward)    Start     Dose/Rate Route Frequency Ordered Stop   12/10/22 1045  cephALEXin (KEFLEX) capsule 500 mg        500 mg Oral Every 12 hours 12/10/22 0955 12/15/22 0959        Assessment/Plan Recurrent SBO -resolved quickly with contrast in her colon.  No bowel movement yet, but passing flatus -adv to soft diet today.  On daily miralax, will add  suppository as well to help get things moving. -we discussed the need for maintaining a low fiber diet at home at discharge to help minimize recurrence risk.  -discussed with primary service today.  If patient tolerates soft diet today and moves her bowels, she can likely DC home tomorrow.  FEN - soft, miralax VTE - heparin  ID - none currently needed  CAD CHF DM HTN H/O CVA  I reviewed hospitalist notes, last 24 h vitals and pain scores, last 48 h intake and output, last 24 h labs and trends, and last 24 h imaging results.   LOS: 3 days    Letha Cape , [facility] 12/11/2022, 9:54 AM Please see Amion for pager number during day hours 7:00am-4:30pm or 7:00am -11:30am on weekends

## 2022-12-11 NOTE — Progress Notes (Signed)
  Bedside nurse reported that patient's diet has been advanced currently she is on soft regular diet. - No requirement to check blood glucose every 4 hours now. -History of diabetes.  Continue to check POC blood glucose 3 times daily with meal and bedtime.  Continue SSI as needed coverage.  Tereasa Coop, MD [facility] 12/11/2022, 11:50 PM

## 2022-12-11 NOTE — Progress Notes (Signed)
Physical Therapy Treatment Patient Details Name: Katherine [name] MRN: [id number] DOB: May 22, 1930 Today's Date: 12/11/2022   History of Present Illness Patient is an 89 y/o female admitted 12/08/22 with abdominal pain with N&V with history of similar symptoms with SBO. NG tube placed for decompression in ED 10/7 evening and removed on 10/9. PMH positive for anemia, SBO, CHF, CKD, DM2, HH, ,HTN, PAF, stroke with R weakness, hysterectomy, appendectomy, cholecystecomy, and breast surgery.    PT Comments  Pt received in supine, agreeable to therapy session, bed found to be damp from urine on PTA arrival to room. Pt able to perform transfers and gait with up to minA and dense safety cues for transfers. Pt quick to fatigue and very reliant on support of RW this date, c/o nausea, RN notified. Pt continues to benefit from PT services to progress toward functional mobility goals.    If plan is discharge home, recommend the following: A little help with walking and/or transfers;Assist for transportation;Help with stairs or ramp for entrance;A little help with bathing/dressing/bathroom;Assistance with cooking/housework   Can travel by private vehicle     Yes  Equipment Recommendations  None recommended by PT    Recommendations for Other Services       Precautions / Restrictions Precautions Precautions: Fall Restrictions Weight Bearing Restrictions: No     Mobility  Bed Mobility Overal bed mobility: Needs Assistance Bed Mobility: Supine to Sit     Supine to sit: HOB elevated, Used rails, Contact guard     General bed mobility comments: cues for safety/use of bed features and rail to perform    Transfers Overall transfer level: Needs assistance Equipment used: Rolling walker (2 wheels) Transfers: Sit to/from Stand Sit to Stand: Min assist           General transfer comment: from EOB>RW with minA, mostly CGA for stand>sit but pt needs consistent safety cues to avoid sitting  prematurely and for hand placement on both arm rests.    Ambulation/Gait Ambulation/Gait assistance: Contact guard assist, Min assist Gait Distance (Feet): 20 Feet Assistive device: Rolling walker (2 wheels) Gait Pattern/deviations: Step-through pattern, Decreased stride length, Trunk flexed, Shuffle Gait velocity: grossly <0.3 m/s     General Gait Details: increased assist (minA) needed when turning or stepping backward and assit with RW intermittently, CGA for forward stepping mostly. Pt self-limiting distance due to c/o nausea and fatigue and defers additional gait trials; RN notified of pt nausea as NGT recently removed.   Stairs             Wheelchair Mobility     Tilt Bed    Modified Rankin (Stroke Patients Only)       Balance Overall balance assessment: Needs assistance Sitting-balance support: Feet supported Sitting balance-Leahy Scale: Good     Standing balance support: Bilateral upper extremity supported, Reliant on assistive device for balance Standing balance-Leahy Scale: Poor Standing balance comment: reliant on BUE support on RW, CGA to minA                            Cognition Arousal: Alert Behavior During Therapy: WFL for tasks assessed/performed Overall Cognitive Status: History of cognitive impairments - at baseline                                 General Comments: pt reports aphasia and word finding limitations at baseline. slow processing  and needs some safety cues for UE placement.        Exercises      General Comments General comments (skin integrity, edema, etc.): Bed saturated with urine on PTA arrival to room, pt reports baseline urinary incontinence, RN notified. Pt assisted to change gown and linens removed from bed, pt performed some upper body hygiene while on BSC and PTA assisted with posterior peri-care due to pt fatigue and decreased standing balance when unsupported. No acute s/sx distress other than  nausea/fatigue and RN notified she will need chair alarm, none on unit, RN/NT to order. Pt has call bell and able to repeat back use of call system.      Pertinent Vitals/Pain Pain Assessment Pain Assessment: Faces Faces Pain Scale: Hurts little more Pain Location: not localized, pt appears to guard at abdomen intermittently (c/o nausea) and NGT removed yesterday. Pain Descriptors / Indicators: Grimacing, Guarding, Other (Comment) (nausea) Pain Intervention(s): Limited activity within patient's tolerance, Monitored during session, Repositioned    Home Living                          Prior Function            PT Goals (current goals can now be found in the care plan section) Acute Rehab PT Goals Patient Stated Goal: get issue resolved, go home PT Goal Formulation: With patient/family Time For Goal Achievement: 12/23/22 Progress towards PT goals: Progressing toward goals    Frequency    Min 1X/week      PT Plan      Co-evaluation              AM-PAC PT "6 Clicks" Mobility   Outcome Measure  Help needed turning from your back to your side while in a flat bed without using bedrails?: A Little Help needed moving from lying on your back to sitting on the side of a flat bed without using bedrails?: A Little Help needed moving to and from a bed to a chair (including a wheelchair)?: A Little Help needed standing up from a chair using your arms (e.g., wheelchair or bedside chair)?: A Lot (mod safety cues) Help needed to walk in hospital room?: A Little Help needed climbing 3-5 steps with a railing? : Total 6 Click Score: 15    End of Session Equipment Utilized During Treatment: Gait belt Activity Tolerance: Patient limited by pain;Patient limited by fatigue Patient left: with call bell/phone within reach;in chair;Other (comment) (RN/NT aware they need to order chair alarm for her) Nurse Communication: Mobility status;Other (comment) (incontinence, needs chair  alarm and new sheets, nausea) PT Visit Diagnosis: Other abnormalities of gait and mobility (R26.89);Muscle weakness (generalized) (M62.81)     Time: 1109-1140 PT Time Calculation (min) (ACUTE ONLY): 31 min  Charges:    $Gait Training: 8-22 mins $Therapeutic Activity: 8-22 mins PT General Charges $$ ACUTE PT VISIT: 1 Visit                     Erick Oxendine P., PTA Acute Rehabilitation Services Secure Chat Preferred 9a-5:30pm Office: [phone]    Dorathy Kinsman [facility] 12/11/2022, 12:00 PM

## 2022-12-11 NOTE — Progress Notes (Addendum)
Progress Note   Patient: Katherine [name] [id number]:[id number] DOB: 1930/11/13 DOA: 12/08/2022     3 DOS: the patient was seen and examined on 12/11/2022   Brief hospital course: Taken from prior notes.   Katherine [name] is a 89 y.o. female with medical history significant for type 2 diabetes, hypertension, hyperlipidemia, paroxysmal A-fib not on oral anticoagulation, CVA on Plavix, coronary artery disease, chronic diastolic CHF, mild to moderate aortic stenosis, CKD stage IIIa, who presents from home with complaints of diffuse abdominal pain for the past 2 days.  Associated with nausea and vomiting.   On arrival to ED stable vitals, labs pertinent for bicarb of 16, BUN 28, creatinine 1.14 CT abdomen concerning for SBO with a transition point within the left lower abdomen.  General surgery was consulted and NG tube was placed in ED.  10/9: Vital stable.  UA today with concern of UTI with positive nitrites and leukocytes with many bacteria, pending urine culture, prior urine culture with history of Citrobacter resistant to ceftriaxone.  SBO protocol GI series with no evidence of obstruction, NG tube was pulled out and patient was started on clear liquid diet.  10/10: Hemodynamically stable, urine cultures with Klebsiella pneumonia.  Tolerating full liquid diet but still not passing any flatus or BM yet.  Bowel regimen was also added. PT is recommending SNF but patient wants to go back home with home health.  Also discussed with sister who also confirmed that patient does not want to go to any nursing home.  They do not live together but they keep checking on her.   Assessment and Plan: * SBO (small bowel obstruction) (HCC) General surgery is following, SBO protocol GI series with contrast in colon.  No more nausea or vomiting so NG tube was pulled out.  Still no BM. -Tolerating full liquid diet-it was advanced to soft by general surgery -Added bowel regimen -Continue with supportive care  UTI  (urinary tract infection) UA with concern of UTI.  Patient to have some dysuria and increased urinary urgency. -Urine cultures growing Klebsiella-pending susceptibility -Continue with Keflex  Essential hypertension Blood pressure mildly elevated. -Restarting Home amlodipine -As needed hydralazine  Chronic diastolic CHF (congestive heart failure) (HCC) Clinically appears euvolemic. -Currently holding home torsemide -Monitor volume status  Type 2 diabetes mellitus with complication, with long-term current use of insulin (HCC) A1c of 7.4 in January 2024 -SSI  CKD (chronic kidney disease) stage 3, GFR 30-59 ml/min (HCC) Appears to have CKD stage III A. Creatinine seems stable and at baseline -Monitor renal function -Avoid nephrotoxins  Paroxysmal atrial fibrillation (HCC) Not on any anticoagulation. -Continue to monitor   Subjective: Patient was sitting comfortably in chair when seen today.  No nausea, vomiting or pain.  No bowel movement yet.  Not passing any flatus but tolerating full liquid diet well.  Physical Exam: Vitals:   12/10/22 1609 12/10/22 1919 12/11/22 0346 12/11/22 0737  BP: (!) 162/73 (!) 122/54 (!) 130/56 134/66  Pulse: 60 63 65 63  Resp:  15 15   Temp: 98.7 F (37.1 C) 98.4 F (36.9 C) 98.2 F (36.8 C) 98.5 F (36.9 C)  TempSrc:  Oral Oral Oral  SpO2: 100% 100% 98% 99%  Weight:      Height:       General. Frail elderly lady, In no acute distress. Pulmonary.  Lungs clear bilaterally, normal respiratory effort. CV.  Regular rate and rhythm, no JVD, rub or murmur. Abdomen.  Soft, nontender, nondistended, BS positive. CNS.  Alert and oriented .  No focal neurologic deficit. Extremities.  No edema, no cyanosis, pulses intact and symmetrical.  Data Reviewed: Prior data reviewed  Family Communication: Talked with sister on phone.  Disposition: Status is: Inpatient Remains inpatient appropriate because:   Planned Discharge Destination: Home  DVT  prophylaxis.  Subcu heparin Time spent: 45 minutes  This record has been created using Conservation officer, historic buildings. Errors have been sought and corrected,but may not always be located. Such creation errors do not reflect on the standard of care.   Author: Arnetha Courser, MD 12/11/2022 3:10 PM  For on call review [email or web address].

## 2022-12-12 DIAGNOSIS — I10 Essential (primary) hypertension: Secondary | ICD-10-CM | POA: Diagnosis not present

## 2022-12-12 DIAGNOSIS — I5032 Chronic diastolic (congestive) heart failure: Secondary | ICD-10-CM | POA: Diagnosis not present

## 2022-12-12 DIAGNOSIS — N3000 Acute cystitis without hematuria: Secondary | ICD-10-CM | POA: Diagnosis not present

## 2022-12-12 DIAGNOSIS — K56609 Unspecified intestinal obstruction, unspecified as to partial versus complete obstruction: Secondary | ICD-10-CM | POA: Diagnosis not present

## 2022-12-12 LAB — GLUCOSE, CAPILLARY
Glucose-Capillary: 135 mg/dL — ABNORMAL HIGH (ref 70–99)
Glucose-Capillary: 187 mg/dL — ABNORMAL HIGH (ref 70–99)
Glucose-Capillary: 122 mg/dL — ABNORMAL HIGH (ref 70–99)
Glucose-Capillary: 145 mg/dL — ABNORMAL HIGH (ref 70–99)

## 2022-12-12 NOTE — Plan of Care (Signed)

## 2022-12-12 NOTE — Progress Notes (Signed)
Progress Note   Patient: Katherine [name] NWG:[id number] DOB: [birth year] DOA: 12/08/2022     4 DOS: the patient was seen and examined on 12/12/2022   Brief hospital course: Taken from prior notes.   Katherine [name] is a 89 y.o. female with medical history significant for type 2 diabetes, hypertension, hyperlipidemia, paroxysmal A-fib not on oral anticoagulation, CVA on Plavix, coronary artery disease, chronic diastolic CHF, mild to moderate aortic stenosis, CKD stage IIIa, who presents from home with complaints of diffuse abdominal pain for the past 2 days.  Associated with nausea and vomiting.   On arrival to ED stable vitals, labs pertinent for bicarb of 16, BUN 28, creatinine 1.14 CT abdomen concerning for SBO with a transition point within the left lower abdomen.  General surgery was consulted and NG tube was placed in ED.  10/9: Vital stable.  UA today with concern of UTI with positive nitrites and leukocytes with many bacteria, pending urine culture, prior urine culture with history of Citrobacter resistant to ceftriaxone.  SBO protocol GI series with no evidence of obstruction, NG tube was pulled out and patient was started on clear liquid diet.  10/10: Hemodynamically stable, urine cultures with Klebsiella pneumonia.  Tolerating full liquid diet but still not passing any flatus or BM yet.  Bowel regimen was also added. PT is recommending SNF but patient wants to go back home with home health.  Also discussed with sister who also confirmed that patient does not want to go to any nursing home.  They do not live together but they keep checking on her.  10/11: Had a small BM yesterday, tolerating soft diet.  Urine culture with Klebsiella pneumonia only resistant to ampicillin and intermediate to nitrofurantoin, continuing Keflex to complete a 5-day course.  Continue with bowel regimen, likely be discharged tomorrow as she does not want to go to rehab.  Home health ordered   Assessment and  Plan: * SBO (small bowel obstruction) (HCC) General surgery is following, SBO protocol GI series with contrast in colon.  No more nausea or vomiting so NG tube was pulled out.  Had a small BM yesterday, still having abdominal pain. -Tolerating soft diet -Added bowel regimen -Continue with supportive care  UTI (urinary tract infection) UA with concern of UTI.  Patient to have some dysuria and increased urinary urgency. -Urine cultures growing Klebsiella-only resistant to ampicillin and intermediate for nitrofurantoin -Continue with Keflex-will complete a 5-day course  Essential hypertension Blood pressure mildly elevated. -Restarting Home amlodipine -As needed hydralazine  Chronic diastolic CHF (congestive heart failure) (HCC) Clinically appears euvolemic. -Currently holding home torsemide -Monitor volume status  Type 2 diabetes mellitus with complication, with long-term current use of insulin (HCC) A1c of 7.4 in January 2024 -SSI  CKD (chronic kidney disease) stage 3, GFR 30-59 ml/min (HCC) Appears to have CKD stage III A. Creatinine seems stable and at baseline -Monitor renal function -Avoid nephrotoxins  Paroxysmal atrial fibrillation (HCC) Not on any anticoagulation. -Continue to monitor   Subjective: Patient had a small bowel movement yesterday.  Still having some lower abdominal pain.  Tolerating soft diet. She wants to go home instead of rehab.  Physical Exam: Vitals:   12/11/22 1608 12/11/22 1900 12/12/22 0436 12/12/22 0809  BP: 129/62 129/74 (!) 134/56 (!) 145/60  Pulse: 65  61 67  Resp:  19 18 16   Temp: 98.6 F (37 C) 98.9 F (37.2 C) 98.8 F (37.1 C) 99.1 F (37.3 C)  TempSrc:  Oral Oral Oral  SpO2: 100% 100% 97% 97%  Weight:      Height:       General. Frail elderly lady, in no acute distress. Pulmonary.  Lungs clear bilaterally, normal respiratory effort. CV.  Regular rate and rhythm, no JVD, rub or murmur. Abdomen.  Soft, nontender,  nondistended, BS positive. CNS.  Alert and oriented .  No focal neurologic deficit. Extremities.  No edema, no cyanosis, pulses intact and symmetrical. Psychiatry.  Appears to have some cognitive impairment.  Data Reviewed: Prior data reviewed  Family Communication:   Disposition: Status is: Inpatient Remains inpatient appropriate because:   Planned Discharge Destination: Home  DVT prophylaxis.  Subcu heparin Time spent: 40 minutes  This record has been created using Conservation officer, historic buildings. Errors have been sought and corrected,but may not always be located. Such creation errors do not reflect on the standard of care.   Author: Arnetha Courser, MD 12/12/2022 2:55 PM  For on call review [email or web address].

## 2022-12-12 NOTE — TOC Progression Note (Signed)
Transition of Care [facility]) - Progression Note    Patient Details  Name: [name] MRN: [id number] Date of Birth: 1930/12/21  Transition of Care [facility]) CM/SW Contact  Janae Bridgeman, RN Phone Number: 12/12/2022, 3:27 PM  Clinical Narrative:    CM met with the patient at the bedside to discuss TOC needs.  The patient admitted to the hospital with GI obstruction and will discharge home tomorrow when medically stable.  The patient lives at home alone and has active PCS Services through [place] 5 days a week.  The patient was offered Medicare choice regarding home health services and the patient declined home health services and states "I had home health in the past but do not want services".  The patient states that she will call her home health aide when she arrives home to continue services.  Patient requests PTAR transport home and states that she has a key to her residence.  MSW to follow the patient tomorrow for PTAR transport to home.   Expected Discharge Plan: Home w Home Health Services Barriers to Discharge: Continued Medical Work up  Expected Discharge Plan and Services In-house Referral: Clinical Social Work Discharge Planning Services: CM Consult Post Acute Care Choice: Resumption of Svcs/PTA Provider Living arrangements for the past 2 months: Apartment                                       Social Determinants of Health (SDOH) Interventions SDOH Screenings   Food Insecurity: No Food Insecurity (12/09/2022)  Housing: Low Risk  (12/09/2022)  Transportation Needs: No Transportation Needs (12/09/2022)  Utilities: Not At Risk (12/09/2022)  Tobacco Use: Medium Risk (12/08/2022)    Readmission Risk Interventions    12/12/2022    3:26 PM  Readmission Risk Prevention Plan  Transportation Screening Complete  PCP or Specialist Appt within 3-5 Days Complete  HRI or Home Care Consult Complete  Social Work Consult for Recovery Care Planning/Counseling  Complete  Palliative Care Screening Complete  Medication Review Oceanographer) Complete

## 2022-12-12 NOTE — Care Management Important Message (Signed)
Important Message  Patient Details  Name: Katherine [name] MRN: [id number] Date of Birth: 09/27/1930   Important Message Given:  Yes - Medicare IM     Dorena Bodo 12/12/2022, 3:10 PM

## 2022-12-12 NOTE — Progress Notes (Signed)
       Subjective: Patient tolerating soft diet.  Moved her bowels yesterday.  ROS: See above, otherwise other systems negative  Objective: Vital signs in last 24 hours: Temp:  [98.6 F (37 C)-99.1 F (37.3 C)] 99.1 F (37.3 C) (10/11 0809) Pulse Rate:  [61-67] 67 (10/11 0809) Resp:  [16-19] 16 (10/11 0809) BP: (129-145)/(56-74) 145/60 (10/11 0809) SpO2:  [97 %-100 %] 97 % (10/11 0809) Last BM Date : 12/11/22  Intake/Output from previous day: 10/10 0701 - 10/11 0700 In: 120 [P.O.:120] Out: -  Intake/Output this shift: No intake/output data recorded.  PE: Gen: NAD Abd: soft, some diffuse tenderness as consistent with previous exams, ND  Lab Results:  Recent Labs    12/10/22 0443  WBC 5.3  HGB 10.6*  HCT 33.7*  PLT 246   BMET Recent Labs    12/10/22 0443  NA 145  K 4.1  CL 112*  CO2 24  GLUCOSE 92  BUN 19  CREATININE 1.04*  CALCIUM 9.5   PT/INR No results for input(s): "LABPROT", "INR" in the last 72 hours. CMP     Component Value Date/Time   NA 145 12/10/2022 0443   K 4.1 12/10/2022 0443   CL 112 (H) 12/10/2022 0443   CO2 24 12/10/2022 0443   GLUCOSE 92 12/10/2022 0443   BUN 19 12/10/2022 0443   CREATININE 1.04 (H) 12/10/2022 0443   CALCIUM 9.5 12/10/2022 0443   PROT 7.0 12/08/2022 2241   ALBUMIN 3.8 12/08/2022 2241   AST 15 12/08/2022 2241   ALT 10 12/08/2022 2241   ALKPHOS 81 12/08/2022 2241   BILITOT 0.5 12/08/2022 2241   GFRNONAA 51 (L) 12/10/2022 0443   GFRAA 35 (L) 05/08/2019 2043   Lipase     Component Value Date/Time   LIPASE 28 11/04/2022 1326       Studies/Results: No results found.  Anti-infectives: Anti-infectives (From admission, onward)    Start     Dose/Rate Route Frequency Ordered Stop   12/10/22 1045  cephALEXin (KEFLEX) capsule 500 mg        500 mg Oral Every 12 hours 12/10/22 0955 12/15/22 0959        Assessment/Plan Recurrent SBO -resolved quickly with contrast in her colon.  + BM and some  flatus. -tolerating soft diet.  On daily miralax, will add suppository as well to help get things moving. -we discussed the need for maintaining a low fiber diet at home at discharge to help minimize recurrence risk.  -surgically stable for Dc home.  We will sign off at this time  FEN - soft, miralax VTE - heparin  ID - none currently needed  CAD CHF DM HTN H/O CVA  I reviewed hospitalist notes, last 24 h vitals and pain scores, last 48 h intake and output, last 24 h labs and trends, and last 24 h imaging results.   LOS: 4 days    Letha Cape , [facility] Surgery 12/12/2022, 10:49 AM Please see Amion for pager number during day hours 7:00am-4:30pm or 7:00am -11:30am on weekends

## 2022-12-13 DIAGNOSIS — K56609 Unspecified intestinal obstruction, unspecified as to partial versus complete obstruction: Secondary | ICD-10-CM | POA: Diagnosis not present

## 2022-12-13 DIAGNOSIS — I10 Essential (primary) hypertension: Secondary | ICD-10-CM | POA: Diagnosis not present

## 2022-12-13 DIAGNOSIS — I5032 Chronic diastolic (congestive) heart failure: Secondary | ICD-10-CM | POA: Diagnosis not present

## 2022-12-13 DIAGNOSIS — N3000 Acute cystitis without hematuria: Secondary | ICD-10-CM | POA: Diagnosis not present

## 2022-12-13 LAB — GLUCOSE, CAPILLARY
Glucose-Capillary: 120 mg/dL — ABNORMAL HIGH (ref 70–99)
Glucose-Capillary: 145 mg/dL — ABNORMAL HIGH (ref 70–99)
Glucose-Capillary: 147 mg/dL — ABNORMAL HIGH (ref 70–99)

## 2022-12-13 LAB — URINE CULTURE: Culture: >=100000 — AB

## 2022-12-13 MED ORDER — CEPHALEXIN 500 MG PO CAPS: 500.0000 mg | ORAL_CAPSULE | Freq: Two times a day (BID) | ORAL | 0 refills | Status: AC

## 2022-12-13 MED ORDER — POLYETHYLENE GLYCOL 3350 17 G PO PACK: 17.0000 g | PACK | Freq: Every day | ORAL | 0 refills | Status: AC

## 2022-12-13 NOTE — Plan of Care (Signed)
Pt Discharge to PTAR for transport  BP (!) 140/56 (BP Location: Left Arm)   Pulse 60   Temp 98.2 F (36.8 C) (Oral)   Resp 18   Ht 5\' 7"  (1.702 m)   Wt 73.5 kg   SpO2 97%   BMI 25.37 kg/m  AVS reviewed , followed up questions answered  IV removed all belongings returned  and given to PTAR.  No c/o N/V/Diarrhea  Reva Bores 12/13/22 11:07 AM

## 2022-12-13 NOTE — Discharge Summary (Signed)
Physician Discharge Summary   Patient: Katherine [name] MRN: [id number] DOB: [birth year]  Admit date:     12/08/2022  Discharge date: 12/13/22  Discharge Physician: Arnetha Courser   PCP: Rinaldo Cloud, MD   Recommendations at discharge:  Please obtain CBC and BMP and follow-up Follow-up with primary care provider  Discharge Diagnoses: Principal Problem:   SBO (small bowel obstruction) (HCC) Active Problems:   UTI (urinary tract infection)   Essential hypertension   Chronic diastolic CHF (congestive heart failure) (HCC)   Type 2 diabetes mellitus with complication, with long-term current use of insulin (HCC)   CKD (chronic kidney disease) stage 3, GFR 30-59 ml/min (HCC)   Paroxysmal atrial fibrillation (HCC)  Resolved Problems:   * No resolved hospital problems. [facility] Course: Taken from prior notes.   Katherine [name] is a 89 y.o. female with medical history significant for type 2 diabetes, hypertension, hyperlipidemia, paroxysmal A-fib not on oral anticoagulation, CVA on Plavix, coronary artery disease, chronic diastolic CHF, mild to moderate aortic stenosis, CKD stage IIIa, who presents from home with complaints of diffuse abdominal pain for the past 2 days.  Associated with nausea and vomiting.   On arrival to ED stable vitals, labs pertinent for bicarb of 16, BUN 28, creatinine 1.14 CT abdomen concerning for SBO with a transition point within the left lower abdomen.  General surgery was consulted and NG tube was placed in ED.  10/9: Vital stable.  UA today with concern of UTI with positive nitrites and leukocytes with many bacteria, pending urine culture, prior urine culture with history of Citrobacter resistant to ceftriaxone.  SBO protocol GI series with no evidence of obstruction, NG tube was pulled out and patient was started on clear liquid diet.  10/10: Hemodynamically stable, urine cultures with Klebsiella pneumonia.  Tolerating full liquid diet but still not passing any  flatus or BM yet.  Bowel regimen was also added. PT is recommending SNF but patient wants to go back home with home health.  Also discussed with sister who also confirmed that patient does not want to go to any nursing home.  They do not live together but they keep checking on her.  10/11: Had a small BM yesterday, tolerating soft diet.  Urine culture with Klebsiella pneumonia only resistant to ampicillin and intermediate to nitrofurantoin, continuing Keflex to complete a 5-day course.  Continue with bowel regimen, likely be discharged tomorrow as she does not want to go to rehab.  Home health ordered.  10/12: Patient remained hemodynamically stable, having bowel movement and tolerating diet. Patient was also refusing home health services stating that she had an aide who can provide proper assistance.  TOC is on board. Patient was given 3 more doses of Keflex to complete the course for UTI.  She will continue with current home medications.  Patient should encourage p.o. hydration and avoiding constipation.  She should be using MiraLAX regularly as needed.  She will continue on her current medications and follow-up with her providers closely for further management.  Assessment and Plan: * SBO (small bowel obstruction) (HCC) General surgery is following, SBO protocol GI series with contrast in colon.  No more nausea or vomiting so NG tube was pulled out.  Had a small BM yesterday, still having abdominal pain. -Tolerating soft diet -Added bowel regimen -Continue with supportive care  UTI (urinary tract infection) UA with concern of UTI.  Patient to have some dysuria and increased urinary urgency. -Urine cultures growing Klebsiella-only resistant  to ampicillin and intermediate for nitrofurantoin -Continue with Keflex-will complete a 5-day course  Essential hypertension Blood pressure mildly elevated. -Restarting Home amlodipine -As needed hydralazine  Chronic diastolic CHF (congestive heart  failure) (HCC) Clinically appears euvolemic. -Currently holding home torsemide -Monitor volume status  Type 2 diabetes mellitus with complication, with long-term current use of insulin (HCC) A1c of 7.4 in January 2024 -SSI  CKD (chronic kidney disease) stage 3, GFR 30-59 ml/min (HCC) Appears to have CKD stage III A. Creatinine seems stable and at baseline -Monitor renal function -Avoid nephrotoxins  Paroxysmal atrial fibrillation (HCC) Not on any anticoagulation. -Continue to monitor   Consultants: General Surgery Procedures performed: None Disposition: Home Diet recommendation:  Discharge Diet Orders (From admission, onward)     Start     Ordered   12/13/22 0000  Diet - low sodium heart healthy        12/13/22 1042           Cardiac and Carb modified diet DISCHARGE MEDICATION: Allergies as of 12/13/2022       Reactions   Asa [aspirin] Hives   Blue Dyes (parenteral) Hives   Motrin [ibuprofen] Hives   Contrast Media [iodinated Contrast Media] Other (See Comments)   Unknown reaction   Iohexol Other (See Comments)   Unknown reaction   Shellfish Allergy Hives        Medication List     TAKE these medications    Accu-Chek Guide test strip Generic drug: glucose blood 1 each by Other route 2 (two) times daily.   allopurinol 100 MG tablet Commonly known as: ZYLOPRIM Take 100 mg by mouth in the morning.   amLODipine 5 MG tablet Commonly known as: NORVASC Take 5 mg by mouth daily.   atorvastatin 40 MG tablet Commonly known as: LIPITOR Take 40 mg by mouth in the morning.   brimonidine 0.1 % Soln Commonly known as: ALPHAGAN P Place 1 drop into both eyes in the morning and at bedtime.   cephALEXin 500 MG capsule Commonly known as: KEFLEX Take 1 capsule (500 mg total) by mouth every 12 (twelve) hours for 3 doses.   clopidogrel 75 MG tablet Commonly known as: PLAVIX Take 1 tablet (75 mg total) by mouth daily.   dicyclomine 10 MG capsule Commonly  known as: BENTYL Take 10 mg by mouth 2 (two) times daily as needed for spasms.   diphenhydrAMINE 25 mg capsule Commonly known as: BENADRYL Take 25 mg by mouth every 6 (six) hours as needed for allergies or itching.   dorzolamide 2 % ophthalmic solution Commonly known as: TRUSOPT Place 1 drop into both eyes in the morning.   ergocalciferol 1.25 MG (50000 UT) capsule Commonly known as: VITAMIN D2 Take 50,000 Units by mouth every Saturday.   fentaNYL 12 MCG/HR Commonly known as: DURAGESIC Place 1 patch onto the skin every 3 (three) days.   nitroGLYCERIN 0.4 mg/hr patch Commonly known as: NITRODUR - Dosed in mg/24 hr Place 0.4 mg onto the skin in the morning.   omeprazole 40 MG capsule Commonly known as: PRILOSEC Take 1 capsule (40 mg total) by mouth daily.   ondansetron 4 MG tablet Commonly known as: ZOFRAN Take 1 tablet (4 mg total) by mouth every 6 (six) hours as needed for nausea.   polyethylene glycol 17 g packet Commonly known as: MIRALAX / GLYCOLAX Take 17 g by mouth daily.   Rocklatan 0.02-0.005 % Soln Generic drug: Netarsudil-Latanoprost Place 1 drop into both eyes at bedtime.   torsemide  20 MG tablet Commonly known as: DEMADEX Take 1 tablet (20 mg total) by mouth daily.        Follow-up Information     Rinaldo Cloud, MD. Schedule an appointment as soon as possible for a visit in 1 week(s).   Specialty: Cardiology Contact information: [address] [place] [address] [phone]                Discharge Exam: Ceasar Mons Weights   12/08/22 2104  Weight: 73.5 kg   General.  Frail elderly lady, in no acute distress. Pulmonary.  Lungs clear bilaterally, normal respiratory effort. CV.  Regular rate and rhythm, no JVD, rub or murmur. Abdomen.  Soft, nontender, nondistended, BS positive. CNS.  Alert and oriented .  No focal neurologic deficit. Extremities.  No edema, no cyanosis, pulses intact and symmetrical.   Condition at  discharge: stable  The results of significant diagnostics from this hospitalization (including imaging, microbiology, ancillary and laboratory) are listed below for reference.   Imaging Studies: DG Abd Portable 1V-Small Bowel Obstruction Protocol-24 hr delay  Result Date: 12/10/2022 CLINICAL DATA:  Confirm NG tube placement. Small-bowel obstruction protocol. EXAM: PORTABLE ABDOMEN - 1 VIEW COMPARISON:  12/09/2022. FINDINGS: The bowel gas pattern is normal. Contrast is identified within the colon. The side port of an enteric tube is above the level of the diaphragm and should be advanced a proximally 9 cm. Surgical clips are noted in the right upper quadrant. An IVC filter is present. Stable calcifications are noted over the pelvis bilaterally. IMPRESSION: 1. No evidence of bowel obstruction. 2. Side port of the enteric tube is above the level of the diaphragm and should be advanced 9 cm. Electronically Signed   By: Thornell Sartorius M.D.   On: 12/10/2022 03:41   DG Abd Portable 1V-Small Bowel Obstruction Protocol-initial, 8 hr delay  Result Date: 12/09/2022 CLINICAL DATA:  Small-bowel obstruction.  8 hour delayed film. EXAM: PORTABLE ABDOMEN - 1 VIEW COMPARISON:  12/09/2022, 12:10 a.m. FINDINGS: The bowel gas pattern is non-obstructive. There is paucity of bowel gas. No evidence of pneumoperitoneum, within the limitations of a supine film. No acute osseous abnormalities. Redemonstration of extensive soft tissue calcifications overlying bilateral iliac wings. Surgical changes, devices, tubes and lines: IVC filter noted overlying the L2-3 vertebral bodies on the right. There are surgical clips in the right upper quadrant, typical of a previous cholecystectomy. Enteric tube is seen with its tip and side hole overlying the left upper quadrant, within the distal stomach. IMPRESSION: 1. Nonobstructive bowel gas pattern. Electronically Signed   By: Jules Schick M.D.   On: 12/09/2022 10:51   DG Abd Portable  1V-Small Bowel Protocol-Position Verification  Result Date: 12/09/2022 CLINICAL DATA:  NG placement. EXAM: PORTABLE ABDOMEN - 1 VIEW COMPARISON:  CT abdomen pelvis dated 12/08/2022. FINDINGS: Enteric tube with tip over the upper abdomen likely in the body of the stomach. Right upper quadrant cholecystectomy clips. No acute osseous pathology. IMPRESSION: Enteric tube with tip over the body of the stomach. Electronically Signed   By: Elgie Collard M.D.   On: 12/09/2022 00:51   CT ABDOMEN PELVIS WO CONTRAST  Result Date: 12/08/2022 CLINICAL DATA:  Bowel obstruction suspected EXAM: CT ABDOMEN AND PELVIS WITHOUT CONTRAST TECHNIQUE: Multidetector CT imaging of the abdomen and pelvis was performed following the standard protocol without IV contrast. RADIATION DOSE REDUCTION: This exam was performed according to the departmental dose-optimization program which includes automated exposure control, adjustment of the mA and/or kV  according to patient size and/or use of iterative reconstruction technique. COMPARISON:  CT abdomen pelvis 11/04/2022 FINDINGS: Lower chest: Coronary artery calcification.  Small hiatal hernia. Hepatobiliary: No focal liver abnormality. Status post cholecystectomy. No biliary dilatation. Pancreas: Diffusely atrophic. No focal lesion. Otherwise normal pancreatic contour. No surrounding inflammatory changes. No main pancreatic ductal dilatation. Spleen: Normal in size without focal abnormality. Adrenals/Urinary Tract: No adrenal nodule bilaterally. No nephrolithiasis and no hydronephrosis. No definite contour-deforming renal mass. No ureterolithiasis or hydroureter. The urinary bladder is unremarkable. Stomach/Bowel: Stomach is within normal limits. Interval worsening of short segment small bowel within the mid left abdomen dilatation with fluid and fecalized material measuring up to 3.5 cm in caliber. Suggestion of a transition point within the left lower abdomen (2:48, 5:63). No pneumatosis.  No evidence of large bowel wall thickening or dilatation. The appendix is not definitely identified with no inflammatory changes in the right lower quadrant to suggest acute appendicitis. Vascular/Lymphatic: Inferior vena cava filter in grossly appropriate position. No abdominal aorta or iliac aneurysm. Severe atherosclerotic plaque of the aorta and its branches. No abdominal, pelvic, or inguinal lymphadenopathy. Reproductive: Status post hysterectomy. No adnexal masses. Other: No intraperitoneal free fluid. No intraperitoneal free gas. No organized fluid collection. Musculoskeletal: No abdominal wall hernia or abnormality. Calcified injection granulomas within the gluteal soft tissues. No suspicious lytic or blastic osseous lesions. No acute displaced fracture. Multilevel degenerative changes of the spine. IMPRESSION: 1. Interval worsening of short segment mid left abdomen small bowel dilatation with fluid and fecalized material measuring up to 3.5 cm in caliber.Suggestion of a transition point within the left lower abdomen; however, limited evaluation on this noncontrast study. Finding suggestive of a small-bowel obstruction. 2. Small hiatal hernia. 3. Aortic Atherosclerosis (ICD10-I70.0). Electronically Signed   By: Tish Frederickson M.D.   On: 12/08/2022 23:39    Microbiology: Results for orders placed or performed during the hospital encounter of 12/08/22  Urine Culture     Status: Abnormal   Collection Time: 12/10/22  6:49 AM   Specimen: Urine, Random  Result Value Ref Range Status   Specimen Description URINE, RANDOM  Final   Special Requests NONE Reflexed from [id number]  Final   Culture (A)  Final    >=100,000 COLONIES/mL KLEBSIELLA PNEUMONIAE >=100,000 COLONIES/mL AEROCOCCUS SANGUINICOLA Standardized susceptibility testing for this organism is not available. Performed at [facility], [address], [place], [place] [address]    Report Status 12/13/2022 FINAL  Final   Organism ID,  Bacteria KLEBSIELLA PNEUMONIAE (A)  Final      Susceptibility   Klebsiella pneumoniae - MIC*    AMPICILLIN >=32 RESISTANT Resistant     CEFAZOLIN <=4 SENSITIVE Sensitive     CEFEPIME <=0.12 SENSITIVE Sensitive     CEFTRIAXONE <=0.25 SENSITIVE Sensitive     CIPROFLOXACIN <=0.25 SENSITIVE Sensitive     GENTAMICIN <=1 SENSITIVE Sensitive     IMIPENEM <=0.25 SENSITIVE Sensitive     NITROFURANTOIN 64 INTERMEDIATE Intermediate     TRIMETH/SULFA <=20 SENSITIVE Sensitive     AMPICILLIN/SULBACTAM 4 SENSITIVE Sensitive     PIP/TAZO <=4 SENSITIVE Sensitive ug/mL    * >=100,000 COLONIES/mL KLEBSIELLA PNEUMONIAE    Labs: CBC: Recent Labs  Lab 12/08/22 2241 12/09/22 0235 12/10/22 0443  WBC 8.2 6.5 5.3  NEUTROABS 7.1  --   --   HGB 12.3 12.8 10.6*  HCT 42.0 39.5 33.7*  MCV 96.3 86.8 91.6  PLT 151 226 246   Basic Metabolic Panel: Recent Labs  Lab  12/08/22 2241 12/09/22 0235 12/10/22 0443  NA 140 140 145  K 3.6 3.8 4.1  CL 107 102 112*  CO2 16* 21* 24  GLUCOSE 179* 140* 92  BUN 28* 25* 19  CREATININE 1.14* 1.04* 1.04*  CALCIUM 9.5 9.7 9.5  MG  --  1.6* 2.2  PHOS  --  3.4  --    Liver Function Tests: Recent Labs  Lab 12/08/22 2241  AST 15  ALT 10  ALKPHOS 81  BILITOT 0.5  PROT 7.0  ALBUMIN 3.8   CBG: Recent Labs  Lab 12/12/22 0641 12/12/22 1137 12/12/22 1601 12/12/22 2136 12/13/22 0746  GLUCAP 135* 187* 122* 145* 120*    Discharge time spent: greater than 30 minutes.  This record has been created using Conservation officer, historic buildings. Errors have been sought and corrected,but may not always be located. Such creation errors do not reflect on the standard of care.   Signed: Arnetha Courser, MD [facility] 12/13/2022

## 2022-12-13 NOTE — TOC Transition Note (Signed)
Transition of Care [facility]) - CM/SW Discharge Note   Patient Details  Name: Katherine [name] MRN: [id number] Date of Birth: [birth year]  [facility]) CM/SW Contact:  Lawerance Sabal, RN Phone Number: 12/13/2022, 11:03 AM   Clinical Narrative:     Sherron Monday w patient over the phone, verified address. She will need PTAR for transport. Forms have been tubed to unit, nurse aware that set up for around 2pm.  Requested nurse to call patient's family when PTAR arrives so they can meet her at the house with her key, patient states it will only take them 15 minutes to get to her place once called.    Final next level of care: Home w Home Health Services (patient declined skilled home health and only wants personal care services that she is currently active with) Barriers to Discharge: Continued Medical Work up   Patient Goals and CMS Choice CMS Medicare.gov Compare Post Acute Care list provided to:: Patient Choice offered to / list presented to : Patient  Discharge Placement                         Discharge Plan and Services Additional resources added to the After Visit Summary for   In-house Referral: Clinical Social Work Discharge Planning Services: CM Consult Post Acute Care Choice: Resumption of Svcs/PTA Provider                               Social Determinants of Health (SDOH) Interventions SDOH Screenings   Food Insecurity: No Food Insecurity (12/09/2022)  Housing: Low Risk  (12/09/2022)  Transportation Needs: No Transportation Needs (12/09/2022)  Utilities: Not At Risk (12/09/2022)  Tobacco Use: Medium Risk (12/08/2022)     Readmission Risk Interventions    12/12/2022    3:26 PM  Readmission Risk Prevention Plan  Transportation Screening Complete  PCP or Specialist Appt within 3-5 Days Complete  HRI or Home Care Consult Complete  Social Work Consult for Recovery Care Planning/Counseling Complete  Palliative Care Screening Complete  Medication Review  Oceanographer) Complete

## 2024-01-19 ENCOUNTER — Other Ambulatory Visit: Payer: Self-pay | Admitting: Internal Medicine

## 2024-01-19 DIAGNOSIS — Z1231 Encounter for screening mammogram for malignant neoplasm of breast: Secondary | ICD-10-CM

## 2024-01-21 ENCOUNTER — Other Ambulatory Visit: Payer: Self-pay | Admitting: Internal Medicine

## 2024-01-21 DIAGNOSIS — I6521 Occlusion and stenosis of right carotid artery: Secondary | ICD-10-CM

## 2024-02-23 ENCOUNTER — Ambulatory Visit
Admission: RE | Admit: 2024-02-23 | Discharge: 2024-02-23 | Disposition: A | Discharge status: Home / Self Care | Source: Ambulatory Visit | Attending: Internal Medicine | Admitting: Internal Medicine

## 2024-02-23 DIAGNOSIS — Z1231 Encounter for screening mammogram for malignant neoplasm of breast: Secondary | ICD-10-CM
